# Patient Record
Sex: Male | Born: 1940
Health system: Southern US, Community
[De-identification: ages and names within clinical notes are randomized; demographics above are authoritative.]

## PROBLEM LIST (undated history)

## (undated) DIAGNOSIS — R011 Cardiac murmur, unspecified: Secondary | ICD-10-CM

## (undated) DIAGNOSIS — IMO0001 Reserved for inherently not codable concepts without codable children: Secondary | ICD-10-CM

## (undated) DIAGNOSIS — M47812 Spondylosis without myelopathy or radiculopathy, cervical region: Principal | ICD-10-CM

## (undated) DIAGNOSIS — H9190 Unspecified hearing loss, unspecified ear: Secondary | ICD-10-CM

## (undated) DIAGNOSIS — A809 Acute poliomyelitis, unspecified: Secondary | ICD-10-CM

## (undated) DIAGNOSIS — E785 Hyperlipidemia, unspecified: Secondary | ICD-10-CM

## (undated) DIAGNOSIS — R350 Frequency of micturition: Secondary | ICD-10-CM

## (undated) DIAGNOSIS — I10 Essential (primary) hypertension: Secondary | ICD-10-CM

## (undated) DIAGNOSIS — Z87442 Personal history of urinary calculi: Secondary | ICD-10-CM

## (undated) DIAGNOSIS — N400 Enlarged prostate without lower urinary tract symptoms: Secondary | ICD-10-CM

## (undated) DIAGNOSIS — M545 Low back pain: Secondary | ICD-10-CM

## (undated) DIAGNOSIS — J189 Pneumonia, unspecified organism: Secondary | ICD-10-CM

## (undated) HISTORY — DX: Low back pain: M54.5

## (undated) HISTORY — DX: Unspecified hearing loss, unspecified ear: H91.90

## (undated) HISTORY — DX: Spondylosis without myelopathy or radiculopathy, cervical region: M47.812

---

## 1998-03-06 ENCOUNTER — Observation Stay (HOSPITAL_COMMUNITY): Admission: EM | Admit: 1998-03-06 | Discharge: 1998-03-06 | Payer: Self-pay | Admitting: Emergency Medicine

## 1998-03-08 ENCOUNTER — Ambulatory Visit (HOSPITAL_COMMUNITY): Admission: RE | Admit: 1998-03-08 | Discharge: 1998-03-08 | Payer: Self-pay | Admitting: Urology

## 1998-06-20 ENCOUNTER — Ambulatory Visit (HOSPITAL_COMMUNITY): Admission: RE | Admit: 1998-06-20 | Discharge: 1998-06-20 | Payer: Self-pay | Admitting: Family Medicine

## 1999-07-09 ENCOUNTER — Encounter: Payer: Self-pay | Admitting: Family Medicine

## 1999-07-09 ENCOUNTER — Ambulatory Visit (HOSPITAL_COMMUNITY): Admission: RE | Admit: 1999-07-09 | Discharge: 1999-07-09 | Payer: Self-pay | Admitting: Family Medicine

## 2000-07-02 ENCOUNTER — Encounter: Admission: RE | Admit: 2000-07-02 | Discharge: 2000-07-02 | Payer: Self-pay | Admitting: Urology

## 2000-07-02 ENCOUNTER — Encounter: Payer: Self-pay | Admitting: Urology

## 2000-12-17 ENCOUNTER — Encounter: Admission: RE | Admit: 2000-12-17 | Discharge: 2000-12-17 | Payer: Self-pay | Admitting: *Deleted

## 2000-12-17 ENCOUNTER — Encounter: Payer: Self-pay | Admitting: *Deleted

## 2002-04-21 ENCOUNTER — Encounter: Payer: Self-pay | Admitting: Family Medicine

## 2002-04-21 ENCOUNTER — Ambulatory Visit (HOSPITAL_COMMUNITY): Admission: RE | Admit: 2002-04-21 | Discharge: 2002-04-21 | Payer: Self-pay | Admitting: Family Medicine

## 2002-05-02 ENCOUNTER — Ambulatory Visit (HOSPITAL_COMMUNITY): Admission: RE | Admit: 2002-05-02 | Discharge: 2002-05-02 | Payer: Self-pay | Admitting: Family Medicine

## 2002-05-02 ENCOUNTER — Encounter: Payer: Self-pay | Admitting: Family Medicine

## 2004-02-25 ENCOUNTER — Emergency Department (HOSPITAL_COMMUNITY): Admission: EM | Admit: 2004-02-25 | Discharge: 2004-02-25 | Payer: Self-pay | Admitting: Emergency Medicine

## 2004-02-28 ENCOUNTER — Emergency Department (HOSPITAL_COMMUNITY): Admission: EM | Admit: 2004-02-28 | Discharge: 2004-02-28 | Payer: Self-pay | Admitting: Family Medicine

## 2006-04-29 ENCOUNTER — Encounter: Admission: RE | Admit: 2006-04-29 | Discharge: 2006-04-29 | Payer: Self-pay | Admitting: Family Medicine

## 2007-04-28 ENCOUNTER — Encounter (INDEPENDENT_AMBULATORY_CARE_PROVIDER_SITE_OTHER): Payer: Self-pay | Admitting: General Surgery

## 2007-04-28 ENCOUNTER — Ambulatory Visit (HOSPITAL_BASED_OUTPATIENT_CLINIC_OR_DEPARTMENT_OTHER): Admission: RE | Admit: 2007-04-28 | Discharge: 2007-04-28 | Payer: Self-pay | Admitting: General Surgery

## 2008-12-04 ENCOUNTER — Encounter: Admission: RE | Admit: 2008-12-04 | Discharge: 2008-12-04 | Payer: Self-pay | Admitting: Neurology

## 2010-07-15 ENCOUNTER — Encounter: Admission: RE | Admit: 2010-07-15 | Discharge: 2010-07-15 | Payer: Self-pay | Admitting: Orthopedic Surgery

## 2010-09-10 ENCOUNTER — Emergency Department (HOSPITAL_COMMUNITY)
Admission: EM | Admit: 2010-09-10 | Discharge: 2010-09-10 | Payer: Self-pay | Source: Home / Self Care | Admitting: Family Medicine

## 2010-11-24 ENCOUNTER — Encounter: Payer: Self-pay | Admitting: Neurology

## 2011-01-14 LAB — POCT URINALYSIS DIPSTICK
Bilirubin Urine: NEGATIVE
Glucose, UA: NEGATIVE mg/dL
Hgb urine dipstick: NEGATIVE
Ketones, ur: NEGATIVE mg/dL
Nitrite: NEGATIVE
Protein, ur: NEGATIVE mg/dL
Specific Gravity, Urine: 1.015 (ref 1.005–1.030)
Urobilinogen, UA: 0.2 mg/dL (ref 0.0–1.0)
pH: 5 (ref 5.0–8.0)

## 2011-02-14 ENCOUNTER — Inpatient Hospital Stay (INDEPENDENT_AMBULATORY_CARE_PROVIDER_SITE_OTHER)
Admission: RE | Admit: 2011-02-14 | Discharge: 2011-02-14 | Disposition: A | Discharge status: Home / Self Care | Payer: MEDICARE | Source: Ambulatory Visit | Attending: Emergency Medicine | Admitting: Emergency Medicine

## 2011-02-14 DIAGNOSIS — J069 Acute upper respiratory infection, unspecified: Secondary | ICD-10-CM

## 2011-03-21 ENCOUNTER — Inpatient Hospital Stay (INDEPENDENT_AMBULATORY_CARE_PROVIDER_SITE_OTHER)
Admission: RE | Admit: 2011-03-21 | Discharge: 2011-03-21 | Disposition: A | Discharge status: Home / Self Care | Payer: MEDICARE | Source: Ambulatory Visit | Attending: Family Medicine | Admitting: Family Medicine

## 2011-03-21 ENCOUNTER — Ambulatory Visit (INDEPENDENT_AMBULATORY_CARE_PROVIDER_SITE_OTHER): Payer: MEDICARE

## 2011-03-21 DIAGNOSIS — M79609 Pain in unspecified limb: Secondary | ICD-10-CM

## 2011-03-21 LAB — CBC
HCT: 40.9 % (ref 39.0–52.0)
Hemoglobin: 14 g/dL (ref 13.0–17.0)
MCH: 33.3 pg (ref 26.0–34.0)
MCHC: 34.2 g/dL (ref 30.0–36.0)
MCV: 97.4 fL (ref 78.0–100.0)
Platelets: 131 10*3/uL — ABNORMAL LOW (ref 150–400)
RBC: 4.2 MIL/uL — ABNORMAL LOW (ref 4.22–5.81)
RDW: 12.7 % (ref 11.5–15.5)
WBC: 5.1 10*3/uL (ref 4.0–10.5)

## 2011-03-21 LAB — DIFFERENTIAL
Basophils Absolute: 0 10*3/uL (ref 0.0–0.1)
Basophils Relative: 0 % (ref 0–1)
Eosinophils Absolute: 0.1 10*3/uL (ref 0.0–0.7)
Eosinophils Relative: 2 % (ref 0–5)
Lymphocytes Relative: 27 % (ref 12–46)
Lymphs Abs: 1.4 10*3/uL (ref 0.7–4.0)
Monocytes Absolute: 0.4 10*3/uL (ref 0.1–1.0)
Monocytes Relative: 8 % (ref 3–12)
Neutro Abs: 3.2 10*3/uL (ref 1.7–7.7)
Neutrophils Relative %: 63 % (ref 43–77)

## 2011-03-21 LAB — C-REACTIVE PROTEIN: CRP: 0.1 mg/dL — ABNORMAL LOW (ref <0.6)

## 2011-03-21 LAB — SEDIMENTATION RATE: Sed Rate: 2 mm/hr (ref 0–16)

## 2011-03-28 ENCOUNTER — Other Ambulatory Visit: Payer: Self-pay | Admitting: Sports Medicine

## 2011-03-28 DIAGNOSIS — M79671 Pain in right foot: Secondary | ICD-10-CM

## 2011-04-01 ENCOUNTER — Ambulatory Visit
Admission: RE | Admit: 2011-04-01 | Discharge: 2011-04-01 | Disposition: A | Discharge status: Home / Self Care | Payer: MEDICARE | Source: Ambulatory Visit | Attending: Sports Medicine | Admitting: Sports Medicine

## 2011-04-01 DIAGNOSIS — M79671 Pain in right foot: Secondary | ICD-10-CM

## 2011-04-01 MED ORDER — GADOBENATE DIMEGLUMINE 529 MG/ML IV SOLN: 10.0000 mL | Freq: Once | INTRAVENOUS | Status: AC | PRN

## 2011-06-24 ENCOUNTER — Other Ambulatory Visit: Payer: Self-pay | Admitting: Dermatology

## 2012-01-01 ENCOUNTER — Ambulatory Visit: Payer: Medicare Other | Admitting: Family Medicine

## 2012-07-06 ENCOUNTER — Encounter (HOSPITAL_COMMUNITY): Payer: Self-pay | Admitting: *Deleted

## 2012-07-06 ENCOUNTER — Encounter (HOSPITAL_COMMUNITY): Payer: Self-pay | Admitting: Emergency Medicine

## 2012-07-06 ENCOUNTER — Emergency Department (HOSPITAL_COMMUNITY): Payer: Medicare Other

## 2012-07-06 ENCOUNTER — Emergency Department (HOSPITAL_COMMUNITY)
Admission: EM | Admit: 2012-07-06 | Discharge: 2012-07-07 | Disposition: A | Discharge status: Home / Self Care | Payer: Medicare Other | Attending: Emergency Medicine | Admitting: Emergency Medicine

## 2012-07-06 ENCOUNTER — Emergency Department (INDEPENDENT_AMBULATORY_CARE_PROVIDER_SITE_OTHER)
Admission: EM | Admit: 2012-07-06 | Discharge: 2012-07-06 | Disposition: A | Discharge status: Other Acute Inpatient Hospital | Payer: Medicare Other | Source: Home / Self Care | Attending: Family Medicine | Admitting: Family Medicine

## 2012-07-06 DIAGNOSIS — E119 Type 2 diabetes mellitus without complications: Secondary | ICD-10-CM | POA: Insufficient documentation

## 2012-07-06 DIAGNOSIS — M79609 Pain in unspecified limb: Secondary | ICD-10-CM | POA: Insufficient documentation

## 2012-07-06 DIAGNOSIS — I10 Essential (primary) hypertension: Secondary | ICD-10-CM | POA: Insufficient documentation

## 2012-07-06 DIAGNOSIS — M5412 Radiculopathy, cervical region: Secondary | ICD-10-CM

## 2012-07-06 DIAGNOSIS — R079 Chest pain, unspecified: Secondary | ICD-10-CM | POA: Insufficient documentation

## 2012-07-06 DIAGNOSIS — E785 Hyperlipidemia, unspecified: Secondary | ICD-10-CM | POA: Insufficient documentation

## 2012-07-06 DIAGNOSIS — Z79899 Other long term (current) drug therapy: Secondary | ICD-10-CM | POA: Insufficient documentation

## 2012-07-06 HISTORY — DX: Benign prostatic hyperplasia without lower urinary tract symptoms: N40.0

## 2012-07-06 HISTORY — DX: Hyperlipidemia, unspecified: E78.5

## 2012-07-06 HISTORY — DX: Essential (primary) hypertension: I10

## 2012-07-06 LAB — CBC
HCT: 38.2 % — ABNORMAL LOW (ref 39.0–52.0)
Hemoglobin: 13 g/dL (ref 13.0–17.0)
MCH: 33.3 pg (ref 26.0–34.0)
MCHC: 34 g/dL (ref 30.0–36.0)
MCV: 97.9 fL (ref 78.0–100.0)
Platelets: 150 10*3/uL (ref 150–400)
RBC: 3.9 MIL/uL — ABNORMAL LOW (ref 4.22–5.81)
RDW: 12.8 % (ref 11.5–15.5)
WBC: 4.3 10*3/uL (ref 4.0–10.5)

## 2012-07-06 LAB — BASIC METABOLIC PANEL
BUN: 22 mg/dL (ref 6–23)
CO2: 27 mEq/L (ref 19–32)
Calcium: 9.5 mg/dL (ref 8.4–10.5)
Chloride: 104 mEq/L (ref 96–112)
Creatinine, Ser: 1.21 mg/dL (ref 0.50–1.35)
GFR calc Af Amer: 68 mL/min — ABNORMAL LOW (ref >90)
GFR calc non Af Amer: 58 mL/min — ABNORMAL LOW (ref >90)
Glucose, Bld: 137 mg/dL — ABNORMAL HIGH (ref 70–99)
Potassium: 4.1 mEq/L (ref 3.5–5.1)
Sodium: 141 mEq/L (ref 135–145)

## 2012-07-06 LAB — POCT I-STAT TROPONIN I: Troponin i, poc: 0 ng/mL (ref 0.00–0.08)

## 2012-07-06 MED ORDER — DEXAMETHASONE 6 MG PO TABS
6.0000 mg | ORAL_TABLET | Freq: Once | ORAL | Status: AC
  Administered 2012-07-06: 6 mg via ORAL
  Filled 2012-07-06: qty 1

## 2012-07-06 MED ORDER — OXYCODONE-ACETAMINOPHEN 5-325 MG PO TABS: 1.0000 | ORAL_TABLET | ORAL | Status: AC | PRN

## 2012-07-06 MED ORDER — KETOROLAC TROMETHAMINE 60 MG/2ML IM SOLN
60.0000 mg | Freq: Once | INTRAMUSCULAR | Status: AC
  Administered 2012-07-06: 60 mg via INTRAMUSCULAR
  Filled 2012-07-06: qty 2

## 2012-07-06 MED ORDER — OXYCODONE-ACETAMINOPHEN 5-325 MG PO TABS
2.0000 | ORAL_TABLET | Freq: Once | ORAL | Status: AC
  Administered 2012-07-06: 2 via ORAL
  Filled 2012-07-06: qty 2

## 2012-07-06 MED ORDER — IBUPROFEN 600 MG PO TABS: 600.0000 mg | ORAL_TABLET | Freq: Three times a day (TID) | ORAL | Status: AC | PRN

## 2012-07-06 NOTE — ED Provider Notes (Signed)
History     CSN: [id number]  Arrival date & time 07/06/12  1721   First MD Initiated Contact with Patient 07/06/12 2128      Chief Complaint  Patient presents with  . Chest Pain  . Arm Pain    (Consider location/radiation/quality/duration/timing/severity/associated sxs/prior treatment) The history is provided by the patient.   the patient reports a constant pain in the base of his left arm radiating up his left arm and his left shoulder blade.  His pain is been constant for 2 weeks.  He reports having a history of arthritis in his neck he reports that his neurologist said he had a "pinched nerve".  He's been taking tramadol for his pain without relief.  He presents because of worsening pain radiating down his left arm.  He has no shortness of breath.  He denies fevers or chills.  He has a long-standing history of neck pain but reports no worsening of his neck pain over the past several days.  He has had no fevers or chills.  He denies lower extremity symptoms.  He denies abdominal pain nausea vomiting diarrhea.  Past Medical History  Diagnosis Date  . Hypertension   . Diabetes mellitus   . Hyperlipemia   . Kidney stones   . Enlarged prostate     History reviewed. No pertinent past surgical history.  History reviewed. No pertinent family history.  History  Substance Use Topics  . Smoking status: Never Smoker   . Smokeless tobacco: Not on file  . Alcohol Use: No      Review of Systems  All other systems reviewed and are negative.    Allergies  Penicillins  Home Medications   Current Outpatient Rx  Name Route Sig Dispense Refill  . ALFUZOSIN HCL ER 10 MG PO TB24 Oral Take 10 mg by mouth daily.    [name] AMLODIPINE BESYLATE 10 MG PO TABS Oral Take 10 mg by mouth daily.    . ASPIRIN 81 MG PO TABS Oral Take 81 mg by mouth daily.    [name] LISINOPRIL-HYDROCHLOROTHIAZIDE 10-12.5 MG PO TABS Oral Take 1 tablet by mouth daily.    [name] SIMVASTATIN 20 MG PO TABS Oral Take 20 mg by mouth  every evening.    [name] TRAMADOL HCL 50 MG PO TABS Oral Take 50 mg by mouth every 6 (six) hours as needed. For pain    . IBUPROFEN 600 MG PO TABS Oral Take 1 tablet (600 mg total) by mouth every 8 (eight) hours as needed for pain. 15 tablet 0  . OXYCODONE-ACETAMINOPHEN 5-325 MG PO TABS Oral Take 1 tablet by mouth every 4 (four) hours as needed for pain. 25 tablet 0    BP 147/63  Pulse 60  Temp 98 F (36.7 C) (Oral)  Resp 15  SpO2 99%  Physical Exam  Nursing note and vitals reviewed. Constitutional: He is oriented to person, place, and time. He appears well-developed and well-nourished.  HENT:  Head: Normocephalic and atraumatic.  Eyes: EOM are normal.  Neck: Normal range of motion.       No cervical spine tenderness  Cardiovascular: Normal rate, regular rhythm, normal heart sounds and intact distal pulses.   Pulmonary/Chest: Effort normal and breath sounds normal. No respiratory distress.  Abdominal: Soft. He exhibits no distension. There is no tenderness.  Musculoskeletal: Normal range of motion.       5 out of 5 strength in bilateral upper extremity major muscle groups.  Neurological: He is alert and oriented  to person, place, and time.  Skin: Skin is warm and dry.  Psychiatric: He has a normal mood and affect. Judgment normal.    ED Course  Procedures (including critical care time)   Date: 07/06/2012  Rate: 63  Rhythm: normal sinus rhythm  QRS Axis: normal  Intervals: normal  ST/T Wave abnormalities: normal  Conduction Disutrbances: none  Narrative Interpretation:   Old EKG Reviewed: No significant changes noted     Labs Reviewed  CBC - Abnormal; Notable for the following:    RBC 3.90 (*)     HCT 38.2 (*)     All other components within normal limits  BASIC METABOLIC PANEL - Abnormal; Notable for the following:    Glucose, Bld 137 (*)     GFR calc non Af Amer 58 (*)     GFR calc Af Amer 68 (*)     All other components within normal limits  POCT I-STAT TROPONIN  I   Dg Chest 2 View  07/06/2012  *RADIOLOGY REPORT*  Clinical Data: Shortness of breath for 2 weeks.  Left upper extremity pain.  CHEST - 2 VIEW  Comparison: Chest and rib radiographs 09/10/2010.  Findings: The heart size and mediastinal contours are stable. There is mild chronic lung disease which appears stable.  There is a possible granuloma or bone island superimposed over the anterior aspect of the right fourth rib, unchanged.  No pleural effusion or pneumothorax is seen.  IMPRESSION: Stable chest with mild chronic lung disease.  No active cardiopulmonary process.   Original Report Authenticated By: Gerrianne Scale, M.D.     I personally reviewed the imaging tests through PACS system  I reviewed available ER/hospitalization records thought the EMR   1. Cervical radiculopathy       MDM  The patient has what appears to be a cervical radiculopathy with pain across his left shoulder and down his left arm.  His pain is been constant for 2 weeks.  This is not ACS        Lyanne Co, MD 07/06/12 2354

## 2012-07-06 NOTE — ED Notes (Signed)
Rx x 2, pt voiced understanding to f/u with PCP and neurosurgeon and return for worsening condition.

## 2012-07-06 NOTE — ED Notes (Signed)
Pt sent from [facility] for pain in left thumb, elbow, shoulder and into left chest; pt denies CP at present but sts pain in left arm; pt sts pain started x 2 weeks ago

## 2012-07-06 NOTE — ED Notes (Signed)
Pt reports chest pain for the past 2 weeks that has progressively gotten worse. States that is started with pain in thumb, then left arm and neck. Pt is slightly diaphoretic currently, denies n/v/d, shortness of breathe (past year). Denies previous hx of cardiac problems  - hx of htn, high cholesterol.

## 2012-07-06 NOTE — ED Notes (Signed)
Pt states that 3 weeks ago, left thumb began hurting.  3 days ago, pain spread to left shoulder and chest.  States pain worsens with walking.  Now, states pain hurts worst in his shoulder. No SOB, n/v, diaphoresis.  Pt alert and oriented, ambulatory.

## 2012-07-06 NOTE — ED Provider Notes (Signed)
History     CSN: [id number]  Arrival date & time 07/06/12  1601   First MD Initiated Contact with Patient 07/06/12 1626      Chief Complaint  Patient presents with  . Chest Pain    (Consider location/radiation/quality/duration/timing/severity/associated sxs/prior treatment) Patient is a 71 y.o. male presenting with chest pain. The history is provided by the patient.  Chest Pain Pertinent negatives for primary symptoms include no palpitations.   James Sweeney is a 71 y.o. male who complains of left arm pain with no known injury for 2 weeks, 3 days ago he began having left chest pain. Episode Length: intermittent, has not changed in nature. At its most intense, the pain is at 3/10. The pain is currently at 0/10. The severity of the pain is described as mild. The quality of the pain is described as dull ache. Exacerbated by: nothing. worse at night.    Associated symptoms: fatigue. He has tried nothing for the symptoms. Past medical history comments: HTN, high cholesterol.. Procedure history comments: none contributory.  Works as a Curator.    Past Medical History  Diagnosis Date  . Hypertension   . Diabetes mellitus   . Hyperlipemia   . Kidney stones     History reviewed. No pertinent past surgical history.  Family History  Problem Relation Age of Onset  . Family history unknown: Yes    History  Substance Use Topics  . Smoking status: Never Smoker   . Smokeless tobacco: Not on file  . Alcohol Use: No      Review of Systems  Constitutional: Negative.   Respiratory: Negative.   Cardiovascular: Positive for chest pain. Negative for palpitations and leg swelling.  Musculoskeletal:       Left arm pain    Allergies  Penicillins  Home Medications   Current Outpatient Rx  Name Route Sig Dispense Refill  . ALFUZOSIN HCL ER 10 MG PO TB24 Oral Take 10 mg by mouth daily.    [name] AMLODIPINE BESYLATE 10 MG PO TABS Oral Take 10 mg by mouth daily.    . ASPIRIN 81 MG PO TABS  Oral Take 81 mg by mouth daily.    [name] LISINOPRIL-HYDROCHLOROTHIAZIDE 10-12.5 MG PO TABS Oral Take 1 tablet by mouth daily.    [name] SIMVASTATIN 20 MG PO TABS Oral Take 20 mg by mouth every evening.    [name] TRAMADOL HCL 50 MG PO TABS Oral Take 50 mg by mouth every 6 (six) hours as needed.      BP 147/69  Pulse 62  Temp 98.2 F (36.8 C) (Oral)  Resp 18  SpO2 96%  Physical Exam  Nursing note and vitals reviewed. Constitutional: He is oriented to person, place, and time. Vital signs are normal. He appears well-developed and well-nourished. He is active and cooperative.  HENT:  Head: Normocephalic.  Eyes: Conjunctivae are normal. Pupils are equal, round, and reactive to light. No scleral icterus.  Neck: Trachea normal. Neck supple.  Cardiovascular: Normal rate and regular rhythm.   Pulmonary/Chest: Effort normal and breath sounds normal.  Musculoskeletal:       Mid elbow pain with palpation.  Neurological: He is alert and oriented to person, place, and time. No cranial nerve deficit or sensory deficit.  Skin: Skin is warm and dry.  Psychiatric: He has a normal mood and affect. His speech is normal and behavior is normal. Judgment and thought content normal. Cognition and memory are normal.    ED Course  Procedures (including  critical care time)  Labs Reviewed - No data to display No results found.   1. Chest pain       MDM  Send to [facility] for cardiac etiology rule out of chest pain.        Johnsie Kindred, NP 07/06/12 1658

## 2012-07-07 NOTE — ED Provider Notes (Signed)
Medical screening examination/treatment/procedure(s) were performed by resident physician or non-physician practitioner and as supervising physician I was immediately available for consultation/collaboration.   Barkley Bruns MD.    Linna Hoff, MD 07/07/12 2132

## 2012-11-03 HISTORY — PX: CATARACT EXTRACTION: SUR2

## 2013-07-29 ENCOUNTER — Other Ambulatory Visit: Payer: Self-pay | Admitting: Neurosurgery

## 2013-07-29 DIAGNOSIS — M4712 Other spondylosis with myelopathy, cervical region: Secondary | ICD-10-CM

## 2013-08-10 ENCOUNTER — Ambulatory Visit
Admission: RE | Admit: 2013-08-10 | Discharge: 2013-08-10 | Disposition: A | Discharge status: Home / Self Care | Payer: Medicare Other | Source: Ambulatory Visit | Attending: Neurosurgery | Admitting: Neurosurgery

## 2013-08-10 DIAGNOSIS — M4712 Other spondylosis with myelopathy, cervical region: Secondary | ICD-10-CM

## 2013-09-02 ENCOUNTER — Telehealth: Payer: Self-pay | Admitting: Neurology

## 2013-09-02 ENCOUNTER — Telehealth: Payer: Self-pay | Admitting: *Deleted

## 2013-09-02 MED ORDER — TRAMADOL HCL 50 MG PO TABS: ORAL_TABLET | ORAL | Status: DC

## 2013-09-02 NOTE — Telephone Encounter (Signed)
Pts wife calling about refill tramadol [facility] [phone] 90 day supply.  Last seen Dr. Sandria Manly 11-24-12 for peripheral neuropathy. F/u in one yr or when necessary per note.

## 2013-09-02 NOTE — Telephone Encounter (Signed)
I will call in a prescription. 

## 2013-09-19 NOTE — Telephone Encounter (Signed)
I have not had access to Epic for over 2 weeks.  By viewing the chart, this has already been taken care of.  

## 2013-09-28 ENCOUNTER — Telehealth: Payer: Self-pay | Admitting: Neurology

## 2013-09-28 NOTE — Telephone Encounter (Signed)
called former Dr Love patient to schedule appt didn't get and answer. °

## 2013-11-22 ENCOUNTER — Telehealth: Payer: Self-pay | Admitting: Neurology

## 2013-11-22 ENCOUNTER — Other Ambulatory Visit: Payer: Self-pay

## 2013-11-22 MED ORDER — TRAMADOL HCL 50 MG PO TABS: ORAL_TABLET | ORAL | Status: DC

## 2013-11-22 NOTE — Telephone Encounter (Signed)
Prior Dr. Erling Cruz patient who walked into the lobby requesting to set up with a new provider. He needs refills of his Tramadol HCl [facility] [address].  Please call to advise.

## 2013-11-22 NOTE — Telephone Encounter (Signed)
Rx signed and faxed.

## 2013-11-22 NOTE — Telephone Encounter (Signed)
Former Love patient pending assignment.  Could you please auth one refill?  Thank you.

## 2013-11-22 NOTE — Telephone Encounter (Signed)
Request has been sent to Dr Jannifer Franklin regarding refill.  (He auth one refill previously in Epic).  Patient is pending assignment/appt with a new physician.  A message has been sent from the front desk to Triage regarding this.

## 2013-11-28 ENCOUNTER — Encounter: Payer: Self-pay | Admitting: *Deleted

## 2013-11-28 ENCOUNTER — Telehealth: Payer: Self-pay | Admitting: Neurology

## 2013-11-28 MED ORDER — TRAMADOL HCL 50 MG PO TABS: ORAL_TABLET | ORAL | Status: DC

## 2013-11-28 NOTE — Telephone Encounter (Signed)
Patient has been reassigned, scheduled and confirmed appt.

## 2013-11-28 NOTE — Telephone Encounter (Signed)
Patient's wife calling to state that patient is prior Dr. Erling Cruz patient and needs to be reassigned to new doctor and given an appointment. Patient's wife following up since they have not heard anything back yet. Please call and advise.

## 2013-11-28 NOTE — Telephone Encounter (Signed)
This patient will be seen in office in the next 2 or 3 weeks. This is a prior doctor Love patient. The patient needs a refill on his Ultram, this prescription was written on 11/22/2013, but the prescription likely was sent to the wrong pharmacy. I changed the pharmacy in the computer. The prescription was rewritten.

## 2013-11-28 NOTE — Progress Notes (Signed)
This encounter was created in error - please disregard.

## 2013-11-28 NOTE — Telephone Encounter (Signed)
I had reassigned this pt to Dr. Jannifer Franklin back in 09-02-13.

## 2013-11-28 NOTE — Telephone Encounter (Signed)
Sent to [facility] for reassignment 

## 2013-12-01 ENCOUNTER — Ambulatory Visit (INDEPENDENT_AMBULATORY_CARE_PROVIDER_SITE_OTHER): Payer: Medicare HMO | Admitting: Neurology

## 2013-12-01 ENCOUNTER — Telehealth: Payer: Self-pay | Admitting: Neurology

## 2013-12-01 ENCOUNTER — Encounter: Payer: Self-pay | Admitting: Neurology

## 2013-12-01 VITALS — BP 130/55 | HR 65 | Wt 190.0 lb

## 2013-12-01 DIAGNOSIS — M545 Low back pain, unspecified: Secondary | ICD-10-CM

## 2013-12-01 DIAGNOSIS — M47812 Spondylosis without myelopathy or radiculopathy, cervical region: Secondary | ICD-10-CM | POA: Insufficient documentation

## 2013-12-01 HISTORY — DX: Spondylosis without myelopathy or radiculopathy, cervical region: M47.812

## 2013-12-01 HISTORY — DX: Low back pain, unspecified: M54.50

## 2013-12-01 NOTE — Telephone Encounter (Signed)
A 90 day Rx was already sent to [facility] on 01/26.  I called the pharmacy.  Spoke with [employer].  He said they do have the Rx, and are in the process of filling it now.  They will contact the patient when it's ready for pick up.

## 2013-12-01 NOTE — Progress Notes (Signed)
Reason for visit: Low back pain  James Sweeney is an 73 y.o. male  History of present illness:  James Sweeney is a 73 year old right-handed white male with a history of chronic neck and low back pain. The patient indicates that he underwent physical therapy for his neck, and this significantly improved his issues. The patient is quite active, and he manages a farm. The patient also has chronic low back pain, but he indicates that the pain does not radiate down either leg. The patient denies any weakness or numbness in the extremities, and he denies any significant problems with a gait disorder. The patient denies problems controlling the bowels or the bladder. The patient has a problem with decreased hearing, and he got hearing aids, but this did not help him. The patient returns to the office today for an evaluation. The patient will take a half of a 50 mg tablet intermittently, and this helps his pain significantly. The patient is currently satisfied with his current medical condition, and he denies any new medical issues that have come up since last seen.  Past Medical History  Diagnosis Date  . Hypertension   . Diabetes mellitus   . Hyperlipemia   . Kidney stones   . Enlarged prostate   . Cervical spondylosis without myelopathy 12/01/2013  . Lumbago 12/01/2013  . Hearing deficit     Past Surgical History  Procedure Laterality Date  . Cataract extraction Bilateral     Family History  Problem Relation Age of Onset  . Cancer Mother     Stomach  . Cancer - Lung Father   . Heart attack Brother     Social history:  reports that he quit smoking about 15 years ago. He has never used smokeless tobacco. He reports that he does not drink alcohol or use illicit drugs.    Allergies  Allergen Reactions  . Penicillins     Medications:  Current Outpatient Prescriptions on File Prior to Visit  Medication Sig Dispense Refill  . alfuzosin (UROXATRAL) 10 MG 24 hr tablet Take 10 mg by  mouth daily.      [name] amLODipine (NORVASC) 10 MG tablet Take 10 mg by mouth daily.      [name] aspirin 81 MG tablet Take 81 mg by mouth daily.      [name] lisinopril-hydrochlorothiazide (PRINZIDE,ZESTORETIC) 10-12.5 MG per tablet Take 1 tablet by mouth daily.      . simvastatin (ZOCOR) 20 MG tablet Take 20 mg by mouth every evening.      . traMADol (ULTRAM) 50 MG tablet One half tablet twice daily  90 tablet  0   No current facility-administered medications on file prior to visit.    ROS:  Out of a complete 14 system review of symptoms, the patient complains only of the following symptoms, and all other reviewed systems are negative.  Food allergies Neck pain, back pain Bruising easily  Blood pressure 130/55, pulse 65, weight 190 lb (86.183 kg).  Physical Exam  General: The patient is alert and cooperative at the time of the examination.  Neuromuscular: The patient lacks 15 of full lateral rotation of the cervical spine bilaterally. The patient has near-normal range of movement of the low back.  Skin: No significant peripheral edema is noted.   Neurologic Exam  Mental status: The patient is oriented x 3.  Cranial nerves: Facial symmetry is present. Speech is normal, no aphasia or dysarthria is noted. Extraocular movements are full. Visual fields are full.  Motor: The patient has good strength in all 4 extremities.  Sensory examination: Soft touch sensation is symmetric on the face, arms, and legs.  Coordination: The patient has good finger-nose-finger and heel-to-shin bilaterally.  Gait and station: The patient has a normal gait. Tandem gait is normal. Romberg is negative. No drift is seen.  Reflexes: Deep tendon reflexes are symmetric.   Assessment/Plan:  1. Cervical spondylosis  2. Lumbosacral spondylosis  The patient has some issues with chronic low back pain there are manageable with low dose Ultram. The patient will continue the medication for now, and he will followup  in one year. The patient overall is doing well.  Jill Alexanders MD 12/01/2013 7:42 PM  [facility] [address] [place], [place] [address]  Phone [phone] Fax [phone]

## 2013-12-01 NOTE — Patient Instructions (Signed)
Back Pain, Adult Low back pain is very common. About 1 in 5 people have back pain.The cause of low back pain is rarely dangerous. The pain often gets better over time.About half of people with a sudden onset of back pain feel better in just 2 weeks. About 8 in 10 people feel better by 6 weeks.  CAUSES Some common causes of back pain include:  Strain of the muscles or ligaments supporting the spine.  Wear and tear (degeneration) of the spinal discs.  Arthritis.  Direct injury to the back. DIAGNOSIS Most of the time, the direct cause of low back pain is not known.However, back pain can be treated effectively even when the exact cause of the pain is unknown.Answering your caregiver's questions about your overall health and symptoms is one of the most accurate ways to make sure the cause of your pain is not dangerous. If your caregiver needs more information, he or she may order lab work or imaging tests (X-rays or MRIs).However, even if imaging tests show changes in your back, this usually does not require surgery. HOME CARE INSTRUCTIONS For many people, back pain returns.Since low back pain is rarely dangerous, it is often a condition that people can learn to manageon their own.   Remain active. It is stressful on the back to sit or stand in one place. Do not sit, drive, or stand in one place for more than 30 minutes at a time. Take short walks on level surfaces as soon as pain allows.Try to increase the length of time you walk each day.  Do not stay in bed.Resting more than 1 or 2 days can delay your recovery.  Do not avoid exercise or work.Your body is made to move.It is not dangerous to be active, even though your back may hurt.Your back will likely heal faster if you return to being active before your pain is gone.  Pay attention to your body when you bend and lift. Many people have less discomfortwhen lifting if they bend their knees, keep the load close to their bodies,and  avoid twisting. Often, the most comfortable positions are those that put less stress on your recovering back.  Find a comfortable position to sleep. Use a firm mattress and lie on your side with your knees slightly bent. If you lie on your back, put a pillow under your knees.  Only take over-the-counter or prescription medicines as directed by your caregiver. Over-the-counter medicines to reduce pain and inflammation are often the most helpful.Your caregiver may prescribe muscle relaxant drugs.These medicines help dull your pain so you can more quickly return to your normal activities and healthy exercise.  Put ice on the injured area.  Put ice in a plastic bag.  Place a towel between your skin and the bag.  Leave the ice on for 15-20 minutes, 03-04 times a day for the first 2 to 3 days. After that, ice and heat may be alternated to reduce pain and spasms.  Ask your caregiver about trying back exercises and gentle massage. This may be of some benefit.  Avoid feeling anxious or stressed.Stress increases muscle tension and can worsen back pain.It is important to recognize when you are anxious or stressed and learn ways to manage it.Exercise is a great option. SEEK MEDICAL CARE IF:  You have pain that is not relieved with rest or medicine.  You have pain that does not improve in 1 week.  You have new symptoms.  You are generally not feeling well. SEEK   IMMEDIATE MEDICAL CARE IF:   You have pain that radiates from your back into your legs.  You develop new bowel or bladder control problems.  You have unusual weakness or numbness in your arms or legs.  You develop nausea or vomiting.  You develop abdominal pain.  You feel faint. Document Released: 10/20/2005 Document Revised: 04/20/2012 Document Reviewed: 03/10/2011 Exit[employer] Patient Information 2014 [employer].  

## 2013-12-01 NOTE — Telephone Encounter (Signed)
Patient needed Rx of Tramadol 90-day supply called to [facility] on [address] - [phone].

## 2014-04-03 ENCOUNTER — Other Ambulatory Visit: Payer: Self-pay | Admitting: Neurology

## 2014-04-03 NOTE — Telephone Encounter (Signed)
Dr Willis is out of the office.  Forwarding request to [facility] for approval.  

## 2014-04-05 ENCOUNTER — Other Ambulatory Visit: Payer: Self-pay

## 2014-04-05 ENCOUNTER — Telehealth: Payer: Self-pay | Admitting: Neurology

## 2014-04-05 MED ORDER — TRAMADOL HCL 50 MG PO TABS: ORAL_TABLET | ORAL | Status: DC

## 2014-04-05 NOTE — Telephone Encounter (Signed)
The Rx was faxed to the pharmacy.  I called the pharmacy to see if there were any issues with the prescription.  The pharmacist said the Rx is ready for pick up, there are no problems.  I called the patient back.  He is aware.

## 2014-04-05 NOTE — Telephone Encounter (Signed)
Rx signed and faxed.

## 2014-04-05 NOTE — Telephone Encounter (Signed)
Patient came to the office today stating [facility] would not refill his tramadol. Patient would like a call back from a nurse regarding this. Please call to advise.

## 2014-05-19 ENCOUNTER — Ambulatory Visit
Admission: RE | Admit: 2014-05-19 | Discharge: 2014-05-19 | Disposition: A | Discharge status: Home / Self Care | Payer: Medicare PPO | Source: Ambulatory Visit | Attending: Family Medicine | Admitting: Family Medicine

## 2014-05-19 ENCOUNTER — Other Ambulatory Visit: Payer: Self-pay | Admitting: Family Medicine

## 2014-05-19 DIAGNOSIS — M25511 Pain in right shoulder: Secondary | ICD-10-CM

## 2014-09-20 ENCOUNTER — Encounter: Payer: Self-pay | Admitting: Neurology

## 2014-09-26 ENCOUNTER — Encounter: Payer: Self-pay | Admitting: Neurology

## 2014-11-03 DIAGNOSIS — J189 Pneumonia, unspecified organism: Secondary | ICD-10-CM

## 2014-11-03 HISTORY — DX: Pneumonia, unspecified organism: J18.9

## 2014-12-01 ENCOUNTER — Telehealth: Payer: Self-pay | Admitting: Neurology

## 2014-12-01 ENCOUNTER — Encounter: Payer: Self-pay | Admitting: Neurology

## 2014-12-01 ENCOUNTER — Ambulatory Visit (INDEPENDENT_AMBULATORY_CARE_PROVIDER_SITE_OTHER): Payer: Medicare HMO | Admitting: Neurology

## 2014-12-01 VITALS — BP 145/61 | HR 61 | Ht 70.0 in | Wt 199.0 lb

## 2014-12-01 DIAGNOSIS — M545 Low back pain, unspecified: Secondary | ICD-10-CM

## 2014-12-01 MED ORDER — TRAMADOL HCL 50 MG PO TABS: ORAL_TABLET | ORAL | Status: DC

## 2014-12-01 NOTE — Telephone Encounter (Addendum)
PCP has been changed in EMR to Dr. Antony Contras.

## 2014-12-01 NOTE — Patient Instructions (Signed)
Back Pain, Adult Low back pain is very common. About 1 in 5 people have back pain.The cause of low back pain is rarely dangerous. The pain often gets better over time.About half of people with a sudden onset of back pain feel better in just 2 weeks. About 8 in 10 people feel better by 6 weeks.  CAUSES Some common causes of back pain include:  Strain of the muscles or ligaments supporting the spine.  Wear and tear (degeneration) of the spinal discs.  Arthritis.  Direct injury to the back. DIAGNOSIS Most of the time, the direct cause of low back pain is not known.However, back pain can be treated effectively even when the exact cause of the pain is unknown.Answering your caregiver's questions about your overall health and symptoms is one of the most accurate ways to make sure the cause of your pain is not dangerous. If your caregiver needs more information, he or she may order lab work or imaging tests (X-rays or MRIs).However, even if imaging tests show changes in your back, this usually does not require surgery. HOME CARE INSTRUCTIONS For many people, back pain returns.Since low back pain is rarely dangerous, it is often a condition that people can learn to manageon their own.   Remain active. It is stressful on the back to sit or stand in one place. Do not sit, drive, or stand in one place for more than 30 minutes at a time. Take short walks on level surfaces as soon as pain allows.Try to increase the length of time you walk each day.  Do not stay in bed.Resting more than 1 or 2 days can delay your recovery.  Do not avoid exercise or work.Your body is made to move.It is not dangerous to be active, even though your back may hurt.Your back will likely heal faster if you return to being active before your pain is gone.  Pay attention to your body when you bend and lift. Many people have less discomfortwhen lifting if they bend their knees, keep the load close to their bodies,and  avoid twisting. Often, the most comfortable positions are those that put less stress on your recovering back.  Find a comfortable position to sleep. Use a firm mattress and lie on your side with your knees slightly bent. If you lie on your back, put a pillow under your knees.  Only take over-the-counter or prescription medicines as directed by your caregiver. Over-the-counter medicines to reduce pain and inflammation are often the most helpful.Your caregiver may prescribe muscle relaxant drugs.These medicines help dull your pain so you can more quickly return to your normal activities and healthy exercise.  Put ice on the injured area.  Put ice in a plastic bag.  Place a towel between your skin and the bag.  Leave the ice on for 15-20 minutes, 03-04 times a day for the first 2 to 3 days. After that, ice and heat may be alternated to reduce pain and spasms.  Ask your caregiver about trying back exercises and gentle massage. This may be of some benefit.  Avoid feeling anxious or stressed.Stress increases muscle tension and can worsen back pain.It is important to recognize when you are anxious or stressed and learn ways to manage it.Exercise is a great option. SEEK MEDICAL CARE IF:  You have pain that is not relieved with rest or medicine.  You have pain that does not improve in 1 week.  You have new symptoms.  You are generally not feeling well. SEEK   IMMEDIATE MEDICAL CARE IF:   You have pain that radiates from your back into your legs.  You develop new bowel or bladder control problems.  You have unusual weakness or numbness in your arms or legs.  You develop nausea or vomiting.  You develop abdominal pain.  You feel faint. Document Released: 10/20/2005 Document Revised: 04/20/2012 Document Reviewed: 02/21/2014 ExitCare Patient Information 2015 [employer]. This information is not intended to replace advice given to you by your health care provider. Make sure you  discuss any questions you have with your health care provider.  

## 2014-12-01 NOTE — Telephone Encounter (Signed)
FYI-Patient's spouse stated PCP should indicate Dr. Antony Contras at [facility].

## 2014-12-01 NOTE — Progress Notes (Signed)
Reason for visit: Low back pain  James Sweeney is an 74 y.o. male  History of present illness:  James Sweeney is a 74 year old right-handed white male with a history of chronic low back pain. The patient is doing well with low-dose Ultram taking one half tablet in the morning and one half tablet in the evening. He finds that he functions fairly well. He has a history of polio, and his right leg is slightly shorter than the left. He believes that this is the etiology of his back pain. He indicates that if he is up on his feet for long period of time, this will increase the back pain. He denies any new medical issues that have come up since last seen. The patient denies any falls, he denies any radiation of pain from the back down the legs.  Past Medical History  Diagnosis Date  . Hypertension   . Diabetes mellitus   . Hyperlipemia   . Kidney stones   . Enlarged prostate   . Cervical spondylosis without myelopathy 12/01/2013  . Lumbago 12/01/2013  . Hearing deficit     Past Surgical History  Procedure Laterality Date  . Cataract extraction Bilateral     Family History  Problem Relation Age of Onset  . Cancer Mother     Stomach  . Cancer - Lung Father   . Heart attack Brother     Social history:  reports that he quit smoking about 16 years ago. He has never used smokeless tobacco. He reports that he does not drink alcohol or use illicit drugs.    Allergies  Allergen Reactions  . Penicillins     Medications:  Current Outpatient Prescriptions on File Prior to Visit  Medication Sig Dispense Refill  . alfuzosin (UROXATRAL) 10 MG 24 hr tablet Take 10 mg by mouth daily.    [name] amLODipine (NORVASC) 10 MG tablet Take 10 mg by mouth daily.    [name] aspirin 81 MG tablet Take 81 mg by mouth daily.    [name] lisinopril-hydrochlorothiazide (PRINZIDE,ZESTORETIC) 10-12.5 MG per tablet Take 1 tablet by mouth daily.    . simvastatin (ZOCOR) 20 MG tablet Take 20 mg by mouth every evening.      No current facility-administered medications on file prior to visit.    ROS:  Out of a complete 14 system review of symptoms, the patient complains only of the following symptoms, and all other reviewed systems are negative.  Hearing loss Food allergies Back pain  Blood pressure 145/61, pulse 61, height 5\' 10"  (1.778 m), weight 199 lb (90.266 kg).  Physical Exam  General: The patient is alert and cooperative at the time of the examination.   Neuromuscular: Range of movement of the low back is relatively full.  Skin: No significant peripheral edema is noted.   Neurologic Exam  Mental status: The patient is oriented x 3.  Cranial nerves: Facial symmetry is present. Speech is normal, no aphasia or dysarthria is noted. Extraocular movements are full. Visual fields are full. The patient is hard of hearing.  Motor: The patient has good strength in all 4 extremities.  Sensory examination: Soft touch sensation is symmetric on the face, arms, and legs.  Coordination: The patient has good finger-nose-finger and heel-to-shin bilaterally.  Gait and station: The patient has a normal gait. Tandem gait is slightly unsteady. Romberg is negative. No drift is seen.  Reflexes: Deep tendon reflexes are symmetric.   Assessment/Plan:  1. Chronic low back pain  The patient has chronic back pain without radiation down the legs. He is quite stable with the back pain, he is doing well on Ultram. Another prescription was written. I have indicated that if his primary care physician is willing to write the prescription for Ultram, he can consolidate doctors, and follow-up through this office if needed. Otherwise, he will be seen in one year.  Jill Alexanders MD 12/01/2014 8:31 AM  [facility] [address] [place], [place] [address]  Phone [phone] Fax [phone]

## 2014-12-10 ENCOUNTER — Encounter (HOSPITAL_COMMUNITY): Payer: Self-pay | Admitting: *Deleted

## 2014-12-10 ENCOUNTER — Emergency Department (INDEPENDENT_AMBULATORY_CARE_PROVIDER_SITE_OTHER): Payer: Medicare PPO

## 2014-12-10 ENCOUNTER — Emergency Department (INDEPENDENT_AMBULATORY_CARE_PROVIDER_SITE_OTHER)
Admission: EM | Admit: 2014-12-10 | Discharge: 2014-12-10 | Disposition: A | Discharge status: Home / Self Care | Payer: Medicare PPO | Source: Home / Self Care | Attending: Emergency Medicine | Admitting: Emergency Medicine

## 2014-12-10 DIAGNOSIS — M25511 Pain in right shoulder: Secondary | ICD-10-CM

## 2014-12-10 NOTE — Discharge Instructions (Signed)
Acromioclavicular Injuries °The AC (acromioclavicular) joint is the joint in the shoulder where the collarbone (clavicle) meets the shoulder blade (scapula). The part of the shoulder blade connected to the collarbone is called the acromion. Common problems with and treatments for the AC joint are detailed below. °ARTHRITIS °Arthritis occurs when the joint has been injured and the smooth padding between the joints (cartilage) is lost. This is the wear and tear seen in most joints of the body if they have been overused. This causes the joint to produce pain and swelling which is worse with activity.  °AC JOINT SEPARATION °AC joint separation means that the ligaments connecting the acromion of the shoulder blade and collarbone have been damaged, and the two bones no longer line up. AC separations can be anywhere from mild to severe, and are "graded" depending upon which ligaments are torn and how badly they are torn. °· Grade I Injury: the least damage is done, and the AC joint still lines up. °· Grade II Injury: damage to the ligaments which reinforce the AC joint. In a Grade II injury, these ligaments are stretched but not entirely torn. When stressed, the AC joint becomes painful and unstable. °· Grade III Injury: AC and secondary ligaments are completely torn, and the collarbone is no longer attached to the shoulder blade. This results in deformity; a prominence of the end of the clavicle. °AC JOINT FRACTURE °AC joint fracture means that there has been a break in the bones of the AC joint, usually the end of the clavicle. °TREATMENT °TREATMENT OF AC ARTHRITIS °· There is currently no way to replace the cartilage damaged by arthritis. The best way to improve the condition is to decrease the activities which aggravate the problem. Application of ice to the joint helps decrease pain and soreness (inflammation). The use of non-steroidal anti-inflammatory medication is helpful. °· If less conservative measures do not  work, then cortisone shots (injections) may be used. These are anti-inflammatories; they decrease the soreness in the joint and swelling. °· If non-surgical measures fail, surgery may be recommended. The procedure is generally removal of a portion of the end of the clavicle. This is the part of the collarbone closest to your acromion which is stabilized with ligaments to the acromion of the shoulder blade. This surgery may be performed using a tube-like instrument with a light (arthroscope) for looking into a joint. It may also be performed as an open surgery through a small incision by the surgeon. Most patients will have good range of motion within 6 weeks and may return to all activity including sports by 8-12 weeks, barring complications. °TREATMENT OF AN AC SEPARATION °· The initial treatment is to decrease pain. This is best accomplished by immobilizing the arm in a sling and placing an ice pack to the shoulder for 20 to 30 minutes every 2 hours as needed. As the pain starts to subside, it is important to begin moving the fingers, wrist, elbow and eventually the shoulder in order to prevent a stiff or "frozen" shoulder. Instruction on when and how much to move the shoulder will be provided by your caregiver. The length of time needed to regain full motion and function depends on the amount or grade of the injury. Recovery from a Grade I AC separation usually takes 10 to 14 days, whereas a Grade III may take 6 to 8 weeks. °· Grade I and II separations usually do not require surgery. Even Grade III injuries usually allow return to full   activity with few restrictions. Treatment is also based on the activity demands of the injured shoulder. For example, a high level quarterback with an injured throwing arm will receive more aggressive treatment than someone with a desk job who rarely uses his/her arm for strenuous activities. In some cases, a painful lump may persist which could require a later surgery. Surgery  can be very successful, but the benefits must be weighed against the potential risks. °TREATMENT OF AN AC JOINT FRACTURE °Fracture treatment depends on the type of fracture. Sometimes a splint or sling may be all that is required. Other times surgery may be required for repair. This is more frequently the case when the ligaments supporting the clavicle are completely torn. Your caregiver will help you with these decisions and together you can decide what will be the best treatment. °HOME CARE INSTRUCTIONS  °· Apply ice to the injury for 15-20 minutes each hour while awake for 2 days. Put the ice in a plastic bag and place a towel between the bag of ice and skin. °· If a sling has been applied, wear it constantly for as long as directed by your caregiver, even at night. The sling or splint can be removed for bathing or showering or as directed. Be sure to keep the shoulder in the same place as when the sling is on. Do not lift the arm. °· If a figure-of-eight splint has been applied it should be tightened gently by another person every day. Tighten it enough to keep the shoulders held back. Allow enough room to place the index finger between the body and strap. Loosen the splint immediately if there is numbness or tingling in the hands. °· Take over-the-counter or prescription medicines for pain, discomfort or fever as directed by your caregiver. °· If you or your child has received a follow up appointment, it is very important to keep that appointment in order to avoid long term complications, chronic pain or disability. °SEEK MEDICAL CARE IF:  °· The pain is not relieved with medications. °· There is increased swelling or discoloration that continues to get worse rather than better. °· You or your child has been unable to follow up as instructed. °· There is progressive numbness and tingling in the arm, forearm or hand. °SEEK IMMEDIATE MEDICAL CARE IF:  °· The arm is numb, cold or pale. °· There is increasing pain  in the hand, forearm or fingers. °MAKE SURE YOU:  °· Understand these instructions. °· Will watch your condition. °· Will get help right away if you are not doing well or get worse. °Document Released: 07/30/2005 Document Revised: 01/12/2012 Document Reviewed: 01/22/2009 °ExitCare® Patient Information ©2015 [employer]. This information is not intended to replace advice given to you by your health care provider. Make sure you discuss any questions you have with your health care provider. ° °

## 2014-12-10 NOTE — ED Provider Notes (Signed)
CSN: [id number]     Arrival date & time 12/10/14  0945 History   First MD Initiated Contact with Patient 12/10/14 1038     Chief Complaint  Patient presents with  . Shoulder Injury   (Consider location/radiation/quality/duration/timing/severity/associated sxs/prior Treatment)  HPI   Patient is a 74 year old male presenting today with complaints of right shoulder pain following a fall again last Wednesday afternoon. States he was "welding a trailer and accidentally tripped when he was climbing across the hitch." Patient states he landed on his right shoulder. Has a history of cervical spondylosis for which he was advised to have surgical repair. Has a persistent numbness and tingling of bilateral upper extremities. Denies any increase in numbness or tingling since fall.  In addition the patient has a history of low back pain for which he takes half a tramadol tablet daily.  Past Medical History  Diagnosis Date  . Hypertension   . Hyperlipemia   . Kidney stones   . Enlarged prostate   . Cervical spondylosis without myelopathy 12/01/2013  . Lumbago 12/01/2013  . Hearing deficit   . Diabetes mellitus     borderline   Past Surgical History  Procedure Laterality Date  . Cataract extraction Bilateral 2014   Family History  Problem Relation Age of Onset  . Cancer Mother     Stomach  . Cancer - Lung Father   . Heart attack Brother    History  Substance Use Topics  . Smoking status: Former Smoker    Quit date: 12/01/1998  . Smokeless tobacco: Never Used  . Alcohol Use: No    Review of Systems  Constitutional: Negative.  Negative for fever, chills and fatigue.  HENT: Negative.   Eyes: Negative.   Respiratory: Negative.  Negative for cough, chest tightness, shortness of breath and wheezing.   Cardiovascular: Negative.  Negative for chest pain.  Gastrointestinal: Negative.   Endocrine: Negative.   Genitourinary: Negative.   Musculoskeletal: Positive for neck pain. Negative for  neck stiffness.       Patient has persistent neck pain related to cervical spondylosis. Eyes any new onset of neck pain secondary to fall.  Denies swelling or bruising of shoulder joint.  Denies discomfort at rest. However reports pain at 4/10 on scale with movement.  Skin: Negative.  Negative for wound.  Allergic/Immunologic: Negative.   Neurological: Positive for numbness. Negative for dizziness, facial asymmetry and headaches.  Hematological: Negative.   Psychiatric/Behavioral: Negative.     Allergies  Penicillins  Home Medications   Prior to Admission medications   Medication Sig Start Date End Date Taking? Authorizing Provider  alfuzosin (UROXATRAL) 10 MG 24 hr tablet Take 10 mg by mouth daily.   Yes Historical Provider, MD  amLODipine (NORVASC) 10 MG tablet Take 10 mg by mouth daily.   Yes Historical Provider, MD  aspirin 81 MG tablet Take 81 mg by mouth daily.   Yes Historical Provider, MD  simvastatin (ZOCOR) 20 MG tablet Take 20 mg by mouth every evening.   Yes Historical Provider, MD  traMADol (ULTRAM) 50 MG tablet TAKE 1/2 TABLET BY MOUTH TWICE DAILY 12/01/14  Yes Kathrynn Ducking, MD  lisinopril-hydrochlorothiazide (PRINZIDE,ZESTORETIC) 10-12.5 MG per tablet Take 1 tablet by mouth daily.    Historical Provider, MD   BP 128/62 mmHg  Pulse 60  Temp(Src) 97.8 F (36.6 C) (Oral)  Resp 16  SpO2 98%   Physical Exam  Constitutional: He is oriented to person, place, and time. He appears well-developed and  well-nourished. No distress.  HENT:  Head: Normocephalic and atraumatic.  Eyes: Pupils are equal, round, and reactive to light.  Neck: Normal range of motion.  Cardiovascular: Normal rate, regular rhythm, normal heart sounds and intact distal pulses.  Exam reveals no gallop and no friction rub.   No murmur heard. Pulmonary/Chest: Effort normal and breath sounds normal. No respiratory distress. He has no wheezes. He has no rales. He exhibits no tenderness.  Musculoskeletal:        Right shoulder: He exhibits decreased range of motion, tenderness, bony tenderness and pain. He exhibits no swelling, no effusion, no deformity, no spasm, normal pulse and normal strength.  Patient presents in no apparent distress.  There is no obvious asymmetry or deformity of the right shoulder as compared to the left shoulder. No surface trauma or ecchymosis. Crepitus is present bilaterally.  There is no bony deformity or prominence of the humeral head. No erythema, warmth, or swelling appreciated.  The patient has no tenderness to palpation of the clavicle and acromion process. Moderate tenderness with palpation over the right AC joint. No tenderness over the scapula or humeral head.  No tenderness to palpation of the musculature. There is pain and limitation noted with active and passive abduction and adduction; and internal and external rotation with flexion and extension. Negative drop arm test. Normal sensation over the deltoid and ability to flex elbow intact.  Distal motor and neurovascular status is intact. Bilateral upper extremity strength 5/5. Color, movement, and distal sensation intact. Cap refill less than 3 seconds to distal phalanges bilaterally.       Lymphadenopathy:    He has no cervical adenopathy.  Neurological: He is alert and oriented to person, place, and time. Coordination normal.  Skin: Skin is warm and dry. No rash noted. He is not diaphoretic. No erythema. No pallor.  Nursing note and vitals reviewed.     ED Course  Procedures (including critical care time) Labs Review Labs Reviewed - No data to display  Imaging Review Dg Shoulder Right  12/10/2014   CLINICAL DATA:  Fall 12/06/2014 with right shoulder pain.  EXAM: RIGHT SHOULDER - 2+ VIEW  COMPARISON:  05/19/2014  FINDINGS: There are minimal degenerate changes of the [facility] joint and glenohumeral joints. There is no acute fracture or dislocation. There are degenerative changes of the spine.  IMPRESSION: No acute  findings.   Electronically Signed   By: Marin Olp M.D.   On: 12/10/2014 11:46     MDM   1. Shoulder pain, acute, right    Patient refused any additional pain medication for discomfort. He agrees to follow up with primary care provider or orthopedist if symptoms fail to improve or worsen. In addition, patient agrees to continue movement of right shoulder and joint as tolerated to avoid potential frozen shoulder. The patient verbalizes understanding and agrees to plan of care.       Nehemiah Settle, NP 12/10/14 1202

## 2014-12-10 NOTE — ED Notes (Signed)
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  Pain when arm gets tho shoulder level.

## 2014-12-26 ENCOUNTER — Other Ambulatory Visit: Payer: Self-pay | Admitting: Neurology

## 2014-12-26 NOTE — Telephone Encounter (Signed)
Rx was written on 01/29.  I spoke with pharmacist who verified they do have this Rx on file.

## 2015-04-16 DIAGNOSIS — N400 Enlarged prostate without lower urinary tract symptoms: Secondary | ICD-10-CM | POA: Diagnosis not present

## 2015-04-16 DIAGNOSIS — N281 Cyst of kidney, acquired: Secondary | ICD-10-CM | POA: Diagnosis not present

## 2015-04-16 DIAGNOSIS — C439 Malignant melanoma of skin, unspecified: Secondary | ICD-10-CM | POA: Diagnosis not present

## 2015-04-16 DIAGNOSIS — E278 Other specified disorders of adrenal gland: Secondary | ICD-10-CM | POA: Diagnosis not present

## 2015-04-16 DIAGNOSIS — R310 Gross hematuria: Secondary | ICD-10-CM | POA: Diagnosis not present

## 2015-04-16 DIAGNOSIS — R3912 Poor urinary stream: Secondary | ICD-10-CM | POA: Diagnosis not present

## 2015-05-16 DIAGNOSIS — R310 Gross hematuria: Secondary | ICD-10-CM | POA: Diagnosis not present

## 2015-05-17 ENCOUNTER — Emergency Department (HOSPITAL_COMMUNITY): Payer: Medicare PPO

## 2015-05-17 ENCOUNTER — Encounter (HOSPITAL_COMMUNITY): Payer: Self-pay

## 2015-05-17 ENCOUNTER — Emergency Department (HOSPITAL_COMMUNITY)
Admission: EM | Admit: 2015-05-17 | Discharge: 2015-05-17 | Disposition: A | Discharge status: Home / Self Care | Payer: Medicare PPO | Attending: Emergency Medicine | Admitting: Emergency Medicine

## 2015-05-17 DIAGNOSIS — E119 Type 2 diabetes mellitus without complications: Secondary | ICD-10-CM | POA: Diagnosis not present

## 2015-05-17 DIAGNOSIS — R509 Fever, unspecified: Secondary | ICD-10-CM | POA: Diagnosis not present

## 2015-05-17 DIAGNOSIS — Z79899 Other long term (current) drug therapy: Secondary | ICD-10-CM | POA: Insufficient documentation

## 2015-05-17 DIAGNOSIS — R112 Nausea with vomiting, unspecified: Secondary | ICD-10-CM | POA: Insufficient documentation

## 2015-05-17 DIAGNOSIS — Z8739 Personal history of other diseases of the musculoskeletal system and connective tissue: Secondary | ICD-10-CM | POA: Insufficient documentation

## 2015-05-17 DIAGNOSIS — Z87438 Personal history of other diseases of male genital organs: Secondary | ICD-10-CM | POA: Diagnosis not present

## 2015-05-17 DIAGNOSIS — I10 Essential (primary) hypertension: Secondary | ICD-10-CM | POA: Diagnosis not present

## 2015-05-17 DIAGNOSIS — Z880 Allergy status to penicillin: Secondary | ICD-10-CM | POA: Diagnosis not present

## 2015-05-17 DIAGNOSIS — H9190 Unspecified hearing loss, unspecified ear: Secondary | ICD-10-CM | POA: Insufficient documentation

## 2015-05-17 DIAGNOSIS — Z7982 Long term (current) use of aspirin: Secondary | ICD-10-CM | POA: Insufficient documentation

## 2015-05-17 DIAGNOSIS — R51 Headache: Secondary | ICD-10-CM | POA: Diagnosis not present

## 2015-05-17 DIAGNOSIS — J189 Pneumonia, unspecified organism: Secondary | ICD-10-CM | POA: Diagnosis not present

## 2015-05-17 DIAGNOSIS — J159 Unspecified bacterial pneumonia: Secondary | ICD-10-CM | POA: Insufficient documentation

## 2015-05-17 DIAGNOSIS — Z87442 Personal history of urinary calculi: Secondary | ICD-10-CM | POA: Insufficient documentation

## 2015-05-17 DIAGNOSIS — Z87891 Personal history of nicotine dependence: Secondary | ICD-10-CM | POA: Insufficient documentation

## 2015-05-17 DIAGNOSIS — R6889 Other general symptoms and signs: Secondary | ICD-10-CM | POA: Diagnosis not present

## 2015-05-17 DIAGNOSIS — E785 Hyperlipidemia, unspecified: Secondary | ICD-10-CM | POA: Insufficient documentation

## 2015-05-17 LAB — CBC WITH DIFFERENTIAL/PLATELET
Basophils Absolute: 0 10*3/uL (ref 0.0–0.1)
Basophils Relative: 0 % (ref 0–1)
Eosinophils Absolute: 0 10*3/uL (ref 0.0–0.7)
Eosinophils Relative: 0 % (ref 0–5)
HCT: 35.2 % — ABNORMAL LOW (ref 39.0–52.0)
Hemoglobin: 12.3 g/dL — ABNORMAL LOW (ref 13.0–17.0)
Lymphocytes Relative: 3 % — ABNORMAL LOW (ref 12–46)
Lymphs Abs: 0.3 10*3/uL — ABNORMAL LOW (ref 0.7–4.0)
MCH: 35 pg — ABNORMAL HIGH (ref 26.0–34.0)
MCHC: 34.9 g/dL (ref 30.0–36.0)
MCV: 100.3 fL — ABNORMAL HIGH (ref 78.0–100.0)
Monocytes Absolute: 0.7 10*3/uL (ref 0.1–1.0)
Monocytes Relative: 6 % (ref 3–12)
Neutro Abs: 10.2 10*3/uL — ABNORMAL HIGH (ref 1.7–7.7)
Neutrophils Relative %: 91 % — ABNORMAL HIGH (ref 43–77)
Platelets: 111 10*3/uL — ABNORMAL LOW (ref 150–400)
RBC: 3.51 MIL/uL — ABNORMAL LOW (ref 4.22–5.81)
RDW: 12.5 % (ref 11.5–15.5)
WBC: 11.2 10*3/uL — ABNORMAL HIGH (ref 4.0–10.5)

## 2015-05-17 LAB — BASIC METABOLIC PANEL
Anion gap: 4 — ABNORMAL LOW (ref 5–15)
BUN: 25 mg/dL — ABNORMAL HIGH (ref 6–20)
CO2: 28 mmol/L (ref 22–32)
Calcium: 8.2 mg/dL — ABNORMAL LOW (ref 8.9–10.3)
Chloride: 101 mmol/L (ref 101–111)
Creatinine, Ser: 1.42 mg/dL — ABNORMAL HIGH (ref 0.61–1.24)
GFR calc Af Amer: 55 mL/min — ABNORMAL LOW (ref >60)
GFR calc non Af Amer: 47 mL/min — ABNORMAL LOW (ref >60)
Glucose, Bld: 130 mg/dL — ABNORMAL HIGH (ref 65–99)
Potassium: 3.5 mmol/L (ref 3.5–5.1)
Sodium: 133 mmol/L — ABNORMAL LOW (ref 135–145)

## 2015-05-17 MED ORDER — LEVOFLOXACIN 500 MG PO TABS: 500.0000 mg | ORAL_TABLET | Freq: Every day | ORAL | Status: DC

## 2015-05-17 MED ORDER — ACETAMINOPHEN 325 MG PO TABS
650.0000 mg | ORAL_TABLET | Freq: Once | ORAL | Status: AC
  Administered 2015-05-17: 650 mg via ORAL
  Filled 2015-05-17: qty 2

## 2015-05-17 MED ORDER — LEVOFLOXACIN 500 MG PO TABS
500.0000 mg | ORAL_TABLET | Freq: Once | ORAL | Status: AC
  Administered 2015-05-17: 500 mg via ORAL
  Filled 2015-05-17: qty 1

## 2015-05-17 MED ORDER — SODIUM CHLORIDE 0.9 % IV SOLN: 1000.0000 mL | INTRAVENOUS | Status: DC

## 2015-05-17 MED ORDER — SODIUM CHLORIDE 0.9 % IV SOLN
1000.0000 mL | Freq: Once | INTRAVENOUS | Status: AC
  Administered 2015-05-17: 1000 mL via INTRAVENOUS

## 2015-05-17 NOTE — ED Notes (Addendum)
Per EMS- Today the patient developed a fever of 102.0 orally with N/V, chills, weakness, and dizziness.. Patient was seen yesterday at PCP for c/o lower abdominal pain. Patiaent was ggiven zofran 4 mg IV and 100 ml NS prior to arrival to the Ed. Nausea improved.

## 2015-05-17 NOTE — ED Provider Notes (Signed)
CSN: [id number]     Arrival date & time 05/17/15  1454 History   First MD Initiated Contact with Patient 05/17/15 1550     Chief Complaint  Patient presents with  . Fever  . Nausea      HPI Patient presents emergency department complaining of fever at home today.  He was riding on his lawnmower began to feel weak and nauseated.  He went to the house.  He began having chills.  Denies cough or congestion.  No shortness of breath.  Denies abdominal pain.  He states he had nausea and vomiting at home but is no longer nauseated.  He reports he was feeling dizzy but is no longer feeling dizzy either.  He denies dysuria or urinary frequency.  No new rash.  Denies neck pain or neck stiffness.  Fellow reports no confusion or altered mental status.  No rash.  Patient is without other complaints.  His symptoms are mild in severity.   Past Medical History  Diagnosis Date  . Hypertension   . Hyperlipemia   . Kidney stones   . Enlarged prostate   . Cervical spondylosis without myelopathy 12/01/2013  . Lumbago 12/01/2013  . Hearing deficit   . Diabetes mellitus     borderline   Past Surgical History  Procedure Laterality Date  . Cataract extraction Bilateral 2014   Family History  Problem Relation Age of Onset  . Cancer Mother     Stomach  . Cancer - Lung Father   . Heart attack Brother    History  Substance Use Topics  . Smoking status: Former Smoker    Quit date: 12/01/1998  . Smokeless tobacco: Never Used  . Alcohol Use: No    Review of Systems  All other systems reviewed and are negative.     Allergies  Penicillins  Home Medications   Prior to Admission medications   Medication Sig Start Date End Date Taking? Authorizing Provider  alfuzosin (UROXATRAL) 10 MG 24 hr tablet Take 10 mg by mouth daily.   Yes Historical Provider, MD  amLODipine (NORVASC) 10 MG tablet Take 10 mg by mouth daily.   Yes Historical Provider, MD  aspirin 81 MG tablet Take 81 mg by mouth daily.    Yes Historical Provider, MD  Cyanocobalamin (VITAMIN B-12) 5000 MCG SUBL Place 1 tablet under the tongue daily.   Yes Historical Provider, MD  finasteride (PROSCAR) 5 MG tablet Take 5 mg by mouth daily. 05/16/15  Yes Historical Provider, MD  lisinopril-hydrochlorothiazide (PRINZIDE,ZESTORETIC) 20-25 MG per tablet Take 1 tablet by mouth daily. 03/12/15  Yes Historical Provider, MD  Omega-3 Fatty Acids (FISH OIL) 1000 MG CAPS Take 1,000 mg by mouth daily.   Yes Historical Provider, MD  simvastatin (ZOCOR) 20 MG tablet Take 20 mg by mouth every evening.   Yes Historical Provider, MD  traMADol (ULTRAM) 50 MG tablet TAKE 1/2 TABLET BY MOUTH TWICE DAILY Patient taking differently: Take 25 mg by mouth 2 (two) times daily as needed for moderate pain or severe pain. TAKE 1/2 TABLET BY MOUTH TWICE DAILY 12/01/14  Yes Kathrynn Ducking, MD  vitamin E 400 UNIT capsule Take 400 Units by mouth daily.   Yes Historical Provider, MD  levofloxacin (LEVAQUIN) 500 MG tablet Take 1 tablet (500 mg total) by mouth daily. 05/17/15   Jola Schmidt, MD   BP 107/40 mmHg  Pulse 83  Temp(Src) 98.7 F (37.1 C) (Oral)  Resp 19  SpO2 93% Physical Exam  Constitutional: He  is oriented to person, place, and time. He appears well-developed and well-nourished.  HENT:  Head: Normocephalic and atraumatic.  Eyes: EOM are normal.  Neck: Normal range of motion.  Cardiovascular: Normal rate, regular rhythm, normal heart sounds and intact distal pulses.   Pulmonary/Chest: Effort normal and breath sounds normal. No respiratory distress.  Rhonchi right lower base  Abdominal: Soft. He exhibits no distension. There is no tenderness.  Musculoskeletal: Normal range of motion.  Neurological: He is alert and oriented to person, place, and time.  Skin: Skin is warm and dry.  Psychiatric: He has a normal mood and affect. Judgment normal.  Nursing note and vitals reviewed.   ED Course  Procedures (including critical care time) Labs  Review Labs Reviewed  CBC WITH DIFFERENTIAL/PLATELET - Abnormal; Notable for the following:    WBC 11.2 (*)    RBC 3.51 (*)    Hemoglobin 12.3 (*)    HCT 35.2 (*)    MCV 100.3 (*)    MCH 35.0 (*)    Platelets 111 (*)    Neutrophils Relative % 91 (*)    Neutro Abs 10.2 (*)    Lymphocytes Relative 3 (*)    Lymphs Abs 0.3 (*)    All other components within normal limits  BASIC METABOLIC PANEL - Abnormal; Notable for the following:    Sodium 133 (*)    Glucose, Bld 130 (*)    BUN 25 (*)    Creatinine, Ser 1.42 (*)    Calcium 8.2 (*)    GFR calc non Af Amer 47 (*)    GFR calc Af Amer 55 (*)    Anion gap 4 (*)    All other components within normal limits  URINE CULTURE  URINALYSIS, ROUTINE W REFLEX MICROSCOPIC (NOT AT [facility])  I personally reviewed the imaging tests through PACS system I reviewed available ER/hospitalization records through the EMR   Imaging Review Dg Chest 2 View  05/17/2015   CLINICAL DATA:  Fever with dizziness and chills  EXAM: CHEST  2 VIEW  COMPARISON:  July 06, 2012  FINDINGS: There is focal infiltrate in the right base. Lungs elsewhere clear. Heart size and pulmonary vascularity are normal. No adenopathy. There is atherosclerotic change in aorta. No bone lesions.  IMPRESSION: Patchy infiltrate right base.  Lungs elsewhere clear.   Electronically Signed   By: Lowella Grip III M.D.   On: 05/17/2015 16:40  I personally reviewed the imaging tests through PACS system I reviewed available ER/hospitalization records through the EMR    EKG Interpretation None      MDM   Final diagnoses:  CAP (community acquired pneumonia)    Patient is overall well-appearing.  He has low-grade fever.  His abdominal exam is benign.  Questionable right lower lobe pneumonia.  Patient be covered for [facility] acquired pneumonia with Levaquin.  Patient understands return to the ER for new or worsening symptoms    Jola Schmidt, MD 05/17/15 4765

## 2015-05-17 NOTE — ED Notes (Signed)
Bed: YJ49 Expected date:  Expected time:  Means of arrival:  Comments: EMS- 74yo M, fever

## 2015-05-21 DIAGNOSIS — J189 Pneumonia, unspecified organism: Secondary | ICD-10-CM | POA: Diagnosis not present

## 2015-06-01 DIAGNOSIS — R918 Other nonspecific abnormal finding of lung field: Secondary | ICD-10-CM | POA: Diagnosis not present

## 2015-06-01 DIAGNOSIS — E782 Mixed hyperlipidemia: Secondary | ICD-10-CM | POA: Diagnosis not present

## 2015-06-01 DIAGNOSIS — I10 Essential (primary) hypertension: Secondary | ICD-10-CM | POA: Diagnosis not present

## 2015-06-01 DIAGNOSIS — M5412 Radiculopathy, cervical region: Secondary | ICD-10-CM | POA: Diagnosis not present

## 2015-06-01 DIAGNOSIS — M545 Low back pain: Secondary | ICD-10-CM | POA: Diagnosis not present

## 2015-06-01 DIAGNOSIS — J309 Allergic rhinitis, unspecified: Secondary | ICD-10-CM | POA: Diagnosis not present

## 2015-06-01 DIAGNOSIS — R7309 Other abnormal glucose: Secondary | ICD-10-CM | POA: Diagnosis not present

## 2015-06-01 DIAGNOSIS — N400 Enlarged prostate without lower urinary tract symptoms: Secondary | ICD-10-CM | POA: Diagnosis not present

## 2015-06-01 DIAGNOSIS — D696 Thrombocytopenia, unspecified: Secondary | ICD-10-CM | POA: Diagnosis not present

## 2015-08-23 ENCOUNTER — Other Ambulatory Visit: Payer: Self-pay | Admitting: Neurology

## 2015-10-19 DIAGNOSIS — J0100 Acute maxillary sinusitis, unspecified: Secondary | ICD-10-CM | POA: Diagnosis not present

## 2015-11-06 ENCOUNTER — Ambulatory Visit: Payer: Medicare HMO | Admitting: Neurology

## 2015-12-06 ENCOUNTER — Other Ambulatory Visit: Payer: Self-pay | Admitting: Family Medicine

## 2015-12-06 DIAGNOSIS — R911 Solitary pulmonary nodule: Secondary | ICD-10-CM

## 2015-12-12 ENCOUNTER — Ambulatory Visit
Admission: RE | Admit: 2015-12-12 | Discharge: 2015-12-12 | Disposition: A | Discharge status: Home / Self Care | Payer: Medicare Other | Source: Ambulatory Visit | Attending: Family Medicine | Admitting: Family Medicine

## 2015-12-12 ENCOUNTER — Other Ambulatory Visit (HOSPITAL_COMMUNITY): Payer: Self-pay | Admitting: Family Medicine

## 2015-12-12 DIAGNOSIS — R911 Solitary pulmonary nodule: Secondary | ICD-10-CM

## 2015-12-12 MED ORDER — IOPAMIDOL (ISOVUE-300) INJECTION 61%
75.0000 mL | Freq: Once | INTRAVENOUS | Status: AC | PRN
  Administered 2015-12-12: 75 mL via INTRAVENOUS

## 2015-12-21 ENCOUNTER — Ambulatory Visit (HOSPITAL_COMMUNITY): Payer: Medicare Other

## 2016-01-01 ENCOUNTER — Encounter (HOSPITAL_COMMUNITY)
Admission: RE | Admit: 2016-01-01 | Discharge: 2016-01-01 | Disposition: A | Discharge status: Home / Self Care | Payer: Medicare Other | Source: Ambulatory Visit | Attending: Family Medicine | Admitting: Family Medicine

## 2016-01-01 DIAGNOSIS — R590 Localized enlarged lymph nodes: Secondary | ICD-10-CM | POA: Insufficient documentation

## 2016-01-01 DIAGNOSIS — R911 Solitary pulmonary nodule: Secondary | ICD-10-CM

## 2016-01-01 DIAGNOSIS — E278 Other specified disorders of adrenal gland: Secondary | ICD-10-CM | POA: Diagnosis not present

## 2016-01-01 LAB — GLUCOSE, CAPILLARY: Glucose-Capillary: 118 mg/dL — ABNORMAL HIGH (ref 65–99)

## 2016-01-01 MED ORDER — FLUDEOXYGLUCOSE F - 18 (FDG) INJECTION
9.8300 | Freq: Once | INTRAVENOUS | Status: AC | PRN
  Administered 2016-01-01: 9.83 via INTRAVENOUS

## 2016-01-07 ENCOUNTER — Telehealth: Payer: Self-pay | Admitting: *Deleted

## 2016-01-07 NOTE — Telephone Encounter (Signed)
Oncology Nurse Navigator Documentation  Oncology Nurse Navigator Flowsheets 01/07/2016  Navigator Encounter Type Introductory phone call/I received referral today on [name].  I called wife to schedule for [place] arrive at 2:45.  She verbalized understanding of appt time and place.   Barriers/Navigation Needs Coordination of Care  Interventions Coordination of Care  Coordination of Care Appts  Acuity Level 1  Time Spent with Patient 15

## 2016-01-09 ENCOUNTER — Telehealth: Payer: Self-pay | Admitting: *Deleted

## 2016-01-09 ENCOUNTER — Encounter: Payer: Self-pay | Admitting: *Deleted

## 2016-01-09 DIAGNOSIS — I10 Essential (primary) hypertension: Secondary | ICD-10-CM | POA: Insufficient documentation

## 2016-01-09 DIAGNOSIS — D696 Thrombocytopenia, unspecified: Secondary | ICD-10-CM

## 2016-01-09 DIAGNOSIS — N400 Enlarged prostate without lower urinary tract symptoms: Secondary | ICD-10-CM

## 2016-01-09 DIAGNOSIS — R911 Solitary pulmonary nodule: Secondary | ICD-10-CM

## 2016-01-09 NOTE — Telephone Encounter (Signed)
Called and confirmed 01/10/16 clinic appt w/ pt.  

## 2016-01-10 ENCOUNTER — Telehealth: Payer: Self-pay | Admitting: *Deleted

## 2016-01-10 ENCOUNTER — Encounter: Payer: Medicare Other | Admitting: Cardiothoracic Surgery

## 2016-01-10 ENCOUNTER — Ambulatory Visit
Admission: RE | Admit: 2016-01-10 | Discharge: 2016-01-10 | Disposition: A | Discharge status: Home / Self Care | Payer: Medicare Other | Source: Ambulatory Visit | Attending: Radiation Oncology | Admitting: Radiation Oncology

## 2016-01-10 ENCOUNTER — Ambulatory Visit: Payer: Medicare Other | Admitting: Radiation Oncology

## 2016-01-10 ENCOUNTER — Ambulatory Visit: Payer: Medicare Other | Admitting: Physical Therapy

## 2016-01-10 NOTE — Telephone Encounter (Signed)
Oncology Nurse Navigator Documentation  Oncology Nurse Navigator Flowsheets 01/10/2016  Navigator Encounter Type Telephone/I received a call from Dr. Ewell Poe office.  He is still in the OR and will not make it to clinic today.  I called patient to update him and gave him an appt on 01/15/16 to see Dr. Servando Snare at his office.  I gave his wife the address of Dr. Everrett Coombe office.   Telephone Outgoing Call  Barriers/Navigation Needs Coordination of Care  Interventions Coordination of Care  Coordination of Care Appts  Acuity Level 1  Time Spent with Patient 15

## 2016-01-10 NOTE — Telephone Encounter (Signed)
Oncology Nurse Navigator Documentation  Oncology Nurse Navigator Flowsheets 01/10/2016  Navigator Encounter Type Telephone/I called James Sweeney to update him that his appt time has been changed due to cancer conference discussion.  He will arrive today at 3:45.  He verbalized understanding   Barriers/Navigation Needs Coordination of Care  Interventions Coordination of Care  Coordination of Care Appts  Acuity Level 1  Time Spent with Patient 15

## 2016-01-15 ENCOUNTER — Encounter: Payer: Medicare Other | Admitting: Cardiothoracic Surgery

## 2016-01-15 ENCOUNTER — Other Ambulatory Visit: Payer: Self-pay | Admitting: *Deleted

## 2016-01-15 ENCOUNTER — Institutional Professional Consult (permissible substitution) (INDEPENDENT_AMBULATORY_CARE_PROVIDER_SITE_OTHER): Payer: Medicare Other | Admitting: Cardiothoracic Surgery

## 2016-01-15 ENCOUNTER — Encounter: Payer: Self-pay | Admitting: Cardiothoracic Surgery

## 2016-01-15 VITALS — BP 144/66 | HR 71 | Resp 16 | Ht 70.0 in | Wt 197.0 lb

## 2016-01-15 DIAGNOSIS — R918 Other nonspecific abnormal finding of lung field: Secondary | ICD-10-CM

## 2016-01-15 DIAGNOSIS — D381 Neoplasm of uncertain behavior of trachea, bronchus and lung: Secondary | ICD-10-CM

## 2016-01-15 NOTE — Progress Notes (Signed)
[address]       [place] [address]             [phone]                    Kayen E Hendler [place] Medical Record [id number] Date of Birth: Feb 05, 1941  Referring: Antony Contras, MD Primary Care: Gara Kroner, MD  Chief Complaint:    Chief Complaint  Patient presents with  . Lung Lesion    RMLobe...CT CHEST 12/12/15, PET 01/01/16    History of Present Illness:    James Sweeney 75 y.o. male is seen in the office  today for enlarging right lung nodule , present on CT of abdomen in June ,2016. Patient quit smoking 40-45 years ago. Repeat ct of chest showed right lung nodule grew 36mm. PET ordered . 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     Current Activity/ Functional Status:  Patient is independent with mobility/ambulation, transfers, ADL's, IADL's.   Zubrod Score: At the time of surgery this patient's most appropriate activity status/level should be described as: []     0    Normal activity, no symptoms [x]     1    Restricted in physical strenuous activity but ambulatory, able to do out light work []     2    Ambulatory and capable of self care, unable to do work activities, up and about               >50 % of waking hours                              []     3    Only limited self care, in bed greater than 50% of waking hours []     4    Completely disabled, no self care, confined to bed or chair []     5    Moribund   Past Medical History  Diagnosis Date  . Hypertension   . Hyperlipemia   . Kidney stones   . Enlarged prostate   . Cervical spondylosis without myelopathy 12/01/2013  . Lumbago 12/01/2013  . Hearing deficit   . Diabetes mellitus     borderline    Past Surgical History  Procedure Laterality Date  . Cataract extraction Bilateral 2014    Family History  Problem Relation Age of Onset  . Cancer Mother     Stomach  . Cancer - Lung Father   . Heart attack Brother     Social History   Social  History  . Marital Status: Married    Spouse Name: N/A  . Number of Children: 2  . Years of Education: N/A   Occupational History  . Not on file.   Social History Main Topics  . Smoking status: Former Smoker    Quit date: 12/01/1998  . Smokeless tobacco: Never Used  . Alcohol Use: No  . Drug Use: No  . Sexual Activity: No   Other Topics Concern  . Not on file   Social History Narrative   Patient is right handed.   Patient drinks 1 cup caffeine daily.    History  Smoking status  . Former Smoker  . Quit date: 12/01/1998  Smokeless tobacco  . Never Used    History  Alcohol Use No     Allergies  Allergen Reactions  .  Levaquin [Levofloxacin In D5w] Nausea Only  . Penicillins Hives    Current Outpatient Prescriptions  Medication Sig Dispense Refill  . alfuzosin (UROXATRAL) 10 MG 24 hr tablet Take 10 mg by mouth daily.    [name] amLODipine (NORVASC) 10 MG tablet Take 10 mg by mouth daily.    . Ascorbic Acid (VITAMIN C) 100 MG tablet Take 100 mg by mouth daily.    [name] aspirin 81 MG tablet Take 81 mg by mouth daily.    . Cyanocobalamin (VITAMIN B-12) 5000 MCG SUBL Place 1 tablet under the tongue daily.    . finasteride (PROSCAR) 5 MG tablet Take 5 mg by mouth daily.    [name] levofloxacin (LEVAQUIN) 500 MG tablet Take 1 tablet (500 mg total) by mouth daily. 7 tablet 0  . lisinopril-hydrochlorothiazide (PRINZIDE,ZESTORETIC) 20-25 MG per tablet Take 1 tablet by mouth daily.  1  . Omega-3 Fatty Acids (FISH OIL) 1000 MG CAPS Take 1,000 mg by mouth daily.    . simvastatin (ZOCOR) 20 MG tablet Take 20 mg by mouth every evening.    . traMADol (ULTRAM) 50 MG tablet TAKE 1/2 TABLET BY MOUTH TWICE DAILY 90 tablet 0  . vitamin E 400 UNIT capsule Take 400 Units by mouth daily.     No current facility-administered medications for this visit.      Review of Systems:     Cardiac Review of Systems: Y or N  Chest Pain [   n ]  Resting SOB [n   ] Exertional SOB  [ y ]  Orthopnea [ n ]     Pedal Edema [ n  ]    Palpitations [ n ] Syncope  [n]   Presyncope [n   ]  General Review of Systems: [Y] = yes [  ]=no Constitional: recent weight change [  ];  Wt loss over the last 3 months [   ] anorexia [  ]; fatigue [  ]; nausea [  ]; night sweats [  ]; fever [  ]; or chills [  ];          Dental: poor dentition[  ]; Last Dentist visit:   Eye : blurred vision [  ]; diplopia [   ]; vision changes [  ];  Amaurosis fugax[  ]; Resp: cough [  ];  wheezing[ n ];  hemoptysis[n  ]; shortness of breath[  ]; paroxysmal nocturnal dyspnea[  ]; dyspnea on exertion[ y ]; or orthopnea[  ];  GI:  gallstones[  ], vomiting[  ];  dysphagia[  ]; melena[  ];  hematochezia [  ]; heartburn[  ];   Hx of  Colonoscopy[  ]; GU: kidney stones [  ]; hematuria[  ];   dysuria [  ];  nocturia[  ];  history of     obstruction [  ]; urinary frequency [  ]             Skin: rash, swelling[  ];, hair loss[  ];  peripheral edema[  ];  or itching[  ]; Musculosketetal: myalgias[  ];  joint swelling[  ];  joint erythema[  ];  joint pain[  ];  back pain[  ];  Heme/Lymph: bruising[  ];  bleeding[  ];  anemia[  ];  Neuro: TIA[ n ];  headaches[  ];  stroke[  ];  vertigo[  ];  seizures[  ];   paresthesias[  ];  difficulty walking[  ];  Psych:depression[  ]; anxiety[  ];  Endocrine: diabetes[  ];  thyroid dysfunction[  ];  Immunizations: Flu up to date [  ]; Pneumococcal up to date [  ];  Other:  Physical Exam: BP 144/66 mmHg  Pulse 71  Resp 16  Ht 5\' 10"  (1.778 m)  Wt 197 lb (89.359 kg)  BMI 28.27 kg/m2  SpO2 97%  PHYSICAL EXAMINATION: General appearance: alert, cooperative and appears older than stated age Head: Normocephalic, without obvious abnormality, atraumatic Neck: no adenopathy, no carotid bruit, no JVD, supple, symmetrical, trachea midline and thyroid not enlarged, symmetric, no tenderness/mass/nodules Lymph nodes: Cervical, supraclavicular, and axillary nodes normal. Resp: clear to auscultation  bilaterally Back: symmetric, no curvature. ROM normal. No CVA tenderness. Cardio: regular rate and rhythm, S1, S2 normal, no murmur, click, rub or gallop GI: soft, non-tender; bowel sounds normal; no masses,  no organomegaly Extremities: extremities normal, atraumatic, no cyanosis or edema and Homans sign is negative, no sign of DVT Neurologic: Grossly normal  Diagnostic Studies & Laboratory data:     Recent Radiology Findings:   Nm Pet Image Initial (pi) Skull Base To Thigh  01/01/2016  CLINICAL DATA:  Initial treatment strategy for pulmonary nodule. EXAM: NUCLEAR MEDICINE PET SKULL BASE TO THIGH TECHNIQUE: 9.83 mCi F-18 FDG was injected intravenously. Full-ring PET imaging was performed from the skull base to thigh after the radiotracer. CT data was obtained and used for attenuation correction and anatomic localization. FASTING BLOOD GLUCOSE:  Value: 118 mg/dl COMPARISON:  CT chest dated 12/12/2015. CT abdomen pelvis dated 04/16/2015. FINDINGS: NECK No hypermetabolic lymph nodes in the neck. CHEST 12 x 10 mm right middle lobe nodule (series 6/ image 49), max SUV 2.7, suspicious for primary bronchogenic neoplasm. Mild subpleural nodularity in the posterior right upper lobe (series 6/image 19). No focal consolidation. No pleural effusion or pneumothorax. The heart is normal in size. Coronary atherosclerosis in the LAD and right coronary artery. Atherosclerotic calcifications aortic arch. Thoracic lymphadenopathy, including: --10 mm short axis AP window node (series 4/image 66), max SUV 5.7 --11 mm short axis partially calcified prevascular node (series 4/image 68) --12 mm short axis right hilar node (series 4/image 70), max SUV 6.5 --13 mm short axis partially calcified subcarinal node (series 4/image 32), max SUV 6.9 --11 mm short axis left hilar node (series 4/image 32), max SUV 6.6 Visualized right thyroid is notable for an 18 x 10 mm right thyroid nodule (series 4/image 45). ABDOMEN/PELVIS No abnormal  hypermetabolic activity within the liver, pancreas, or spleen. 2.1 x 2.4 cm right adrenal nodule, unchanged from prior 2016 CT abdomen pelvis, max SUV 5.9. Left renal sinus cysts. Atherosclerotic calcification of the abdominal aorta and branch vessels. Prostatomegaly, with enlargement of the central gland which indents the base the bladder. Bladder is mildly thick-walled although underdistended. Tiny fat containing bilateral inguinal hernias. No hypermetabolic lymph nodes in the abdomen or pelvis. SKELETON No focal hypermetabolic activity to suggest skeletal metastasis. IMPRESSION: 12 x 10 mm hypermetabolic right middle lobe nodule, suspicious for primary bronchogenic neoplasm. Hypermetabolic thoracic lymphadenopathy, most of which is partially calcified, favored to reflect granulomatous disease such as sarcoidosis. Nodal metastases is considered less likely. 2.1 x 2.4 cm hypermetabolic right adrenal nodule, unchanged from June 2016. While technically indeterminate, metastasis not excluded, stability favors a benign etiology such as an adrenal adenoma. Electronically Signed   By: Julian Hy M.D.   On: 01/01/2016 10:47  I have independently reviewed the above radiology studies  and reviewed the findings with the patient.  Recent Lab Findings: Lab  Results  Component Value Date   WBC 11.2* 05/17/2015   HGB 12.3* 05/17/2015   HCT 35.2* 05/17/2015   PLT 111* 05/17/2015   GLUCOSE 130* 05/17/2015   NA 133* 05/17/2015   K 3.5 05/17/2015   CL 101 05/17/2015   CREATININE 1.42* 05/17/2015   BUN 25* 05/17/2015   CO2 28 05/17/2015   Chronic Kidney Disease   Stage I     GFR >90  Stage II    GFR 60-89  Stage IIIA GFR 45-59  Stage IIIB GFR 30-44  Stage IV   GFR 15-29  Stage V    GFR  <15  Lab Results  Component Value Date   CREATININE 1.42* 05/17/2015   CrCl cannot be calculated (Patient has no serum creatinine result on file.).    Assessment / Plan:   Right middle lobe 1 cm lung nodule  enlarging with hypermetabolic right adrenal lesion and hypermetabolic bilateral hilar nodes Get PFT's  With Coronary atherosclerosis in the LAD and right coronary artery and family history of MI in brother will get cardiology evaluation  Will see back after PFT and discuss with patient proceeding with bronchoscopy , ENB to right lung mass and EBUS of mediastinal nodes. Then consider resection of right lung mass  I  spent 40 minutes counseling the patient face to face and 50% or more the  time was spent in counseling and coordination of care. The total time spent in the appointment was 60 minutes.  [name] MD      [address] [place] [address] Office [phone]   Beeper [phone]  01/15/2016 4:42 PM

## 2016-01-18 ENCOUNTER — Ambulatory Visit (HOSPITAL_COMMUNITY)
Admission: RE | Admit: 2016-01-18 | Discharge: 2016-01-18 | Disposition: A | Discharge status: Home / Self Care | Payer: Medicare Other | Source: Ambulatory Visit | Attending: Cardiothoracic Surgery | Admitting: Cardiothoracic Surgery

## 2016-01-18 DIAGNOSIS — R918 Other nonspecific abnormal finding of lung field: Secondary | ICD-10-CM

## 2016-01-18 LAB — PULMONARY FUNCTION TEST
DL/VA % pred: 104 %
DL/VA: 4.77 ml/min/mmHg/L
DLCO unc % pred: 79 %
DLCO unc: 25.7 ml/min/mmHg
FEF 25-75 Post: 3.19 L/sec
FEF 25-75 Pre: 3.7 L/sec
FEF2575-%Change-Post: -13 %
FEF2575-%Pred-Post: 143 %
FEF2575-%Pred-Pre: 166 %
FEV1-%Change-Post: 2 %
FEV1-%Pred-Post: 102 %
FEV1-%Pred-Pre: 100 %
FEV1-Post: 3.16 L
FEV1-Pre: 3.07 L
FEV1FVC-%Change-Post: 0 %
FEV1FVC-%Pred-Pre: 113 %
FEV6-%Change-Post: 4 %
FEV6-%Pred-Post: 96 %
FEV6-%Pred-Pre: 92 %
FEV6-Post: 3.82 L
FEV6-Pre: 3.66 L
FEV6FVC-%Change-Post: 0 %
FEV6FVC-%Pred-Post: 106 %
FEV6FVC-%Pred-Pre: 105 %
FVC-%Change-Post: 2 %
FVC-%Pred-Post: 90 %
FVC-%Pred-Pre: 87 %
FVC-Post: 3.82 L
FVC-Pre: 3.71 L
Post FEV1/FVC ratio: 83 %
Post FEV6/FVC ratio: 100 %
Pre FEV1/FVC ratio: 83 %
Pre FEV6/FVC Ratio: 100 %
RV % pred: 72 %
RV: 1.84 L
TLC % pred: 85 %
TLC: 6.04 L

## 2016-01-18 MED ORDER — ALBUTEROL SULFATE (2.5 MG/3ML) 0.083% IN NEBU
2.5000 mg | INHALATION_SOLUTION | Freq: Once | RESPIRATORY_TRACT | Status: AC
  Administered 2016-01-18: 2.5 mg via RESPIRATORY_TRACT

## 2016-01-21 ENCOUNTER — Ambulatory Visit (INDEPENDENT_AMBULATORY_CARE_PROVIDER_SITE_OTHER): Payer: Medicare Other | Admitting: Cardiovascular Disease

## 2016-01-21 ENCOUNTER — Encounter: Payer: Self-pay | Admitting: Cardiovascular Disease

## 2016-01-21 VITALS — BP 126/52 | HR 63 | Ht 70.0 in | Wt 194.2 lb

## 2016-01-21 DIAGNOSIS — E7800 Pure hypercholesterolemia, unspecified: Secondary | ICD-10-CM | POA: Insufficient documentation

## 2016-01-21 DIAGNOSIS — I2510 Atherosclerotic heart disease of native coronary artery without angina pectoris: Secondary | ICD-10-CM | POA: Diagnosis not present

## 2016-01-21 DIAGNOSIS — R011 Cardiac murmur, unspecified: Secondary | ICD-10-CM | POA: Diagnosis not present

## 2016-01-21 DIAGNOSIS — Z01810 Encounter for preprocedural cardiovascular examination: Secondary | ICD-10-CM

## 2016-01-21 DIAGNOSIS — G14 Postpolio syndrome: Secondary | ICD-10-CM

## 2016-01-21 DIAGNOSIS — R918 Other nonspecific abnormal finding of lung field: Secondary | ICD-10-CM

## 2016-01-21 DIAGNOSIS — E785 Hyperlipidemia, unspecified: Secondary | ICD-10-CM

## 2016-01-21 DIAGNOSIS — I10 Essential (primary) hypertension: Secondary | ICD-10-CM

## 2016-01-21 NOTE — Progress Notes (Signed)
Patient ID: James Sweeney, male   DOB: [birth year], 75 y.o.   MRN: [id number]     Cardiology Office Note    Date:  01/21/2016   ID:  James Sweeney, DOB Feb 25, 1941, MRN [id number]  PCP:  Gara Kroner, MD  Cardiologist:   Sanda Klein, MD   Chief Complaint  Patient presents with  . New Evaluation    Reffered by Dr. Thomes Cake on lung  pt c/o SOB--getting worse over the past year, weakness--legs/ankles    History of Present Illness:  James Sweeney is a 75 y.o. male referred in consultation by Dr. Servando Snare for cardiovascular evaluation prior to endoscopic transbronchial ultrasound and biopsy of right lung mass and mediastinal lymph nodes, possible resection of right lung mass.  James Sweeney does not have history of cardiac disease but has numerous coronary risk factors including hypertension, hyperlipidemia and previous smoking. He has been discovered to have a enlarging 1 cm right middle lobe lung nodule as well as hypermetabolic bilateral hilar lymph nodes and a hypermetabolic right adrenal lesion. On the CT of his chest there was also evidence of substantial calcification in the distribution of the LAD and right coronary arteries. He does not have a history of angina pectoris, myocardial infarction, congestive heart failure, peripheral arterial disease, stroke, arrhythmia, valvular heart disease.  He has NYHA functional class II exertional dyspnea. Physical activity is limited by postpolio syndrome (his right leg is a little shorter and he cannot walk on a treadmill for example). He denies angina pectoris, palpitations, syncope, focal neurological deficits, cough, hemoptysis, leg edema, claudication. He is very hard of hearing. He has not had significant weight loss or weight gain. He has chronic low back pain and has been evaluated by Dr. Floyde Parkins. Analgesics were prescribed    Past Medical History  Diagnosis Date  . Hypertension   . Hyperlipemia   .  Kidney stones   . Enlarged prostate   . Cervical spondylosis without myelopathy 12/01/2013  . Lumbago 12/01/2013  . Hearing deficit   . Diabetes mellitus     borderline    Past Surgical History  Procedure Laterality Date  . Cataract extraction Bilateral 2014    Outpatient Prescriptions Prior to Visit  Medication Sig Dispense Refill  . alfuzosin (UROXATRAL) 10 MG 24 hr tablet Take 10 mg by mouth daily.    [name] amLODipine (NORVASC) 10 MG tablet Take 10 mg by mouth daily.    . Ascorbic Acid (VITAMIN C) 100 MG tablet Take 100 mg by mouth daily.    [name] aspirin 81 MG tablet Take 81 mg by mouth daily.    . Cyanocobalamin (VITAMIN B-12) 5000 MCG SUBL Place 1 tablet under the tongue daily.    . finasteride (PROSCAR) 5 MG tablet Take 5 mg by mouth daily.    [name] levofloxacin (LEVAQUIN) 500 MG tablet Take 1 tablet (500 mg total) by mouth daily. 7 tablet 0  . lisinopril-hydrochlorothiazide (PRINZIDE,ZESTORETIC) 20-25 MG per tablet Take 1 tablet by mouth daily.  1  . Omega-3 Fatty Acids (FISH OIL) 1000 MG CAPS Take 1,000 mg by mouth daily.    . simvastatin (ZOCOR) 20 MG tablet Take 20 mg by mouth every evening.    . traMADol (ULTRAM) 50 MG tablet TAKE 1/2 TABLET BY MOUTH TWICE DAILY 90 tablet 0  . vitamin E 400 UNIT capsule Take 400 Units by mouth daily.     No facility-administered medications prior to visit.     Allergies:   Levaquin and  Penicillins   Social History   Social History  . Marital Status: Married    Spouse Name: N/A  . Number of Children: 2  . Years of Education: N/A   Social History Main Topics  . Smoking status: Former Smoker -- 0.50 packs/day for 9 years    Types: Cigarettes    Quit date: 12/02/1967  . Smokeless tobacco: Current User    Types: Chew  . Alcohol Use: No  . Drug Use: No  . Sexual Activity: No   Other Topics Concern  . None   Social History Narrative   Patient is right handed.   Patient drinks 1 cup caffeine daily.      Pt has been married for 35  years and has 2 children, 4 grandchildren, and 3 great-grandchildren. Lives with everyone except great-grandchildren. He does not clean house, but can shop, do yard work, and drive. 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         Epworth Sleepiness Scale Score:  8      --I have HTN   --I seem to be losing my sex drive   --I wake up to urinate frequently at night   --I awake feeling not rested   --I have borderline diabetes     Family History:  The patient's family history includes Cancer in his mother; Cancer - Lung in his father; Diabetes in his brother; Heart attack in his brother.   ROS:   Please see the history of present illness.    ROS All other systems reviewed and are negative.   PHYSICAL EXAM:   VS:  BP 126/52 mmHg  Pulse 63  Ht 5\' 10"  (1.778 m)  Wt 88.089 kg (194 lb 3.2 oz)  BMI 27.86 kg/m2   GEN: Well nourished, well developed, in no acute distress HEENT: normal Neck: no JVD, carotid bruits, or masses Cardiac: RRR; early peaking [id number] systolic ejection murmur in the aortic focus without much radiation, no diastolic murmurs, rubs, or gallops,no edema  Respiratory:  clear to auscultation bilaterally, normal work of breathing GI: soft, nontender, nondistended, + BS MS: no deformity or atrophy Skin: warm and dry, no rash Neuro:  Alert and Oriented x 3, Strength and sensation are intact Psych: euthymic mood, full affect  Wt Readings from Last 3 Encounters:  01/21/16 88.089 kg (194 lb 3.2 oz)  01/15/16 89.359 kg (197 lb)  12/01/14 90.266 kg (199 lb)      Studies/Labs Reviewed:   EKG:  EKG is ordered today.  The ekg ordered today demonstrates normal sinus rhythm, normal tracing, QTC 425 ms  Recent Labs: 05/17/2015: BUN 25*; Creatinine, Ser 1.42*; Hemoglobin 12.3*; Platelets 111*; Potassium 3.5; Sodium 133*   Lipid Panel No results found for: CHOL, TRIG, HDL, CHOLHDL, VLDL, LDLCALC, LDLDIRECT    ASSESSMENT:    1. Preoperative  cardiovascular examination   2. Coronary artery calcification seen on CT scan   3. Murmur   4. Post-polio syndrome   5. Lung mass   6. Essential hypertension   7. Hyperlipidemia      PLAN:  In order of problems listed above:  1. James Sweeney does not have symptoms of active coronary insufficiency or cardiac illness, but functional status assessment is limited by his postpolio syndrome. Defer assessment of surgical risk until after his imaging studies are performed. 2. Coronary calcification: He requires a functional study. Unfortunately he cannot walk on the treadmill and we will order Dexter. 3. Murmur: This  may represent simply aortic valve sclerosis, order echocardiogram for assessment of LV function and to rule out significant aortic stenosis 4. Limited physical activity 5. Right lung mass: Possible primary lung neoplasm with secondary involvement of mediastinal lymph nodes and the adrenal gland. Alternatively, there are some features that suggest possible inflammatory cause of his mediastinal lymphadenopathy. 6. HTN: Well controlled 7. HLP: On statin. To avoid higher doses of simvastatin while taking amlodipine.   I think we should be able to get his echocardiogram and stress test reports before the end of the week without excessive delay in his workup with a pulmonary mass  Medication Adjustments/Labs and Tests Ordered: Current medicines are reviewed at length with the patient today.  Concerns regarding medicines are outlined above.  Medication changes, Labs and Tests ordered today are listed in the Patient Instructions below. Patient Instructions  Your physician has requested that you have an echocardiogram. Echocardiography is a painless test that uses sound waves to create images of your heart. It provides your doctor with information about the size and shape of your heart and how well your heart's chambers and valves are working. This procedure takes approximately one  hour. There are no restrictions for this procedure.  Your physician has requested that you have a lexiscan myoview. For further information please visit [email or web address]. Please follow instruction sheet, as given.  Dr Sallyanne Kuster recommends that you follow-up with him as needed.       Mikael Spray, MD  01/21/2016 6:02 PM    [facility] [place], [place]  [address] Phone: [phone]; Fax: [phone]

## 2016-01-21 NOTE — Patient Instructions (Signed)
Your physician has requested that you have an echocardiogram. Echocardiography is a painless test that uses sound waves to create images of your heart. It provides your doctor with information about the size and shape of your heart and how well your heart's chambers and valves are working. This procedure takes approximately one hour. There are no restrictions for this procedure.  Your physician has requested that you have a lexiscan myoview. For further information please visit [email or web address]. Please follow instruction sheet, as given.  Dr Sallyanne Kuster recommends that you follow-up with him as needed.

## 2016-01-22 ENCOUNTER — Telehealth (HOSPITAL_COMMUNITY): Payer: Self-pay

## 2016-01-22 NOTE — Telephone Encounter (Signed)
Encounter complete. 

## 2016-01-23 ENCOUNTER — Ambulatory Visit (INDEPENDENT_AMBULATORY_CARE_PROVIDER_SITE_OTHER): Payer: Medicare Other | Admitting: Cardiothoracic Surgery

## 2016-01-23 ENCOUNTER — Other Ambulatory Visit: Payer: Self-pay | Admitting: *Deleted

## 2016-01-23 ENCOUNTER — Ambulatory Visit (HOSPITAL_BASED_OUTPATIENT_CLINIC_OR_DEPARTMENT_OTHER)
Admission: RE | Admit: 2016-01-23 | Discharge: 2016-01-23 | Disposition: A | Discharge status: Home / Self Care | Payer: Medicare Other | Source: Ambulatory Visit | Attending: Cardiovascular Disease | Admitting: Cardiovascular Disease

## 2016-01-23 ENCOUNTER — Encounter: Payer: Self-pay | Admitting: Cardiothoracic Surgery

## 2016-01-23 VITALS — BP 132/60 | HR 67 | Resp 16 | Ht 70.0 in | Wt 194.0 lb

## 2016-01-23 DIAGNOSIS — Z87891 Personal history of nicotine dependence: Secondary | ICD-10-CM

## 2016-01-23 DIAGNOSIS — R011 Cardiac murmur, unspecified: Secondary | ICD-10-CM | POA: Diagnosis not present

## 2016-01-23 DIAGNOSIS — D381 Neoplasm of uncertain behavior of trachea, bronchus and lung: Secondary | ICD-10-CM

## 2016-01-23 DIAGNOSIS — I2510 Atherosclerotic heart disease of native coronary artery without angina pectoris: Secondary | ICD-10-CM | POA: Diagnosis not present

## 2016-01-23 DIAGNOSIS — I071 Rheumatic tricuspid insufficiency: Secondary | ICD-10-CM | POA: Insufficient documentation

## 2016-01-23 DIAGNOSIS — R911 Solitary pulmonary nodule: Secondary | ICD-10-CM

## 2016-01-23 DIAGNOSIS — I119 Hypertensive heart disease without heart failure: Secondary | ICD-10-CM | POA: Insufficient documentation

## 2016-01-23 DIAGNOSIS — E785 Hyperlipidemia, unspecified: Secondary | ICD-10-CM

## 2016-01-23 DIAGNOSIS — Z8249 Family history of ischemic heart disease and other diseases of the circulatory system: Secondary | ICD-10-CM | POA: Insufficient documentation

## 2016-01-23 DIAGNOSIS — R0602 Shortness of breath: Secondary | ICD-10-CM

## 2016-01-23 DIAGNOSIS — G14 Postpolio syndrome: Secondary | ICD-10-CM | POA: Diagnosis not present

## 2016-01-23 DIAGNOSIS — E119 Type 2 diabetes mellitus without complications: Secondary | ICD-10-CM

## 2016-01-23 DIAGNOSIS — R5383 Other fatigue: Secondary | ICD-10-CM | POA: Insufficient documentation

## 2016-01-23 DIAGNOSIS — R0609 Other forms of dyspnea: Secondary | ICD-10-CM | POA: Insufficient documentation

## 2016-01-23 LAB — MYOCARDIAL PERFUSION IMAGING
LV dias vol: 142 mL (ref 62–150)
LV sys vol: 61 mL
Peak HR: 78 {beats}/min
Rest HR: 58 {beats}/min
SDS: 0
SRS: 0
SSS: 0
TID: 1.11

## 2016-01-23 LAB — ECHOCARDIOGRAM COMPLETE
Height: 70 in
Weight: 3104 oz

## 2016-01-23 MED ORDER — REGADENOSON 0.4 MG/5ML IV SOLN
0.4000 mg | Freq: Once | INTRAVENOUS | Status: AC
  Administered 2016-01-23: 0.4 mg via INTRAVENOUS

## 2016-01-23 MED ORDER — TECHNETIUM TC 99M SESTAMIBI GENERIC - CARDIOLITE
30.9000 | Freq: Once | INTRAVENOUS | Status: AC | PRN
  Administered 2016-01-23: 30.9 via INTRAVENOUS

## 2016-01-23 MED ORDER — TECHNETIUM TC 99M SESTAMIBI GENERIC - CARDIOLITE
10.2000 | Freq: Once | INTRAVENOUS | Status: AC | PRN
Start: 2016-01-23 — End: 2016-01-23
  Administered 2016-01-23: 10 via INTRAVENOUS

## 2016-01-23 NOTE — Progress Notes (Signed)
James Sweeney       James Sweeney,James Sweeney             [phone]                    James Sweeney Medical Record [id number] Date of Birth: 10-09-1941  Referring: Antony Contras, MD Primary Care: Gara Kroner, MD  Chief Complaint:    Chief Complaint  Patient presents with  . Follow-up    after cardiac clearance and PFT.Marland KitchenMarland KitchenLEXISCAN MYOVIEW and ECHO  01/23/16, PFT 01/18/16    History of Present Illness:    James Sweeney 75 y.o. male is seen in the office  today for enlarging right lung nodule , present on CT of abdomen in June ,2016. Patient quit smoking 40-45 years ago. Repeat ct of chest showed right lung nodule grew 22mm. PET ordered . 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     Current Activity/ Functional Status:  Patient is independent with mobility/ambulation, transfers, ADL's, IADL's.   Zubrod Score: At the time of surgery this patient's most appropriate activity status/level should be described as: []     0    Normal activity, no symptoms [x]     1    Restricted in physical strenuous activity but ambulatory, able to do out light work []     2    Ambulatory and capable of self care, unable to do work activities, up and about               >50 % of waking hours                              []     3    Only limited self care, in bed greater than 50% of waking hours []     4    Completely disabled, no self care, confined to bed or chair []     5    Moribund   Past Medical History  Diagnosis Date  . Hypertension   . Hyperlipemia   . Kidney stones   . Enlarged prostate   . Cervical spondylosis without myelopathy 12/01/2013  . Lumbago 12/01/2013  . Hearing deficit   . Diabetes mellitus     borderline    Past Surgical History  Procedure Laterality Date  . Cataract extraction Bilateral 2014    Family History  Problem Relation Age of Onset  . Cancer Mother     Stomach  . Cancer - Lung Father   . Heart attack  Brother   . Diabetes Brother     Social History   Social History  . Marital Status: Married    Spouse Name: N/A  . Number of Children: 2  . Years of Education: N/A   Occupational History  . Not on file.   Social History Main Topics  . Smoking status: Former Smoker -- 0.50 packs/day for 9 years    Types: Cigarettes    Quit date: 12/02/1967  . Smokeless tobacco: Current User    Types: Chew  . Alcohol Use: No  . Drug Use: No  . Sexual Activity: No   Other Topics Concern  . Not on file   Social History Narrative   Patient is right handed.   Patient drinks 1 cup caffeine daily.      Pt has been married for 53 years and has  2 children, 4 grandchildren, and 3 great-grandchildren. Lives with everyone except great-grandchildren. He does not clean house, but can shop, do yard work, and drive. 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         Epworth Sleepiness Scale Score:  8      --I have HTN   --I seem to be losing my sex drive   --I wake up to urinate frequently at night   --I awake feeling not rested   --I have borderline diabetes    History  Smoking status  . Former Smoker -- 0.50 packs/day for 9 years  . Types: Cigarettes  . Quit date: 12/02/1967  Smokeless tobacco  . Current User  . Types: Chew    History  Alcohol Use No     Allergies  Allergen Reactions  . Levaquin [Levofloxacin In D5w] Nausea Only  . Penicillins Hives    Current Outpatient Prescriptions  Medication Sig Dispense Refill  . alfuzosin (UROXATRAL) 10 MG 24 hr tablet Take 10 mg by mouth daily.    [name] amLODipine (NORVASC) 10 MG tablet Take 10 mg by mouth daily.    . Ascorbic Acid (VITAMIN C) 100 MG tablet Take 100 mg by mouth daily.    [name] aspirin 81 MG tablet Take 81 mg by mouth daily.    . Cyanocobalamin (VITAMIN B-12) 5000 MCG SUBL Place 1 tablet under the tongue daily.    . finasteride (PROSCAR) 5 MG tablet Take 5 mg by mouth daily.    [name]  lisinopril-hydrochlorothiazide (PRINZIDE,ZESTORETIC) 20-25 MG per tablet Take 1 tablet by mouth daily.  1  . Omega-3 Fatty Acids (FISH OIL) 1000 MG CAPS Take 1,000 mg by mouth daily.    . simvastatin (ZOCOR) 20 MG tablet Take 20 mg by mouth every evening.    . traMADol (ULTRAM) 50 MG tablet TAKE 1/2 TABLET BY MOUTH TWICE DAILY 90 tablet 0  . vitamin E 400 UNIT capsule Take 400 Units by mouth daily.     No current facility-administered medications for this visit.      Review of Systems:     Cardiac Review of Systems: Y or N  Chest Pain [   n ]  Resting SOB [n   ] Exertional SOB  [ y ]  Orthopnea [ n ]   Pedal Edema [ n  ]    Palpitations [ n ] Syncope  [n]   Presyncope [n   ]  General Review of Systems: [Y] = yes [  ]=no Constitional: recent weight change [  ];  Wt loss over the last 3 months [   ] anorexia [  ]; fatigue [  ]; nausea [  ]; night sweats [  ]; fever [  ]; or chills [  ];          Dental: poor dentition[  ]; Last Dentist visit:   Eye : blurred vision [  ]; diplopia [   ]; vision changes [  ];  Amaurosis fugax[  ]; Resp: cough [  ];  wheezing[ n ];  hemoptysis[n  ]; shortness of breath[  ]; paroxysmal nocturnal dyspnea[  ]; dyspnea on exertion[ y ]; or orthopnea[  ];  GI:  gallstones[  ], vomiting[  ];  dysphagia[  ]; melena[  ];  hematochezia [  ]; heartburn[  ];   Hx of  Colonoscopy[  ]; GU: kidney stones [  ]; hematuria[  ];   dysuria [  ];  nocturia[  ];  history of     obstruction [  ]; urinary frequency [  ]             Skin: rash, swelling[  ];, hair loss[  ];  peripheral edema[  ];  or itching[  ]; Musculosketetal: myalgias[  ];  joint swelling[  ];  joint erythema[  ];  joint pain[  ];  back pain[  ];  Heme/Lymph: bruising[  ];  bleeding[  ];  anemia[  ];  Neuro: TIA[ n ];  headaches[  ];  stroke[  ];  vertigo[  ];  seizures[  ];   paresthesias[  ];  difficulty walking[  ];  Psych:depression[  ]; anxiety[  ];  Endocrine: diabetes[  ];  thyroid dysfunction[   ];  Immunizations: Flu up to date [  ]; Pneumococcal up to date [  ];  Other:  Physical Exam: BP 132/60 mmHg  Pulse 67  Resp 16  Ht 5\' 10"  (1.778 m)  Wt 194 lb (87.998 kg)  BMI 27.84 kg/m2  SpO2 97%  PHYSICAL EXAMINATION: General appearance: alert, cooperative and appears older than stated age Head: Normocephalic, without obvious abnormality, atraumatic Neck: no adenopathy, no carotid bruit, no JVD, supple, symmetrical, trachea midline and thyroid not enlarged, symmetric, no tenderness/mass/nodules Lymph nodes: Cervical, supraclavicular, and axillary nodes normal. Resp: clear to auscultation bilaterally Back: symmetric, no curvature. ROM normal. No CVA tenderness. Cardio: regular rate and rhythm, S1, S2 normal, no murmur, click, rub or gallop GI: soft, non-tender; bowel sounds normal; no masses,  no organomegaly Extremities: extremities normal, atraumatic, no cyanosis or edema and Homans sign is negative, no sign of DVT Neurologic: Grossly normal  Diagnostic Studies & Laboratory data:     Recent Radiology Findings:   Nm Pet Image Initial (pi) Skull Base To Thigh  01/01/2016  CLINICAL DATA:  Initial treatment strategy for pulmonary nodule. EXAM: NUCLEAR MEDICINE PET SKULL BASE TO THIGH TECHNIQUE: 9.83 mCi F-18 FDG was injected intravenously. Full-ring PET imaging was performed from the skull base to thigh after the radiotracer. CT data was obtained and used for attenuation correction and anatomic localization. FASTING BLOOD GLUCOSE:  Value: 118 mg/dl COMPARISON:  CT chest dated 12/12/2015. CT abdomen pelvis dated 04/16/2015. FINDINGS: NECK No hypermetabolic lymph nodes in the neck. CHEST 12 x 10 mm right middle lobe nodule (series 6/ image 49), max SUV 2.7, suspicious for primary bronchogenic neoplasm. Mild subpleural nodularity in the posterior right upper lobe (series 6/image 19). No focal consolidation. No pleural effusion or pneumothorax. The heart is normal in size. Coronary  atherosclerosis in the LAD and right coronary artery. Atherosclerotic calcifications aortic arch. Thoracic lymphadenopathy, including: --10 mm short axis AP window node (series 4/image 66), max SUV 5.7 --11 mm short axis partially calcified prevascular node (series 4/image 68) --12 mm short axis right hilar node (series 4/image 70), max SUV 6.5 --13 mm short axis partially calcified subcarinal node (series 4/image 32), max SUV 6.9 --11 mm short axis left hilar node (series 4/image 32), max SUV 6.6 Visualized right thyroid is notable for an 18 x 10 mm right thyroid nodule (series 4/image 45). ABDOMEN/PELVIS No abnormal hypermetabolic activity within the liver, pancreas, or spleen. 2.1 x 2.4 cm right adrenal nodule, unchanged from prior 2016 CT abdomen pelvis, max SUV 5.9. Left renal sinus cysts. Atherosclerotic calcification of the abdominal aorta and branch vessels. Prostatomegaly, with enlargement of the central gland which indents the base the bladder. Bladder is mildly thick-walled although underdistended.  Tiny fat containing bilateral inguinal hernias. No hypermetabolic lymph nodes in the abdomen or pelvis. SKELETON No focal hypermetabolic activity to suggest skeletal metastasis. IMPRESSION: 12 x 10 mm hypermetabolic right middle lobe nodule, suspicious for primary bronchogenic neoplasm. Hypermetabolic thoracic lymphadenopathy, most of which is partially calcified, favored to reflect granulomatous disease such as sarcoidosis. Nodal metastases is considered less likely. 2.1 x 2.4 cm hypermetabolic right adrenal nodule, unchanged from June 2016. While technically indeterminate, metastasis not excluded, stability favors a benign etiology such as an adrenal adenoma. Electronically Signed   By: Julian Hy M.D.   On: 01/01/2016 10:47  I have independently reviewed the above radiology studies  and reviewed the findings with the patient.  Recent Lab Findings: Lab Results  Component Value Date   WBC 11.2*  05/17/2015   HGB 12.3* 05/17/2015   HCT 35.2* 05/17/2015   PLT 111* 05/17/2015   GLUCOSE 130* 05/17/2015   NA 133* 05/17/2015   K 3.5 05/17/2015   CL 101 05/17/2015   CREATININE 1.42* 05/17/2015   BUN 25* 05/17/2015   CO2 28 05/17/2015   Chronic Kidney Disease   Stage I     GFR >90  Stage II    GFR 60-89  Stage IIIA GFR 45-59  Stage IIIB GFR 30-44  Stage IV   GFR 15-29  Stage V    GFR  <15  Lab Results  Component Value Date   CREATININE 1.42* 05/17/2015   CrCl cannot be calculated (Patient has no serum creatinine result on file.).  PFT's: FEV1 3.07 100% Dlco 25  79%  Conclusions: The diffusion defect is consistent with a pulmonary vascular process. Anemia cannot be excluded as a potential cause of the diffusion defect without correcting the observed diffusing capacity for hemoglobin. Pulmonary Function Diagnosis: Minimal Diffusion Defect -Pulmonary Vascular  ECHO: LV EF: 65% - 70%  ------------------------------------------------------------------- History: PMH: Dyspnea. Risk factors: Smokless tabacco user. Former tobacco use. Hypertension. Diabetes mellitus. Dyslipidemia.  ------------------------------------------------------------------- Study Conclusions  - Left ventricle: The cavity size was normal. There was mild  concentric hypertrophy. Systolic function was vigorous. The  estimated ejection fraction was in the range of 65% to 70%. Wall  motion was normal; there were no regional wall motion  abnormalities. Doppler parameters are consistent with abnormal  left ventricular relaxation (grade 1 diastolic dysfunction).  There was no evidence of elevated ventricular filling pressure by  Doppler parameters. - Aortic valve: Trileaflet; normal thickness leaflets. Valve area  (Vmax): 1.58 cm^2. - Aortic root: The aortic root was normal in size. - Left atrium: The atrium was mildly dilated. - Right ventricle: Systolic function was normal. -  Right atrium: The atrium was normal in size. - Tricuspid valve: There was trivial regurgitation. - Pulmonary arteries: Systolic pressure was within the normal  range. - Inferior vena cava: The vessel was normal in size. - Pericardium, extracardiac: There was no pericardial effusion.  Transthoracic echocardiography. M-mode, complete 2D, spectral Doppler, and color Doppler. Birthdate: Patient birthdate: October 03, 1941. Age: Patient is 75 yr old. Sex: Gender: male. BMI: 27.8 kg/m^2. Blood pressure: 126/50 Patient status: Outpatient. Study date: Study date: 01/23/2016. Study time: 10:07 AM.  -------------------------------------------------------------------  ------------------------------------------------------------------- Left ventricle: The cavity size was normal. There was mild concentric hypertrophy. Systolic function was vigorous. The estimated ejection fraction was in the range of 65% to 70%. Wall motion was normal; there were no regional wall motion abnormalities. The transmitral flow pattern was normal. The deceleration time of the early transmitral flow velocity was normal. The pulmonary  vein flow pattern was normal. The tissue Doppler parameters were normal. Doppler parameters are consistent with abnormal left ventricular relaxation (grade 1 diastolic dysfunction). There was no evidence of elevated ventricular filling pressure by Doppler parameters.  ------------------------------------------------------------------- Aortic valve: Trileaflet; normal thickness leaflets. Mobility was not restricted. Sclerosis without stenosis. Doppler: Transvalvular velocity was within the normal range. There was no stenosis. There was no regurgitation. Peak velocity ratio of LVOT to aortic valve: 0.62. Valve area (Vmax): 1.58 cm^2. Indexed valve area (Vmax): 0.75 cm^2/m^2. Peak gradient (S): 9 mm  Hg.  ------------------------------------------------------------------- Aorta: Aortic root: The aortic root was normal in size.  ------------------------------------------------------------------- Mitral valve: Structurally normal valve. Mobility was not restricted. Doppler: Transvalvular velocity was within the normal range. There was no evidence for stenosis. There was no regurgitation. Peak gradient (D): 2 mm Hg.  ------------------------------------------------------------------- Left atrium: The atrium was mildly dilated.  ------------------------------------------------------------------- Right ventricle: The cavity size was normal. Wall thickness was normal. Systolic function was normal.  ------------------------------------------------------------------- Pulmonic valve: Doppler: Transvalvular velocity was within the normal range. There was no evidence for stenosis.  ------------------------------------------------------------------- Tricuspid valve: Structurally normal valve. Doppler: Transvalvular velocity was within the normal range. There was trivial regurgitation.  ------------------------------------------------------------------- Pulmonary artery: The main pulmonary artery was normal-sized. Systolic pressure was within the normal range.  ------------------------------------------------------------------- Right atrium: The atrium was normal in size.  ------------------------------------------------------------------- Pericardium: There was no pericardial effusion.  ------------------------------------------------------------------- Systemic veins: Inferior vena cava: The vessel was normal in size.  ------------------------------------------------------------------- Measurements  Left ventricle Value Reference LV ID, ED, PLAX chordal 44.7 mm 43 - 52 LV ID,  ES, PLAX chordal 28.9 mm 23 - 38 LV fx shortening, PLAX chordal 35 % >=29 LV PW thickness, ED 12.9 mm --------- IVS/LV PW ratio, ED 1.02 <=1.3 Stroke volume, 2D 64 ml --------- Stroke volume/bsa, 2D 30 ml/m^2 --------- LV ejection fraction, 1-p A4C 57 % --------- LV end-diastolic volume, 2-p [id number] ml --------- LV end-systolic volume, 2-p 52 ml --------- LV ejection fraction, 2-p 60 % --------- Stroke volume, 2-p 78 ml --------- LV end-diastolic volume/bsa, 2-p 62 ml/m^2 --------- LV end-systolic volume/bsa, 2-p 25 ml/m^2 --------- Stroke volume/bsa, 2-p 37.1 ml/m^2 --------- LV e&', lateral 8.09 cm/s --------- LV E/e&', lateral 9.38 --------- LV s&', lateral 16 cm/s --------- LV e&', medial 8.29 cm/s --------- LV E/e&', medial 9.16 --------- LV e&', average 8.19 cm/s --------- LV E/e&', average 9.27 ---------  Ventricular septum Value Reference IVS thickness, ED 13.1 mm ---------  LVOT Value Reference LVOT ID, S 18 mm --------- LVOT area 2.54 cm^2 --------- LVOT peak velocity, S 93.1 cm/s --------- LVOT mean velocity, S 67 cm/s --------- LVOT VTI,  S 25.2 cm ---------  Aortic valve Value Reference Aortic valve peak velocity, S 150 cm/s --------- Aortic peak gradient, S 9 mm Hg --------- Velocity ratio, peak, LVOT/AV 0.62 --------- Aortic valve area, peak velocity 1.58 cm^2 --------- Aortic valve area/bsa, peak 0.75 cm^2/m^2 --------- velocity  Aorta Value Reference Aortic root ID, ED 31 mm ---------  Left atrium Value Reference LA ID, A-P, ES 42 mm --------- LA ID/bsa, A-P 2 cm/m^2 <=2.2 LA volume, S 44 ml --------- LA volume/bsa, S 20.9 ml/m^2 --------- LA volume, ES, 1-p A4C 44 ml --------- LA volume/bsa, ES, 1-p A4C 20.9 ml/m^2 --------- LA volume, ES, 1-p A2C 42 ml --------- LA volume/bsa, ES, 1-p A2C 20 ml/m^2 ---------  Mitral valve Value Reference Mitral E-wave peak velocity 75.9 cm/s --------- Mitral A-wave peak velocity 103 cm/s --------- Mitral deceleration time (H) 306 ms 150 - 230 Mitral  peak gradient, D 2 mm Hg --------- Mitral E/A ratio, peak 0.8 --------- Mitral maximal regurg velocity, 341 cm/s --------- PISA  Pulmonary arteries Value Reference PA pressure, S, DP 20 mm Hg <=30  Tricuspid valve Value  Reference Tricuspid regurg peak velocity 204 cm/s --------- Tricuspid peak RV-RA gradient 17 mm Hg ---------  Right ventricle Value Reference RV ID, ED, PLAX 30.7 mm 19 - 38  Pulmonic valve Value Reference Pulmonic valve peak velocity, S 80.2 cm/s ---------  Legend: (L) and (H) mark values outside specified reference range.  ------------------------------------------------------------------- Prepared and Electronically Authenticated by  Ena Dawley, M.D. 2017-03-22T12:44:23  Notes Recorded by Sanda Klein, MD on 01/23/2016 at 2:20 PM Both the stress test and the cho are low risk. The aortic valve is sclerotic, but not stenotic. The LVEF is normal. OK to proceed with surgical procedure with Dr. Servando Snare.       Assessment / Plan:   Right middle lobe 1 cm lung nodule enlarging with hypermetabolic right adrenal lesion and hypermetabolic bilateral hilar nodes  With Coronary atherosclerosis in the LAD and right coronary artery and family history of MI in brother Cardiology evaluation has been completed Discuss with patient proceeding with bronchoscopy , ENB to right lung mass and EBUS of mediastinal nodes. Plan for March 24.  Depending on findings consider needle bx of adrenal gland. Case presented and discussed at the multidisciplinary thoracic oncology conference.   [name] MD      [address] [place] [address] Office [phone]   Beeper [phone]  01/23/2016 4:11 PM

## 2016-01-24 ENCOUNTER — Encounter (HOSPITAL_COMMUNITY): Payer: Self-pay | Admitting: *Deleted

## 2016-01-25 ENCOUNTER — Ambulatory Visit (HOSPITAL_COMMUNITY): Payer: Medicare Other | Admitting: Anesthesiology

## 2016-01-25 ENCOUNTER — Encounter (HOSPITAL_COMMUNITY): Payer: Self-pay | Admitting: Certified Registered Nurse Anesthetist

## 2016-01-25 ENCOUNTER — Encounter (HOSPITAL_COMMUNITY)
Admission: AD | Disposition: A | Discharge status: Home / Self Care | Payer: Self-pay | Source: Ambulatory Visit | Attending: Cardiothoracic Surgery

## 2016-01-25 ENCOUNTER — Inpatient Hospital Stay (HOSPITAL_COMMUNITY): Payer: Medicare Other

## 2016-01-25 ENCOUNTER — Inpatient Hospital Stay (HOSPITAL_COMMUNITY)
Admission: AD | Admit: 2016-01-25 | Discharge: 2016-01-28 | DRG: 201 | Disposition: A | Discharge status: Home / Self Care | Payer: Medicare Other | Source: Ambulatory Visit | Attending: Cardiothoracic Surgery | Admitting: Cardiothoracic Surgery

## 2016-01-25 ENCOUNTER — Ambulatory Visit (HOSPITAL_COMMUNITY): Payer: Medicare Other

## 2016-01-25 DIAGNOSIS — E785 Hyperlipidemia, unspecified: Secondary | ICD-10-CM | POA: Diagnosis present

## 2016-01-25 DIAGNOSIS — J95811 Postprocedural pneumothorax: Secondary | ICD-10-CM | POA: Diagnosis present

## 2016-01-25 DIAGNOSIS — D649 Anemia, unspecified: Secondary | ICD-10-CM | POA: Diagnosis present

## 2016-01-25 DIAGNOSIS — H9190 Unspecified hearing loss, unspecified ear: Secondary | ICD-10-CM | POA: Diagnosis present

## 2016-01-25 DIAGNOSIS — R590 Localized enlarged lymph nodes: Secondary | ICD-10-CM | POA: Diagnosis present

## 2016-01-25 DIAGNOSIS — Z938 Other artificial opening status: Secondary | ICD-10-CM

## 2016-01-25 DIAGNOSIS — D696 Thrombocytopenia, unspecified: Secondary | ICD-10-CM | POA: Diagnosis present

## 2016-01-25 DIAGNOSIS — B91 Sequelae of poliomyelitis: Secondary | ICD-10-CM | POA: Diagnosis not present

## 2016-01-25 DIAGNOSIS — Z9689 Presence of other specified functional implants: Secondary | ICD-10-CM

## 2016-01-25 DIAGNOSIS — E119 Type 2 diabetes mellitus without complications: Secondary | ICD-10-CM | POA: Diagnosis present

## 2016-01-25 DIAGNOSIS — Z9889 Other specified postprocedural states: Secondary | ICD-10-CM

## 2016-01-25 DIAGNOSIS — R911 Solitary pulmonary nodule: Secondary | ICD-10-CM

## 2016-01-25 DIAGNOSIS — I10 Essential (primary) hypertension: Secondary | ICD-10-CM | POA: Diagnosis present

## 2016-01-25 DIAGNOSIS — Z8249 Family history of ischemic heart disease and other diseases of the circulatory system: Secondary | ICD-10-CM | POA: Diagnosis not present

## 2016-01-25 DIAGNOSIS — I2510 Atherosclerotic heart disease of native coronary artery without angina pectoris: Secondary | ICD-10-CM | POA: Diagnosis present

## 2016-01-25 DIAGNOSIS — Z4682 Encounter for fitting and adjustment of non-vascular catheter: Secondary | ICD-10-CM

## 2016-01-25 DIAGNOSIS — Z87891 Personal history of nicotine dependence: Secondary | ICD-10-CM

## 2016-01-25 DIAGNOSIS — Z419 Encounter for procedure for purposes other than remedying health state, unspecified: Secondary | ICD-10-CM

## 2016-01-25 DIAGNOSIS — J939 Pneumothorax, unspecified: Secondary | ICD-10-CM | POA: Diagnosis present

## 2016-01-25 HISTORY — DX: Unspecified hearing loss, unspecified ear: H91.90

## 2016-01-25 HISTORY — PX: VIDEO BRONCHOSCOPY WITH ENDOBRONCHIAL NAVIGATION: SHX6175

## 2016-01-25 HISTORY — DX: Reserved for inherently not codable concepts without codable children: IMO0001

## 2016-01-25 HISTORY — PX: VIDEO BRONCHOSCOPY WITH ENDOBRONCHIAL ULTRASOUND: SHX6177

## 2016-01-25 HISTORY — DX: Cardiac murmur, unspecified: R01.1

## 2016-01-25 HISTORY — DX: Acute poliomyelitis, unspecified: A80.9

## 2016-01-25 HISTORY — DX: Pneumonia, unspecified organism: J18.9

## 2016-01-25 HISTORY — DX: Personal history of urinary calculi: Z87.442

## 2016-01-25 LAB — GLUCOSE, CAPILLARY
Glucose-Capillary: 100 mg/dL — ABNORMAL HIGH (ref 65–99)
Glucose-Capillary: 112 mg/dL — ABNORMAL HIGH (ref 65–99)
Glucose-Capillary: 163 mg/dL — ABNORMAL HIGH (ref 65–99)

## 2016-01-25 LAB — COMPREHENSIVE METABOLIC PANEL
ALT: 23 U/L (ref 17–63)
AST: 18 U/L (ref 15–41)
Albumin: 4.2 g/dL (ref 3.5–5.0)
Alkaline Phosphatase: 55 U/L (ref 38–126)
Anion gap: 9 (ref 5–15)
BUN: 22 mg/dL — ABNORMAL HIGH (ref 6–20)
CO2: 25 mmol/L (ref 22–32)
Calcium: 9.1 mg/dL (ref 8.9–10.3)
Chloride: 104 mmol/L (ref 101–111)
Creatinine, Ser: 1.16 mg/dL (ref 0.61–1.24)
GFR calc Af Amer: >60 mL/min (ref >60)
GFR calc non Af Amer: 60 mL/min — ABNORMAL LOW (ref >60)
Glucose, Bld: 116 mg/dL — ABNORMAL HIGH (ref 65–99)
Potassium: 4 mmol/L (ref 3.5–5.1)
Sodium: 138 mmol/L (ref 135–145)
Total Bilirubin: 0.4 mg/dL (ref 0.3–1.2)
Total Protein: 6.4 g/dL — ABNORMAL LOW (ref 6.5–8.1)

## 2016-01-25 LAB — CBC
HCT: 36.9 % — ABNORMAL LOW (ref 39.0–52.0)
Hemoglobin: 12.8 g/dL — ABNORMAL LOW (ref 13.0–17.0)
MCH: 34.1 pg — ABNORMAL HIGH (ref 26.0–34.0)
MCHC: 34.7 g/dL (ref 30.0–36.0)
MCV: 98.4 fL (ref 78.0–100.0)
Platelets: 123 10*3/uL — ABNORMAL LOW (ref 150–400)
RBC: 3.75 MIL/uL — ABNORMAL LOW (ref 4.22–5.81)
RDW: 12.4 % (ref 11.5–15.5)
WBC: 3.9 10*3/uL — ABNORMAL LOW (ref 4.0–10.5)

## 2016-01-25 LAB — APTT: aPTT: 28 seconds (ref 24–37)

## 2016-01-25 LAB — PROTIME-INR
INR: 1.07 (ref 0.00–1.49)
Prothrombin Time: 14.1 seconds (ref 11.6–15.2)

## 2016-01-25 SURGERY — BRONCHOSCOPY, WITH EBUS
Anesthesia: General | Site: Chest

## 2016-01-25 MED ORDER — ACETAMINOPHEN 160 MG/5ML PO SOLN
1000.0000 mg | Freq: Four times a day (QID) | ORAL | Status: DC
  Filled 2016-01-25: qty 40.6

## 2016-01-25 MED ORDER — FENTANYL CITRATE (PF) 250 MCG/5ML IJ SOLN
INTRAMUSCULAR | Status: AC
  Filled 2016-01-25: qty 5

## 2016-01-25 MED ORDER — DEXTROSE 5 % IV SOLN
10.0000 mg | INTRAVENOUS | Status: DC | PRN
Start: 2016-01-25 — End: 2016-01-25
  Administered 2016-01-25: 10 ug/min via INTRAVENOUS

## 2016-01-25 MED ORDER — DEXTROSE-NACL 5-0.45 % IV SOLN
INTRAVENOUS | Status: DC
  Administered 2016-01-25: 22:00:00 via INTRAVENOUS

## 2016-01-25 MED ORDER — LIDOCAINE HCL (PF) 1 % IJ SOLN
INTRAMUSCULAR | Status: AC
  Filled 2016-01-25: qty 30

## 2016-01-25 MED ORDER — ONDANSETRON HCL 4 MG/2ML IJ SOLN
4.0000 mg | Freq: Four times a day (QID) | INTRAMUSCULAR | Status: DC | PRN
  Administered 2016-01-25 – 2016-01-27 (×3): 4 mg via INTRAVENOUS
  Filled 2016-01-25 (×3): qty 2

## 2016-01-25 MED ORDER — SENNOSIDES-DOCUSATE SODIUM 8.6-50 MG PO TABS
1.0000 | ORAL_TABLET | Freq: Every day | ORAL | Status: DC
  Administered 2016-01-25 – 2016-01-27 (×3): 1 via ORAL
  Filled 2016-01-25 (×3): qty 1

## 2016-01-25 MED ORDER — FENTANYL CITRATE (PF) 100 MCG/2ML IJ SOLN
INTRAMUSCULAR | Status: DC | PRN
  Administered 2016-01-25 (×3): 50 ug via INTRAVENOUS
  Administered 2016-01-25: 100 ug via INTRAVENOUS

## 2016-01-25 MED ORDER — ACETAMINOPHEN 500 MG PO TABS
1000.0000 mg | ORAL_TABLET | Freq: Four times a day (QID) | ORAL | Status: DC
  Administered 2016-01-25 – 2016-01-26 (×2): 1000 mg via ORAL
  Filled 2016-01-25 (×3): qty 2

## 2016-01-25 MED ORDER — TRAMADOL HCL 50 MG PO TABS
50.0000 mg | ORAL_TABLET | Freq: Four times a day (QID) | ORAL | Status: DC | PRN
  Administered 2016-01-25: 50 mg via ORAL
  Filled 2016-01-25: qty 1
  Filled 2016-01-25: qty 2

## 2016-01-25 MED ORDER — FENTANYL CITRATE (PF) 100 MCG/2ML IJ SOLN
25.0000 ug | INTRAMUSCULAR | Status: DC | PRN
  Administered 2016-01-25 (×2): 50 ug via INTRAVENOUS

## 2016-01-25 MED ORDER — 0.9 % SODIUM CHLORIDE (POUR BTL) OPTIME
TOPICAL | Status: DC | PRN
  Administered 2016-01-25: 1000 mL

## 2016-01-25 MED ORDER — FENTANYL CITRATE (PF) 100 MCG/2ML IJ SOLN
INTRAMUSCULAR | Status: AC
  Administered 2016-01-25: 50 ug via INTRAVENOUS
  Filled 2016-01-25: qty 2

## 2016-01-25 MED ORDER — PROPOFOL 10 MG/ML IV BOLUS
INTRAVENOUS | Status: AC
  Filled 2016-01-25: qty 20

## 2016-01-25 MED ORDER — PROPOFOL 10 MG/ML IV BOLUS
INTRAVENOUS | Status: DC | PRN
  Administered 2016-01-25: 150 mg via INTRAVENOUS

## 2016-01-25 MED ORDER — ALFUZOSIN HCL ER 10 MG PO TB24
10.0000 mg | ORAL_TABLET | Freq: Every day | ORAL | Status: DC
  Administered 2016-01-25: 10 mg via ORAL
  Filled 2016-01-25 (×4): qty 1

## 2016-01-25 MED ORDER — FINASTERIDE 5 MG PO TABS
5.0000 mg | ORAL_TABLET | Freq: Every day | ORAL | Status: DC
  Administered 2016-01-25 – 2016-01-27 (×3): 5 mg via ORAL
  Filled 2016-01-25 (×3): qty 1

## 2016-01-25 MED ORDER — ROCURONIUM BROMIDE 100 MG/10ML IV SOLN
INTRAVENOUS | Status: DC | PRN
  Administered 2016-01-25 (×2): 10 mg via INTRAVENOUS
  Administered 2016-01-25: 50 mg via INTRAVENOUS

## 2016-01-25 MED ORDER — SUGAMMADEX SODIUM 200 MG/2ML IV SOLN
INTRAVENOUS | Status: AC
  Filled 2016-01-25: qty 2

## 2016-01-25 MED ORDER — OXYCODONE HCL 5 MG PO TABS
5.0000 mg | ORAL_TABLET | ORAL | Status: DC | PRN
  Administered 2016-01-26: 10 mg via ORAL
  Filled 2016-01-25: qty 2

## 2016-01-25 MED ORDER — SUGAMMADEX SODIUM 200 MG/2ML IV SOLN
INTRAVENOUS | Status: DC | PRN
  Administered 2016-01-25: 200 mg via INTRAVENOUS

## 2016-01-25 MED ORDER — EPHEDRINE SULFATE 50 MG/ML IJ SOLN
INTRAMUSCULAR | Status: DC | PRN
  Administered 2016-01-25: 5 mg via INTRAVENOUS

## 2016-01-25 MED ORDER — BISACODYL 5 MG PO TBEC
10.0000 mg | DELAYED_RELEASE_TABLET | Freq: Every day | ORAL | Status: DC
  Administered 2016-01-25 – 2016-01-27 (×3): 10 mg via ORAL
  Filled 2016-01-25 (×3): qty 2

## 2016-01-25 MED ORDER — LIDOCAINE HCL (CARDIAC) 20 MG/ML IV SOLN
INTRAVENOUS | Status: DC | PRN
  Administered 2016-01-25: 60 mg via INTRAVENOUS

## 2016-01-25 MED ORDER — LACTATED RINGERS IV SOLN
INTRAVENOUS | Status: DC
  Administered 2016-01-25 (×2): via INTRAVENOUS

## 2016-01-25 SURGICAL SUPPLY — 41 items
BRUSH BIOPSY BRONCH 10 SDTNB (MISCELLANEOUS) ×2 IMPLANT
BRUSH CYTOL CELLEBRITY 1.5X140 (MISCELLANEOUS) IMPLANT
BRUSH SUPERTRAX BIOPSY (INSTRUMENTS) IMPLANT
BRUSH SUPERTRAX NDL-TIP CYTO (INSTRUMENTS) ×1 IMPLANT
CANISTER SUCTION 2500CC (MISCELLANEOUS) ×4 IMPLANT
CHANNEL WORK EXTEND EDGE 180 (KITS) IMPLANT
CHANNEL WORK EXTEND EDGE 45 (KITS) IMPLANT
CHANNEL WORK EXTEND EDGE 90 (KITS) IMPLANT
CONT SPEC 4OZ CLIKSEAL STRL BL (MISCELLANEOUS) ×5 IMPLANT
COVER DOME SNAP 22 D (MISCELLANEOUS) ×2 IMPLANT
COVER TABLE BACK 60X90 (DRAPES) ×2 IMPLANT
FILTER STRAW FLUID ASPIR (MISCELLANEOUS) IMPLANT
FORCEPS BIOP RJ4 1.8 (CUTTING FORCEPS) IMPLANT
FORCEPS BIOP SUPERTRX PREMAR (INSTRUMENTS) ×1 IMPLANT
GAUZE SPONGE 4X4 12PLY STRL (GAUZE/BANDAGES/DRESSINGS) ×2 IMPLANT
GLOVE BIO SURGEON STRL SZ 6.5 (GLOVE) ×3 IMPLANT
KIT CLEAN ENDO COMPLIANCE (KITS) ×6 IMPLANT
KIT PROCEDURE EDGE 180 (KITS) ×1 IMPLANT
KIT PROCEDURE EDGE 45 (KITS) IMPLANT
KIT PROCEDURE EDGE 90 (KITS) IMPLANT
KIT ROOM TURNOVER OR (KITS) ×4 IMPLANT
MARKER SKIN DUAL TIP RULER LAB (MISCELLANEOUS) ×2 IMPLANT
NDL BIOPSY TRANSBRONCH 21G (NEEDLE) IMPLANT
NDL BLUNT 18X1 FOR OR ONLY (NEEDLE) IMPLANT
NDL ECHOTIP HI DEF 22GA (NEEDLE) IMPLANT
NDL SUPERTRX PREMARK BIOPSY (NEEDLE) IMPLANT
NEEDLE BIOPSY TRANSBRONCH 21G (NEEDLE) IMPLANT
NEEDLE BLUNT 18X1 FOR OR ONLY (NEEDLE) IMPLANT
NEEDLE ECHOTIP HI DEF 22GA (NEEDLE) ×2 IMPLANT
NEEDLE SONO TIP II EBUS (NEEDLE) ×1 IMPLANT
NEEDLE SUPERTRX PREMARK BIOPSY (NEEDLE) ×2 IMPLANT
NS IRRIG 1000ML POUR BTL (IV SOLUTION) ×2 IMPLANT
OIL SILICONE PENTAX (PARTS (SERVICE/REPAIRS)) ×2 IMPLANT
PAD ARMBOARD 7.5X6 YLW CONV (MISCELLANEOUS) ×4 IMPLANT
PATCHES PATIENT (LABEL) ×6 IMPLANT
SYR 20CC LL (SYRINGE) ×2 IMPLANT
SYR 20ML ECCENTRIC (SYRINGE) ×2 IMPLANT
TOWEL OR 17X24 6PK STRL BLUE (TOWEL DISPOSABLE) ×2 IMPLANT
TRAP SPECIMEN MUCOUS 40CC (MISCELLANEOUS) ×2 IMPLANT
TUBE CONNECTING 20X1/4 (TUBING) ×2 IMPLANT
UNDERPAD 30X30 (UNDERPADS AND DIAPERS) ×2 IMPLANT

## 2016-01-25 NOTE — Transfer of Care (Signed)
Immediate Anesthesia Transfer of Care Note  Patient: James Sweeney  Procedure(s) Performed: Procedure(s): VIDEO BRONCHOSCOPY WITH ENDOBRONCHIAL ULTRASOUND (N/A) VIDEO BRONCHOSCOPY WITH ENDOBRONCHIAL NAVIGATION (N/A)  Patient Location: PACU  Anesthesia Type:General  Level of Consciousness: awake, alert , oriented and patient cooperative  Airway & Oxygen Therapy: Patient Spontanous Breathing and Patient connected to face mask oxygen  Post-op Assessment: Report given to RN and Post -op Vital signs reviewed and stable  Post vital signs: Reviewed and stable  Last Vitals:  Filed Vitals:   01/25/16 1144  BP: 100/64  Pulse: 61  Temp: 36.8 C  Resp: 18    Complications: No apparent anesthesia complications

## 2016-01-25 NOTE — Procedures (Signed)
Chest Tube Insertion Procedure Note  Indications:  Clinically significant Pneumothorax  Pre-operative Diagnosis: Pneumothorax  Post-operative Diagnosis: Pneumothorax  Procedure Details  Informed consent was obtained for the procedure, including sedation.  Risks of lung perforation, hemorrhage, arrhythmia, and adverse drug reaction were discussed.   After sterile skin prep, using standard technique, a 20 French tube was placed in the right lateral 6 rib space.  Findings: Air   Estimated Blood Loss:  Minimal         Specimens:  None              Complications:  None; patient tolerated the procedure well.         Disposition: patuient in rr          Condition: stable  Attending Attestation: I performed the procedure. [name] MD      [address] [place] [address] Office [phone]   Carmen

## 2016-01-25 NOTE — Anesthesia Postprocedure Evaluation (Signed)
Anesthesia Post Note  Patient: James Sweeney  Procedure(s) Performed: Procedure(s) (LRB): VIDEO BRONCHOSCOPY WITH ENDOBRONCHIAL ULTRASOUND (N/A) VIDEO BRONCHOSCOPY WITH ENDOBRONCHIAL NAVIGATION (N/A)  Patient location during evaluation: PACU Anesthesia Type: General Level of consciousness: awake and alert Pain management: pain level controlled Vital Signs Assessment: post-procedure vital signs reviewed and stable Respiratory status: spontaneous breathing, nonlabored ventilation, respiratory function stable and patient connected to nasal cannula oxygen Cardiovascular status: blood pressure returned to baseline and stable Postop Assessment: no signs of nausea or vomiting Anesthetic complications: no    Last Vitals:  Filed Vitals:   01/25/16 1144 01/25/16 1655  BP: 100/64   Pulse: 61   Temp: 36.8 C 37.1 C  Resp: 18     Last Pain:  Filed Vitals:   01/25/16 1659  PainSc: Asleep                 Zenaida Deed

## 2016-01-25 NOTE — Anesthesia Procedure Notes (Signed)
Procedure Name: Intubation Date/Time: 01/25/2016 2:45 PM Performed by: Shirlyn Goltz Pre-anesthesia Checklist: Patient identified, Emergency Drugs available, Suction available and Patient being monitored Patient Re-evaluated:Patient Re-evaluated prior to inductionOxygen Delivery Method: Circle system utilized Preoxygenation: Pre-oxygenation with 100% oxygen Intubation Type: IV induction Ventilation: Oral airway inserted - appropriate to patient size Laryngoscope Size: Glidescope and 4 Grade View: Grade I Tube type: Oral Tube size: 8.5 mm Number of attempts: 1 Airway Equipment and Method: Stylet and Video-laryngoscopy Placement Confirmation: ETT inserted through vocal cords under direct vision,  positive ETCO2 and breath sounds checked- equal and bilateral Secured at: 19 cm Tube secured with: Tape Dental Injury: Teeth and Oropharynx as per pre-operative assessment

## 2016-01-25 NOTE — Brief Op Note (Signed)
      [address]       [place] [address]             [phone]      01/25/2016  5:20 PM  PATIENT:  James Sweeney  75 y.o. male  PRE-OPERATIVE DIAGNOSIS:  RIGHT MIDDLE LOBE LUNG NODULE,MEDIASTINAL ADENOPATHY ,RIGHT ADRENAL LESION  POST-OPERATIVE DIAGNOSIS:  RIGHT MIDDLE LOBE LUNG NODULE,MEDIASTINAL ADENOPATHY ,RIGHT ADRENAL LESION  PROCEDURE:  Procedure(s): VIDEO BRONCHOSCOPY WITH ENDOBRONCHIAL ULTRASOUND (N/A) VIDEO BRONCHOSCOPY WITH ENDOBRONCHIAL NAVIGATION (N/A)  SURGEON:  Surgeon(s) and Role:    * [name], MD - Primary   ANESTHESIA:   general  EBL:  Total I/O In: 1500 [I.V.:1500] Out: 5 [Blood:5]  BLOOD ADMINISTERED:none  DRAINS: none   LOCAL MEDICATIONS USED:  LIDOCAINE   SPECIMEN:  Source of Specimen:  right lung lesion and #4 and # 7 nodes   DISPOSITION OF SPECIMEN:  PATHOLOGY  COUNTS:  YES   DICTATION: .Dragon Dictation  PLAN OF CARE: Discharge to home after PACU  PATIENT DISPOSITION:  PACU - hemodynamically stable.   Delay start of Pharmacological VTE agent (>24hrs) due to surgical blood loss or risk of bleeding: yes

## 2016-01-25 NOTE — Anesthesia Preprocedure Evaluation (Addendum)
Anesthesia Evaluation  Patient identified by MRN, date of birth, ID band Patient awake    Reviewed: Allergy & Precautions, H&P , NPO status , Patient's Chart, lab work & pertinent test results  History of Anesthesia Complications Negative for: history of anesthetic complications  Airway Mallampati: II  TM Distance: >3 FB Neck ROM: full    Dental  (+) Dental Advisory Given, Edentulous Upper   Pulmonary shortness of breath and with exertion, former smoker,    Pulmonary exam normal breath sounds clear to auscultation       Cardiovascular hypertension, Pt. on medications + CAD  Normal cardiovascular exam Rhythm:regular Rate:Normal  2017 - Low risk stress test with EF 55%, normal valves   Neuro/Psych History polio negative neurological ROS     GI/Hepatic negative GI ROS, Neg liver ROS,   Endo/Other  diabetes, Well Controlled, Type 2Diet controlled DM  Renal/GU negative Renal ROS     Musculoskeletal   Abdominal   Peds  Hematology negative hematology ROS (+)   Anesthesia Other Findings Cervical spondylosis  Reproductive/Obstetrics negative OB ROS                          Anesthesia Physical Anesthesia Plan  ASA: III  Anesthesia Plan: General   Post-op Pain Management:    Induction: Intravenous  Airway Management Planned: Oral ETT  Additional Equipment:   Intra-op Plan:   Post-operative Plan: Extubation in OR  Informed Consent: I have reviewed the patients History and Physical, chart, labs and discussed the procedure including the risks, benefits and alternatives for the proposed anesthesia with the patient or authorized representative who has indicated his/her understanding and acceptance.   Dental Advisory Given  Plan Discussed with: Anesthesiologist, CRNA and Surgeon  Anesthesia Plan Comments:         Anesthesia Quick Evaluation

## 2016-01-25 NOTE — H&P (Signed)
[facility] [address]       [place] [address]             [phone]                    Kip E Stay [facility] Medical Record [id number] Date of Birth: 01/07/1941  Referring: No ref. provider found Primary Care: Gara Kroner, MD  Chief Complaint:    No chief complaint on file.   History of Present Illness:    James Sweeney 75 y.o. male is seen in the office  today for enlarging right lung nodule , present on CT of abdomen in June ,2016. Patient quit smoking 40-45 years ago. Repeat ct of chest showed right lung nodule grew 34mm. PET ordered . 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     Current Activity/ Functional Status:  Patient is independent with mobility/ambulation, transfers, ADL's, IADL's.   Zubrod Score: At the time of surgery this patient's most appropriate activity status/level should be described as: []     0    Normal activity, no symptoms [x]     1    Restricted in physical strenuous activity but ambulatory, able to do out light work []     2    Ambulatory and capable of self care, unable to do work activities, up and about               >50 % of waking hours                              []     3    Only limited self care, in bed greater than 50% of waking hours []     4    Completely disabled, no self care, confined to bed or chair []     5    Moribund   Past Medical History  Diagnosis Date  . Hypertension   . Hyperlipemia   . Enlarged prostate   . Cervical spondylosis without myelopathy 12/01/2013  . Lumbago 12/01/2013  . Hearing deficit   . Diabetes mellitus     borderline  . Heart murmur   . Shortness of breath dyspnea     with exertion  . History of kidney stones     removed with Lithotripsy  . Polio     right leg smaller than left  . HOH (hard of hearing)     right ear  . Pneumonia 2016    Past Surgical History  Procedure Laterality Date  . Cataract extraction Bilateral 2014    Family History    Problem Relation Age of Onset  . Cancer Mother     Stomach  . Cancer - Lung Father   . Heart attack Brother   . Diabetes Brother     Social History   Social History  . Marital Status: Married    Spouse Name: N/A  . Number of Children: 2  . Years of Education: N/A   Occupational History  . Not on file.   Social History Main Topics  . Smoking status: Former Smoker -- 0.50 packs/day for 9 years    Types: Cigarettes    Quit date: 12/02/1967  . Smokeless tobacco: Current User    Types: Chew  . Alcohol Use: No  . Drug Use: No  . Sexual Activity: No   Other Topics Concern  . Not  on file   Social History Narrative   Patient is right handed.   Patient drinks 1 cup caffeine daily.      Pt has been married for 42 years and has 2 children, 4 grandchildren, and 3 great-grandchildren. Lives with everyone except great-grandchildren. He does not clean house, but can shop, do yard work, and drive. 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         Epworth Sleepiness Scale Score:  8      --I have HTN   --I seem to be losing my sex drive   --I wake up to urinate frequently at night   --I awake feeling not rested   --I have borderline diabetes    History  Smoking status  . Former Smoker -- 0.50 packs/day for 9 years  . Types: Cigarettes  . Quit date: 12/02/1967  Smokeless tobacco  . Current User  . Types: Chew    History  Alcohol Use No     Allergies  Allergen Reactions  . Levaquin [Levofloxacin In D5w] Nausea Only  . Penicillins Hives    Has patient had a PCN reaction causing immediate rash, facial/tongue/throat swelling, SOB or lightheadedness with hypotension: unknown, childhood allergy Has patient had a PCN reaction causing severe rash involving mucus membranes or skin necrosis: No Has patient had a PCN reaction that required hospitalization No Has patient had a PCN reaction occurring within the last 10 years: No If all of the above answers  are "NO", then may proceed with Cephalosporin use.     Current Facility-Administered Medications  Medication Dose Route Frequency Provider Last Rate Last Dose  . 0.9 % irrigation (POUR BTL)    PRN [name], MD   1,000 mL at 01/25/16 1332  . lactated ringers infusion   Intravenous Continuous Rod Mae, MD          Review of Systems:     Cardiac Review of Systems: Y or N  Chest Pain [   n ]  Resting SOB Florencio.Farrier   ] Exertional SOB  [ y ]  Orthopnea [ n ]   Pedal Edema [ n  ]    Palpitations [ n ] Syncope  [n]   Presyncope [n   ]  General Review of Systems: [Y] = yes [  ]=no Constitional: recent weight change [  ];  Wt loss over the last 3 months [   ] anorexia [  ]; fatigue [  ]; nausea [  ]; night sweats [  ]; fever [  ]; or chills [  ];          Dental: poor dentition[  ]; Last Dentist visit:   Eye : blurred vision [  ]; diplopia [   ]; vision changes [  ];  Amaurosis fugax[  ]; Resp: cough [  ];  wheezing[ n ];  hemoptysis[n  ]; shortness of breath[  ]; paroxysmal nocturnal dyspnea[  ]; dyspnea on exertion[ y ]; or orthopnea[  ];  GI:  gallstones[  ], vomiting[  ];  dysphagia[  ]; melena[  ];  hematochezia [  ]; heartburn[  ];   Hx of  Colonoscopy[  ]; GU: kidney stones [  ]; hematuria[  ];   dysuria [  ];  nocturia[  ];  history of     obstruction [  ]; urinary frequency [  ]  Skin: rash, swelling[  ];, hair loss[  ];  peripheral edema[  ];  or itching[  ]; Musculosketetal: myalgias[  ];  joint swelling[  ];  joint erythema[  ];  joint pain[  ];  back pain[  ];  Heme/Lymph: bruising[  ];  bleeding[  ];  anemia[  ];  Neuro: TIA[ n ];  headaches[  ];  stroke[  ];  vertigo[  ];  seizures[  ];   paresthesias[  ];  difficulty walking[  ];  Psych:depression[  ]; anxiety[  ];  Endocrine: diabetes[  ];  thyroid dysfunction[  ];  Immunizations: Flu up to date [  ]; Pneumococcal up to date [  ];  Other:  Physical Exam: BP 100/64 mmHg  Pulse 61  Temp(Src) 98.3 F (36.8  C) (Oral)  Resp 18  Wt 194 lb (87.998 kg)  SpO2 99%  PHYSICAL EXAMINATION: General appearance: alert, cooperative and appears older than stated age Head: Normocephalic, without obvious abnormality, atraumatic Neck: no adenopathy, no carotid bruit, no JVD, supple, symmetrical, trachea midline and thyroid not enlarged, symmetric, no tenderness/mass/nodules Lymph nodes: Cervical, supraclavicular, and axillary nodes normal. Resp: clear to auscultation bilaterally Back: symmetric, no curvature. ROM normal. No CVA tenderness. Cardio: regular rate and rhythm, S1, S2 normal, no murmur, click, rub or gallop GI: soft, non-tender; bowel sounds normal; no masses,  no organomegaly Extremities: extremities normal, atraumatic, no cyanosis or edema and Homans sign is negative, no sign of DVT Neurologic: Grossly normal  Diagnostic Studies & Laboratory data:     Recent Radiology Findings:   Nm Pet Image Initial (pi) Skull Base To Thigh  01/01/2016  CLINICAL DATA:  Initial treatment strategy for pulmonary nodule. EXAM: NUCLEAR MEDICINE PET SKULL BASE TO THIGH TECHNIQUE: 9.83 mCi F-18 FDG was injected intravenously. Full-ring PET imaging was performed from the skull base to thigh after the radiotracer. CT data was obtained and used for attenuation correction and anatomic localization. FASTING BLOOD GLUCOSE:  Value: 118 mg/dl COMPARISON:  CT chest dated 12/12/2015. CT abdomen pelvis dated 04/16/2015. FINDINGS: NECK No hypermetabolic lymph nodes in the neck. CHEST 12 x 10 mm right middle lobe nodule (series 6/ image 49), max SUV 2.7, suspicious for primary bronchogenic neoplasm. Mild subpleural nodularity in the posterior right upper lobe (series 6/image 19). No focal consolidation. No pleural effusion or pneumothorax. The heart is normal in size. Coronary atherosclerosis in the LAD and right coronary artery. Atherosclerotic calcifications aortic arch. Thoracic lymphadenopathy, including: --10 mm short axis AP  window node (series 4/image 66), max SUV 5.7 --11 mm short axis partially calcified prevascular node (series 4/image 68) --12 mm short axis right hilar node (series 4/image 70), max SUV 6.5 --13 mm short axis partially calcified subcarinal node (series 4/image 32), max SUV 6.9 --11 mm short axis left hilar node (series 4/image 32), max SUV 6.6 Visualized right thyroid is notable for an 18 x 10 mm right thyroid nodule (series 4/image 45). ABDOMEN/PELVIS No abnormal hypermetabolic activity within the liver, pancreas, or spleen. 2.1 x 2.4 cm right adrenal nodule, unchanged from prior 2016 CT abdomen pelvis, max SUV 5.9. Left renal sinus cysts. Atherosclerotic calcification of the abdominal aorta and branch vessels. Prostatomegaly, with enlargement of the central gland which indents the base the bladder. Bladder is mildly thick-walled although underdistended. Tiny fat containing bilateral inguinal hernias. No hypermetabolic lymph nodes in the abdomen or pelvis. SKELETON No focal hypermetabolic activity to suggest skeletal metastasis. IMPRESSION: 12 x 10 mm hypermetabolic right middle lobe nodule,  suspicious for primary bronchogenic neoplasm. Hypermetabolic thoracic lymphadenopathy, most of which is partially calcified, favored to reflect granulomatous disease such as sarcoidosis. Nodal metastases is considered less likely. 2.1 x 2.4 cm hypermetabolic right adrenal nodule, unchanged from June 2016. While technically indeterminate, metastasis not excluded, stability favors a benign etiology such as an adrenal adenoma. Electronically Signed   By: Julian Hy M.D.   On: 01/01/2016 10:47  I have independently reviewed the above radiology studies  and reviewed the findings with the patient.  Recent Lab Findings: Lab Results  Component Value Date   WBC 3.9* 01/25/2016   HGB 12.8* 01/25/2016   HCT 36.9* 01/25/2016   PLT 123* 01/25/2016   GLUCOSE 116* 01/25/2016   ALT 23 01/25/2016   AST 18 01/25/2016   NA 138  01/25/2016   K 4.0 01/25/2016   CL 104 01/25/2016   CREATININE 1.16 01/25/2016   BUN 22* 01/25/2016   CO2 25 01/25/2016   INR 1.07 01/25/2016   Chronic Kidney Disease   Stage I     GFR >90  Stage II    GFR 60-89  Stage IIIA GFR 45-59  Stage IIIB GFR 30-44  Stage IV   GFR 15-29  Stage V    GFR  <15  Lab Results  Component Value Date   CREATININE 1.16 01/25/2016   Estimated Creatinine Clearance: 61.5 mL/min (by C-G formula based on Cr of 1.16).  PFT's: FEV1 3.07 100% Dlco 25  79%  Conclusions: The diffusion defect is consistent with a pulmonary vascular process. Anemia cannot be excluded as a potential cause of the diffusion defect without correcting the observed diffusing capacity for hemoglobin. Pulmonary Function Diagnosis: Minimal Diffusion Defect -Pulmonary Vascular  ECHO: LV EF: 65% - 70%  ------------------------------------------------------------------- History: PMH: Dyspnea. Risk factors: Smokless tabacco user. Former tobacco use. Hypertension. Diabetes mellitus. Dyslipidemia.  ------------------------------------------------------------------- Study Conclusions  - Left ventricle: The cavity size was normal. There was mild  concentric hypertrophy. Systolic function was vigorous. The  estimated ejection fraction was in the range of 65% to 70%. Wall  motion was normal; there were no regional wall motion  abnormalities. Doppler parameters are consistent with abnormal  left ventricular relaxation (grade 1 diastolic dysfunction).  There was no evidence of elevated ventricular filling pressure by  Doppler parameters. - Aortic valve: Trileaflet; normal thickness leaflets. Valve area  (Vmax): 1.58 cm^2. - Aortic root: The aortic root was normal in size. - Left atrium: The atrium was mildly dilated. - Right ventricle: Systolic function was normal. - Right atrium: The atrium was normal in size. - Tricuspid valve: There was trivial  regurgitation. - Pulmonary arteries: Systolic pressure was within the normal  range. - Inferior vena cava: The vessel was normal in size. - Pericardium, extracardiac: There was no pericardial effusion.  Transthoracic echocardiography. M-mode, complete 2D, spectral Doppler, and color Doppler. Birthdate: Patient birthdate: 1941/09/28. Age: Patient is 75 yr old. Sex: Gender: male. BMI: 27.8 kg/m^2. Blood pressure: 126/50 Patient status: Outpatient. Study date: Study date: 01/23/2016. Study time: 10:07 AM.  -------------------------------------------------------------------  ------------------------------------------------------------------- Left ventricle: The cavity size was normal. There was mild concentric hypertrophy. Systolic function was vigorous. The estimated ejection fraction was in the range of 65% to 70%. Wall motion was normal; there were no regional wall motion abnormalities. The transmitral flow pattern was normal. The deceleration time of the early transmitral flow velocity was normal. The pulmonary vein flow pattern was normal. The tissue Doppler parameters were normal. Doppler parameters are consistent with abnormal left  ventricular relaxation (grade 1 diastolic dysfunction). There was no evidence of elevated ventricular filling pressure by Doppler parameters.  ------------------------------------------------------------------- Aortic valve: Trileaflet; normal thickness leaflets. Mobility was not restricted. Sclerosis without stenosis. Doppler: Transvalvular velocity was within the normal range. There was no stenosis. There was no regurgitation. Peak velocity ratio of LVOT to aortic valve: 0.62. Valve area (Vmax): 1.58 cm^2. Indexed valve area (Vmax): 0.75 cm^2/m^2. Peak gradient (S): 9 mm Hg.  ------------------------------------------------------------------- Aorta: Aortic root: The aortic root was normal in  size.  ------------------------------------------------------------------- Mitral valve: Structurally normal valve. Mobility was not restricted. Doppler: Transvalvular velocity was within the normal range. There was no evidence for stenosis. There was no regurgitation. Peak gradient (D): 2 mm Hg.  ------------------------------------------------------------------- Left atrium: The atrium was mildly dilated.  ------------------------------------------------------------------- Right ventricle: The cavity size was normal. Wall thickness was normal. Systolic function was normal.  ------------------------------------------------------------------- Pulmonic valve: Doppler: Transvalvular velocity was within the normal range. There was no evidence for stenosis.  ------------------------------------------------------------------- Tricuspid valve: Structurally normal valve. Doppler: Transvalvular velocity was within the normal range. There was trivial regurgitation.  ------------------------------------------------------------------- Pulmonary artery: The main pulmonary artery was normal-sized. Systolic pressure was within the normal range.  ------------------------------------------------------------------- Right atrium: The atrium was normal in size.  ------------------------------------------------------------------- Pericardium: There was no pericardial effusion.  ------------------------------------------------------------------- Systemic veins: Inferior vena cava: The vessel was normal in size.  ------------------------------------------------------------------- Measurements  Left ventricle Value Reference LV ID, ED, PLAX chordal 44.7 mm 43 - 52 LV ID, ES, PLAX chordal 28.9 mm 23 - 38 LV fx shortening, PLAX chordal 35 % >=29 LV PW  thickness, ED 12.9 mm --------- IVS/LV PW ratio, ED 1.02 <=1.3 Stroke volume, 2D 64 ml --------- Stroke volume/bsa, 2D 30 ml/m^2 --------- LV ejection fraction, 1-p A4C 57 % --------- LV end-diastolic volume, 2-p [id number] ml --------- LV end-systolic volume, 2-p 52 ml --------- LV ejection fraction, 2-p 60 % --------- Stroke volume, 2-p 78 ml --------- LV end-diastolic volume/bsa, 2-p 62 ml/m^2 --------- LV end-systolic volume/bsa, 2-p 25 ml/m^2 --------- Stroke volume/bsa, 2-p 37.1 ml/m^2 --------- LV e&', lateral 8.09 cm/s --------- LV E/e&', lateral 9.38 --------- LV s&', lateral 16 cm/s --------- LV e&', medial 8.29 cm/s --------- LV E/e&', medial 9.16 --------- LV e&', average 8.19 cm/s --------- LV E/e&', average 9.27 ---------  Ventricular septum Value Reference IVS thickness, ED 13.1 mm ---------  LVOT Value Reference LVOT ID, S 18 mm --------- LVOT area 2.54 cm^2 --------- LVOT peak velocity, S 93.1 cm/s --------- LVOT mean velocity, S 67 cm/s --------- LVOT VTI, S 25.2 cm ---------  Aortic valve Value Reference Aortic  valve peak velocity, S 150 cm/s --------- Aortic peak gradient, S 9 mm Hg --------- Velocity ratio, peak, LVOT/AV 0.62 --------- Aortic valve area, peak velocity 1.58 cm^2 --------- Aortic valve area/bsa, peak 0.75 cm^2/m^2 --------- velocity  Aorta Value Reference Aortic root ID, ED 31 mm ---------  Left atrium Value Reference LA ID, A-P, ES 42 mm --------- LA ID/bsa, A-P 2 cm/m^2 <=2.2 LA volume, S 44 ml --------- LA volume/bsa, S 20.9 ml/m^2 --------- LA volume, ES, 1-p A4C 44 ml --------- LA volume/bsa, ES, 1-p A4C 20.9 ml/m^2 --------- LA volume, ES, 1-p A2C 42 ml --------- LA volume/bsa, ES, 1-p A2C 20 ml/m^2 ---------  Mitral valve Value Reference Mitral E-wave peak velocity 75.9 cm/s --------- Mitral A-wave peak velocity 103 cm/s --------- Mitral deceleration time (H) 306 ms 150 - 230 Mitral peak gradient, D 2 mm Hg --------- Mitral E/A ratio, peak 0.8 --------- Mitral maximal regurg velocity, 341  cm/s --------- PISA  Pulmonary arteries Value Reference PA pressure, S, DP 20 mm Hg <=30  Tricuspid valve Value Reference Tricuspid regurg peak velocity 204 cm/s --------- Tricuspid peak RV-RA gradient 17 mm Hg  ---------  Right ventricle Value Reference RV ID, ED, PLAX 30.7 mm 19 - 38  Pulmonic valve Value Reference Pulmonic valve peak velocity, S 80.2 cm/s ---------  Legend: (L) and (H) mark values outside specified reference range.  ------------------------------------------------------------------- Prepared and Electronically Authenticated by  Ena Dawley, M.D. 2017-03-22T12:44:23  Notes Recorded by Sanda Klein, MD on 01/23/2016 at 2:20 PM Both the stress test and the cho are low risk. The aortic valve is sclerotic, but not stenotic. The LVEF is normal. OK to proceed with surgical procedure with Dr. Servando Snare.       Assessment / Plan:   Right middle lobe 1 cm lung nodule enlarging with hypermetabolic right adrenal lesion and hypermetabolic bilateral hilar nodes  With Coronary atherosclerosis in the LAD and right coronary artery and family history of MI in brother Cardiology evaluation has been completed Discussed  with patient proceeding with bronchoscopy , ENB to right lung mass and EBUS of mediastinal nodes.   Depending on findings consider needle bx of adrenal gland. Case presented and discussed at the multidisciplinary thoracic oncology conference.  The goals risks and alternatives of the planned surgical procedure bronchoscopy , ENB to right lung mass and EBUS of mediastinal nodes have been discussed with the patient in detail. The risks of the procedure including death, infection, stroke, myocardial infarction, bleeding, blood transfusion have all been discussed specifically.  I have quoted Benedetto Coons a 1 % of perioperative mortality and a complication rate as high as 10 %. The patient's questions have been answered.James Sweeney is willing  to proceed with the planned procedure.  [name] MD      [address] [place] [address] Office [phone]   Beeper [id number]  01/25/2016 1:57 PM

## 2016-01-25 NOTE — Progress Notes (Signed)
Utilization review completed.  L. J. Norlan Rann RN, BSN, CM 

## 2016-01-25 NOTE — Progress Notes (Signed)
In recovery room chest xray done in follow, noted right ptx.  Permission obstined from wife to place right chest tube Patient to stay in hospital  James Sweeney

## 2016-01-26 ENCOUNTER — Inpatient Hospital Stay (HOSPITAL_COMMUNITY): Payer: Medicare Other

## 2016-01-26 LAB — CBC
HCT: 35 % — ABNORMAL LOW (ref 39.0–52.0)
Hemoglobin: 12.5 g/dL — ABNORMAL LOW (ref 13.0–17.0)
MCH: 35.4 pg — ABNORMAL HIGH (ref 26.0–34.0)
MCHC: 35.7 g/dL (ref 30.0–36.0)
MCV: 99.2 fL (ref 78.0–100.0)
Platelets: 113 10*3/uL — ABNORMAL LOW (ref 150–400)
RBC: 3.53 MIL/uL — ABNORMAL LOW (ref 4.22–5.81)
RDW: 12.3 % (ref 11.5–15.5)
WBC: 7.7 10*3/uL (ref 4.0–10.5)

## 2016-01-26 LAB — BASIC METABOLIC PANEL
Anion gap: 9 (ref 5–15)
BUN: 23 mg/dL — ABNORMAL HIGH (ref 6–20)
CO2: 27 mmol/L (ref 22–32)
Calcium: 8.8 mg/dL — ABNORMAL LOW (ref 8.9–10.3)
Chloride: 102 mmol/L (ref 101–111)
Creatinine, Ser: 1.23 mg/dL (ref 0.61–1.24)
GFR calc Af Amer: >60 mL/min (ref >60)
GFR calc non Af Amer: 56 mL/min — ABNORMAL LOW (ref >60)
Glucose, Bld: 199 mg/dL — ABNORMAL HIGH (ref 65–99)
Potassium: 4.1 mmol/L (ref 3.5–5.1)
Sodium: 138 mmol/L (ref 135–145)

## 2016-01-26 MED ORDER — OMEGA-3-ACID ETHYL ESTERS 1 G PO CAPS
1.0000 g | ORAL_CAPSULE | Freq: Every day | ORAL | Status: DC
  Administered 2016-01-26 – 2016-01-27 (×2): 1 g via ORAL
  Filled 2016-01-26 (×2): qty 1

## 2016-01-26 MED ORDER — ASPIRIN EC 81 MG PO TBEC
81.0000 mg | DELAYED_RELEASE_TABLET | Freq: Every day | ORAL | Status: DC
  Administered 2016-01-26 – 2016-01-27 (×2): 81 mg via ORAL
  Filled 2016-01-26 (×2): qty 1

## 2016-01-26 MED ORDER — TRAMADOL HCL 50 MG PO TABS
50.0000 mg | ORAL_TABLET | Freq: Four times a day (QID) | ORAL | Status: DC | PRN
  Administered 2016-01-26 (×2): 50 mg via ORAL
  Administered 2016-01-27: 25 mg via ORAL
  Filled 2016-01-26 (×3): qty 1

## 2016-01-26 MED ORDER — ACETAMINOPHEN 325 MG PO TABS
650.0000 mg | ORAL_TABLET | Freq: Four times a day (QID) | ORAL | Status: DC | PRN
  Administered 2016-01-27: 650 mg via ORAL
  Filled 2016-01-26: qty 2

## 2016-01-26 MED ORDER — SIMVASTATIN 20 MG PO TABS
20.0000 mg | ORAL_TABLET | Freq: Every day | ORAL | Status: DC
  Administered 2016-01-26 – 2016-01-27 (×2): 20 mg via ORAL
  Filled 2016-01-26 (×2): qty 1

## 2016-01-26 MED ORDER — ALFUZOSIN HCL ER 10 MG PO TB24
10.0000 mg | ORAL_TABLET | Freq: Every day | ORAL | Status: DC
  Administered 2016-01-26 – 2016-01-27 (×2): 10 mg via ORAL
  Filled 2016-01-26 (×2): qty 1

## 2016-01-26 MED ORDER — ONDANSETRON HCL 4 MG/2ML IJ SOLN: 4.0000 mg | Freq: Once | INTRAMUSCULAR | Status: DC | PRN

## 2016-01-26 MED ORDER — FENTANYL CITRATE (PF) 100 MCG/2ML IJ SOLN: 25.0000 ug | INTRAMUSCULAR | Status: DC | PRN

## 2016-01-26 MED ORDER — AMLODIPINE BESYLATE 10 MG PO TABS: 10.0000 mg | ORAL_TABLET | Freq: Every day | ORAL | Status: DC

## 2016-01-26 MED ORDER — VITAMIN C 500 MG PO TABS
1000.0000 mg | ORAL_TABLET | Freq: Every day | ORAL | Status: DC
  Administered 2016-01-26 – 2016-01-27 (×2): 1000 mg via ORAL
  Filled 2016-01-26 (×2): qty 2

## 2016-01-26 MED ORDER — LISINOPRIL 10 MG PO TABS
10.0000 mg | ORAL_TABLET | Freq: Every day | ORAL | Status: DC
  Administered 2016-01-26 – 2016-01-27 (×2): 10 mg via ORAL
  Filled 2016-01-26 (×2): qty 1

## 2016-01-26 NOTE — Progress Notes (Addendum)
      [address]       [place] [address]             [phone]       1 Day Post-Op Procedure(s) (LRB): VIDEO BRONCHOSCOPY WITH ENDOBRONCHIAL ULTRASOUND (N/A) VIDEO BRONCHOSCOPY WITH ENDOBRONCHIAL NAVIGATION (N/A)  Subjective: He has some nausea. He thinks it is from the Oxy as he usually takes Ultram. He is also concerned that some of his home medications were not ordered and "he is on a schedule".  Objective: Vital signs in last 24 hours: Temp:  [97.4 F (36.3 C)-98.8 F (37.1 C)] 97.4 F (36.3 C) (03/25 0305) Pulse Rate:  [58-69] 66 (03/25 0305) Cardiac Rhythm:  [-] Normal sinus rhythm (03/25 0700) Resp:  [12-20] 16 (03/25 0305) BP: (114-151)/(38-95) 118/38 mmHg (03/25 0305) SpO2:  [93 %-100 %] 99 % (03/25 0305)     Intake/Output from previous day: 03/24 0701 - 03/25 0700 In: 2180 [P.O.:480; I.V.:1700] Out: 210 [Urine:200; Blood:5; Chest Tube:5]   Physical Exam:  Cardiovascular: RRR Pulmonary: Clear to auscultation bilaterally; no rales, wheezes, or rhonchi. Abdomen: Soft, non tender, bowel sounds present. Chest Tube: to suction, no air leak  Lab Results: CBC: Recent Labs  01/25/16 1219 01/26/16 0229  WBC 3.9* 7.7  HGB 12.8* 12.5*  HCT 36.9* 35.0*  PLT 123* 113*   BMET:  Recent Labs  01/25/16 1219 01/26/16 0229  NA 138 138  K 4.0 4.1  CL 104 102  CO2 25 27  GLUCOSE 116* 199*  BUN 22* 23*  CREATININE 1.16 1.23  CALCIUM 9.1 8.8*    PT/INR:  Recent Labs  01/25/16 1219  LABPROT 14.1  INR 1.07   ABG:  INR: Will add last result for INR, ABG once components are confirmed Will add last 4 CBG results once components are confirmed  Assessment/Plan:  1. CV - SR in the 60's. Will restart low dose Lisinopril. He has NOT been taking Norvasc for awhile so will not restart. 2.  Pulmonary - s/p right chest tube for iatrogenic ptx following EBUS. On 2 liters of oxygen via [place]. Will wean as tolerates.Chest tube is to suction and  there is no air leak.  CXR shows small right sided pneumothorax. I placed chest tube to water seal. Check CXR in am. 3. Anemia-H and H stable at 12.5 and 35 4. Mild thrombocytopenia-platelets 113,000 5. Regarding pain control, will stop Oxy and give Ultram. Hopefully, this will help with nausea. Also, at his request, stopped scheduled Tylenol because gives him a headache. 6. GI-decrease IVF as is tolerating diet. Had some nausea after Oxy which was stopped. 7. I reviewed home medication list and ordered what was appropriate.   ZIMMERMAN,DONIELLE MPA-C 01/26/2016,12:05 PM  Agree with above

## 2016-01-26 NOTE — Progress Notes (Signed)
Pt received from PACU to rm 2w33, alert and oriented, vitals stable. Pt oriented to the unit and room, call bell within reach. CT to -20 sx, no leak.  Report in regards to pt current medical status received from PACU RN, will continue to monitor pt.

## 2016-01-27 ENCOUNTER — Inpatient Hospital Stay (HOSPITAL_COMMUNITY): Payer: Medicare Other

## 2016-01-27 LAB — CBC
HCT: 36.1 % — ABNORMAL LOW (ref 39.0–52.0)
Hemoglobin: 12 g/dL — ABNORMAL LOW (ref 13.0–17.0)
MCH: 33.7 pg (ref 26.0–34.0)
MCHC: 33.2 g/dL (ref 30.0–36.0)
MCV: 101.4 fL — ABNORMAL HIGH (ref 78.0–100.0)
Platelets: 113 10*3/uL — ABNORMAL LOW (ref 150–400)
RBC: 3.56 MIL/uL — ABNORMAL LOW (ref 4.22–5.81)
RDW: 12.5 % (ref 11.5–15.5)
WBC: 5.4 10*3/uL (ref 4.0–10.5)

## 2016-01-27 MED ORDER — FENTANYL CITRATE (PF) 100 MCG/2ML IJ SOLN
12.5000 ug | Freq: Once | INTRAMUSCULAR | Status: AC
  Administered 2016-01-27: 12.5 ug via INTRAVENOUS
  Filled 2016-01-27: qty 2

## 2016-01-27 MED ORDER — PROMETHAZINE HCL 25 MG/ML IJ SOLN
6.2500 mg | Freq: Four times a day (QID) | INTRAMUSCULAR | Status: DC | PRN
  Administered 2016-01-27: 12.5 mg via INTRAVENOUS
  Filled 2016-01-27: qty 1

## 2016-01-27 NOTE — Discharge Summary (Signed)
Physician Discharge Summary       [address]       [place] [address]             [phone]    Patient ID: James Sweeney MRN: [id number] DOB/AGE: 75-03-1941 75 y.o.  Admit date: 01/25/2016 Discharge date: 01/28/2016  Admission Diagnoses: 1. Right middle lobe lung nodule 2. Mediastinal adenopathy 3. Right adrenal lesion 4.Right pneumothorax after ENB and EBUS  Discharge Diagnoses:  1.Hypertension 2.Hyperlipemia 3.History of remote tobacco abuse 4.Enlarged prostate 5.Cervical spondylosis without myelopathy 6.Hearing deficit 7.Borderline diabetes mellitus 8.History of kidney stones 9. Polio(right leg smaller than left) 10. Pneumonia 11. Lumbago  Procedure (s):  1. VIDEO BRONCHOSCOPY WITH ENDOBRONCHIAL ULTRASOUND  VIDEO BRONCHOSCOPY WITH ENDOBRONCHIAL NAVIGATION by Dr. Servando Snare on 01/25/2016. 2. A 20 French tube was placed in the right lateral 6 rib space by Dr. Servando Snare on 01/25/2016.   History of Presenting Illness: This is a 75 y.o. male is seen in the office for enlarging right lung nodule , present on CT of abdomen in June 2016. The patient quit smoking 40-45 years ago. Repeat CT of the chest showed right lung nodule grew 68mm. PET scan was ordered . 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 With Coronary atherosclerosis in the LAD and right coronary artery and family history of MI in brother Cardiology evaluation has been completed Dr. Servando Snare discussed with patient proceeding with bronchoscopy , ENB to right lung mass and EBUS of mediastinal nodes.Depending on findings, consider needle biopsy of adrenal gland. Patient was presented and discussed at the multidisciplinary thoracic oncology conference.Potential risks, benefits, and complications of the surgery were discussed with the patient and he agreed to proceed. He underwent bronchoscopy, endobronchial [name] and navigation on 01/25/2016.  Brief Hospital  Course:  Follow up chest x ray after surgery revelaed a right pneumothorax. As a result, patient had to have a right chest tube placed. Follow up chest x ray showed re expansion of the right lung. Chest tube was placed to suction. Daily chest x rays were obtained and remained stable. He remained afebrile and hemodynamically stable. He did have nausea. Oxy was stopped and he was given Ultram;however, he continued to have nausea. Ultram was stopped and Zofran, which was not helping, was changed to Phenergan. Chest tube was placed to water seal on 03/25. Chest x ray today is stable and will be removed. Provided chest x ray in am is stable and nausea is resolved, he will be surgically stable for discharge in am.   Latest Vital Signs: Blood pressure 155/58, pulse 79, temperature 98.7 F (37.1 C), temperature source Oral, resp. rate 14, weight 194 lb (87.998 kg), SpO2 97 %.  Physical Exam: Cardiovascular: RRR Pulmonary: Clear to auscultation bilaterally; no rales, wheezes, or rhonchi. Abdomen: Soft, non tender, bowel sounds present. Chest Tube: to water seal, NO air leak  Discharge Condition:Stable and discharged to home  Recent laboratory studies:  Lab Results  Component Value Date   WBC 5.4 01/27/2016   HGB 12.0* 01/27/2016   HCT 36.1* 01/27/2016   MCV 101.4* 01/27/2016   PLT 113* 01/27/2016   Lab Results  Component Value Date   NA 138 01/26/2016   K 4.1 01/26/2016   CL 102 01/26/2016   CO2 27 01/26/2016   CREATININE 1.23 01/26/2016   GLUCOSE 199* 01/26/2016    Diagnostic Studies:  Nm Pet Image Initial (pi) Skull Base To Thigh  01/01/2016  CLINICAL  DATA:  Initial treatment strategy for pulmonary nodule. EXAM: NUCLEAR MEDICINE PET SKULL BASE TO THIGH TECHNIQUE: 9.83 mCi F-18 FDG was injected intravenously. Full-ring PET imaging was performed from the skull base to thigh after the radiotracer. CT data was obtained and used for attenuation correction and anatomic localization. FASTING  BLOOD GLUCOSE:  Value: 118 mg/dl COMPARISON:  CT chest dated 12/12/2015. CT abdomen pelvis dated 04/16/2015. FINDINGS: NECK No hypermetabolic lymph nodes in the neck. CHEST 12 x 10 mm right middle lobe nodule (series 6/ image 49), max SUV 2.7, suspicious for primary bronchogenic neoplasm. Mild subpleural nodularity in the posterior right upper lobe (series 6/image 19). No focal consolidation. No pleural effusion or pneumothorax. The heart is normal in size. Coronary atherosclerosis in the LAD and right coronary artery. Atherosclerotic calcifications aortic arch. Thoracic lymphadenopathy, including: --10 mm short axis AP window node (series 4/image 66), max SUV 5.7 --11 mm short axis partially calcified prevascular node (series 4/image 68) --12 mm short axis right hilar node (series 4/image 70), max SUV 6.5 --13 mm short axis partially calcified subcarinal node (series 4/image 32), max SUV 6.9 --11 mm short axis left hilar node (series 4/image 32), max SUV 6.6 Visualized right thyroid is notable for an 18 x 10 mm right thyroid nodule (series 4/image 45). ABDOMEN/PELVIS No abnormal hypermetabolic activity within the liver, pancreas, or spleen. 2.1 x 2.4 cm right adrenal nodule, unchanged from prior 2016 CT abdomen pelvis, max SUV 5.9. Left renal sinus cysts. Atherosclerotic calcification of the abdominal aorta and branch vessels. Prostatomegaly, with enlargement of the central gland which indents the base the bladder. Bladder is mildly thick-walled although underdistended. Tiny fat containing bilateral inguinal hernias. No hypermetabolic lymph nodes in the abdomen or pelvis. SKELETON No focal hypermetabolic activity to suggest skeletal metastasis. IMPRESSION: 12 x 10 mm hypermetabolic right middle lobe nodule, suspicious for primary bronchogenic neoplasm. Hypermetabolic thoracic lymphadenopathy, most of which is partially calcified, favored to reflect granulomatous disease such as sarcoidosis. Nodal metastases is  considered less likely. 2.1 x 2.4 cm hypermetabolic right adrenal nodule, unchanged from June 2016. While technically indeterminate, metastasis not excluded, stability favors a benign etiology such as an adrenal adenoma. Electronically Signed   By: Julian Hy M.D.   On: 01/01/2016 10:47   Dg Chest Port 1 View  01/27/2016  CLINICAL DATA:  Pneumothorax. Per patient no chest complains this morning, but he has been vomiting since 2 am this morning. EXAM: PORTABLE CHEST 1 VIEW COMPARISON:  01/26/2016. FINDINGS: Cardiomegaly. RIGHT chest tube. RIGHT pneumothorax is improved/resolved, with no visible extrapleural air. Mild bibasilar atelectasis is stable. IMPRESSION: Improved/resolved pneumothorax. Electronically Signed   By: Staci Righter M.D.   On: 01/27/2016 07:24        Discharge Instructions    Discharge after consulting MD assesses patient    Complete by:  As directed            Discharge Medications:   Medication List    TAKE these medications        acetaminophen 325 MG tablet  Commonly known as:  TYLENOL  Take 2 tablets (650 mg total) by mouth every 6 (six) hours as needed for mild pain or fever.     alfuzosin 10 MG 24 hr tablet  Commonly known as:  UROXATRAL  Take 10 mg by mouth daily.     amLODipine 10 MG tablet  Commonly known as:  NORVASC  Take 10 mg by mouth daily.     aspirin 81 MG tablet  Take 81 mg by mouth daily.     aspirin 81 MG EC tablet  Take 1 tablet (81 mg total) by mouth daily.     finasteride 5 MG tablet  Commonly known as:  PROSCAR  Take 5 mg by mouth daily.     Fish Oil 1000 MG Caps  Take 1,000 mg by mouth daily.     lisinopril-hydrochlorothiazide 20-25 MG tablet  Commonly known as:  PRINZIDE,ZESTORETIC  Take 1 tablet by mouth daily.     simvastatin 20 MG tablet  Commonly known as:  ZOCOR  Take 20 mg by mouth every evening.     SYSTANE BALANCE 0.6 % Soln  Generic drug:  Propylene Glycol  Place 1 drop into both eyes every other day.  Every other night before bed     traMADol 50 MG tablet  Commonly known as:  ULTRAM  Take 1 tablet (50 mg total) by mouth every 6 (six) hours as needed.     Vitamin B-12 5000 MCG Subl  Place 1 tablet under the tongue daily.     vitamin C 100 MG tablet  Take 1,000 mg by mouth daily.     vitamin E 400 UNIT capsule  Take 400 Units by mouth daily.        Follow Up Appointments: Follow-up Information    Follow up with [name], MD. Call today.   Specialty:  Cardiothoracic Surgery   Why:  PA/LAT CXR to be taken (at [facility] which is in the same building as Dr. Everrett Coombe office) one hour prior to office appointment.Office will call with appointment time and date.   Contact information:   [address] [place] [address] [phone]       Signed: Cinda Quest 01/28/2016, 8:16 AM

## 2016-01-27 NOTE — Progress Notes (Signed)
Tramadol 25mg  given per pt request.  25mg  wasted, witnessed by Dola Factor RN.

## 2016-01-27 NOTE — Discharge Instructions (Signed)
Pneumothorax °A pneumothorax, commonly called a collapsed lung, is a condition in which air leaks from a lung and builds up in the space between the lung and the chest wall (pleural space). The air in a pneumothorax is trapped outside the lung and takes up space, preventing the lung from fully expanding. This is a condition that usually occurs suddenly. The buildup of air may be small or large. A small pneumothorax may go away on its own. When a pneumothorax is larger, it will often require medical treatment and hospitalization.  °CAUSES  °A pneumothorax can sometimes happen quickly with no apparent cause. People with underlying lung problems, particularly COPD or emphysema, are at higher risk of pneumothorax. However, pneumothorax can happen quickly even in people with no prior known lung problems. Trauma, surgery, medical procedures, or injury to the chest wall can also cause a pneumothorax. °SIGNS AND SYMPTOMS  °Sometimes a pneumothorax will have no symptoms. When symptoms are present, they can include: °· Chest pain. °· Shortness of breath. °· Increased rate of breathing. °· Bluish color to your lips or skin (cyanosis). °DIAGNOSIS  °Pneumothorax is usually diagnosed by a chest X-ray or chest CT scan. Your health care provider will also take a medical history and perform a physical exam to determine why you may have a pneumothorax. °TREATMENT  °A small pneumothorax may go away on its own without treatment. Extra oxygen can sometimes help a small pneumothorax go away more quickly. For a larger pneumothorax or a pneumothorax that is causing symptoms, a procedure is usually needed to drain the air. In some cases, the health care provider may drain the air using a needle. In other cases, a chest tube may be inserted into the pleural space. A chest tube is a small tube placed between the ribs and into the pleural space. This removes the extra air and allows the lung to expand back to its normal size. A large  pneumothorax will usually require a hospital stay. If there is ongoing air leakage into the pleural space, then the chest tube may need to remain in place for several days until the air leak has healed. In some cases, surgery may be needed.  °HOME CARE INSTRUCTIONS  °· Only take over-the-counter or prescription medicines as directed by your health care provider. °· If a cough or pain makes it difficult for you to sleep at night, try sleeping in a semi-upright position in a recliner or by using 2 or 3 pillows. °· Rest and limit activity as directed by your health care provider. °· If you had a chest tube and it was removed, ask your health care provider when it is okay to remove the dressing. Until your health care provider says you can remove the dressing, do not allow it to get wet. °· Do not smoke. Smoking is a risk factor for pneumothorax. °· Do not fly in an airplane or scuba dive until your health care provider says it is okay. °· Follow up with your health care provider as directed. °SEEK IMMEDIATE MEDICAL CARE IF:  °· You have increasing chest pain or shortness of breath. °· You have a cough that is not controlled with suppressants. °· You begin coughing up blood. °· You have pain that is getting worse or is not controlled with medicines. °· You cough up thick, discolored mucus (sputum) that is yellow to green in color. °· You have redness, increasing pain, or discharge at the site where a chest tube had been in place (if   your pneumothorax was treated with a chest tube). °· The site where your chest tube was located opens up. °· You feel air coming out of the site where the chest tube was placed. °· You have a fever or persistent symptoms for more than 2-3 days. °· You have a fever and your symptoms suddenly get worse. °MAKE SURE YOU:  °· Understand these instructions. °· Will watch your condition. °· Will get help right away if you are not doing well or get worse. °  °This information is not intended to  replace advice given to you by your health care provider. Make sure you discuss any questions you have with your health care provider. °  °Document Released: 10/20/2005 Document Revised: 08/10/2013 Document Reviewed: 05/19/2013 °Elsevier Interactive Patient Education ©2016 [employer] ° °

## 2016-01-27 NOTE — Progress Notes (Addendum)
      [address]       [place] [address]             [phone]       2 Days Post-Op Procedure(s) (LRB): VIDEO BRONCHOSCOPY WITH ENDOBRONCHIAL ULTRASOUND (N/A) VIDEO BRONCHOSCOPY WITH ENDOBRONCHIAL NAVIGATION (N/A)  Subjective: Patient just fell asleep again, but I awakened him. He does have some nausea and Zofran not helping.  Objective: Vital signs in last 24 hours: Temp:  [98.2 F (36.8 C)] 98.2 F (36.8 C) (03/25 1939) Pulse Rate:  [60-63] 63 (03/25 1939) Cardiac Rhythm:  [-] Normal sinus rhythm (03/25 1900) Resp:  [16] 16 (03/25 1939) BP: (111-140)/(45-61) 140/61 mmHg (03/25 1939) SpO2:  [98 %] 98 % (03/25 1939)     Intake/Output from previous day: 03/25 0701 - 03/26 0700 In: 600 [P.O.:600] Out: 1160 [Urine:1150; Chest Tube:10]   Physical Exam:  Cardiovascular: RRR Pulmonary: Clear to auscultation bilaterally; no rales, wheezes, or rhonchi. Abdomen: Soft, non tender, bowel sounds present. Chest Tube: to water seal, NO air leak  Lab Results: CBC:  Recent Labs  01/26/16 0229 01/27/16 0325  WBC 7.7 5.4  HGB 12.5* 12.0*  HCT 35.0* 36.1*  PLT 113* 113*   BMET:   Recent Labs  01/25/16 1219 01/26/16 0229  NA 138 138  K 4.0 4.1  CL 104 102  CO2 25 27  GLUCOSE 116* 199*  BUN 22* 23*  CREATININE 1.16 1.23  CALCIUM 9.1 8.8*    PT/INR:   Recent Labs  01/25/16 1219  LABPROT 14.1  INR 1.07   ABG:  INR: Will add last result for INR, ABG once components are confirmed Will add last 4 CBG results once components are confirmed  Assessment/Plan:  1. CV - SR in the 60's. Will restart low dose Lisinopril. He has NOT been taking Norvasc for awhile so will not restart. 2.  Pulmonary - s/p right chest tube for ptx following EBUS. On room air. Chest tube is to water seal and there is no air leak. CXR stable. Remove chest tube. Check CXR in am. 3. Anemia-H and H stable at 12 and 36.1 4. Mild thrombocytopenia-platelets stable at  113,000 5. Regarding pain control, will stop Ultram as may be contributing to nausea. 6. GI- Has had some nausea. Change to Phenergan as Zofran not helping. Likely related to narcotics 7. If CXR stable and nausea resolved, discharge in am  ZIMMERMAN,DONIELLE MPA-C 01/27/2016,9:03 AM  Chart reviewed, patient examined, agree with above. CXR ok. No air leak so will remove tube.

## 2016-01-27 NOTE — Op Note (Signed)
NAMEMarland Kitchen  Sweeney, SHANK.:  [id number]  MEDICAL RECORD NO.:  [id number]  LOCATION:  2W33C                        FACILITY:  [facility]  PHYSICIAN:  Lanelle Bal, MD    DATE OF BIRTH:  30-Dec-1940  DATE OF PROCEDURE:  01/25/2016 DATE OF DISCHARGE:                              OPERATIVE REPORT   PREOPERATIVE DIAGNOSES:  Right middle lobe lung mass and mediastinal adenopathy, hypermetabolic and enlarged right adrenal gland, hypermetabolic.  POSTOPERATIVE DIAGNOSES:  Right middle lobe lung mass and mediastinal adenopathy, hypermetabolic and enlarged right adrenal gland, hypermetabolic.  PROCEDURE:  Video bronchoscopy with navigation bronchoscopy to the right middle lobe lesion and EBUS with transbronchial biopsies of #7 and #4 R lymph nodes.  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  The patient then presented to Thoracic Surgery 8 months later with a followup CT scan of the chest and the PET scan.  The lesion in the right middle lobe had increased somewhat in size to just over 10 mm.  There was also evidence of mild mediastinal adenopathy, which was hypermetabolic and suggested by Radiology to be sarcoid. Also, the patient had enlarged right adrenal mass that was hypermetabolic, interpreted on the PET scan as being benign because of its unchanged size over 6-8 months.  The patient was evaluated in the Multidisciplinary Thoracic Oncology Conference and it was unclear the diagnosis, so we proceeded with recommend to the patient navigation bronchoscopy and EBUS to attempt to obtain a tissue diagnosis from the lesions in the chest.  Following this, further discussion and findings, considered needle biopsy of the adrenal mass prior to consideration  of resection of the right middle lobe lesion.  The risks and options were discussed with the patient in detail and agreed.  DESCRIPTION OF PROCEDURE:  The patient underwent general endotracheal anesthesia without incident.  He remained hemodynamically stable.  After appropriate time-out, a video bronchoscope was passed through the endotracheal tube in examining both the right and left tracheobronchial tree.  There were no endobronchial lesions to the subsegmental level noted.  A previously planned navigation bronchoscopy plan was loaded into the Super-D navigation equipment.  Appropriate sensors were placed. Calibration was obtained and registration, we then followed the previous created plan to the right middle lobe lesion.  Position was also confirmed with fluoroscopy.  We then proceeded with the needle brush biopsy several passes and also a triple brush.  After several passes, the sensing probe was placed back to confirm position and minor adjustments made.  We also then proceeded with a small biopsy forceps and obtained further tissue.  The initial cytology did not reveal any malignant cells.  We then removed the navigation equipment and proceeded with the EBUS scope locating 4R nodes and several passes in this area with the aspirating needle were done.  The scope was then repositioned and subcarinal nodes labeled #7.  Multiple passes were made into an easily identifiable node.  Quick smear revealed no malignancy, but further biopsies were sent for permanent and for cell block.  The patient was then awakened in the operating room, extubated and transferred to the recovery room stable.  However, in the recovery room, we obtained a chest x-ray in followup and the patient was noted to have a large right pneumothorax dictated under separate note. A right chest tube was placed and the patient was admitted to the hospital.     Lanelle Bal, MD     EG/MEDQ  D:  01/27/2016  T:   01/27/2016  Job:  [id number]

## 2016-01-28 ENCOUNTER — Inpatient Hospital Stay (HOSPITAL_COMMUNITY): Payer: Medicare Other

## 2016-01-28 ENCOUNTER — Encounter (HOSPITAL_COMMUNITY): Payer: Self-pay | Admitting: Cardiothoracic Surgery

## 2016-01-28 MED ORDER — ACETAMINOPHEN 325 MG PO TABS
650.0000 mg | ORAL_TABLET | Freq: Four times a day (QID) | ORAL | Status: DC | PRN
Start: 2016-01-28 — End: 2016-12-24

## 2016-01-28 MED ORDER — TRAMADOL HCL 50 MG PO TABS: 50.0000 mg | ORAL_TABLET | Freq: Four times a day (QID) | ORAL | Status: DC | PRN

## 2016-01-28 MED ORDER — ASPIRIN 81 MG PO TBEC
81.0000 mg | DELAYED_RELEASE_TABLET | Freq: Every day | ORAL | Status: DC
Start: 2016-01-28 — End: 2018-01-19

## 2016-01-28 NOTE — Progress Notes (Signed)
Patient complaining of stomach pain, has been trying to have a bowel movement. Gave patient two containers of prune juice. Will continue to monitor. Call light within reach.

## 2016-01-28 NOTE — Care Management Important Message (Signed)
Important Message  Patient Details  Name: James Sweeney MRN: [id number] Date of Birth: [birth year]   Medicare Important Message Given:  Yes    Loann Quill 01/28/2016, 1:12 PM

## 2016-01-28 NOTE — Progress Notes (Signed)
      [address]       [place] [address]             [phone]      3 Days Post-Op Procedure(s) (LRB): VIDEO BRONCHOSCOPY WITH ENDOBRONCHIAL ULTRASOUND (N/A) VIDEO BRONCHOSCOPY WITH ENDOBRONCHIAL NAVIGATION (N/A)   Subjective:  No new complaints.  Moved bowels, ready to go home.  Objective: Vital signs in last 24 hours: Temp:  [98 F (36.7 C)-98.7 F (37.1 C)] 98.7 F (37.1 C) (03/27 0347) Pulse Rate:  [62-79] 79 (03/27 0347) Cardiac Rhythm:  [-]  Resp:  [14-18] 14 (03/27 0347) BP: (132-155)/(42-58) 155/58 mmHg (03/27 0347) SpO2:  [97 %-99 %] 97 % (03/27 0347)  Intake/Output from previous day: 03/26 0701 - 03/27 0700 In: 1080 [P.O.:1080] Out: 1975 [Urine:1975]  General appearance: alert, cooperative and no distress Heart: regular rate and rhythm Lungs: clear to auscultation bilaterally Abdomen: soft, non-tender; bowel sounds normal; no masses,  no organomegaly Wound: clean and dry  Lab Results:  Recent Labs  01/26/16 0229 01/27/16 0325  WBC 7.7 5.4  HGB 12.5* 12.0*  HCT 35.0* 36.1*  PLT 113* 113*   BMET:  Recent Labs  01/25/16 1219 01/26/16 0229  NA 138 138  K 4.0 4.1  CL 104 102  CO2 25 27  GLUCOSE 116* 199*  BUN 22* 23*  CREATININE 1.16 1.23  CALCIUM 9.1 8.8*    PT/INR:  Recent Labs  01/25/16 1219  LABPROT 14.1  INR 1.07   ABG No results found for: PHART, HCO3, TCO2, ACIDBASEDEF, O2SAT CBG (last 3)   Recent Labs  01/25/16 1141 01/25/16 1403 01/25/16 1742  GLUCAP 100* 112* 163*    Assessment/Plan: S/P Procedure(s) (LRB): VIDEO BRONCHOSCOPY WITH ENDOBRONCHIAL ULTRASOUND (N/A) VIDEO BRONCHOSCOPY WITH ENDOBRONCHIAL NAVIGATION (N/A)  1. Chest tube removed yesterday- follow up CXR this morning is free from pneumthorax 2. Pathology from [facility] remains pending 3. GI- constipation resolved 4. Dispo- patient stable will d/c home today  LOS: 3 days    Ahmed Prima, Junie Panning 01/28/2016

## 2016-01-30 ENCOUNTER — Other Ambulatory Visit: Payer: Self-pay | Admitting: *Deleted

## 2016-01-30 DIAGNOSIS — R911 Solitary pulmonary nodule: Secondary | ICD-10-CM

## 2016-02-08 ENCOUNTER — Encounter: Payer: Self-pay | Admitting: *Deleted

## 2016-02-08 ENCOUNTER — Ambulatory Visit (INDEPENDENT_AMBULATORY_CARE_PROVIDER_SITE_OTHER): Payer: Self-pay

## 2016-02-08 DIAGNOSIS — R918 Other nonspecific abnormal finding of lung field: Secondary | ICD-10-CM

## 2016-02-08 DIAGNOSIS — Z4802 Encounter for removal of sutures: Secondary | ICD-10-CM

## 2016-02-08 NOTE — Progress Notes (Signed)
Removed 2 sutures from chest tube sites with no signs of infection and patient tolerated well. 

## 2016-02-14 ENCOUNTER — Telehealth: Payer: Self-pay | Admitting: *Deleted

## 2016-02-14 NOTE — Telephone Encounter (Signed)
Placed a call to patient to see if patient is ready to schedule biopsy, no answer/ left vm//cm

## 2016-02-26 ENCOUNTER — Other Ambulatory Visit: Payer: Self-pay | Admitting: *Deleted

## 2016-02-26 DIAGNOSIS — J95811 Postprocedural pneumothorax: Secondary | ICD-10-CM

## 2016-02-27 ENCOUNTER — Ambulatory Visit
Admission: RE | Admit: 2016-02-27 | Discharge: 2016-02-27 | Disposition: A | Discharge status: Home / Self Care | Payer: Medicare Other | Source: Ambulatory Visit | Attending: Surgery | Admitting: Surgery

## 2016-02-27 ENCOUNTER — Ambulatory Visit (INDEPENDENT_AMBULATORY_CARE_PROVIDER_SITE_OTHER): Payer: Medicare Other | Admitting: Physician Assistant

## 2016-02-27 ENCOUNTER — Encounter: Payer: Self-pay | Admitting: Physician Assistant

## 2016-02-27 VITALS — BP 130/55 | HR 65 | Resp 20 | Ht 70.0 in | Wt 194.0 lb

## 2016-02-27 DIAGNOSIS — Z9889 Other specified postprocedural states: Secondary | ICD-10-CM | POA: Diagnosis not present

## 2016-02-27 DIAGNOSIS — J95811 Postprocedural pneumothorax: Secondary | ICD-10-CM

## 2016-02-27 DIAGNOSIS — D381 Neoplasm of uncertain behavior of trachea, bronchus and lung: Secondary | ICD-10-CM | POA: Diagnosis not present

## 2016-02-27 NOTE — Progress Notes (Signed)
  HPI: Patient returns for routine postoperative follow-up having undergone a video bronchoscopy with navigation bronchoscopy of the RML lesion and EBUS with transbronchial biopsies of the lymph nodes on 01/25/2016 by Dr. Servando Snare. He then required a right chest tube for right pneumothorax later on this date. Since hospital discharge the patient reports he has had intermittent pain RUQ area. He denies fever, chills, nausea or vomiting, or hematuria.  Current Outpatient Prescriptions  Medication Sig Dispense Refill  . acetaminophen (TYLENOL) 325 MG tablet Take 2 tablets (650 mg total) by mouth every 6 (six) hours as needed for mild pain or fever.    [name] alfuzosin (UROXATRAL) 10 MG 24 hr tablet Take 10 mg by mouth daily.    [name] amLODipine (NORVASC) 10 MG tablet Take 10 mg by mouth daily.    . Ascorbic Acid (VITAMIN C) 100 MG tablet Take 1,000 mg by mouth daily.     [name] aspirin EC 81 MG EC tablet Take 1 tablet (81 mg total) by mouth daily.    . Cyanocobalamin (VITAMIN B-12) 5000 MCG SUBL Place 1 tablet under the tongue daily.    . finasteride (PROSCAR) 5 MG tablet Take 5 mg by mouth daily.    [name] lisinopril-hydrochlorothiazide (PRINZIDE,ZESTORETIC) 20-25 MG per tablet Take 1 tablet by mouth daily.  1  . Omega-3 Fatty Acids (FISH OIL) 1000 MG CAPS Take 1,000 mg by mouth daily.    [name] Propylene Glycol (SYSTANE BALANCE) 0.6 % SOLN Place 1 drop into both eyes every other day. Every other night before bed    . simvastatin (ZOCOR) 20 MG tablet Take 20 mg by mouth every evening.    . traMADol (ULTRAM) 50 MG tablet Take 1 tablet (50 mg total) by mouth every 6 (six) hours as needed. 30 tablet 0  . vitamin E 400 UNIT capsule Take 400 Units by mouth daily.    Vital Signs: BP  130/55, HR 65, RR 20, and Oxygen saturation 98% on room air.   Physical Exam: CV-RRR Pulmonary-Clear to auscultation bilaterally Abdomen-Soft, non tender, protuberant. He did not have pain to light or deep palpation in any quadrant. Bowel  sounds present. Wound-Clean and dry. No sign of infection. No tenderness when I pressed on chest tube wound.  Diagnostic Tests: PA/LAT CXR taken today showed no pneumothorax, pleural effusion, or pulmonary edema.  Impression and Plan: I asked him if he was having pain at his previous chest tube site and he said no. He stated when he has the pain, it is more in the right upper quadrant area. There are no associated symptoms and the pain does not radiate. He states he did have a kidney stone in the past. He denies hematuria but is on proscar for enlarged prostate. I discussed with him that this pain is not from the chest tube;however, if he has pain at the chest tube site, he can take Ultram or NSAID as instructed. He should see his medical doctor for further work up. He will return to see Dr. Servando Snare on 05/04.    Nani Skillern, PA-C [facility] [phone]

## 2016-02-28 ENCOUNTER — Other Ambulatory Visit: Payer: Self-pay | Admitting: Family Medicine

## 2016-02-28 DIAGNOSIS — R1031 Right lower quadrant pain: Secondary | ICD-10-CM

## 2016-02-29 ENCOUNTER — Ambulatory Visit
Admission: RE | Admit: 2016-02-29 | Discharge: 2016-02-29 | Disposition: A | Discharge status: Home / Self Care | Payer: Medicare Other | Source: Ambulatory Visit | Attending: Family Medicine | Admitting: Family Medicine

## 2016-02-29 DIAGNOSIS — R1031 Right lower quadrant pain: Secondary | ICD-10-CM

## 2016-02-29 MED ORDER — IOPAMIDOL (ISOVUE-300) INJECTION 61%
125.0000 mL | Freq: Once | INTRAVENOUS | Status: AC | PRN
  Administered 2016-02-29: 125 mL via INTRAVENOUS

## 2016-03-04 ENCOUNTER — Telehealth: Payer: Self-pay | Admitting: *Deleted

## 2016-03-05 ENCOUNTER — Other Ambulatory Visit: Payer: Self-pay | Admitting: Cardiothoracic Surgery

## 2016-03-05 DIAGNOSIS — J939 Pneumothorax, unspecified: Secondary | ICD-10-CM

## 2016-03-06 ENCOUNTER — Encounter: Payer: Self-pay | Admitting: Cardiothoracic Surgery

## 2016-03-06 ENCOUNTER — Ambulatory Visit (INDEPENDENT_AMBULATORY_CARE_PROVIDER_SITE_OTHER): Payer: Medicare Other | Admitting: Cardiothoracic Surgery

## 2016-03-06 VITALS — BP 150/68 | HR 66 | Resp 20 | Ht 70.0 in | Wt 194.0 lb

## 2016-03-06 DIAGNOSIS — Z9889 Other specified postprocedural states: Secondary | ICD-10-CM | POA: Diagnosis not present

## 2016-03-06 DIAGNOSIS — D381 Neoplasm of uncertain behavior of trachea, bronchus and lung: Secondary | ICD-10-CM

## 2016-03-06 NOTE — Progress Notes (Signed)
[address]       [place] [address]             [id number]                    James Sweeney [facility] [id number] Date of Birth: 01-26-1941  Referring: James Contras, MD Primary Care: James Kroner, MD  Chief Complaint:    01/25/2016 OPERATIVE REPORT PREOPERATIVE DIAGNOSES: Right middle lobe lung mass and mediastinal adenopathy, hypermetabolic and enlarged right adrenal gland, hypermetabolic. POSTOPERATIVE DIAGNOSES: Right middle lobe lung mass and mediastinal adenopathy, hypermetabolic and enlarged right adrenal gland, hypermetabolic. PROCEDURE: Video bronchoscopy with navigation bronchoscopy to the right middle lobe lesion and EBUS with transbronchial biopsies of #7 and #4 R lymph nodes. SURGEON: James Bal, MD PATH: Diagnosis Lung, biopsy, right middle lobe - BENIGN LUNG TISSUE. - SEE COMMENT. Microscopic Comment Sections demonstrate degenerated benign lung tissue with scattered predominantly chronic inflammation. As sampled, there is a no atypia and mass lesional tissue is not identified. The findings are nonspecific. Please correlation with clinical and radiologic impression. (James Sweeney:gt, 01/29/16) James Niece MD Pathologist, Electronic Signature (Case signed 01/29/2016)   History of Present Illness:    James Sweeney 75 y.o. male is seen in the office  today for enlarging right lung nodule , present on CT of abdomen in June ,2016. Patient quit smoking 40-45 years ago. Repeat ct of chest showed right lung nodule grew 64mm. PET ordered . 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 In late March attempt at navigation bronchoscopy ebus was performed, unforeseen no definitive pathologic diagnosis was made. The patient did have complication with right pneumothorax requiring chest tube placement.    He returns to the office today to discuss what further treatment or investigation is needed.  Current  Activity/ Functional Status:  Patient is independent with mobility/ambulation, transfers, ADL's, IADL's.   Zubrod Score: At the time of surgery this patient's most appropriate activity status/level should be described as: []     0    Normal activity, no symptoms [x]     1    Restricted in physical strenuous activity but ambulatory, able to do out light work []     2    Ambulatory and capable of self care, unable to do work activities, up and about               >50 % of waking hours                              []     3    Only limited self care, in bed greater than 50% of waking hours []     4    Completely disabled, no self care, confined to bed or chair []     5    Moribund   Past Medical History  Diagnosis Date  . Hypertension   . Hyperlipemia   . Enlarged prostate   . Cervical spondylosis without myelopathy 12/01/2013  . Lumbago 12/01/2013  . Hearing deficit   . Diabetes mellitus     borderline  . Heart murmur   . Shortness of breath dyspnea     with exertion  . History of kidney stones     removed with Lithotripsy  . Polio     right leg smaller than left  . HOH (hard of hearing)  right ear  . Pneumonia 2016    Past Surgical History  Procedure Laterality Date  . Cataract extraction Bilateral 2014  . Video bronchoscopy with endobronchial ultrasound N/A 01/25/2016    Procedure: VIDEO BRONCHOSCOPY WITH ENDOBRONCHIAL ULTRASOUND;  Surgeon: James Isaac, MD;  Location: [facility];  Service: Thoracic;  Laterality: N/A;  . Video bronchoscopy with endobronchial navigation N/A 01/25/2016    Procedure: VIDEO BRONCHOSCOPY WITH ENDOBRONCHIAL NAVIGATION;  Surgeon: James Isaac, MD;  Location: [facility];  Service: Thoracic;  Laterality: N/A;    Family History  Problem Relation Age of Onset  . Cancer Mother     Stomach  . Cancer - Lung Father   . Heart attack Brother   . Diabetes Brother     Social History   Social History  . Marital Status: Married    Spouse Name: N/A  .  Number of Children: 2  . Years of Education: N/A   Occupational History  . Not on file.   Social History Main Topics  . Smoking status: Former Smoker -- 0.50 packs/day for 9 years    Types: Cigarettes    Quit date: 12/02/1967  . Smokeless tobacco: Current User    Types: Chew  . Alcohol Use: No  . Drug Use: No  . Sexual Activity: No   Other Topics Concern  . Not on file   Social History Narrative   Patient is right handed.   Patient drinks 1 cup caffeine daily.      Pt has been married for 47 years and has 2 children, 4 grandchildren, and 3 great-grandchildren. Lives with everyone except great-grandchildren. He does not clean house, but can shop, do yard work, and drive. 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         Epworth Sleepiness Scale Score:  8      --I have HTN   --I seem to be losing my sex drive   --I wake up to urinate frequently at night   --I awake feeling not rested   --I have borderline diabetes    History  Smoking status  . Former Smoker -- 0.50 packs/day for 9 years  . Types: Cigarettes  . Quit date: 12/02/1967  Smokeless tobacco  . Current User  . Types: Chew    History  Alcohol Use No     Allergies  Allergen Reactions  . Levaquin [Levofloxacin In D5w] Nausea Only  . Penicillins Hives    Has patient had a PCN reaction causing immediate rash, facial/tongue/throat swelling, SOB or lightheadedness with hypotension: unknown, childhood allergy Has patient had a PCN reaction causing severe rash involving mucus membranes or skin necrosis: No Has patient had a PCN reaction that required hospitalization No Has patient had a PCN reaction occurring within the last 10 years: No If all of the above answers are "NO", then may proceed with Cephalosporin use.     Current Outpatient Prescriptions  Medication Sig Dispense Refill  . acetaminophen (TYLENOL) 325 MG tablet Take 2 tablets (650 mg total) by mouth every 6 (six) hours as  needed for mild pain or fever.    [name] alfuzosin (UROXATRAL) 10 MG 24 hr tablet Take 10 mg by mouth daily.    [name] amLODipine (NORVASC) 10 MG tablet Take 10 mg by mouth daily.    . Ascorbic Acid (VITAMIN C) 100 MG tablet Take 1,000 mg by mouth daily.     [name] aspirin EC 81 MG EC tablet Take 1  tablet (81 mg total) by mouth daily.    . Cyanocobalamin (VITAMIN B-12) 5000 MCG SUBL Place 1 tablet under the tongue daily.    . finasteride (PROSCAR) 5 MG tablet Take 5 mg by mouth daily.    [name] lisinopril-hydrochlorothiazide (PRINZIDE,ZESTORETIC) 20-25 MG per tablet Take 1 tablet by mouth daily.  1  . Omega-3 Fatty Acids (FISH OIL) 1000 MG CAPS Take 1,000 mg by mouth daily.    [name] Propylene Glycol (SYSTANE BALANCE) 0.6 % SOLN Place 1 drop into both eyes every other day. Every other night before bed    . simvastatin (ZOCOR) 20 MG tablet Take 20 mg by mouth every evening.    . traMADol (ULTRAM) 50 MG tablet Take 1 tablet (50 mg total) by mouth every 6 (six) hours as needed. 30 tablet 0  . vitamin E 400 UNIT capsule Take 400 Units by mouth daily.     No current facility-administered medications for this visit.      Review of Systems:     Cardiac Review of Systems: Y or N  Chest Pain [   n ]  Resting SOB [n   ] Exertional SOB  [ y ]  Orthopnea [ n ]   Pedal Edema [ n  ]    Palpitations [ n ] Syncope  [n]   Presyncope [n   ]  General Review of Systems: [Y] = yes [  ]=no Constitional: recent weight change [  ];  Wt loss over the last 3 months [   ] anorexia [  ]; fatigue [  ]; nausea [  ]; night sweats [  ]; fever [  ]; or chills [  ];          Dental: poor dentition[  ]; Last Dentist visit:   Eye : blurred vision [  ]; diplopia [   ]; vision changes [  ];  Amaurosis fugax[  ]; Resp: cough [  ];  wheezing[ n ];  hemoptysis[n  ]; shortness of breath[  ]; paroxysmal nocturnal dyspnea[  ]; dyspnea on exertion[ y ]; or orthopnea[  ];  GI:  gallstones[  ], vomiting[  ];  dysphagia[  ]; melena[  ];  hematochezia [  ];  heartburn[  ];   Hx of  Colonoscopy[  ]; GU: kidney stones [  ]; hematuria[  ];   dysuria [  ];  nocturia[  ];  history of     obstruction [  ]; urinary frequency [  ]             Skin: rash, swelling[  ];, hair loss[  ];  peripheral edema[  ];  or itching[  ]; Musculosketetal: myalgias[  ];  joint swelling[  ];  joint erythema[  ];  joint pain[  ];  back pain[  ];  Heme/Lymph: bruising[  ];  bleeding[  ];  anemia[  ];  Neuro: TIA[ n ];  headaches[  ];  stroke[  ];  vertigo[  ];  seizures[  ];   paresthesias[  ];  difficulty walking[  ];  Psych:depression[  ]; anxiety[  ];  Endocrine: diabetes[  ];  thyroid dysfunction[  ];  Immunizations: Flu up to date [  ]; Pneumococcal up to date [  ];  Other:  Physical Exam: There were no vitals taken for this visit.  PHYSICAL EXAMINATION: General appearance: alert, cooperative and appears older than stated age Head: Normocephalic, without obvious abnormality, atraumatic Neck: no adenopathy, no carotid  bruit, no JVD, supple, symmetrical, trachea midline and thyroid not enlarged, symmetric, no tenderness/mass/nodules Lymph nodes: Cervical, supraclavicular, and axillary nodes normal. Resp: clear to auscultation bilaterally Back: symmetric, no curvature. ROM normal. No CVA tenderness. Cardio: regular rate and rhythm, S1, S2 normal, no murmur, click, rub or gallop GI: soft, non-tender; bowel sounds normal; no masses,  no organomegaly Extremities: extremities normal, atraumatic, no cyanosis or edema and Homans sign is negative, no sign of DVT Neurologic: Grossly normal  Diagnostic Studies & Laboratory data:     Recent Radiology Findings:   Nm Pet Image Initial (pi) Skull Base To Thigh  01/01/2016  CLINICAL DATA:  Initial treatment strategy for pulmonary nodule. EXAM: NUCLEAR MEDICINE PET SKULL BASE TO THIGH TECHNIQUE: 9.83 mCi F-18 FDG was injected intravenously. Full-ring PET imaging was performed from the skull base to thigh after the radiotracer. CT  data was obtained and used for attenuation correction and anatomic localization. FASTING BLOOD GLUCOSE:  Value: 118 mg/dl COMPARISON:  CT chest dated 12/12/2015. CT abdomen pelvis dated 04/16/2015. FINDINGS: NECK No hypermetabolic lymph nodes in the neck. CHEST 12 x 10 mm right middle lobe nodule (series 6/ image 49), max SUV 2.7, suspicious for primary bronchogenic neoplasm. Mild subpleural nodularity in the posterior right upper lobe (series 6/image 19). No focal consolidation. No pleural effusion or pneumothorax. The heart is normal in size. Coronary atherosclerosis in the LAD and right coronary artery. Atherosclerotic calcifications aortic arch. Thoracic lymphadenopathy, including: --10 mm short axis AP window node (series 4/image 66), max SUV 5.7 --11 mm short axis partially calcified prevascular node (series 4/image 68) --12 mm short axis right hilar node (series 4/image 70), max SUV 6.5 --13 mm short axis partially calcified subcarinal node (series 4/image 32), max SUV 6.9 --11 mm short axis left hilar node (series 4/image 32), max SUV 6.6 Visualized right thyroid is notable for an 18 x 10 mm right thyroid nodule (series 4/image 45). ABDOMEN/PELVIS No abnormal hypermetabolic activity within the liver, pancreas, or spleen. 2.1 x 2.4 cm right adrenal nodule, unchanged from prior 2016 CT abdomen pelvis, max SUV 5.9. Left renal sinus cysts. Atherosclerotic calcification of the abdominal aorta and branch vessels. Prostatomegaly, with enlargement of the central gland which indents the base the bladder. Bladder is mildly thick-walled although underdistended. Tiny fat containing bilateral inguinal hernias. No hypermetabolic lymph nodes in the abdomen or pelvis. SKELETON No focal hypermetabolic activity to suggest skeletal metastasis. IMPRESSION: 12 x 10 mm hypermetabolic right middle lobe nodule, suspicious for primary bronchogenic neoplasm. Hypermetabolic thoracic lymphadenopathy, most of which is partially  calcified, favored to reflect granulomatous disease such as sarcoidosis. Nodal metastases is considered less likely. 2.1 x 2.4 cm hypermetabolic right adrenal nodule, unchanged from June 2016. While technically indeterminate, metastasis not excluded, stability favors a benign etiology such as an adrenal adenoma. Electronically Signed   By: Julian Hy M.D.   On: 01/01/2016 10:47  I have independently reviewed the above radiology studies  and reviewed the findings with the patient.  Recent Lab Findings: Lab Results  Component Value Date   WBC 5.4 01/27/2016   HGB 12.0* 01/27/2016   HCT 36.1* 01/27/2016   PLT 113* 01/27/2016   GLUCOSE 199* 01/26/2016   ALT 23 01/25/2016   AST 18 01/25/2016   NA 138 01/26/2016   K 4.1 01/26/2016   CL 102 01/26/2016   CREATININE 1.23 01/26/2016   BUN 23* 01/26/2016   CO2 27 01/26/2016   INR 1.07 01/25/2016   Chronic Kidney Disease  Stage I     GFR >90  Stage II    GFR 60-89  Stage IIIA GFR 45-59  Stage IIIB GFR 30-44  Stage IV   GFR 15-29  Stage V    GFR  <15  Lab Results  Component Value Date   CREATININE 1.23 01/26/2016   CrCl cannot be calculated (Patient has no serum creatinine result on file.).  PFT's: FEV1 3.07 100% Dlco 25  79%  Conclusions: The diffusion defect is consistent with a pulmonary vascular process. Anemia cannot be excluded as a potential cause of the diffusion defect without correcting the observed diffusing capacity for hemoglobin. Pulmonary Function Diagnosis: Minimal Diffusion Defect -Pulmonary Vascular  ECHO: LV EF: 65% - 70%  ------------------------------------------------------------------- History: PMH: Dyspnea. Risk factors: Smokless tabacco user. Former tobacco use. Hypertension. Diabetes mellitus. Dyslipidemia.  ------------------------------------------------------------------- Study Conclusions  - Left ventricle: The cavity size was normal. There was mild  concentric hypertrophy.  Systolic function was vigorous. The  estimated ejection fraction was in the range of 65% to 70%. Wall  motion was normal; there were no regional wall motion  abnormalities. Doppler parameters are consistent with abnormal  left ventricular relaxation (grade 1 diastolic dysfunction).  There was no evidence of elevated ventricular filling pressure by  Doppler parameters. - Aortic valve: Trileaflet; normal thickness leaflets. Valve area  (Vmax): 1.58 cm^2. - Aortic root: The aortic root was normal in size. - Left atrium: The atrium was mildly dilated. - Right ventricle: Systolic function was normal. - Right atrium: The atrium was normal in size. - Tricuspid valve: There was trivial regurgitation. - Pulmonary arteries: Systolic pressure was within the normal  range. - Inferior vena cava: The vessel was normal in size. - Pericardium, extracardiac: There was no pericardial effusion.  Transthoracic echocardiography. M-mode, complete 2D, spectral Doppler, and color Doppler. Birthdate: Patient birthdate: [birth year]. Age: Patient is 75 yr old. Sex: Gender: male. BMI: 27.8 kg/m^2. Blood pressure: 126/50 Patient status: Outpatient. Study date: Study date: 01/23/2016. Study time: 10:07 AM.  -------------------------------------------------------------------  ------------------------------------------------------------------- Left ventricle: The cavity size was normal. There was mild concentric hypertrophy. Systolic function was vigorous. The estimated ejection fraction was in the range of 65% to 70%. Wall motion was normal; there were no regional wall motion abnormalities. The transmitral flow pattern was normal. The deceleration time of the early transmitral flow velocity was normal. The pulmonary vein flow pattern was normal. The tissue Doppler parameters were normal. Doppler parameters are consistent with abnormal left ventricular relaxation (grade 1  diastolic dysfunction). There was no evidence of elevated ventricular filling pressure by Doppler parameters.  ------------------------------------------------------------------- Aortic valve: Trileaflet; normal thickness leaflets. Mobility was not restricted. Sclerosis without stenosis. Doppler: Transvalvular velocity was within the normal range. There was no stenosis. There was no regurgitation. Peak velocity ratio of LVOT to aortic valve: 0.62. Valve area (Vmax): 1.58 cm^2. Indexed valve area (Vmax): 0.75 cm^2/m^2. Peak gradient (S): 9 mm Hg.  ------------------------------------------------------------------- Aorta: Aortic root: The aortic root was normal in size.  ------------------------------------------------------------------- Mitral valve: Structurally normal valve. Mobility was not restricted. Doppler: Transvalvular velocity was within the normal range. There was no evidence for stenosis. There was no regurgitation. Peak gradient (D): 2 mm Hg.  ------------------------------------------------------------------- Left atrium: The atrium was mildly dilated.  ------------------------------------------------------------------- Right ventricle: The cavity size was normal. Wall thickness was normal. Systolic function was normal.  ------------------------------------------------------------------- Pulmonic valve: Doppler: Transvalvular velocity was within the normal range. There was no evidence for stenosis.  ------------------------------------------------------------------- Tricuspid valve: Structurally normal valve. Doppler: Transvalvular  velocity was within the normal range. There was trivial regurgitation.  ------------------------------------------------------------------- Pulmonary artery: The main pulmonary artery was normal-sized. Systolic pressure was within the normal  range.  ------------------------------------------------------------------- Right atrium: The atrium was normal in size.  ------------------------------------------------------------------- Pericardium: There was no pericardial effusion.  ------------------------------------------------------------------- Systemic veins: Inferior vena cava: The vessel was normal in size.  ------------------------------------------------------------------- Measurements  Left ventricle Value Reference LV ID, ED, PLAX chordal 44.7 mm 43 - 52 LV ID, ES, PLAX chordal 28.9 mm 23 - 38 LV fx shortening, PLAX chordal 35 % >=29 LV PW thickness, ED 12.9 mm --------- IVS/LV PW ratio, ED 1.02 <=1.3 Stroke volume, 2D 64 ml --------- Stroke volume/bsa, 2D 30 ml/m^2 --------- LV ejection fraction, 1-p A4C 57 % --------- LV end-diastolic volume, 2-p [id number] ml --------- LV end-systolic volume, 2-p 52 ml --------- LV ejection fraction, 2-p 60 % --------- Stroke volume, 2-p 78 ml --------- LV end-diastolic volume/bsa, 2-p 62 ml/m^2 --------- LV end-systolic volume/bsa, 2-p 25 ml/m^2 --------- Stroke volume/bsa, 2-p 37.1 ml/m^2 --------- LV e&', lateral 8.09 cm/s --------- LV E/e&', lateral 9.38 --------- LV s&', lateral 16 cm/s --------- LV e&', medial 8.29 cm/s --------- LV E/e&', medial 9.16 --------- LV e&',  average 8.19 cm/s --------- LV E/e&', average 9.27 ---------  Ventricular septum Value Reference IVS thickness, ED 13.1 mm ---------  LVOT Value Reference LVOT ID, S 18 mm --------- LVOT area 2.54 cm^2 --------- LVOT peak velocity, S 93.1 cm/s --------- LVOT mean velocity, S 67 cm/s --------- LVOT VTI, S 25.2 cm ---------  Aortic valve Value Reference Aortic valve peak velocity, S 150 cm/s --------- Aortic peak gradient, S 9 mm Hg --------- Velocity ratio, peak, LVOT/AV 0.62 --------- Aortic valve area, peak velocity 1.58 cm^2 --------- Aortic valve area/bsa, peak 0.75 cm^2/m^2 --------- velocity  Aorta Value Reference Aortic root ID, ED 31 mm ---------  Left atrium Value Reference LA ID, A-P, ES 42 mm --------- LA ID/bsa, A-P 2 cm/m^2 <=2.2 LA volume, S 44 ml --------- LA volume/bsa, S 20.9 ml/m^2 --------- LA volume, ES, 1-p A4C 44 ml --------- LA volume/bsa, ES, 1-p A4C 20.9 ml/m^2 --------- LA volume, ES, 1-p A2C 42 ml --------- LA volume/bsa, ES, 1-p A2C 20 ml/m^2 ---------  Mitral valve Value  Reference Mitral E-wave peak velocity 75.9 cm/s --------- Mitral A-wave peak velocity 103 cm/s --------- Mitral deceleration time (H) 306 ms 150 - 230 Mitral peak gradient, D 2 mm Hg --------- Mitral E/A ratio, peak 0.8 --------- Mitral maximal regurg velocity, 341 cm/s --------- PISA  Pulmonary arteries Value Reference PA pressure, S, DP 20 mm Hg <=30  Tricuspid valve Value Reference Tricuspid regurg peak velocity 204 cm/s --------- Tricuspid peak RV-RA gradient 17 mm Hg ---------  Right ventricle Value Reference RV ID, ED, PLAX 30.7 mm 19 - 38  Pulmonic valve Value Reference Pulmonic valve peak velocity, S 80.2 cm/s ---------  Legend: (L) and (H) mark values outside specified reference range.  ------------------------------------------------------------------- Prepared and Electronically Authenticated by  Ena Dawley, M.D. 2017-03-22T12:44:23  Notes Recorded by Sanda Klein, MD on 01/23/2016 at 2:20 PM Both the stress test and the cho are low risk. The aortic valve is sclerotic, but not stenotic. The LVEF is normal. OK to proceed with surgical procedure with Dr. Servando Snare.       Assessment / Plan:   Right middle lobe 1 cm lung nodule enlarging with hypermetabolic right adrenal lesion and hypermetabolic bilateral hilar nodes- without definitive tissue diagnosis. I discussed with the patient and his wife proceeding with surgical resection of  the lesion in the right middle lobe. After the episode with the chest tube patient is very hasn't about having any interventional  procedures done, including surgical resection. I discussed this in detail with him and he has agreed agreeable to a follow-up CT scan of the chest approximately 3-4 months after the previous scan, which will be in June 2017. We'll plan a follow-up CT of the chest at that time. I've made it clear to the patient and his wife that in this situation a negative needle biopsy does not guarantee there is no malignancy involved. Recent CT of the abdomen shows no change since June 2016 of the adrenal mass on the right.  James Isaac MD      [address] [place] [address] Office [phone]   Beeper [phone]  03/06/2016 10:18 AM

## 2016-04-03 ENCOUNTER — Other Ambulatory Visit: Payer: Self-pay | Admitting: Cardiothoracic Surgery

## 2016-04-03 DIAGNOSIS — R911 Solitary pulmonary nodule: Secondary | ICD-10-CM

## 2016-04-17 ENCOUNTER — Ambulatory Visit
Admission: RE | Admit: 2016-04-17 | Discharge: 2016-04-17 | Disposition: A | Discharge status: Home / Self Care | Payer: Medicare Other | Source: Ambulatory Visit | Attending: Cardiothoracic Surgery | Admitting: Cardiothoracic Surgery

## 2016-04-17 ENCOUNTER — Ambulatory Visit (INDEPENDENT_AMBULATORY_CARE_PROVIDER_SITE_OTHER): Payer: Medicare Other | Admitting: Cardiothoracic Surgery

## 2016-04-17 ENCOUNTER — Other Ambulatory Visit: Payer: Self-pay | Admitting: *Deleted

## 2016-04-17 ENCOUNTER — Encounter: Payer: Self-pay | Admitting: Cardiothoracic Surgery

## 2016-04-17 VITALS — BP 148/57 | HR 64 | Resp 19 | Ht 70.0 in | Wt 193.0 lb

## 2016-04-17 DIAGNOSIS — R911 Solitary pulmonary nodule: Secondary | ICD-10-CM

## 2016-04-17 NOTE — Progress Notes (Signed)
James Sweeney       [place] [address]             [id number]                    Jonael E Laplante [facility] Medical Record [id number] Date of Birth: 01-26-1941  Referring: Antony Contras, MD Primary Care: Gara Kroner, MD  Chief Complaint:    01/25/2016 OPERATIVE REPORT PREOPERATIVE DIAGNOSES: Right middle lobe lung mass and mediastinal adenopathy, hypermetabolic and enlarged right adrenal gland, hypermetabolic. POSTOPERATIVE DIAGNOSES: Right middle lobe lung mass and mediastinal adenopathy, hypermetabolic and enlarged right adrenal gland, hypermetabolic. PROCEDURE: Video bronchoscopy with navigation bronchoscopy to the right middle lobe lesion and EBUS with transbronchial biopsies of #7 and #4 R lymph nodes. SURGEON: Lanelle Bal, MD PATH: Diagnosis Lung, biopsy, right middle lobe - BENIGN LUNG TISSUE. - SEE COMMENT. Microscopic Comment Sections demonstrate degenerated benign lung tissue with scattered predominantly chronic inflammation. As sampled, there is a no atypia and mass lesional tissue is not identified. The findings are nonspecific. Please correlation with clinical and radiologic impression. (RAH:gt, 01/29/16) [name] Pathologist, Electronic Signature (Case signed 01/29/2016)   History of Present Illness:    James Sweeney 75 y.o. male is seen in the office  today for enlarging right lung nodule , present on CT of abdomen in June ,2016. Patient quit smoking 40-45 years ago. Repeat ct of chest showed right lung nodule grew 64mm. PET ordered . 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 In late March attempt at navigation bronchoscopy ebus was performed, unforeseen no definitive pathologic diagnosis was made. The patient did have complication with right pneumothorax requiring chest tube placement.    He returns to the office today to discuss what further treatment or investigation is needed.  Current  Activity/ Functional Status:  Patient is independent with mobility/ambulation, transfers, ADL's, IADL's.   Zubrod Score: At the time of surgery this patient's most appropriate activity status/level should be described as: []     0    Normal activity, no symptoms [x]     1    Restricted in physical strenuous activity but ambulatory, able to do out light work []     2    Ambulatory and capable of self care, unable to do work activities, up and about               >50 % of waking hours                              []     3    Only limited self care, in bed greater than 50% of waking hours []     4    Completely disabled, no self care, confined to bed or chair []     5    Moribund   Past Medical History  Diagnosis Date  . Hypertension   . Hyperlipemia   . Enlarged prostate   . Cervical spondylosis without myelopathy 12/01/2013  . Lumbago 12/01/2013  . Hearing deficit   . Diabetes mellitus     borderline  . Heart murmur   . Shortness of breath dyspnea     with exertion  . History of kidney stones     removed with Lithotripsy  . Polio     right leg smaller than left  . HOH (hard of hearing)  right ear  . Pneumonia 2016    Past Surgical History  Procedure Laterality Date  . Cataract extraction Bilateral 2014  . Video bronchoscopy with endobronchial ultrasound N/A 01/25/2016    Procedure: VIDEO BRONCHOSCOPY WITH ENDOBRONCHIAL ULTRASOUND;  Surgeon: [name], MD;  Location: [facility];  Service: Thoracic;  Laterality: N/A;  . Video bronchoscopy with endobronchial navigation N/A 01/25/2016    Procedure: VIDEO BRONCHOSCOPY WITH ENDOBRONCHIAL NAVIGATION;  Surgeon: [name], MD;  Location: [facility];  Service: Thoracic;  Laterality: N/A;    Family History  Problem Relation Age of Onset  . Cancer Mother     Stomach  . Cancer - Lung Father   . Heart attack Brother   . Diabetes Brother     Social History   Social History  . Marital Status: Married    Spouse Name: N/A  .  Number of Children: 2  . Years of Education: N/A   Occupational History  . Not on file.   Social History Main Topics  . Smoking status: Former Smoker -- 0.50 packs/day for 9 years    Types: Cigarettes    Quit date: 12/02/1967  . Smokeless tobacco: Current User    Types: Chew  . Alcohol Use: No  . Drug Use: No  . Sexual Activity: No   Other Topics Concern  . Not on file   Social History Narrative   Patient is right handed.   Patient drinks 1 cup caffeine daily.      Pt has been married for 47 years and has 2 children, 4 grandchildren, and 3 great-grandchildren. Lives with everyone except great-grandchildren. He does not clean house, but can shop, do yard work, and drive. 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         Epworth Sleepiness Scale Score:  8      --I have HTN   --I seem to be losing my sex drive   --I wake up to urinate frequently at night   --I awake feeling not rested   --I have borderline diabetes    History  Smoking status  . Former Smoker -- 0.50 packs/day for 9 years  . Types: Cigarettes  . Quit date: 12/02/1967  Smokeless tobacco  . Current User  . Types: Chew    History  Alcohol Use No     Allergies  Allergen Reactions  . Levaquin [Levofloxacin In D5w] Nausea Only  . Penicillins Hives    Has patient had a PCN reaction causing immediate rash, facial/tongue/throat swelling, SOB or lightheadedness with hypotension: unknown, childhood allergy Has patient had a PCN reaction causing severe rash involving mucus membranes or skin necrosis: No Has patient had a PCN reaction that required hospitalization No Has patient had a PCN reaction occurring within the last 10 years: No If all of the above answers are "NO", then may proceed with Cephalosporin use.     Current Outpatient Prescriptions  Medication Sig Dispense Refill  . acetaminophen (TYLENOL) 325 MG tablet Take 2 tablets (650 mg total) by mouth every 6 (six) hours as  needed for mild pain or fever.    [name] alfuzosin (UROXATRAL) 10 MG 24 hr tablet Take 10 mg by mouth daily.    [name] amLODipine (NORVASC) 10 MG tablet Take 10 mg by mouth daily.    . Ascorbic Acid (VITAMIN C) 100 MG tablet Take 1,000 mg by mouth daily.     [name] aspirin EC 81 MG EC tablet Take 1  tablet (81 mg total) by mouth daily.    . Cyanocobalamin (VITAMIN B-12) 5000 MCG SUBL Place 1 tablet under the tongue daily.    . finasteride (PROSCAR) 5 MG tablet Take 5 mg by mouth daily.    [name] lisinopril-hydrochlorothiazide (PRINZIDE,ZESTORETIC) 20-25 MG per tablet Take 1 tablet by mouth daily.  1  . Omega-3 Fatty Acids (FISH OIL) 1000 MG CAPS Take 1,000 mg by mouth daily.    [name] Propylene Glycol (SYSTANE BALANCE) 0.6 % SOLN Place 1 drop into both eyes every other day. Every other night before bed    . simvastatin (ZOCOR) 20 MG tablet Take 20 mg by mouth every evening.    . traMADol (ULTRAM) 50 MG tablet Take 1 tablet (50 mg total) by mouth every 6 (six) hours as needed. 30 tablet 0  . vitamin E 400 UNIT capsule Take 400 Units by mouth daily.     No current facility-administered medications for this visit.      Review of Systems:     Cardiac Review of Systems: Y or N  Chest Pain [   n ]  Resting SOB [n   ] Exertional SOB  [ y ]  Orthopnea [ n ]   Pedal Edema [ n  ]    Palpitations [ n ] Syncope  [n]   Presyncope [n   ]  General Review of Systems: [Y] = yes [  ]=no Constitional: recent weight change [  ];  Wt loss over the last 3 months [   ] anorexia [  ]; fatigue [  ]; nausea [  ]; night sweats [  ]; fever [  ]; or chills [  ];          Dental: poor dentition[  ]; Last Dentist visit:   Eye : blurred vision [  ]; diplopia [   ]; vision changes [  ];  Amaurosis fugax[  ]; Resp: cough [  ];  wheezing[ n ];  hemoptysis[n  ]; shortness of breath[  ]; paroxysmal nocturnal dyspnea[  ]; dyspnea on exertion[ y ]; or orthopnea[  ];  GI:  gallstones[  ], vomiting[  ];  dysphagia[  ]; melena[  ];  hematochezia [  ];  heartburn[  ];   Hx of  Colonoscopy[  ]; GU: kidney stones [  ]; hematuria[  ];   dysuria [  ];  nocturia[  ];  history of     obstruction [  ]; urinary frequency [  ]             Skin: rash, swelling[  ];, hair loss[  ];  peripheral edema[  ];  or itching[  ]; Musculosketetal: myalgias[  ];  joint swelling[  ];  joint erythema[  ];  joint pain[  ];  back pain[  ];  Heme/Lymph: bruising[  ];  bleeding[  ];  anemia[  ];  Neuro: TIA[ n ];  headaches[  ];  stroke[  ];  vertigo[  ];  seizures[  ];   paresthesias[  ];  difficulty walking[  ];  Psych:depression[  ]; anxiety[  ];  Endocrine: diabetes[  ];  thyroid dysfunction[  ];  Immunizations: Flu up to date [  ]; Pneumococcal up to date [  ];  Other:  Physical Exam: BP 148/57 mmHg  Pulse 64  Resp 19  Ht 5\' 10"  (1.778 m)  Wt 193 lb (87.544 kg)  BMI 27.69 kg/m2  SpO2 98%  PHYSICAL EXAMINATION: General  appearance: alert, cooperative and appears older than stated age Head: Normocephalic, without obvious abnormality, atraumatic Neck: no adenopathy, no carotid bruit, no JVD, supple, symmetrical, trachea midline and thyroid not enlarged, symmetric, no tenderness/mass/nodules Lymph nodes: Cervical, supraclavicular, and axillary nodes normal. Resp: clear to auscultation bilaterally Back: symmetric, no curvature. ROM normal. No CVA tenderness. Cardio: regular rate and rhythm, S1, S2 normal, no murmur, click, rub or gallop GI: soft, non-tender; bowel sounds normal; no masses,  no organomegaly Extremities: extremities normal, atraumatic, no cyanosis or edema and Homans sign is negative, no sign of DVT Neurologic: Grossly normal  Diagnostic Studies & Laboratory data:     Recent Radiology Findings:   Ct Chest Wo Contrast  04/17/2016  CLINICAL DATA:  Followup lung nodule EXAM: CT CHEST WITHOUT CONTRAST TECHNIQUE: Multidetector CT imaging of the chest was performed following the standard protocol without IV contrast. COMPARISON:  12/12/2015  FINDINGS: Mediastinum/Lymph Nodes: Calcified mediastinal lymph nodes are again noted and appears similar to previous exam. Aortic atherosclerosis. Calcification within the RCA, LAD coronary artery. The trachea appears patent and is midline. Normal appearance of the esophagus. Lungs/Pleura: No pulmonary mass, infiltrate, or effusion. Pulmonary nodule in the right middle lobe measures 1.6 x 1.1 cm, image 84 of series 4. Previously this measured 1.2 x 1.0 cm peer on PET-CT from 01/01/2016 this exhibited malignant range FDG uptake. Calcified granuloma noted within the posterior right upper lobe. Upper abdomen: No acute findings. Stable appearance of right adrenal nodule measuring 2.5 cm, image 131 of series 3. Similar appearance of liver cysts. No acute findings identified within the upper abdomen. Musculoskeletal: Spondylosis noted within the thoracic spine. No aggressive lytic or sclerotic bone lesions identified. IMPRESSION: 1. Increase in size of right middle lobe lung nodule. This remains suspicious for primary bronchogenic carcinoma. 2. Similar appearance of calcified mediastinal nodes. 3. No change in 2.4 cm right adrenal nodule. 4. Aortic atherosclerosis and multi vessel coronary artery calcification. Electronically Signed   By: Kerby Moors M.D.   On: 04/17/2016 10:12    Nm Pet Image Initial (pi) Skull Base To Thigh  01/01/2016  CLINICAL DATA:  Initial treatment strategy for pulmonary nodule. EXAM: NUCLEAR MEDICINE PET SKULL BASE TO THIGH TECHNIQUE: 9.83 mCi F-18 FDG was injected intravenously. Full-ring PET imaging was performed from the skull base to thigh after the radiotracer. CT data was obtained and used for attenuation correction and anatomic localization. FASTING BLOOD GLUCOSE:  Value: 118 mg/dl COMPARISON:  CT chest dated 12/12/2015. CT abdomen pelvis dated 04/16/2015. FINDINGS: NECK No hypermetabolic lymph nodes in the neck. CHEST 12 x 10 mm right middle lobe nodule (series 6/ image 49), max SUV  2.7, suspicious for primary bronchogenic neoplasm. Mild subpleural nodularity in the posterior right upper lobe (series 6/image 19). No focal consolidation. No pleural effusion or pneumothorax. The heart is normal in size. Coronary atherosclerosis in the LAD and right coronary artery. Atherosclerotic calcifications aortic arch. Thoracic lymphadenopathy, including: --10 mm short axis AP window node (series 4/image 66), max SUV 5.7 --11 mm short axis partially calcified prevascular node (series 4/image 68) --12 mm short axis right hilar node (series 4/image 70), max SUV 6.5 --13 mm short axis partially calcified subcarinal node (series 4/image 32), max SUV 6.9 --11 mm short axis left hilar node (series 4/image 32), max SUV 6.6 Visualized right thyroid is notable for an 18 x 10 mm right thyroid nodule (series 4/image 45). ABDOMEN/PELVIS No abnormal hypermetabolic activity within the liver, pancreas, or spleen. 2.1 x 2.4 cm right  adrenal nodule, unchanged from prior 2016 CT abdomen pelvis, max SUV 5.9. Left renal sinus cysts. Atherosclerotic calcification of the abdominal aorta and branch vessels. Prostatomegaly, with enlargement of the central gland which indents the base the bladder. Bladder is mildly thick-walled although underdistended. Tiny fat containing bilateral inguinal hernias. No hypermetabolic lymph nodes in the abdomen or pelvis. SKELETON No focal hypermetabolic activity to suggest skeletal metastasis. IMPRESSION: 12 x 10 mm hypermetabolic right middle lobe nodule, suspicious for primary bronchogenic neoplasm. Hypermetabolic thoracic lymphadenopathy, most of which is partially calcified, favored to reflect granulomatous disease such as sarcoidosis. Nodal metastases is considered less likely. 2.1 x 2.4 cm hypermetabolic right adrenal nodule, unchanged from June 2016. While technically indeterminate, metastasis not excluded, stability favors a benign etiology such as an adrenal adenoma. Electronically Signed    By: Julian Hy M.D.   On: 01/01/2016 10:47  I have independently reviewed the above radiology studies  and reviewed the findings with the patient.  Recent Lab Findings: Lab Results  Component Value Date   WBC 5.4 01/27/2016   HGB 12.0* 01/27/2016   HCT 36.1* 01/27/2016   PLT 113* 01/27/2016   GLUCOSE 199* 01/26/2016   ALT 23 01/25/2016   AST 18 01/25/2016   NA 138 01/26/2016   K 4.1 01/26/2016   CL 102 01/26/2016   CREATININE 1.23 01/26/2016   BUN 23* 01/26/2016   CO2 27 01/26/2016   INR 1.07 01/25/2016   Chronic Kidney Disease   Stage I     GFR >90  Stage II    GFR 60-89  Stage IIIA GFR 45-59  Stage IIIB GFR 30-44  Stage IV   GFR 15-29  Stage V    GFR  <15  Lab Results  Component Value Date   CREATININE 1.23 01/26/2016   CrCl cannot be calculated (Patient has no serum creatinine result on file.).  PFT's: FEV1 3.07 100% Dlco 25  79%  Conclusions: The diffusion defect is consistent with a pulmonary vascular process. Anemia cannot be excluded as a potential cause of the diffusion defect without correcting the observed diffusing capacity for hemoglobin. Pulmonary Function Diagnosis: Minimal Diffusion Defect -Pulmonary Vascular  ECHO: LV EF: 65% - 70%  ------------------------------------------------------------------- History: PMH: Dyspnea. Risk factors: Smokless tabacco user. Former tobacco use. Hypertension. Diabetes mellitus. Dyslipidemia.  ------------------------------------------------------------------- Study Conclusions  - Left ventricle: The cavity size was normal. There was mild  concentric hypertrophy. Systolic function was vigorous. The  estimated ejection fraction was in the range of 65% to 70%. Wall  motion was normal; there were no regional wall motion  abnormalities. Doppler parameters are consistent with abnormal  left ventricular relaxation (grade 1 diastolic dysfunction).  There was no evidence of elevated  ventricular filling pressure by  Doppler parameters. - Aortic valve: Trileaflet; normal thickness leaflets. Valve area  (Vmax): 1.58 cm^2. - Aortic root: The aortic root was normal in size. - Left atrium: The atrium was mildly dilated. - Right ventricle: Systolic function was normal. - Right atrium: The atrium was normal in size. - Tricuspid valve: There was trivial regurgitation. - Pulmonary arteries: Systolic pressure was within the normal  range. - Inferior vena cava: The vessel was normal in size. - Pericardium, extracardiac: There was no pericardial effusion.  Transthoracic echocardiography. M-mode, complete 2D, spectral Doppler, and color Doppler. Birthdate: Patient birthdate: December 17, 1940. Age: Patient is 75 yr old. Sex: Gender: male. BMI: 27.8 kg/m^2. Blood pressure: 126/50 Patient status: Outpatient. Study date: Study date: 01/23/2016. Study time: 10:07 AM.  -------------------------------------------------------------------  -------------------------------------------------------------------  Left ventricle: The cavity size was normal. There was mild concentric hypertrophy. Systolic function was vigorous. The estimated ejection fraction was in the range of 65% to 70%. Wall motion was normal; there were no regional wall motion abnormalities. The transmitral flow pattern was normal. The deceleration time of the early transmitral flow velocity was normal. The pulmonary vein flow pattern was normal. The tissue Doppler parameters were normal. Doppler parameters are consistent with abnormal left ventricular relaxation (grade 1 diastolic dysfunction). There was no evidence of elevated ventricular filling pressure by Doppler parameters.  ------------------------------------------------------------------- Aortic valve: Trileaflet; normal thickness leaflets. Mobility was not restricted. Sclerosis without stenosis. Doppler: Transvalvular velocity was  within the normal range. There was no stenosis. There was no regurgitation. Peak velocity ratio of LVOT to aortic valve: 0.62. Valve area (Vmax): 1.58 cm^2. Indexed valve area (Vmax): 0.75 cm^2/m^2. Peak gradient (S): 9 mm Hg.  ------------------------------------------------------------------- Aorta: Aortic root: The aortic root was normal in size.  ------------------------------------------------------------------- Mitral valve: Structurally normal valve. Mobility was not restricted. Doppler: Transvalvular velocity was within the normal range. There was no evidence for stenosis. There was no regurgitation. Peak gradient (D): 2 mm Hg.  ------------------------------------------------------------------- Left atrium: The atrium was mildly dilated.  ------------------------------------------------------------------- Right ventricle: The cavity size was normal. Wall thickness was normal. Systolic function was normal.  ------------------------------------------------------------------- Pulmonic valve: Doppler: Transvalvular velocity was within the normal range. There was no evidence for stenosis.  ------------------------------------------------------------------- Tricuspid valve: Structurally normal valve. Doppler: Transvalvular velocity was within the normal range. There was trivial regurgitation.  ------------------------------------------------------------------- Pulmonary artery: The main pulmonary artery was normal-sized. Systolic pressure was within the normal range.  ------------------------------------------------------------------- Right atrium: The atrium was normal in size.  ------------------------------------------------------------------- Pericardium: There was no pericardial effusion.  ------------------------------------------------------------------- Systemic veins: Inferior vena cava: The vessel was normal in  size.  ------------------------------------------------------------------- Measurements  Left ventricle Value Reference LV ID, ED, PLAX chordal 44.7 mm 43 - 52 LV ID, ES, PLAX chordal 28.9 mm 23 - 38 LV fx shortening, PLAX chordal 35 % >=29 LV PW thickness, ED 12.9 mm --------- IVS/LV PW ratio, ED 1.02 <=1.3 Stroke volume, 2D 64 ml --------- Stroke volume/bsa, 2D 30 ml/m^2 --------- LV ejection fraction, 1-p A4C 57 % --------- LV end-diastolic volume, 2-p [id number] ml --------- LV end-systolic volume, 2-p 52 ml --------- LV ejection fraction, 2-p 60 % --------- Stroke volume, 2-p 78 ml --------- LV end-diastolic volume/bsa, 2-p 62 ml/m^2 --------- LV end-systolic volume/bsa, 2-p 25 ml/m^2 --------- Stroke volume/bsa, 2-p 37.1 ml/m^2 --------- LV e&', lateral 8.09 cm/s --------- LV E/e&', lateral 9.38 --------- LV s&', lateral 16 cm/s --------- LV e&', medial 8.29 cm/s --------- LV E/e&', medial 9.16 --------- LV e&', average 8.19 cm/s --------- LV E/e&', average 9.27 ---------  Ventricular septum Value Reference IVS thickness, ED 13.1 mm ---------  LVOT Value Reference LVOT ID, S 18 mm  --------- LVOT area 2.54 cm^2 --------- LVOT peak velocity, S 93.1 cm/s --------- LVOT mean velocity, S 67 cm/s --------- LVOT VTI, S 25.2 cm ---------  Aortic valve Value Reference Aortic valve peak velocity, S 150 cm/s --------- Aortic peak gradient, S 9 mm Hg --------- Velocity ratio, peak, LVOT/AV 0.62 --------- Aortic valve area, peak velocity 1.58 cm^2 --------- Aortic valve area/bsa, peak 0.75 cm^2/m^2 --------- velocity  Aorta Value Reference Aortic root ID, ED 31 mm ---------  Left atrium Value Reference LA ID, A-P, ES 42 mm --------- LA ID/bsa, A-P 2 cm/m^2 <=2.2 LA volume, S 44 ml --------- LA volume/bsa, S 20.9 ml/m^2 --------- LA volume, ES,  1-p A4C 44 ml --------- LA volume/bsa, ES, 1-p A4C 20.9 ml/m^2 --------- LA volume, ES, 1-p A2C 42 ml --------- LA volume/bsa, ES, 1-p A2C 20 ml/m^2 ---------  Mitral valve Value Reference Mitral E-wave peak velocity 75.9 cm/s --------- Mitral A-wave peak velocity 103 cm/s --------- Mitral deceleration time (H) 306 ms 150 - 230 Mitral peak gradient, D 2 mm Hg --------- Mitral E/A ratio, peak 0.8 --------- Mitral maximal regurg velocity, 341  cm/s --------- PISA  Pulmonary arteries Value Reference PA pressure, S, DP 20 mm Hg <=30  Tricuspid valve Value Reference Tricuspid regurg peak velocity 204 cm/s --------- Tricuspid peak RV-RA gradient 17 mm Hg ---------  Right ventricle Value Reference RV ID, ED, PLAX 30.7 mm 19 - 38  Pulmonic valve Value Reference Pulmonic valve peak velocity, S 80.2 cm/s ---------  Legend: (L) and (H) mark values outside specified reference range.  ------------------------------------------------------------------- Prepared and Electronically Authenticated by  Ena Dawley, M.D. 2017-03-22T12:44:23  Notes Recorded by Sanda Klein, MD on 01/23/2016 at 2:20 PM Both the stress test and the cho are low risk. The aortic valve is sclerotic, but not stenotic. The LVEF is normal. OK to proceed with surgical procedure with Dr. Servando Snare.       Assessment / Plan:   Right middle lobe 1 cm lung nodule enlarging with hypermetabolic right adrenal lesion and hypermetabolic bilateral hilar nodes- without definitive tissue diagnosis. I discussed with the patient and his wife proceeding with surgical resection of the lesion in the right middle lobe. For treatment and DX. Now that right lung lesion has enlarged , Patient is agreeable with proceeding with right VATS, lung resection to obtain tissue dx and treat. Patient and wife will call back office to arrange after they check the" sign" calander.     [name] MD      [address] [place] [address] Office [phone]   Beeper [phone]  04/17/2016 1:33 PM

## 2016-04-17 NOTE — Patient Instructions (Signed)
Lung Cancer Lung cancer occurs when abnormal cells in the lung grow out of control and form a mass (tumor). There are several types of lung cancer. The two most common types are:  Non-small cell. In this type of lung cancer, abnormal cells are larger and grow more slowly than those of small cell lung cancer.  Small cell. In this type of lung cancer, abnormal cells are smaller than those of non-small cell lung cancer. Small cell lung cancer gets worse faster than non-small cell lung cancer. CAUSES  The leading cause of lung cancer is smoking tobacco. The second leading cause is radon exposure. RISK FACTORS  Smoking tobacco.  Exposure to secondhand tobacco smoke.  Exposure to radon gas.  Exposure to asbestos.  Exposure to arsenic in drinking water.  Air pollution.  Family or personal history of lung cancer.  Lung radiation therapy.  Being older than 79 years. SIGNS AND SYMPTOMS  In the early stages, symptoms may not be present. As the cancer progresses, symptoms may include:  A lasting cough, possibly with blood.  Fatigue.  Unexplained weight loss.  Shortness of breath.  Wheezing.  Chest pain.  Loss of appetite. Symptoms of advanced lung cancer include:  Hoarseness.  Bone or joint pain.  Weakness.  Nail problems.  Face or arm swelling.  Paralysis of the face.  Drooping eyelids. DIAGNOSIS  Lung cancer can be identified with a physical exam and with tests such as:  A chest X-ray.  A CT scan.  Blood tests.  A biopsy. After a diagnosis is made, you will have more tests to determine the stage of the cancer. The stages of non-small cell lung cancer are:  Stage 0, also called carcinoma in situ. At this stage, abnormal cells are found in the inner lining of your lung or lungs.  Stage I. At this stage, abnormal cells have grown into a tumor that is no larger than 5 cm across. The cancer has entered the deeper lung tissue but has not yet entered the lymph  nodes or other parts of the body.  Stage II. At this stage, the tumor is 7 cm across or smaller and has entered nearby lymph nodes. Or, the tumor is 5 cm across or smaller and has invaded surrounding tissue but is not found in nearby lymph nodes. There may be more than one tumor present.  Stage III. At this stage, the tumor may be any size. There may be more than one tumor in the lungs. The cancer cells have spread to the lymph nodes and possibly to other organs.  Stage IV. At this stage, there are tumors in both lungs and the cancer has spread to other areas of the body. The stages of small cell lung cancer are:  Limited. At this stage, the cancer is found only on one side of the chest.  Extensive. At this stage, the cancer is in the lungs and in tissues on the other side of the chest. The cancer has spread to other organs or is found in the fluid between the layers of your lungs. TREATMENT  Depending on the type and stage of your lung cancer, you may be treated with:  Surgery. This is done to remove a tumor.  Radiation therapy. This treatment destroys cancer cells using X-rays or other types of radiation.  Chemotherapy. This treatment uses medicines to destroy cancer cells.  Targeted therapy. This treatment aims to destroy only cancer cells instead of all cells as other therapies do. You may  also have a combination of treatments. HOME CARE INSTRUCTIONS   Do not use any tobacco products. This includes cigarettes, chewing tobacco, and electronic cigarettes. If you need help quitting, ask your health care provider.  Take medicines only as directed by your health care provider.  Eat a healthy diet. Work with a dietitian to make sure you are getting the nutrition you need.  Consider joining a support group or seeking counseling to help you cope with the stress of having lung cancer.  Let your cancer specialist (oncologist) know if you are admitted to the hospital.  Keep all follow-up  visits as directed by your health care provider. This is important. SEEK MEDICAL CARE IF:   You lose weight without trying.  You have a persistent cough and wheezing.  You feel short of breath.  You tire easily.  You experience bone or joint pain.  You have difficulty swallowing.  You feel hoarse or notice your voice changing.  Your pain medicine is not helping. SEEK IMMEDIATE MEDICAL CARE IF:   You cough up blood.  You have new breathing problems.  You develop chest pain.  You develop swelling in:  One or both ankles or legs.  Your face, neck, or arms.  You are confused.  You experience paralysis in your face or a drooping eyelid.   This information is not intended to replace advice given to you by your health care provider. Make sure you discuss any questions you have with your health care provider.   Document Released: 01/26/2001 Document Revised: 07/11/2015 Document Reviewed: 02/23/2014 Elsevier Interactive Patient Education 2016 [employer] Lung Resection A lung resection is a procedure to remove part or all of a lung. When an entire lung is removed, the procedure is called a pneumonectomy. When only part of a lung is removed, the procedure is called a lobectomy. A lung resection is typically done to get rid of a tumor or cancer, but it may be done to treat other conditions. This procedure can help relieve some or all of your symptoms and can also help keep the problem from getting worse. Lung resection may provide the best chance for curing your disease. However, the procedure may not necessarily cure lung cancer if that is the problem.  LET YOUR HEALTH CARE PROVIDER KNOW ABOUT:   Any allergies you have.  All medicines you are taking, including vitamins, herbs, eye drops, creams, and over-the-counter medicines.  Previous problems you or members of your family have had with the use of anesthetics.  Any blood disorders you have.  Previous surgeries you have  had.  Medical conditions you have. RISKS AND COMPLICATIONS  Generally, lung resection is a safe procedure. However, problems can occur and include:  Excessive bleeding.  Infection.  Inability to breathe without a ventilator.  Persistent shortness of breath.  Heart problems, including abnormal rhythms and a risk of heart attack or heart failure.  Blood clots.  Injury to a blood vessel.  Injury to a nerve.  Failure to heal properly.  Stroke.  Bronchopleural fistula. This is a small hole between one of the main breathing tubes (bronchus) and the lining of the lungs. This is rare.  Reaction to anesthesia. BEFORE THE PROCEDURE  You may have tests done before the procedure, including:  Blood tests.  Urine tests.  X-rays.  Other imaging tests (such as CT scans, MRI scans, and PET scans). These tests are done to find the exact size and location of the condition being treated with   this surgery.  Pulmonary function tests. These are breathing tests to assess the function of your lungs before surgery and to decide how to best help your breathing after surgery.  Heart testing. This is done to make sure your heart is strong enough for the procedure.  Bronchoscopy. This is a technique that allows your health care provider to look at the inside of your airways. This is done using a soft, flexible tube (bronchoscope). Along with imaging tests, this can help your health care provider know the exact location and size of the area that will be removed during surgery.  Lymph node sampling. This may need to be done to see if the tumor has spread. It may be done as a separate surgery or right before your lung resection procedure. PROCEDURE  An IV tube will be placed in your arm. You will be given a medicine that makes you fall asleep (general anesthetic). You may also get pain medicine through a thin, flexible tube (catheter) in your back.  A breathing tube will be placed in your  throat.  Once the surgical team has prepared you for surgery, your surgeon will make an incision on your side. Some resections are done through large incisions, while others can be done through small incisions using smaller instruments and assisted with small cameras (laparoscopic surgery).  Your surgeon will carefully cut the veins, arteries, and bronchus leading to your lung. After being cut, each of these pieces will be sewn or stapled closed. The lung or part of the lung will then be removed.  Your surgeon will check inside your chest to make sure there is no bleeding in or around the lungs. Lymph nodes near the lung may also be removed for later tests.  Your surgeon may put tubes into your chest to drain extra fluid and air after surgery.  Your incision will be closed. This may be done using:  Stitches that absorb into your body and do not need to be removed.  Stitches that must be removed.  Staples that must be removed. AFTER THE PROCEDURE   You will be taken to the recovery area and your progress will be monitored. You may still have a breathing tube and other tubes or catheters in your body immediately after surgery. These will be removed during your recovery. You may be put on a respirator following surgery if some assistance is needed to help your breathing. When you are awake and not experiencing immediate problems from surgery, you will be moved to the intensive care unit (ICU) where you will continue your recovery.  You may feel pain in your chest and throat. Sometimes during recovery, patients may shiver or feel nauseous. You will be given medicine to help with pain and nausea.  The breathing tube will be taken out as soon as your health care providers feel you can breathe on your own. For most people, this happens on the same day as the surgery.  If your surgery and time in the ICU go well, most of the tubes and equipment will be taken out within 1-2 days after surgery. This  is about how long most people stay in the ICU. You may need to stay longer, depending on how you are doing.  You should also start respiratory therapy in the ICU. This therapy uses breathing exercises to help your other lung stay healthy and get stronger.  As you improve, you will be moved to a regular hospital room for continued respiratory therapy, help with   your bladder and bowels, and to continue medicines.  After your lung or part of your lung is taken out, there will be a space inside your chest. This space will often fill up with fluid over time. The amount of time this takes is different for each person.  You will receive care until you are doing well and your health care provider feels it is safe for you to go home or to transfer to an extended care facility.   This information is not intended to replace advice given to you by your health care provider. Make sure you discuss any questions you have with your health care provider.   Document Released: 01/10/2003 Document Revised: 11/10/2014 Document Reviewed: 12/09/2013 Elsevier Interactive Patient Education 2016 [employer]  Lung Resection, Care After Refer to this sheet in the next few weeks. These instructions provide you with information on caring for yourself after your procedure. Your health care provider may also give you more specific instructions. Your treatment has been planned according to current medical practices, but problems sometimes occur. Call your health care provider if you have any problems or questions after your procedure. WHAT TO EXPECT AFTER THE PROCEDURE After your procedure, it is typical to have the following:   You may feel pain in your chest and throat.  Patients may sometimes shiver or feel nauseous during recovery. HOME CARE INSTRUCTIONS  You may resume a normal diet and activities as directed by your health care provider.  Do not use any tobacco products, including cigarettes, chewing tobacco, or  electronic cigarettes. If you need help quitting, ask your health care provider.  There are many different ways to close and cover an incision, including stitches, skin glue, and adhesive strips. Follow your health care provider's instructions on:  Incision care.  Bandage (dressing) changes and removal.  Incision closure removal.  Take medicines only as directed by your health care provider.  Keep all follow-up visits as directed by your health care provider. This is important.  Try to breathe deeply and cough as directed. Holding a pillow firmly over your ribs may help with discomfort.  If you were given an incentive spirometer in the hospital, continue to use it as directed by your health care provider.  Walk as directed by your health care provider.  You may take a shower and gently wash the area of your incision with water and soap as directed by your health care provider. Do not use anything else to clean your incision except as directed by your health care provider. Do not take baths, swim, or use a hot tub until your health care provider approves. SEEK MEDICAL CARE IF:  You notice redness, swelling, or increasing pain at the incision site.  You are bleeding at the incision site.  You see pus coming from the incision site.  You notice a bad smell coming from the incision site or bandage.  Your incision breaks open.  You cough up blood or pus, or you develop a cough that produces bad-smelling sputum.  You have pain or swelling in your legs.  You have increasing pain that is not controlled with medicine.  You have trouble managing any of the tubes that have been left in place after surgery.  You have fever or chills. SEEK IMMEDIATE MEDICAL CARE IF:   You have chest pain or an irregular or rapid heartbeat.  You have dizzy episodes or faint.  You have shortness of breath or difficulty breathing.  You have persistent nausea   or vomiting.  You have a rash.   This  information is not intended to replace advice given to you by your health care provider. Make sure you discuss any questions you have with your health care provider.   Document Released: 05/09/2005 Document Revised: 11/10/2014 Document Reviewed: 12/09/2013 Elsevier Interactive Patient Education 2016 [employer]  

## 2016-04-28 ENCOUNTER — Other Ambulatory Visit: Payer: Self-pay | Admitting: *Deleted

## 2016-05-01 ENCOUNTER — Telehealth: Payer: Self-pay | Admitting: *Deleted

## 2016-05-01 ENCOUNTER — Other Ambulatory Visit: Payer: Self-pay | Admitting: *Deleted

## 2016-05-01 NOTE — Telephone Encounter (Signed)
Patient called to cancel VATs surgery scheduled with Dr. Servando Snare for Tuesday 7/12.  Didn't give a reason other than he just didn't want surgery.  I offered him a f/u visit to discuss options again with Dr. Servando Snare but he and his wife declined at this time.  I offered a 3 month follow-up several times but they said they will come back in 6 months to see him with a f/u CT scan.  Told patient to call me back if he wants to re-schedule or to see Dr. Servando Snare sooner thatn 6 months.

## 2016-05-09 ENCOUNTER — Other Ambulatory Visit (HOSPITAL_COMMUNITY): Payer: Medicare Other

## 2016-05-13 ENCOUNTER — Inpatient Hospital Stay (HOSPITAL_COMMUNITY): Admission: RE | Admit: 2016-05-13 | Payer: Medicare Other | Source: Ambulatory Visit | Admitting: Cardiothoracic Surgery

## 2016-05-13 ENCOUNTER — Encounter (HOSPITAL_COMMUNITY): Admission: RE | Payer: Self-pay | Source: Ambulatory Visit

## 2016-05-13 SURGERY — BRONCHOSCOPY, VIDEO-ASSISTED
Anesthesia: General | Site: Chest | Laterality: Right

## 2016-09-18 ENCOUNTER — Encounter (HOSPITAL_COMMUNITY): Payer: Self-pay | Admitting: Emergency Medicine

## 2016-09-18 ENCOUNTER — Emergency Department (HOSPITAL_COMMUNITY): Payer: Medicare Other

## 2016-09-18 ENCOUNTER — Emergency Department (HOSPITAL_COMMUNITY)
Admission: EM | Admit: 2016-09-18 | Discharge: 2016-09-19 | Disposition: A | Discharge status: Home / Self Care | Payer: Medicare Other | Attending: Emergency Medicine | Admitting: Emergency Medicine

## 2016-09-18 DIAGNOSIS — Z87891 Personal history of nicotine dependence: Secondary | ICD-10-CM | POA: Diagnosis not present

## 2016-09-18 DIAGNOSIS — S32010A Wedge compression fracture of first lumbar vertebra, initial encounter for closed fracture: Secondary | ICD-10-CM

## 2016-09-18 DIAGNOSIS — I10 Essential (primary) hypertension: Secondary | ICD-10-CM | POA: Diagnosis not present

## 2016-09-18 DIAGNOSIS — R109 Unspecified abdominal pain: Secondary | ICD-10-CM | POA: Insufficient documentation

## 2016-09-18 DIAGNOSIS — Z7982 Long term (current) use of aspirin: Secondary | ICD-10-CM | POA: Diagnosis not present

## 2016-09-18 DIAGNOSIS — S0990XA Unspecified injury of head, initial encounter: Secondary | ICD-10-CM | POA: Diagnosis not present

## 2016-09-18 DIAGNOSIS — S32018A Other fracture of first lumbar vertebra, initial encounter for closed fracture: Secondary | ICD-10-CM | POA: Insufficient documentation

## 2016-09-18 DIAGNOSIS — Y999 Unspecified external cause status: Secondary | ICD-10-CM | POA: Diagnosis not present

## 2016-09-18 DIAGNOSIS — Y929 Unspecified place or not applicable: Secondary | ICD-10-CM | POA: Insufficient documentation

## 2016-09-18 DIAGNOSIS — W1789XA Other fall from one level to another, initial encounter: Secondary | ICD-10-CM | POA: Diagnosis not present

## 2016-09-18 DIAGNOSIS — S3992XA Unspecified injury of lower back, initial encounter: Secondary | ICD-10-CM | POA: Diagnosis present

## 2016-09-18 DIAGNOSIS — Y939 Activity, unspecified: Secondary | ICD-10-CM | POA: Diagnosis not present

## 2016-09-18 DIAGNOSIS — W19XXXA Unspecified fall, initial encounter: Secondary | ICD-10-CM

## 2016-09-18 DIAGNOSIS — R931 Abnormal findings on diagnostic imaging of heart and coronary circulation: Secondary | ICD-10-CM | POA: Insufficient documentation

## 2016-09-18 DIAGNOSIS — E119 Type 2 diabetes mellitus without complications: Secondary | ICD-10-CM | POA: Diagnosis not present

## 2016-09-18 LAB — CBC WITH DIFFERENTIAL/PLATELET
Basophils Absolute: 0 10*3/uL (ref 0.0–0.1)
Basophils Relative: 0 %
Eosinophils Absolute: 0.1 10*3/uL (ref 0.0–0.7)
Eosinophils Relative: 1 %
HCT: 35.1 % — ABNORMAL LOW (ref 39.0–52.0)
Hemoglobin: 12 g/dL — ABNORMAL LOW (ref 13.0–17.0)
Lymphocytes Relative: 13 %
Lymphs Abs: 0.7 10*3/uL (ref 0.7–4.0)
MCH: 34.2 pg — ABNORMAL HIGH (ref 26.0–34.0)
MCHC: 34.2 g/dL (ref 30.0–36.0)
MCV: 100 fL (ref 78.0–100.0)
Monocytes Absolute: 0.3 10*3/uL (ref 0.1–1.0)
Monocytes Relative: 5 %
Neutro Abs: 4.5 10*3/uL (ref 1.7–7.7)
Neutrophils Relative %: 81 %
Platelets: 127 10*3/uL — ABNORMAL LOW (ref 150–400)
RBC: 3.51 MIL/uL — ABNORMAL LOW (ref 4.22–5.81)
RDW: 12.6 % (ref 11.5–15.5)
WBC: 5.5 10*3/uL (ref 4.0–10.5)

## 2016-09-18 LAB — I-STAT CHEM 8, ED
BUN: 24 mg/dL — ABNORMAL HIGH (ref 6–20)
Calcium, Ion: 1.13 mmol/L — ABNORMAL LOW (ref 1.15–1.40)
Chloride: 103 mmol/L (ref 101–111)
Creatinine, Ser: 1.2 mg/dL (ref 0.61–1.24)
Glucose, Bld: 113 mg/dL — ABNORMAL HIGH (ref 65–99)
HCT: 34 % — ABNORMAL LOW (ref 39.0–52.0)
Hemoglobin: 11.6 g/dL — ABNORMAL LOW (ref 13.0–17.0)
Potassium: 3.8 mmol/L (ref 3.5–5.1)
Sodium: 139 mmol/L (ref 135–145)
TCO2: 25 mmol/L (ref 0–100)

## 2016-09-18 LAB — URINALYSIS, ROUTINE W REFLEX MICROSCOPIC
Bilirubin Urine: NEGATIVE
Glucose, UA: NEGATIVE mg/dL
Hgb urine dipstick: NEGATIVE
Ketones, ur: 15 mg/dL — AB
Leukocytes, UA: NEGATIVE
Nitrite: NEGATIVE
Protein, ur: NEGATIVE mg/dL
Specific Gravity, Urine: 1.023 (ref 1.005–1.030)
pH: 6 (ref 5.0–8.0)

## 2016-09-18 MED ORDER — ACETAMINOPHEN 325 MG PO TABS
650.0000 mg | ORAL_TABLET | Freq: Once | ORAL | Status: AC
  Administered 2016-09-19: 650 mg via ORAL
  Filled 2016-09-18: qty 2

## 2016-09-18 MED ORDER — IOPAMIDOL (ISOVUE-300) INJECTION 61%
INTRAVENOUS | Status: AC
  Administered 2016-09-18: 100 mL
  Filled 2016-09-18: qty 100

## 2016-09-18 MED ORDER — FENTANYL CITRATE (PF) 100 MCG/2ML IJ SOLN
50.0000 ug | Freq: Once | INTRAMUSCULAR | Status: AC
  Administered 2016-09-18: 50 ug via INTRAVENOUS
  Filled 2016-09-18: qty 2

## 2016-09-18 MED ORDER — ONDANSETRON HCL 4 MG/2ML IJ SOLN
4.0000 mg | Freq: Once | INTRAMUSCULAR | Status: AC
  Administered 2016-09-18: 4 mg via INTRAVENOUS
  Filled 2016-09-18: qty 2

## 2016-09-18 NOTE — ED Provider Notes (Signed)
[facility] DEPT Provider Note   CSN: [id number] Arrival date & time: 09/18/16  1657     History   Chief Complaint Chief Complaint  Patient presents with  . Fall    HPI James Sweeney is a 75 y.o. male.  James Sweeney is a 75 y.o. male with h/o hyperlipidemia, T2DM, cervical spondylosis without myelopathy, HTN, BPH presents to ED s/p fall. Patient reports falling from 15 foot deer stand this evening. He states he landed on his bottom and low back, hitting the back of his head on the ground. Son reports that the deer stand subsequently fell on his abdomen. He denies any LOC. He takes 81mg  ASA daily, no other anti-coagulation. He complains of neck pain, low back pain, and abdominal pain. Per son, patient described abdominal pain earlier as "everything ripped." Patient is able to ambulate; however, painful. He denies fever, trouble swallowing, changes in vision, SOB, CP, N/V, hematuria, rash, facial droop, slurred speech, dizziness, lightheadedness, loss of bowel/bladder function, weakness, saddle anesthesia, h/o cancer, loss of bowel/bladder function, or h/o cancer. He took a tramadol PTA with minimal relief of sxs.       Past Medical History:  Diagnosis Date  . Cervical spondylosis without myelopathy 12/01/2013  . Diabetes mellitus    borderline  . Enlarged prostate   . Hearing deficit   . Heart murmur   . History of kidney stones    removed with Lithotripsy  . HOH (hard of hearing)    right ear  . Hyperlipemia   . Hypertension   . Lumbago 12/01/2013  . Pneumonia 2016  . Polio    right leg smaller than left  . Shortness of breath dyspnea    with exertion    Patient Active Problem List   Diagnosis Date Noted  . Pneumothorax 01/25/2016  . Pneumothorax after biopsy 01/25/2016  . Coronary artery calcification seen on CT scan 01/21/2016  . Murmur, cardiac 01/21/2016  . Hyperlipidemia 01/21/2016  . Essential hypertension 01/09/2016  . BPH (benign prostatic  hyperplasia) 01/09/2016  . Solitary pulmonary nodule 01/09/2016  . Thrombocytopenia ([place]) 01/09/2016  . Cervical spondylosis without myelopathy 12/01/2013  . Lumbago 12/01/2013  . Diabetes mellitus     Past Surgical History:  Procedure Laterality Date  . CATARACT EXTRACTION Bilateral 2014  . VIDEO BRONCHOSCOPY WITH ENDOBRONCHIAL NAVIGATION N/A 01/25/2016   Procedure: VIDEO BRONCHOSCOPY WITH ENDOBRONCHIAL NAVIGATION;  Surgeon: [name], MD;  Location: [facility];  Service: Thoracic;  Laterality: N/A;  . VIDEO BRONCHOSCOPY WITH ENDOBRONCHIAL ULTRASOUND N/A 01/25/2016   Procedure: VIDEO BRONCHOSCOPY WITH ENDOBRONCHIAL ULTRASOUND;  Surgeon: [name], MD;  Location: [facility];  Service: Thoracic;  Laterality: N/A;       Home Medications    Prior to Admission medications   Medication Sig Start Date End Date Taking? Authorizing Provider  acetaminophen (TYLENOL) 325 MG tablet Take 2 tablets (650 mg total) by mouth every 6 (six) hours as needed for mild pain or fever. 01/28/16  Yes Erin R Barrett, PA-C  alfuzosin (UROXATRAL) 10 MG 24 hr tablet Take 10 mg by mouth daily.   Yes Historical Provider, MD  amLODipine (NORVASC) 10 MG tablet Take 10 mg by mouth daily.   Yes Historical Provider, MD  Ascorbic Acid (VITAMIN C) 100 MG tablet Take 1,000 mg by mouth daily.    Yes Historical Provider, MD  aspirin EC 81 MG EC tablet Take 1 tablet (81 mg total) by mouth daily. 01/28/16  Yes Erin R Barrett, PA-C  Cyanocobalamin (VITAMIN B-12) 5000 MCG SUBL Place 1 tablet under the tongue daily.   Yes Historical Provider, MD  finasteride (PROSCAR) 5 MG tablet Take 5 mg by mouth daily. 05/16/15  Yes Historical Provider, MD  lisinopril-hydrochlorothiazide (PRINZIDE,ZESTORETIC) 20-25 MG per tablet Take 1 tablet by mouth daily. 03/12/15  Yes Historical Provider, MD  Omega-3 Fatty Acids (FISH OIL) 1000 MG CAPS Take 1,000 mg by mouth daily.   Yes Historical Provider, MD  Propylene Glycol (SYSTANE BALANCE) 0.6 %  SOLN Place 1 drop into both eyes every other day. Every other night before bed   Yes Historical Provider, MD  simvastatin (ZOCOR) 20 MG tablet Take 20 mg by mouth every evening.   Yes Historical Provider, MD  traMADol (ULTRAM) 50 MG tablet Take 1 tablet (50 mg total) by mouth every 6 (six) hours as needed. 01/28/16  Yes Erin R Barrett, PA-C  vitamin E 400 UNIT capsule Take 400 Units by mouth daily.   Yes Historical Provider, MD  HYDROcodone-acetaminophen (NORCO/VICODIN) 5-325 MG tablet Take 1 tablet by mouth every 6 (six) hours as needed. 09/19/16   Roxanna Mew, PA-C    Family History Family History  Problem Relation Age of Onset  . Cancer Mother     Stomach  . Cancer - Lung Father   . Heart attack Brother   . Diabetes Brother     Social History Social History  Substance Use Topics  . Smoking status: Former Smoker    Packs/day: 0.50    Years: 9.00    Types: Cigarettes    Quit date: 12/02/1967  . Smokeless tobacco: Current User    Types: Chew  . Alcohol use No     Allergies   Levaquin [levofloxacin in d5w] and Penicillins   Review of Systems Review of Systems  Constitutional: Negative for chills, diaphoresis and fever.  HENT: Negative for trouble swallowing.   Eyes: Negative for visual disturbance.  Respiratory: Negative for shortness of breath.   Cardiovascular: Negative for chest pain.  Gastrointestinal: Positive for abdominal pain. Negative for blood in stool, constipation, diarrhea, nausea and vomiting.  Genitourinary: Negative for hematuria.  Musculoskeletal: Positive for back pain and neck pain.  Skin: Negative for rash.  Neurological: Positive for numbness. Negative for dizziness, syncope, facial asymmetry, speech difficulty, weakness, light-headedness and headaches.     Physical Exam Updated Vital Signs BP 150/62   Pulse 62   Temp 98 F (36.7 C) (Oral)   Resp 18   SpO2 97%   Physical Exam  Constitutional: He appears well-developed and  well-nourished. No distress.  HENT:  Head: Normocephalic and atraumatic. Head is without raccoon's eyes and without Battle's sign.  Right Ear: No hemotympanum.  Mouth/Throat: Uvula is midline, oropharynx is clear and moist and mucous membranes are normal. No trismus in the jaw. No oropharyngeal exudate.  No battle sign or raccoon eyes. No hematoma. No palpable defect on skull.   Eyes: Conjunctivae and EOM are normal. Pupils are equal, round, and reactive to light. Right eye exhibits no discharge. Left eye exhibits no discharge. No scleral icterus.  Neck: Normal range of motion and phonation normal. Neck supple. No neck rigidity. Normal range of motion present.  Cardiovascular: Normal rate, regular rhythm, normal heart sounds and intact distal pulses.   No murmur heard. Pulmonary/Chest: Effort normal and breath sounds normal. No stridor. No respiratory distress. He has no wheezes. He has no rales.  No TTP of anterior chest wall.   Abdominal: Soft. Bowel sounds are normal.  He exhibits no distension. There is tenderness. There is no rigidity, no rebound, no guarding and no CVA tenderness.  Mild tenderness of abdomen. No rebound, rigidity, or peritoneal signs. No bruising or abrasions noted.   Musculoskeletal: Normal range of motion.  TTP of midline cervical spine and lumbar spine. No obvious deformity or step off. TTP of b/l lumbar paravertebral muscles.   Lymphadenopathy:    He has no cervical adenopathy.  Neurological: He is alert. He is not disoriented. Coordination and gait normal. GCS eye subscore is 4. GCS verbal subscore is 5. GCS motor subscore is 6.  Mental Status:  Alert, thought content appropriate, able to give a coherent history. Speech fluent without evidence of aphasia. Able to follow 2 step commands without difficulty.  Cranial Nerves:  II: pupils equal, round, reactive to light III,IV, VI: ptosis not present, extra-ocular motions intact bilaterally  V,VII: smile symmetric,  facial light touch sensation equal VIII: hearing grossly normal to voice  X: uvula elevates symmetrically  XI: bilateral shoulder shrug symmetric and strong XII: midline tongue extension without fassiculations Motor:  Normal tone. 5/5 in upper and lower extremities bilaterally including strong and equal grip strength and dorsiflexion/plantar flexion Sensory: light touch normal in all extremities. Cerebellar: normal finger-to-nose with bilateral upper extremities Gait: ambulatory CV: distal pulses palpable throughout   Skin: Skin is warm and dry. He is not diaphoretic.  Psychiatric: He has a normal mood and affect. His behavior is normal.     ED Treatments / Results  Labs (all labs ordered are listed, but only abnormal results are displayed) Labs Reviewed  CBC WITH DIFFERENTIAL/PLATELET - Abnormal; Notable for the following:       Result Value   RBC 3.51 (*)    Hemoglobin 12.0 (*)    HCT 35.1 (*)    MCH 34.2 (*)    Platelets 127 (*)    All other components within normal limits  URINALYSIS, ROUTINE W REFLEX MICROSCOPIC (NOT AT [facility]) - Abnormal; Notable for the following:    Ketones, ur 15 (*)    All other components within normal limits  I-STAT CHEM 8, ED - Abnormal; Notable for the following:    BUN 24 (*)    Glucose, Bld 113 (*)    Calcium, Ion 1.13 (*)    Hemoglobin 11.6 (*)    HCT 34.0 (*)    All other components within normal limits    EKG  EKG Interpretation None       Radiology Ct Head Wo Contrast  Result Date: 09/18/2016 CLINICAL DATA:  75 y/o M; status post fall with severe lower back pain and injury to head. EXAM: CT HEAD WITHOUT CONTRAST CT NECK WITH CONTRAST TECHNIQUE: Contiguous axial images were obtained from the base of the skull through the vertex without contrast. Multidetector CT imaging of the neck was performed using the standard protocol with intravenous contrast. COMPARISON:  08/10/2013 cervical MRI CONTRAST:  100 cc Isovue 300 FINDINGS: CT HEAD  FINDINGS Brain: No evidence of acute infarction, hemorrhage, hydrocephalus, extra-axial collection or mass lesion/mass effect. Mild chronic microvascular ischemic changes and parenchymal volume loss of the brain. Vascular: No hyperdense vessel. Calcific atherosclerosis of carotid siphons. Skull: Normal. Negative for fracture or focal lesion. Sinuses/Orbits: No acute finding. Bilateral intra-ocular lens replacement. Other: None. CT NECK FINDINGS Pharynx and larynx: Normal. No mass or swelling. Salivary glands: No inflammation, mass, or stone. Thyroid: Multiple nodules the largest measuring 12 mm on the right. Lymph nodes: None enlarged or abnormal density.  Vascular: Left-greater-than-right calcific atherosclerosis of bifurcations and proximal ICA without hemodynamically significant stenosis. Bilateral vertebral arteries are patent. Internal jugular veins are patent. Mastoids and visualized paranasal sinuses: Clear. Skeleton: No acute fracture or dislocation. Straightening of cervical lordosis and mild reversal at C4-5. No listhesis. Discogenic degenerative changes greatest at the C5 through C7 levels with disc space narrowing and marginal osteophytes. Multiple levels of canal stenosis moderate to severe at the C5-6 level (series 8, image 76). Multiple levels of moderate bony foraminal narrowing Upper chest: Please refer to the concurrent CT of the chest for evaluations of lungs, mediastinum, and thoracic spine. Other: None. IMPRESSION: 1. No acute intracranial abnormality. 2. No acute fracture or dislocation of the cervical spine. 3. Multinodular thyroid. If clinically indicated this can be further assessed with thyroid ultrasound on a nonemergent basis. Electronically Signed   By: Kristine Garbe M.D.   On: 09/18/2016 21:23   Ct Soft Tissue Neck W Contrast  Result Date: 09/18/2016 CLINICAL DATA:  75 y/o M; status post fall with severe lower back pain and injury to head. EXAM: CT HEAD WITHOUT CONTRAST  CT NECK WITH CONTRAST TECHNIQUE: Contiguous axial images were obtained from the base of the skull through the vertex without contrast. Multidetector CT imaging of the neck was performed using the standard protocol with intravenous contrast. COMPARISON:  08/10/2013 cervical MRI CONTRAST:  100 cc Isovue 300 FINDINGS: CT HEAD FINDINGS Brain: No evidence of acute infarction, hemorrhage, hydrocephalus, extra-axial collection or mass lesion/mass effect. Mild chronic microvascular ischemic changes and parenchymal volume loss of the brain. Vascular: No hyperdense vessel. Calcific atherosclerosis of carotid siphons. Skull: Normal. Negative for fracture or focal lesion. Sinuses/Orbits: No acute finding. Bilateral intra-ocular lens replacement. Other: None. CT NECK FINDINGS Pharynx and larynx: Normal. No mass or swelling. Salivary glands: No inflammation, mass, or stone. Thyroid: Multiple nodules the largest measuring 12 mm on the right. Lymph nodes: None enlarged or abnormal density. Vascular: Left-greater-than-right calcific atherosclerosis of bifurcations and proximal ICA without hemodynamically significant stenosis. Bilateral vertebral arteries are patent. Internal jugular veins are patent. Mastoids and visualized paranasal sinuses: Clear. Skeleton: No acute fracture or dislocation. Straightening of cervical lordosis and mild reversal at C4-5. No listhesis. Discogenic degenerative changes greatest at the C5 through C7 levels with disc space narrowing and marginal osteophytes. Multiple levels of canal stenosis moderate to severe at the C5-6 level (series 8, image 76). Multiple levels of moderate bony foraminal narrowing Upper chest: Please refer to the concurrent CT of the chest for evaluations of lungs, mediastinum, and thoracic spine. Other: None. IMPRESSION: 1. No acute intracranial abnormality. 2. No acute fracture or dislocation of the cervical spine. 3. Multinodular thyroid. If clinically indicated this can be further  assessed with thyroid ultrasound on a nonemergent basis. Electronically Signed   By: Kristine Garbe M.D.   On: 09/18/2016 21:23   Ct Chest W Contrast  Result Date: 09/18/2016 CLINICAL DATA:  Status post fall 12 feet from deer stand onto back. Patient on blood thinners. Lower back pain, acute onset. Initial encounter. EXAM: CT CHEST, ABDOMEN, AND PELVIS WITH CONTRAST TECHNIQUE: Multidetector CT imaging of the chest, abdomen and pelvis was performed following the standard protocol during bolus administration of intravenous contrast. CONTRAST:  128mL ISOVUE-300 IOPAMIDOL (ISOVUE-300) INJECTION 61% COMPARISON:  CT of the abdomen and pelvis from 02/29/2016, and CT of the chest performed 04/17/2016 FINDINGS: CT CHEST FINDINGS Cardiovascular: Diffuse coronary artery calcifications are seen. The heart is borderline normal in size. Scattered calcification is noted along the  thoracic aorta. The great vessels are grossly unremarkable in appearance. There is no evidence of aortic injury. No venous hemorrhage is seen. Mediastinum/Nodes: A 1.2 cm node is noted at the subcarinal region. A mildly calcified 8 mm node is noted in the periaortic region. No pericardial effusion is identified. The visualized portions of the thyroid gland are unremarkable. No axillary lymphadenopathy is seen. Lungs/Pleura: Mild bibasilar atelectasis is noted. New peripheral opacity at the right upper lobe could reflect a mild infectious or inflammatory process. A 1.7 x 1.4 cm nodule is noted at the right middle lobe, mildly increased in size from the prior study and suspicious for primary bronchogenic malignancy. No pleural effusion or pneumothorax is seen. There is no evidence of pulmonary parenchymal contusion. Musculoskeletal: No acute osseous abnormalities are identified. Minimal nonspecific foci of high density are noted along the right fourth anterolateral rib, likely benign in nature. The visualized musculature is unremarkable in  appearance. CT ABDOMEN PELVIS FINDINGS Hepatobiliary: Scattered nonspecific hypodensities are noted within the liver, measuring up to 1.6 cm in size. The gallbladder is unremarkable in appearance. The common bile duct remains normal in caliber. Pancreas: The pancreas is unremarkable in appearance. Spleen: The spleen is within normal limits. Adrenals/Urinary Tract: A 3.5 x 3.0 cm right adrenal lesion is noted, with centrally decreased attenuation. The left adrenal gland is unremarkable. Prominent bilateral renal parapelvic cysts are noted. Nonspecific perinephric stranding is noted bilaterally. There is no evidence of hydronephrosis. No renal or ureteral stones are identified. Stomach/Bowel: The stomach is unremarkable in appearance. The small bowel is within normal limits. The appendix is normal in caliber, without evidence of appendicitis. The colon is unremarkable in appearance. Vascular/Lymphatic: Diffuse calcification is seen along the abdominal aorta and its branches. The abdominal aorta is otherwise grossly unremarkable. The inferior vena cava is grossly unremarkable. No retroperitoneal lymphadenopathy is seen. No pelvic sidewall lymphadenopathy is identified. Reproductive: The bladder is mildly distended and grossly unremarkable. The prostate is enlarged, measuring 5.5 cm in transverse dimension, with impression on the base of the bladder. Other: No free air or free fluid is seen within the abdomen or pelvis. There is no evidence of solid or hollow organ injury. Musculoskeletal: There is a mild compression fracture involving the anterior superior endplate of L1, with only minimal loss of height. There is no evidence of retropulsion or extension to the posterior elements. The visualized musculature is unremarkable in appearance. IMPRESSION: 1. Mild compression fracture involving the anterior superior endplate of L1, with only minimal loss of height. No evidence of retropulsion or extension to the posterior  elements. 2. No additional evidence for traumatic injury to the chest, abdomen or pelvis. 3. 1.7 x 1.4 cm nodule at the right middle lung lobe has mildly increased in size from the prior study and is suspicious for enlarging primary bronchogenic carcinoma. Tissue diagnosis is recommended. 4. New peripheral opacity in the right upper lung lobe could reflect a mild infectious or inflammatory process. 5. 1.2 cm subcarinal node noted, of uncertain significance. 6. Diffuse coronary artery calcifications seen. 7. Small bibasilar atelectasis noted. 8. 3.5 x 3.0 cm right adrenal lesion noted, with centrally decreased attenuation. This has increased in size from prior studies, and malignancy cannot be excluded. Would correlate with adrenal lab values. Adrenal protocol MRI or CT is recommended for further evaluation. 9. Scattered nonspecific hypodensities within the liver, measuring up to 1.6 cm in size. 10. Diffuse aortic atherosclerosis. 11. Enlarged prostate noted, with impression on the base of the bladder. Would  correlate with PSA. 12. Prominent bilateral renal parapelvic cysts noted. These results were called by telephone at the time of interpretation on 09/18/2016 at 9:34 pm to the ER clinical team, who verbally acknowledged these results. Electronically Signed   By: Garald Balding M.D.   On: 09/18/2016 21:42   Ct Abdomen Pelvis W Contrast  Result Date: 09/18/2016 CLINICAL DATA:  Status post fall 12 feet from deer stand onto back. Patient on blood thinners. Lower back pain, acute onset. Initial encounter. EXAM: CT CHEST, ABDOMEN, AND PELVIS WITH CONTRAST TECHNIQUE: Multidetector CT imaging of the chest, abdomen and pelvis was performed following the standard protocol during bolus administration of intravenous contrast. CONTRAST:  163mL ISOVUE-300 IOPAMIDOL (ISOVUE-300) INJECTION 61% COMPARISON:  CT of the abdomen and pelvis from 02/29/2016, and CT of the chest performed 04/17/2016 FINDINGS: CT CHEST FINDINGS  Cardiovascular: Diffuse coronary artery calcifications are seen. The heart is borderline normal in size. Scattered calcification is noted along the thoracic aorta. The great vessels are grossly unremarkable in appearance. There is no evidence of aortic injury. No venous hemorrhage is seen. Mediastinum/Nodes: A 1.2 cm node is noted at the subcarinal region. A mildly calcified 8 mm node is noted in the periaortic region. No pericardial effusion is identified. The visualized portions of the thyroid gland are unremarkable. No axillary lymphadenopathy is seen. Lungs/Pleura: Mild bibasilar atelectasis is noted. New peripheral opacity at the right upper lobe could reflect a mild infectious or inflammatory process. A 1.7 x 1.4 cm nodule is noted at the right middle lobe, mildly increased in size from the prior study and suspicious for primary bronchogenic malignancy. No pleural effusion or pneumothorax is seen. There is no evidence of pulmonary parenchymal contusion. Musculoskeletal: No acute osseous abnormalities are identified. Minimal nonspecific foci of high density are noted along the right fourth anterolateral rib, likely benign in nature. The visualized musculature is unremarkable in appearance. CT ABDOMEN PELVIS FINDINGS Hepatobiliary: Scattered nonspecific hypodensities are noted within the liver, measuring up to 1.6 cm in size. The gallbladder is unremarkable in appearance. The common bile duct remains normal in caliber. Pancreas: The pancreas is unremarkable in appearance. Spleen: The spleen is within normal limits. Adrenals/Urinary Tract: A 3.5 x 3.0 cm right adrenal lesion is noted, with centrally decreased attenuation. The left adrenal gland is unremarkable. Prominent bilateral renal parapelvic cysts are noted. Nonspecific perinephric stranding is noted bilaterally. There is no evidence of hydronephrosis. No renal or ureteral stones are identified. Stomach/Bowel: The stomach is unremarkable in appearance. The  small bowel is within normal limits. The appendix is normal in caliber, without evidence of appendicitis. The colon is unremarkable in appearance. Vascular/Lymphatic: Diffuse calcification is seen along the abdominal aorta and its branches. The abdominal aorta is otherwise grossly unremarkable. The inferior vena cava is grossly unremarkable. No retroperitoneal lymphadenopathy is seen. No pelvic sidewall lymphadenopathy is identified. Reproductive: The bladder is mildly distended and grossly unremarkable. The prostate is enlarged, measuring 5.5 cm in transverse dimension, with impression on the base of the bladder. Other: No free air or free fluid is seen within the abdomen or pelvis. There is no evidence of solid or hollow organ injury. Musculoskeletal: There is a mild compression fracture involving the anterior superior endplate of L1, with only minimal loss of height. There is no evidence of retropulsion or extension to the posterior elements. The visualized musculature is unremarkable in appearance. IMPRESSION: 1. Mild compression fracture involving the anterior superior endplate of L1, with only minimal loss of height. No evidence of  retropulsion or extension to the posterior elements. 2. No additional evidence for traumatic injury to the chest, abdomen or pelvis. 3. 1.7 x 1.4 cm nodule at the right middle lung lobe has mildly increased in size from the prior study and is suspicious for enlarging primary bronchogenic carcinoma. Tissue diagnosis is recommended. 4. New peripheral opacity in the right upper lung lobe could reflect a mild infectious or inflammatory process. 5. 1.2 cm subcarinal node noted, of uncertain significance. 6. Diffuse coronary artery calcifications seen. 7. Small bibasilar atelectasis noted. 8. 3.5 x 3.0 cm right adrenal lesion noted, with centrally decreased attenuation. This has increased in size from prior studies, and malignancy cannot be excluded. Would correlate with adrenal lab  values. Adrenal protocol MRI or CT is recommended for further evaluation. 9. Scattered nonspecific hypodensities within the liver, measuring up to 1.6 cm in size. 10. Diffuse aortic atherosclerosis. 11. Enlarged prostate noted, with impression on the base of the bladder. Would correlate with PSA. 12. Prominent bilateral renal parapelvic cysts noted. These results were called by telephone at the time of interpretation on 09/18/2016 at 9:34 pm to the ER clinical team, who verbally acknowledged these results. Electronically Signed   By: Garald Balding M.D.   On: 09/18/2016 21:42    Procedures Procedures (including critical care time)  Medications Ordered in ED Medications  fentaNYL (SUBLIMAZE) injection 50 mcg (50 mcg Intravenous Given 09/18/16 1922)  iopamidol (ISOVUE-300) 61 % injection (100 mLs  Contrast Given 09/18/16 2043)  ondansetron (ZOFRAN) injection 4 mg (4 mg Intravenous Given 09/18/16 1921)  fentaNYL (SUBLIMAZE) injection 50 mcg (50 mcg Intravenous Given 09/18/16 2151)  acetaminophen (TYLENOL) tablet 650 mg (650 mg Oral Given 09/19/16 0013)     Initial Impression / Assessment and Plan / ED Course  I have reviewed the triage vital signs and the nursing notes.  Pertinent labs & imaging results that were available during my care of the patient were reviewed by me and considered in my medical decision making (see chart for details).  Clinical Course as of Sep 19 53  Thu Sep 18, 2016  2015 On re-evaluation patient endorses slight improvement in pain. Will redose pain medication.   [AM]  2200 CT scans reviewed.   [AM]    Clinical Course User Index [AM] Roxanna Mew, PA-C    Patient presents to ED s/p fall from 68ft onto back. Patient is afebrile and non-toxic appearing in NAD. VSS. No battle sign or raccoon eyes. No palpable defect on palpation of skull. TTP of cervical and lumbar spine. TTP of b/l lumbar paravertebral muscles. TTP of abdomen without bruising or abrasions.  No chest wall tenderness. Lungs are CTABL. Heart RRR. No focal neuro deficits on exam. No loss of bowel or bladder function, no saddle anesthesia, low suspicion at this time for cauda equina. Given age, height of fall, sxs, and physical exam will CT head, neck, chest, and abdomen. Will check basic labs. Pain medication initiated. Shared visit with Dr. Eulis Foster, agrees with plan.   Anemia stable at 12.0. Creatinine nml. U/A negative for hematuria - low suspicion for kidney injury. CT head shows no acute intracranial pathology. CT neck shows no obvious fracture or subluxation; of incidental finding is multiple nodules in thyroid. L1 compression fracture with minimal loss of height; no other traumatic injury noted in chest, abdomen, or pelvis. Of incidental finding 1)increasing RML nodule - patient and family aware of this finding and is being followed by specialist; 2) opacity in RUL - patient  is afebrile, no SOB/CP, no hypoxia, no leukocytosis, doubt infectious etiology; 3) right adrenal gland lesion, cannot exclude malignancy; 4) b/l renal cysts; 5) enlarged prostate - pt h/o BPH, followed by PCP; atherosclerosis noted, bibasilar atelectasis, and non specific hypodensities in liver.   On re-evaluation patient endorses improvement in pain. Discussed results and plan with patient. Encouraged patient to follow up with PCP regarding CT findings. Follow up with PCP regarding compression fracture. Symptomatic management discussed. Rx vicodin for pain. Discussed importance of not mixing sedating medications. Encouraged stool softner given compression fracture and narcotic rx. Review of [place] controlled substance database shows tramadol rx 07/21/16 for pain relief. Return precautions given. Pt voiced understanding and is agreeable.    Final Clinical Impressions(s) / ED Diagnoses   Final diagnoses:  Fall, initial encounter  Closed compression fracture of first lumbar vertebra, initial encounter (HCC)    New  Prescriptions New Prescriptions   HYDROCODONE-ACETAMINOPHEN (NORCO/VICODIN) 5-325 MG TABLET    Take 1 tablet by mouth every 6 (six) hours as needed.     Roxanna Mew, PA-C 09/19/16 0111    Daleen Bo, MD 09/19/16 [phone]

## 2016-09-18 NOTE — ED Notes (Signed)
Patient transported to CT 

## 2016-09-18 NOTE — ED Triage Notes (Signed)
Pt states he was taking a deer stand down today and fell approx 12 feet on back. Pt denies LOC. Pt his head. Pt is on blood thinners. Denies N/V. Pt able to ambulate but in severe pain.

## 2016-09-18 NOTE — ED Provider Notes (Signed)
  Face-to-face evaluation   History: The patient presents for evaluation of injuries from fall. He states he was 15 feet up and understand that "gave way". He landed directly on his buttocks. He complains of pain in the low back, the abdomen, the head, and the neck.  Physical exam: Alert, lucid, cooperative. Abdomen tender, upper bilateral. Back tender right lower and over left posterior ribs. No crepitation of the chest wall. No respiratory distress.  Medical screening examination/treatment/procedure(s) were conducted as a shared visit with non-physician practitioner(s) and myself.  I personally evaluated the patient during the encounter   Daleen Bo, MD 09/19/16 2355

## 2016-09-19 MED ORDER — HYDROCODONE-ACETAMINOPHEN 5-325 MG PO TABS: 1.0000 | ORAL_TABLET | Freq: Four times a day (QID) | ORAL | 0 refills | Status: DC | PRN

## 2016-09-19 NOTE — ED Notes (Signed)
Patient ambulated in hallway with standby assist. Gait steady. No acute distress noted.

## 2016-09-19 NOTE — Discharge Instructions (Signed)
Read the information below.  There was no blood in your urine. Your hemoglobin was stable.  You have a compression fracture of your L1 vertebra. Sometimes this can cause constipation. Be sure to stay well hydrated, you can take colace to help keep stool soft. I have prescribed vicodin for pain relief. This medication can make you constipated and drowsy. Do not mix with other sedatives such as tramadol. This medication also has tylenol, do not take tylenol in addition to the vicodin.  The CT of your head did not show any acute intracranial changes.  The CT of your cervical spine did not show any fracture or dislocation; of incidental finding is multiple nodules in your thyroid.  The CT of your chest is remarkable for an increasing right middle lobe nodule concerning for malignancy.  The CT of your abdomen is remarkable for a lesion on your right adrenal gland. You also have cysts in both your kidneys. Your prostate is enlarged, be sure to have your doctor follow with PSA.  Please follow up with your primary doctor regarding the 1) compression fracture, 2)multiple nodules in thyroid, 3)lung nodule, 4) adrenal mass.  Use the prescribed medication as directed.  Please discuss all new medications with your pharmacist.   You may return to the Emergency Department at any time for worsening condition or any new symptoms that concern you.

## 2016-09-21 ENCOUNTER — Encounter (HOSPITAL_COMMUNITY): Payer: Self-pay

## 2016-09-21 ENCOUNTER — Emergency Department (HOSPITAL_COMMUNITY): Payer: Medicare Other

## 2016-09-21 ENCOUNTER — Emergency Department (HOSPITAL_COMMUNITY)
Admission: EM | Admit: 2016-09-21 | Discharge: 2016-09-21 | Disposition: A | Discharge status: Home / Self Care | Payer: Medicare Other | Attending: Emergency Medicine | Admitting: Emergency Medicine

## 2016-09-21 DIAGNOSIS — Z7982 Long term (current) use of aspirin: Secondary | ICD-10-CM | POA: Diagnosis not present

## 2016-09-21 DIAGNOSIS — I10 Essential (primary) hypertension: Secondary | ICD-10-CM | POA: Insufficient documentation

## 2016-09-21 DIAGNOSIS — S32018D Other fracture of first lumbar vertebra, subsequent encounter for fracture with routine healing: Secondary | ICD-10-CM | POA: Insufficient documentation

## 2016-09-21 DIAGNOSIS — T474X5A Adverse effect of other laxatives, initial encounter: Secondary | ICD-10-CM | POA: Diagnosis not present

## 2016-09-21 DIAGNOSIS — R140 Abdominal distension (gaseous): Secondary | ICD-10-CM | POA: Diagnosis not present

## 2016-09-21 DIAGNOSIS — K5903 Drug induced constipation: Secondary | ICD-10-CM

## 2016-09-21 DIAGNOSIS — S3992XD Unspecified injury of lower back, subsequent encounter: Secondary | ICD-10-CM | POA: Diagnosis present

## 2016-09-21 DIAGNOSIS — W19XXXD Unspecified fall, subsequent encounter: Secondary | ICD-10-CM | POA: Diagnosis not present

## 2016-09-21 DIAGNOSIS — Z87891 Personal history of nicotine dependence: Secondary | ICD-10-CM | POA: Diagnosis not present

## 2016-09-21 DIAGNOSIS — S32010D Wedge compression fracture of first lumbar vertebra, subsequent encounter for fracture with routine healing: Secondary | ICD-10-CM

## 2016-09-21 DIAGNOSIS — E119 Type 2 diabetes mellitus without complications: Secondary | ICD-10-CM | POA: Diagnosis not present

## 2016-09-21 DIAGNOSIS — Z79899 Other long term (current) drug therapy: Secondary | ICD-10-CM | POA: Diagnosis not present

## 2016-09-21 MED ORDER — FLEET ENEMA 7-19 GM/118ML RE ENEM: 1.0000 | ENEMA | Freq: Once | RECTAL | 0 refills | Status: AC

## 2016-09-21 MED ORDER — OXYCODONE HCL 5 MG PO TABS
5.0000 mg | ORAL_TABLET | Freq: Once | ORAL | Status: AC
  Administered 2016-09-21: 5 mg via ORAL
  Filled 2016-09-21: qty 1

## 2016-09-21 MED ORDER — POLYETHYLENE GLYCOL 3350 17 G PO PACK: 17.0000 g | PACK | Freq: Every day | ORAL | 0 refills | Status: DC

## 2016-09-21 MED ORDER — OXYCODONE HCL 5 MG PO CAPS: 5.0000 mg | ORAL_CAPSULE | Freq: Four times a day (QID) | ORAL | 0 refills | Status: DC | PRN

## 2016-09-21 NOTE — ED Provider Notes (Signed)
[facility] DEPT Provider Note   CSN: [id number] Arrival date & time: 09/21/16  0911     History   Chief Complaint Chief Complaint  Patient presents with  . Constipation    HPI James Sweeney is a 75 y.o. male.  Patient is 75 yo M with PMH as listed below, evaluated in ED on 11/16 s/p fall in which he sustained L1 compression fracture and was prescribed Norco for pain relief, now presenting with worsening pain and constipation. Patient states he has not had a bowel movement since Thursday despite taking prescription colace and trying prune juice. He describes abdominal pressure and fullness, but no abdominal pain, nausea, or vomiting. He also has had moderate back pain, not relieved by Norco, but denies any saddle anesthesia, numbness, weakness, urinary retention, incontinence. He has not scheduled f/u appointment with PCP or neurosurgery for reevaluation of compression fracture.      Past Medical History:  Diagnosis Date  . Cervical spondylosis without myelopathy 12/01/2013  . Diabetes mellitus    borderline  . Enlarged prostate   . Hearing deficit   . Heart murmur   . History of kidney stones    removed with Lithotripsy  . HOH (hard of hearing)    right ear  . Hyperlipemia   . Hypertension   . Lumbago 12/01/2013  . Pneumonia 2016  . Polio    right leg smaller than left  . Shortness of breath dyspnea    with exertion    Patient Active Problem List   Diagnosis Date Noted  . Pneumothorax 01/25/2016  . Pneumothorax after biopsy 01/25/2016  . Coronary artery calcification seen on CT scan 01/21/2016  . Murmur, cardiac 01/21/2016  . Hyperlipidemia 01/21/2016  . Essential hypertension 01/09/2016  . BPH (benign prostatic hyperplasia) 01/09/2016  . Solitary pulmonary nodule 01/09/2016  . Thrombocytopenia ([place]) 01/09/2016  . Cervical spondylosis without myelopathy 12/01/2013  . Lumbago 12/01/2013  . Diabetes mellitus     Past Surgical History:  Procedure  Laterality Date  . CATARACT EXTRACTION Bilateral 2014  . VIDEO BRONCHOSCOPY WITH ENDOBRONCHIAL NAVIGATION N/A 01/25/2016   Procedure: VIDEO BRONCHOSCOPY WITH ENDOBRONCHIAL NAVIGATION;  Surgeon: [name], MD;  Location: [facility];  Service: Thoracic;  Laterality: N/A;  . VIDEO BRONCHOSCOPY WITH ENDOBRONCHIAL ULTRASOUND N/A 01/25/2016   Procedure: VIDEO BRONCHOSCOPY WITH ENDOBRONCHIAL ULTRASOUND;  Surgeon: [name], MD;  Location: [facility];  Service: Thoracic;  Laterality: N/A;       Home Medications    Prior to Admission medications   Medication Sig Start Date End Date Taking? Authorizing Provider  acetaminophen (TYLENOL) 325 MG tablet Take 2 tablets (650 mg total) by mouth every 6 (six) hours as needed for mild pain or fever. 01/28/16   Erin R Barrett, PA-C  alfuzosin (UROXATRAL) 10 MG 24 hr tablet Take 10 mg by mouth daily.    Historical Provider, MD  amLODipine (NORVASC) 10 MG tablet Take 10 mg by mouth daily.    Historical Provider, MD  Ascorbic Acid (VITAMIN C) 100 MG tablet Take 1,000 mg by mouth daily.     Historical Provider, MD  aspirin EC 81 MG EC tablet Take 1 tablet (81 mg total) by mouth daily. 01/28/16   Erin R Barrett, PA-C  Cyanocobalamin (VITAMIN B-12) 5000 MCG SUBL Place 1 tablet under the tongue daily.    Historical Provider, MD  finasteride (PROSCAR) 5 MG tablet Take 5 mg by mouth daily. 05/16/15   Historical Provider, MD  HYDROcodone-acetaminophen (NORCO/VICODIN) 5-325 MG  tablet Take 1 tablet by mouth every 6 (six) hours as needed. 09/19/16   Roxanna Mew, PA-C  lisinopril-hydrochlorothiazide (PRINZIDE,ZESTORETIC) 20-25 MG per tablet Take 1 tablet by mouth daily. 03/12/15   Historical Provider, MD  Omega-3 Fatty Acids (FISH OIL) 1000 MG CAPS Take 1,000 mg by mouth daily.    Historical Provider, MD  Propylene Glycol (SYSTANE BALANCE) 0.6 % SOLN Place 1 drop into both eyes every other day. Every other night before bed    Historical Provider, MD  simvastatin  (ZOCOR) 20 MG tablet Take 20 mg by mouth every evening.    Historical Provider, MD  traMADol (ULTRAM) 50 MG tablet Take 1 tablet (50 mg total) by mouth every 6 (six) hours as needed. 01/28/16   Erin R Barrett, PA-C  vitamin E 400 UNIT capsule Take 400 Units by mouth daily.    Historical Provider, MD    Family History Family History  Problem Relation Age of Onset  . Cancer Mother     Stomach  . Cancer - Lung Father   . Heart attack Brother   . Diabetes Brother     Social History Social History  Substance Use Topics  . Smoking status: Former Smoker    Packs/day: 0.50    Years: 9.00    Types: Cigarettes    Quit date: 12/02/1967  . Smokeless tobacco: Current User    Types: Chew  . Alcohol use No     Allergies   Levaquin [levofloxacin in d5w] and Penicillins   Review of Systems Review of Systems  Gastrointestinal: Positive for abdominal distention and constipation. Negative for abdominal pain, nausea, rectal pain and vomiting.  Genitourinary: Negative for difficulty urinating and hematuria.  Musculoskeletal: Positive for back pain. Negative for gait problem and neck pain.  Skin: Negative for color change and wound.  Neurological: Negative for dizziness, seizures, syncope, weakness, numbness and headaches.     Physical Exam Updated Vital Signs BP (!) 144/54 (BP Location: Left Arm)   Pulse 62   Temp 98.4 F (36.9 C) (Oral)   Resp 17   SpO2 96%   Physical Exam  Constitutional: He appears well-developed and well-nourished. No distress.  HENT:  Head: Normocephalic and atraumatic.  Mouth/Throat: Oropharynx is clear and moist.  Eyes: Conjunctivae are normal.  Neck: Normal range of motion.  Cardiovascular: Normal rate, regular rhythm, normal heart sounds and intact distal pulses.   Pulmonary/Chest: Effort normal and breath sounds normal. No respiratory distress.  Abdominal: Soft. Bowel sounds are normal. He exhibits distension. There is no tenderness. There is no  guarding.  Genitourinary:  Genitourinary Comments: Chaperone present. No gross blood noted on rectal exam. Normal tone, no tenderness, mass, or fissure. No fecal impaction noted.  Musculoskeletal: Normal range of motion. He exhibits no edema or tenderness.  FROM L-spine and T-spine but limited due to pain.  Neurological: He is alert.  Skin: Skin is warm and dry.  Psychiatric: He has a normal mood and affect.  Nursing note and vitals reviewed.    ED Treatments / Results  Labs (all labs ordered are listed, but only abnormal results are displayed) Labs Reviewed - No data to display  EKG  EKG Interpretation None       Radiology Dg Abdomen Acute W/chest  Result Date: 09/21/2016 CLINICAL DATA:  Lower back pain and constipation status post fall several days ago. EXAM: DG ABDOMEN ACUTE W/ 1V CHEST COMPARISON:  February 27, 2016 FINDINGS: The known nodule in the right middle lobe is  again identified. This was better assessed on the CT scan from September 18, 2016. Vascular crowding in the medial right lung base persists. No pneumothorax. The cardiomediastinal silhouette is stable. No other abnormalities in the chest. No free air, portal venous gas, or pneumatosis. Moderate fecal loading in the right colon. No bowel obstruction identified. Air-filled mildly prominent sigmoid colon is unchanged and was noted to not be obstructed on the previous study. The patient has a known L1 compression fracture which is not as well evaluated on today's study but is not grossly changed. IMPRESSION: 1. Known L1 compression fracture with minimal loss of height, not as well evaluated today, but not changed based on this study. 2. Known right middle lobe pulmonary nodule. See the CT scan from September 18, 2016 for complete description and recommendations. 3. Moderate fecal loading in the ascending colon. Electronically Signed   By: Dorise Bullion III M.D   On: 09/21/2016 12:39    Procedures Procedures (including  critical care time)  Medications Ordered in ED Medications - No data to display   Initial Impression / Assessment and Plan / ED Course  I have reviewed the triage vital signs and the nursing notes.  Pertinent labs & imaging results that were available during my care of the patient were reviewed by me and considered in my medical decision making (see chart for details).  Clinical Course    Patient is 75 yo M presenting with worsening back pain and constipation after sustaining L1 compression fracture during fall on Thursday. Constipation likely attributed to prescription Norco despite taking stool softener, and he denies any symptoms concerning for cauda equina syndrome. No fecal impaction noted on rectal exam. Abdominal x-rays shows moderate fecal load in ascending colon. Despite likely opioid induced constipation, patient was in significant pain, and Oxy IR given for pain relief. On reassessment, he appeared more comfortable and ambulated without difficulty. Stable for d/c home with prescription for Miralax and Fleet enema, as well as short supply of Oxy IR for pain control. Also advised to increase fiber in diet and drink plenty of fluids. Referral provided to Neurosurgery for consultation on back pain and L1 compression fracture. Return precautions discussed for new or worsening symptoms.  Final Clinical Impressions(s) / ED Diagnoses   Final diagnoses:  Drug-induced constipation  Closed compression fracture of L1 lumbar vertebra with routine healing, subsequent encounter    New Prescriptions Discharge Medication List as of 09/21/2016  1:43 PM    START taking these medications   Details  oxycodone (OXY-IR) 5 MG capsule Take 1 capsule (5 mg total) by mouth every 6 (six) hours as needed., Starting Sun 09/21/2016, Print    polyethylene glycol (MIRALAX / GLYCOLAX) packet Take 17 g by mouth daily., Starting Sun 09/21/2016, Print    sodium phosphate (FLEET) 7-19 GM/118ML ENEM Place 133 mLs  (1 enema total) rectally once., Starting Sun 09/21/2016, Print         Endre Coutts F de [place], [place] 09/21/16 2002    Margette Fast, MD 09/22/16 [phone]

## 2016-09-21 NOTE — Discharge Instructions (Signed)
Please take the Fleet enema and Miralax daily as directed for relief of your constipation.

## 2016-09-21 NOTE — ED Triage Notes (Signed)
Patient states that he has been taking the pain medication since injury on Thursday and now constipated. Has been using softner with no relief, complains of rectal pressure

## 2016-09-21 NOTE — ED Notes (Signed)
ED Provider at bedside. 

## 2016-11-03 HISTORY — PX: LUNG BIOPSY: SHX232

## 2016-11-12 ENCOUNTER — Telehealth: Payer: Self-pay | Admitting: *Deleted

## 2016-12-24 ENCOUNTER — Other Ambulatory Visit: Payer: Self-pay | Admitting: *Deleted

## 2016-12-24 ENCOUNTER — Ambulatory Visit (INDEPENDENT_AMBULATORY_CARE_PROVIDER_SITE_OTHER): Payer: Medicare Other | Admitting: Cardiothoracic Surgery

## 2016-12-24 ENCOUNTER — Encounter: Payer: Self-pay | Admitting: Cardiothoracic Surgery

## 2016-12-24 VITALS — BP 137/60 | HR 60 | Resp 16 | Ht 70.0 in | Wt 193.0 lb

## 2016-12-24 DIAGNOSIS — E278 Other specified disorders of adrenal gland: Secondary | ICD-10-CM

## 2016-12-24 DIAGNOSIS — Z8709 Personal history of other diseases of the respiratory system: Secondary | ICD-10-CM

## 2016-12-24 DIAGNOSIS — R911 Solitary pulmonary nodule: Secondary | ICD-10-CM

## 2016-12-24 DIAGNOSIS — D381 Neoplasm of uncertain behavior of trachea, bronchus and lung: Secondary | ICD-10-CM

## 2016-12-24 DIAGNOSIS — Z9889 Other specified postprocedural states: Secondary | ICD-10-CM

## 2016-12-24 NOTE — Progress Notes (Signed)
[address]       [place] [address]             [phone]                    [facility] Medical Record [id number] Date of Birth: 02/21/1941  Referring: Antony Contras, MD Primary Care: Gara Kroner, MD  Chief Complaint:    01/25/2016 OPERATIVE REPORT PREOPERATIVE DIAGNOSES: Right middle lobe lung mass and mediastinal adenopathy, hypermetabolic and enlarged right adrenal gland, hypermetabolic. POSTOPERATIVE DIAGNOSES: Right middle lobe lung mass and mediastinal adenopathy, hypermetabolic and enlarged right adrenal gland, hypermetabolic. PROCEDURE: Video bronchoscopy with navigation bronchoscopy to the right middle lobe lesion and EBUS with transbronchial biopsies of #7 and #4 R lymph nodes. SURGEON: Lanelle Bal, MD PATH: Diagnosis Lung, biopsy, right middle lobe - BENIGN LUNG TISSUE. - SEE COMMENT. Microscopic Comment Sections demonstrate degenerated benign lung tissue with scattered predominantly chronic inflammation. As sampled, there is a no atypia and mass lesional tissue is not identified. The findings are nonspecific. Please correlation with clinical and radiologic impression. (RAH:gt, 01/29/16) [name] Pathologist, Electronic Signature (Case signed 01/29/2016)   History of Present Illness:    James Sweeney 76 y.o. male is seen in the office  today for enlarging right lung nodule , present on CT of abdomen in June ,2016. Patient quit smoking 40-45 years ago. Repeat ct of chest showed right lung nodule grew 78mm. PET ordered . 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 In late March 2017  attempt at navigation bronchoscopy ebus was performed,no definitive pathologic diagnosis was made. The patient did have complication with right pneumothorax requiring chest tube placement.    After the negative biopsy it was recommended recommended to the patient that we proceed with resection of the  lesion in the right middle lobe. However after much discussion he canceled his surgery and has not returned until today. He did have a repeat CT scan in November 2017, done at the current emergency room after he fell out of a tree working on a deer stand. We were not notified the scan had been done and the patient was given no information about the lung nodule present on that scan.  Comes in today at the urging of his primary care doctor for reevaluation.  Current Activity/ Functional Status:  Patient is independent with mobility/ambulation, transfers, ADL's, IADL's.   Zubrod Score: At the time of surgery this patient's most appropriate activity status/level should be described as: []     0    Normal activity, no symptoms [x]     1    Restricted in physical strenuous activity but ambulatory, able to do out light work []     2    Ambulatory and capable of self care, unable to do work activities, up and about               >50 % of waking hours                              []     3    Only limited self care, in bed greater than 50% of waking hours []     4    Completely disabled, no self care, confined to bed or chair []     5    Moribund   Past Medical History:  Diagnosis Date  .  Cervical spondylosis without myelopathy 12/01/2013  . Diabetes mellitus    borderline  . Enlarged prostate   . Hearing deficit   . Heart murmur   . History of kidney stones    removed with Lithotripsy  . HOH (hard of hearing)    right ear  . Hyperlipemia   . Hypertension   . Lumbago 12/01/2013  . Pneumonia 2016  . Polio    right leg smaller than left  . Shortness of breath dyspnea    with exertion    Past Surgical History:  Procedure Laterality Date  . CATARACT EXTRACTION Bilateral 2014  . VIDEO BRONCHOSCOPY WITH ENDOBRONCHIAL NAVIGATION N/A 01/25/2016   Procedure: VIDEO BRONCHOSCOPY WITH ENDOBRONCHIAL NAVIGATION;  Surgeon: [name], MD;  Location: [facility];  Service: Thoracic;  Laterality: N/A;  .  VIDEO BRONCHOSCOPY WITH ENDOBRONCHIAL ULTRASOUND N/A 01/25/2016   Procedure: VIDEO BRONCHOSCOPY WITH ENDOBRONCHIAL ULTRASOUND;  Surgeon: [name], MD;  Location: [facility];  Service: Thoracic;  Laterality: N/A;    Family History  Problem Relation Age of Onset  . Cancer Mother     Stomach  . Cancer - Lung Father   . Heart attack Brother   . Diabetes Brother     Social History   Social History  . Marital status: Married    Spouse name: N/A  . Number of children: 2  . Years of education: N/A   Occupational History  . Not on file.   Social History Main Topics  . Smoking status: Former Smoker    Packs/day: 0.50    Years: 9.00    Types: Cigarettes    Quit date: 12/02/1967  . Smokeless tobacco: Current User    Types: Chew  . Alcohol use No  . Drug use: No  . Sexual activity: No   Other Topics Concern  . Not on file   Social History Narrative   Patient is right handed.   Patient drinks 1 cup caffeine daily.      Pt has been married for 22 years and has 2 children, 4 grandchildren, and 3 great-grandchildren. Lives with everyone except great-grandchildren. He does not clean house, but can shop, do yard work, and drive. 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         Epworth Sleepiness Scale Score:  8      --I have HTN   --I seem to be losing my sex drive   --I wake up to urinate frequently at night   --I awake feeling not rested   --I have borderline diabetes    History  Smoking Status  . Former Smoker  . Packs/day: 0.50  . Years: 9.00  . Types: Cigarettes  . Quit date: 12/02/1967  Smokeless Tobacco  . Current User  . Types: Chew    History  Alcohol Use No     Allergies  Allergen Reactions  . Levaquin [Levofloxacin In D5w] Nausea Only  . Penicillins Hives and Other (See Comments)    Has patient had a PCN reaction causing immediate rash, facial/tongue/throat swelling, SOB or lightheadedness with hypotension: unknown, childhood  allergy Has patient had a PCN reaction causing severe rash involving mucus membranes or skin necrosis: No Has patient had a PCN reaction that required hospitalization No Has patient had a PCN reaction occurring within the last 10 years: No If all of the above answers are "NO", then may proceed with Cephalosporin use.     Current Outpatient Prescriptions  Medication  Sig Dispense Refill  . alfuzosin (UROXATRAL) 10 MG 24 hr tablet Take 10 mg by mouth daily.    [name] amLODipine (NORVASC) 10 MG tablet Take 10 mg by mouth daily.    . Ascorbic Acid (VITAMIN C) 100 MG tablet Take 1,000 mg by mouth daily.     [name] aspirin EC 81 MG EC tablet Take 1 tablet (81 mg total) by mouth daily.    . Cyanocobalamin (VITAMIN B-12) 5000 MCG SUBL Place 1 tablet under the tongue daily.    . finasteride (PROSCAR) 5 MG tablet Take 5 mg by mouth daily.    [name] lisinopril-hydrochlorothiazide (PRINZIDE,ZESTORETIC) 20-25 MG per tablet Take 1 tablet by mouth daily.  1  . Omega-3 Fatty Acids (FISH OIL) 1000 MG CAPS Take 1,000 mg by mouth daily.    . polyethylene glycol (MIRALAX / GLYCOLAX) packet Take 17 g by mouth daily. 7 each 0  . simvastatin (ZOCOR) 20 MG tablet Take 20 mg by mouth every evening.    . traMADol (ULTRAM) 50 MG tablet Take 1 tablet (50 mg total) by mouth every 6 (six) hours as needed. (Patient taking differently: Take 50 mg by mouth every 6 (six) hours as needed for moderate pain. ) 30 tablet 0  . vitamin E 400 UNIT capsule Take 400 Units by mouth daily.     No current facility-administered medications for this visit.       Review of Systems:     Cardiac Review of Systems: Y or N  Chest Pain [   n ]  Resting SOB [n   ] Exertional SOB  [ y ]  Orthopnea [ n ]   Pedal Edema [ n  ]    Palpitations [ n ] Syncope  [n]   Presyncope [n   ]  General Review of Systems: [Y] = yes [  ]=no Constitional: recent weight change [  ];  Wt loss over the last 3 months [   ] anorexia [  ]; fatigue [  ]; nausea [  ]; night  sweats [  ]; fever [  ]; or chills [  ];          Dental: poor dentition[  ]; Last Dentist visit:   Eye : blurred vision [  ]; diplopia [   ]; vision changes [  ];  Amaurosis fugax[  ]; Resp: cough [  ];  wheezing[ n ];  hemoptysis[n  ]; shortness of breath[  ]; paroxysmal nocturnal dyspnea[  ]; dyspnea on exertion[ y ]; or orthopnea[  ];  GI:  gallstones[  ], vomiting[  ];  dysphagia[  ]; melena[  ];  hematochezia [  ]; heartburn[  ];   Hx of  Colonoscopy[  ]; GU: kidney stones [  ]; hematuria[  ];   dysuria [  ];  nocturia[  ];  history of     obstruction [  ]; urinary frequency [  ]             Skin: rash, swelling[  ];, hair loss[  ];  peripheral edema[  ];  or itching[  ]; Musculosketetal: myalgias[  ];  joint swelling[  ];  joint erythema[  ];  joint pain[  ];  back pain[  ];  Heme/Lymph: bruising[  ];  bleeding[  ];  anemia[  ];  Neuro: TIA[ n ];  headaches[  ];  stroke[  ];  vertigo[  ];  seizures[  ];   paresthesias[  ];  difficulty walking[  ];  Psych:depression[  ]; anxiety[  ];  Endocrine: diabetes[  ];  thyroid dysfunction[  ];  Immunizations: Flu up to date [  ]; Pneumococcal up to date [  ];  Other:  Physical Exam: BP 137/60 (BP Location: Right Arm, Patient Position: Sitting, Cuff Size: Large)   Pulse 60   Resp 16   Ht 5\' 10"  (1.778 m)   Wt 193 lb (87.5 kg)   SpO2 95% Comment: ON RA  BMI 27.69 kg/m   PHYSICAL EXAMINATION: General appearance: alert, cooperative and appears older than stated age Head: Normocephalic, without obvious abnormality, atraumatic Neck: no adenopathy, no carotid bruit, no JVD, supple, symmetrical, trachea midline and thyroid not enlarged, symmetric, no tenderness/mass/nodules Lymph nodes: Cervical, supraclavicular, and axillary nodes normal. Resp: clear to auscultation bilaterally Back: symmetric, no curvature. ROM normal. No CVA tenderness. Cardio: regular rate and rhythm, S1, S2 normal, no murmur, click, rub or gallop GI: soft, non-tender;  bowel sounds normal; no masses,  no organomegaly Extremities: extremities normal, atraumatic, no cyanosis or edema and Homans sign is negative, no sign of DVT Neurologic: Grossly normal  Diagnostic Studies & Laboratory data:     Recent Radiology Findings:  CLINICAL DATA:  Status post fall 12 feet from deer stand onto back. Patient on blood thinners. Lower back pain, acute onset. Initial encounter.  EXAM: CT CHEST, ABDOMEN, AND PELVIS WITH CONTRAST  TECHNIQUE: Multidetector CT imaging of the chest, abdomen and pelvis was performed following the standard protocol during bolus administration of intravenous contrast.  CONTRAST:  165mL ISOVUE-300 IOPAMIDOL (ISOVUE-300) INJECTION 61%  COMPARISON:  CT of the abdomen and pelvis from 02/29/2016, and CT of the chest performed 04/17/2016  FINDINGS: CT CHEST FINDINGS  Cardiovascular: Diffuse coronary artery calcifications are seen. The heart is borderline normal in size. Scattered calcification is noted along the thoracic aorta. The great vessels are grossly unremarkable in appearance. There is no evidence of aortic injury. No venous hemorrhage is seen.  Mediastinum/Nodes: A 1.2 cm node is noted at the subcarinal region. A mildly calcified 8 mm node is noted in the periaortic region. No pericardial effusion is identified. The visualized portions of the thyroid gland are unremarkable. No axillary lymphadenopathy is seen.  Lungs/Pleura: Mild bibasilar atelectasis is noted. New peripheral opacity at the right upper lobe could reflect a mild infectious or inflammatory process. A 1.7 x 1.4 cm nodule is noted at the right middle lobe, mildly increased in size from the prior study and suspicious for primary bronchogenic malignancy. No pleural effusion or pneumothorax is seen. There is no evidence of pulmonary parenchymal contusion.  Musculoskeletal: No acute osseous abnormalities are identified. Minimal nonspecific foci of high  density are noted along the right fourth anterolateral rib, likely benign in nature. The visualized musculature is unremarkable in appearance.  CT ABDOMEN PELVIS FINDINGS  Hepatobiliary: Scattered nonspecific hypodensities are noted within the liver, measuring up to 1.6 cm in size. The gallbladder is unremarkable in appearance. The common bile duct remains normal in caliber.  Pancreas: The pancreas is unremarkable in appearance.  Spleen: The spleen is within normal limits.  Adrenals/Urinary Tract: A 3.5 x 3.0 cm right adrenal lesion is noted, with centrally decreased attenuation. The left adrenal gland is unremarkable.  Prominent bilateral renal parapelvic cysts are noted. Nonspecific perinephric stranding is noted bilaterally. There is no evidence of hydronephrosis. No renal or ureteral stones are identified.  Stomach/Bowel: The stomach is unremarkable in appearance. The small bowel is within normal limits. The appendix  is normal in caliber, without evidence of appendicitis. The colon is unremarkable in appearance.  Vascular/Lymphatic: Diffuse calcification is seen along the abdominal aorta and its branches. The abdominal aorta is otherwise grossly unremarkable. The inferior vena cava is grossly unremarkable. No retroperitoneal lymphadenopathy is seen. No pelvic sidewall lymphadenopathy is identified.  Reproductive: The bladder is mildly distended and grossly unremarkable. The prostate is enlarged, measuring 5.5 cm in transverse dimension, with impression on the base of the bladder.  Other: No free air or free fluid is seen within the abdomen or pelvis. There is no evidence of solid or hollow organ injury.  Musculoskeletal: There is a mild compression fracture involving the anterior superior endplate of L1, with only minimal loss of height. There is no evidence of retropulsion or extension to the posterior elements. The visualized musculature is unremarkable in  appearance.  IMPRESSION: 1. Mild compression fracture involving the anterior superior endplate of L1, with only minimal loss of height. No evidence of retropulsion or extension to the posterior elements. 2. No additional evidence for traumatic injury to the chest, abdomen or pelvis. 3. 1.7 x 1.4 cm nodule at the right middle lung lobe has mildly increased in size from the prior study and is suspicious for enlarging primary bronchogenic carcinoma. Tissue diagnosis is recommended. 4. New peripheral opacity in the right upper lung lobe could reflect a mild infectious or inflammatory process. 5. 1.2 cm subcarinal node noted, of uncertain significance. 6. Diffuse coronary artery calcifications seen. 7. Small bibasilar atelectasis noted. 8. 3.5 x 3.0 cm right adrenal lesion noted, with centrally decreased attenuation. This has increased in size from prior studies, and malignancy cannot be excluded. Would correlate with adrenal lab values. Adrenal protocol MRI or CT is recommended for further evaluation. 9. Scattered nonspecific hypodensities within the liver, measuring up to 1.6 cm in size. 10. Diffuse aortic atherosclerosis. 11. Enlarged prostate noted, with impression on the base of the bladder. Would correlate with PSA. 12. Prominent bilateral renal parapelvic cysts noted. These results were called by telephone at the time of interpretation on 09/18/2016 at 9:34 pm to the ER clinical team, who verbally acknowledged these results.   Electronically Signed   By: Garald Balding M.D.   On: 09/18/2016 21:42  Ct Chest Wo Contrast  04/17/2016  CLINICAL DATA:  Followup lung nodule EXAM: CT CHEST WITHOUT CONTRAST TECHNIQUE: Multidetector CT imaging of the chest was performed following the standard protocol without IV contrast. COMPARISON:  12/12/2015 FINDINGS: Mediastinum/Lymph Nodes: Calcified mediastinal lymph nodes are again noted and appears similar to previous exam. Aortic  atherosclerosis. Calcification within the RCA, LAD coronary artery. The trachea appears patent and is midline. Normal appearance of the esophagus. Lungs/Pleura: No pulmonary mass, infiltrate, or effusion. Pulmonary nodule in the right middle lobe measures 1.6 x 1.1 cm, image 84 of series 4. Previously this measured 1.2 x 1.0 cm peer on PET-CT from 01/01/2016 this exhibited malignant range FDG uptake. Calcified granuloma noted within the posterior right upper lobe. Upper abdomen: No acute findings. Stable appearance of right adrenal nodule measuring 2.5 cm, image 131 of series 3. Similar appearance of liver cysts. No acute findings identified within the upper abdomen. Musculoskeletal: Spondylosis noted within the thoracic spine. No aggressive lytic or sclerotic bone lesions identified. IMPRESSION: 1. Increase in size of right middle lobe lung nodule. This remains suspicious for primary bronchogenic carcinoma. 2. Similar appearance of calcified mediastinal nodes. 3. No change in 2.4 cm right adrenal nodule. 4. Aortic atherosclerosis and multi vessel coronary artery  calcification. Electronically Signed   By: Kerby Moors M.D.   On: 04/17/2016 10:12    Nm Pet Image Initial (pi) Skull Base To Thigh  01/01/2016  CLINICAL DATA:  Initial treatment strategy for pulmonary nodule. EXAM: NUCLEAR MEDICINE PET SKULL BASE TO THIGH TECHNIQUE: 9.83 mCi F-18 FDG was injected intravenously. Full-ring PET imaging was performed from the skull base to thigh after the radiotracer. CT data was obtained and used for attenuation correction and anatomic localization. FASTING BLOOD GLUCOSE:  Value: 118 mg/dl COMPARISON:  CT chest dated 12/12/2015. CT abdomen pelvis dated 04/16/2015. FINDINGS: NECK No hypermetabolic lymph nodes in the neck. CHEST 12 x 10 mm right middle lobe nodule (series 6/ image 49), max SUV 2.7, suspicious for primary bronchogenic neoplasm. Mild subpleural nodularity in the posterior right upper lobe (series 6/image  19). No focal consolidation. No pleural effusion or pneumothorax. The heart is normal in size. Coronary atherosclerosis in the LAD and right coronary artery. Atherosclerotic calcifications aortic arch. Thoracic lymphadenopathy, including: --10 mm short axis AP window node (series 4/image 66), max SUV 5.7 --11 mm short axis partially calcified prevascular node (series 4/image 68) --12 mm short axis right hilar node (series 4/image 70), max SUV 6.5 --13 mm short axis partially calcified subcarinal node (series 4/image 32), max SUV 6.9 --11 mm short axis left hilar node (series 4/image 32), max SUV 6.6 Visualized right thyroid is notable for an 18 x 10 mm right thyroid nodule (series 4/image 45). ABDOMEN/PELVIS No abnormal hypermetabolic activity within the liver, pancreas, or spleen. 2.1 x 2.4 cm right adrenal nodule, unchanged from prior 2016 CT abdomen pelvis, max SUV 5.9. Left renal sinus cysts. Atherosclerotic calcification of the abdominal aorta and branch vessels. Prostatomegaly, with enlargement of the central gland which indents the base the bladder. Bladder is mildly thick-walled although underdistended. Tiny fat containing bilateral inguinal hernias. No hypermetabolic lymph nodes in the abdomen or pelvis. SKELETON No focal hypermetabolic activity to suggest skeletal metastasis. IMPRESSION: 12 x 10 mm hypermetabolic right middle lobe nodule, suspicious for primary bronchogenic neoplasm. Hypermetabolic thoracic lymphadenopathy, most of which is partially calcified, favored to reflect granulomatous disease such as sarcoidosis. Nodal metastases is considered less likely. 2.1 x 2.4 cm hypermetabolic right adrenal nodule, unchanged from June 2016. While technically indeterminate, metastasis not excluded, stability favors a benign etiology such as an adrenal adenoma. Electronically Signed   By: Julian Hy M.D.   On: 01/01/2016 10:47  I have independently reviewed the above radiology studies  and reviewed  the findings with the patient.  Recent Lab Findings: Lab Results  Component Value Date   WBC 5.5 09/18/2016   HGB 11.6 (L) 09/18/2016   HCT 34.0 (L) 09/18/2016   PLT 127 (L) 09/18/2016   GLUCOSE 113 (H) 09/18/2016   ALT 23 01/25/2016   AST 18 01/25/2016   NA 139 09/18/2016   K 3.8 09/18/2016   CL 103 09/18/2016   CREATININE 1.20 09/18/2016   BUN 24 (H) 09/18/2016   CO2 27 01/26/2016   INR 1.07 01/25/2016   Chronic Kidney Disease   Stage I     GFR >90  Stage II    GFR 60-89  Stage IIIA GFR 45-59  Stage IIIB GFR 30-44  Stage IV   GFR 15-29  Stage V    GFR  <15  Lab Results  Component Value Date   CREATININE 1.20 09/18/2016   CrCl cannot be calculated (Patient's most recent lab result is older than the maximum 21 days allowed.).  PFT's: FEV1 3.07 100% Dlco 25  79%  Conclusions: The diffusion defect is consistent with a pulmonary vascular process. Anemia cannot be excluded as a potential cause of the diffusion defect without correcting the observed diffusing capacity for hemoglobin. Pulmonary Function Diagnosis: Minimal Diffusion Defect -Pulmonary Vascular  ECHO: LV EF: 65% - 70%  ------------------------------------------------------------------- History: PMH: Dyspnea. Risk factors: Smokless tabacco user. Former tobacco use. Hypertension. Diabetes mellitus. Dyslipidemia.  ------------------------------------------------------------------- Study Conclusions  - Left ventricle: The cavity size was normal. There was mild  concentric hypertrophy. Systolic function was vigorous. The  estimated ejection fraction was in the range of 65% to 70%. Wall  motion was normal; there were no regional wall motion  abnormalities. Doppler parameters are consistent with abnormal  left ventricular relaxation (grade 1 diastolic dysfunction).  There was no evidence of elevated ventricular filling pressure by  Doppler parameters. - Aortic valve: Trileaflet; normal  thickness leaflets. Valve area  (Vmax): 1.58 cm^2. - Aortic root: The aortic root was normal in size. - Left atrium: The atrium was mildly dilated. - Right ventricle: Systolic function was normal. - Right atrium: The atrium was normal in size. - Tricuspid valve: There was trivial regurgitation. - Pulmonary arteries: Systolic pressure was within the normal  range. - Inferior vena cava: The vessel was normal in size. - Pericardium, extracardiac: There was no pericardial effusion.  Transthoracic echocardiography. M-mode, complete 2D, spectral Doppler, and color Doppler. Birthdate: Patient birthdate: November 10, 1940. Age: Patient is 76 yr old. Sex: Gender: male. BMI: 27.8 kg/m^2. Blood pressure: 126/50 Patient status: Outpatient. Study date: Study date: 01/23/2016. Study time: 10:07 AM.  -------------------------------------------------------------------  ------------------------------------------------------------------- Left ventricle: The cavity size was normal. There was mild concentric hypertrophy. Systolic function was vigorous. The estimated ejection fraction was in the range of 65% to 70%. Wall motion was normal; there were no regional wall motion abnormalities. The transmitral flow pattern was normal. The deceleration time of the early transmitral flow velocity was normal. The pulmonary vein flow pattern was normal. The tissue Doppler parameters were normal. Doppler parameters are consistent with abnormal left ventricular relaxation (grade 1 diastolic dysfunction). There was no evidence of elevated ventricular filling pressure by Doppler parameters.  ------------------------------------------------------------------- Aortic valve: Trileaflet; normal thickness leaflets. Mobility was not restricted. Sclerosis without stenosis. Doppler: Transvalvular velocity was within the normal range. There was no stenosis. There was no regurgitation. Peak velocity  ratio of LVOT to aortic valve: 0.62. Valve area (Vmax): 1.58 cm^2. Indexed valve area (Vmax): 0.75 cm^2/m^2. Peak gradient (S): 9 mm Hg.  ------------------------------------------------------------------- Aorta: Aortic root: The aortic root was normal in size.  ------------------------------------------------------------------- Mitral valve: Structurally normal valve. Mobility was not restricted. Doppler: Transvalvular velocity was within the normal range. There was no evidence for stenosis. There was no regurgitation. Peak gradient (D): 2 mm Hg.  ------------------------------------------------------------------- Left atrium: The atrium was mildly dilated.  ------------------------------------------------------------------- Right ventricle: The cavity size was normal. Wall thickness was normal. Systolic function was normal.  ------------------------------------------------------------------- Pulmonic valve: Doppler: Transvalvular velocity was within the normal range. There was no evidence for stenosis.  ------------------------------------------------------------------- Tricuspid valve: Structurally normal valve. Doppler: Transvalvular velocity was within the normal range. There was trivial regurgitation.  ------------------------------------------------------------------- Pulmonary artery: The main pulmonary artery was normal-sized. Systolic pressure was within the normal range.  ------------------------------------------------------------------- Right atrium: The atrium was normal in size.  ------------------------------------------------------------------- Pericardium: There was no pericardial effusion.  ------------------------------------------------------------------- Systemic veins: Inferior vena cava: The vessel was normal in size.  ------------------------------------------------------------------- Measurements  Left  ventricle Value Reference LV ID,  ED, PLAX chordal 44.7 mm 43 - 52 LV ID, ES, PLAX chordal 28.9 mm 23 - 38 LV fx shortening, PLAX chordal 35 % >=29 LV PW thickness, ED 12.9 mm --------- IVS/LV PW ratio, ED 1.02 <=1.3 Stroke volume, 2D 64 ml --------- Stroke volume/bsa, 2D 30 ml/m^2 --------- LV ejection fraction, 1-p A4C 57 % --------- LV end-diastolic volume, 2-p [id number] ml --------- LV end-systolic volume, 2-p 52 ml --------- LV ejection fraction, 2-p 60 % --------- Stroke volume, 2-p 78 ml --------- LV end-diastolic volume/bsa, 2-p 62 ml/m^2 --------- LV end-systolic volume/bsa, 2-p 25 ml/m^2 --------- Stroke volume/bsa, 2-p 37.1 ml/m^2 --------- LV e&', lateral 8.09 cm/s --------- LV E/e&', lateral 9.38 --------- LV s&', lateral 16 cm/s --------- LV e&', medial 8.29 cm/s --------- LV E/e&', medial 9.16 --------- LV e&', average 8.19 cm/s --------- LV E/e&', average 9.27 ---------  Ventricular septum Value Reference IVS thickness, ED 13.1 mm ---------  LVOT Value Reference LVOT ID, S 18 mm --------- LVOT area 2.54 cm^2 --------- LVOT peak velocity,  S 93.1 cm/s --------- LVOT mean velocity, S 67 cm/s --------- LVOT VTI, S 25.2 cm ---------  Aortic valve Value Reference Aortic valve peak velocity, S 150 cm/s --------- Aortic peak gradient, S 9 mm Hg --------- Velocity ratio, peak, LVOT/AV 0.62 --------- Aortic valve area, peak velocity 1.58 cm^2 --------- Aortic valve area/bsa, peak 0.75 cm^2/m^2 --------- velocity  Aorta Value Reference Aortic root ID, ED 31 mm ---------  Left atrium Value Reference LA ID, A-P, ES 42 mm --------- LA ID/bsa, A-P 2 cm/m^2 <=2.2 LA volume, S 44 ml --------- LA volume/bsa, S 20.9 ml/m^2 --------- LA volume, ES, 1-p A4C 44 ml --------- LA volume/bsa, ES, 1-p A4C 20.9 ml/m^2 --------- LA volume, ES, 1-p A2C 42 ml --------- LA volume/bsa, ES, 1-p A2C 20 ml/m^2 ---------  Mitral valve Value Reference Mitral E-wave peak velocity 75.9 cm/s --------- Mitral A-wave peak velocity 103 cm/s --------- Mitral deceleration time (H) 306 ms 150 - 230 Mitral peak gradient, D 2 mm Hg --------- Mitral E/A ratio, peak 0.8 --------- Mitral maximal regurg velocity, 341 cm/s --------- PISA  Pulmonary arteries Value Reference PA  pressure, S, DP 20 mm Hg <=30  Tricuspid valve Value Reference Tricuspid regurg peak velocity 204 cm/s --------- Tricuspid peak RV-RA gradient 17 mm Hg ---------  Right ventricle Value Reference RV ID, ED, PLAX 30.7 mm 19 - 38  Pulmonic valve Value Reference Pulmonic valve peak velocity, S 80.2 cm/s ---------  Legend: (L) and (H) mark values outside specified reference range.  ------------------------------------------------------------------- Prepared and Electronically Authenticated by  Ena Dawley, M.D. 2017-03-22T12:44:23  Notes Recorded by Sanda Klein, MD on 01/23/2016 at 2:20 PM Both the stress test and the cho are low risk. The aortic valve is sclerotic, but not stenotic. The LVEF is normal. OK to proceed with surgical procedure with Dr. Servando Snare.       Assessment / Plan:   Right middle lobe 1 cm lung nodule enlarging with hypermetabolic right adrenal lesion and hypermetabolic bilateral hilar nodes- without definitive tissue diagnosis. I discussed with the patient and his wife proceeding with surgical resection of the lesion in the right middle lobe. For treatment and DX. Now that right lung lesion has enlarged . I've again discussed with the patient and his wife proceeding with right VATS, lung resection to obtain tissue dx and treat. 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    [name]  MD      [address] [place] [address] Office [phone]   Beeper [phone]  12/25/2016 5:20 PM

## 2016-12-30 ENCOUNTER — Ambulatory Visit
Admission: RE | Admit: 2016-12-30 | Discharge: 2016-12-30 | Disposition: A | Discharge status: Home / Self Care | Payer: Medicare Other | Source: Ambulatory Visit | Attending: Cardiothoracic Surgery | Admitting: Cardiothoracic Surgery

## 2016-12-30 DIAGNOSIS — R911 Solitary pulmonary nodule: Secondary | ICD-10-CM | POA: Diagnosis present

## 2016-12-30 DIAGNOSIS — I700 Atherosclerosis of aorta: Secondary | ICD-10-CM | POA: Diagnosis not present

## 2016-12-30 DIAGNOSIS — E278 Other specified disorders of adrenal gland: Secondary | ICD-10-CM

## 2016-12-30 DIAGNOSIS — N400 Enlarged prostate without lower urinary tract symptoms: Secondary | ICD-10-CM | POA: Insufficient documentation

## 2016-12-30 DIAGNOSIS — D179 Benign lipomatous neoplasm, unspecified: Secondary | ICD-10-CM | POA: Insufficient documentation

## 2016-12-30 DIAGNOSIS — E279 Disorder of adrenal gland, unspecified: Secondary | ICD-10-CM | POA: Diagnosis present

## 2016-12-30 LAB — GLUCOSE, CAPILLARY: Glucose-Capillary: 105 mg/dL — ABNORMAL HIGH (ref 65–99)

## 2016-12-30 MED ORDER — FLUDEOXYGLUCOSE F - 18 (FDG) INJECTION
12.9800 | Freq: Once | INTRAVENOUS | Status: AC | PRN
  Administered 2016-12-30: 12.98 via INTRAVENOUS

## 2016-12-31 ENCOUNTER — Encounter: Payer: Self-pay | Admitting: Cardiothoracic Surgery

## 2016-12-31 ENCOUNTER — Ambulatory Visit (INDEPENDENT_AMBULATORY_CARE_PROVIDER_SITE_OTHER): Payer: Medicare Other | Admitting: Cardiothoracic Surgery

## 2016-12-31 VITALS — BP 145/64 | HR 74 | Resp 20 | Ht 70.0 in | Wt 193.0 lb

## 2016-12-31 DIAGNOSIS — D381 Neoplasm of uncertain behavior of trachea, bronchus and lung: Secondary | ICD-10-CM | POA: Diagnosis not present

## 2016-12-31 DIAGNOSIS — E279 Disorder of adrenal gland, unspecified: Secondary | ICD-10-CM | POA: Diagnosis not present

## 2016-12-31 NOTE — Progress Notes (Signed)
[address]       [place] [address]             [phone]                    James Sweeney [place] Medical Record [id number] Date of Birth: 11/07/1940  Referring: Antony Contras, MD Primary Care: Gara Kroner, MD  Chief Complaint:    01/25/2016 OPERATIVE REPORT PREOPERATIVE DIAGNOSES: Right middle lobe lung mass and mediastinal adenopathy, hypermetabolic and enlarged right adrenal gland, hypermetabolic. POSTOPERATIVE DIAGNOSES: Right middle lobe lung mass and mediastinal adenopathy, hypermetabolic and enlarged right adrenal gland, hypermetabolic. PROCEDURE: Video bronchoscopy with navigation bronchoscopy to the right middle lobe lesion and EBUS with transbronchial biopsies of #7 and #4 R lymph nodes. SURGEON: Lanelle Bal, MD PATH: Diagnosis Lung, biopsy, right middle lobe - BENIGN LUNG TISSUE. - SEE COMMENT. Microscopic Comment Sections demonstrate degenerated benign lung tissue with scattered predominantly chronic inflammation. As sampled, there is a no atypia and mass lesional tissue is not identified. The findings are nonspecific. Please correlation with clinical and radiologic impression. (RAH:gt, 01/29/16) [name] Pathologist, Electronic Signature (Case signed 01/29/2016)   History of Present Illness:    James Sweeney 76 y.o. male is seen in the office  today for enlarging right lung nodule , present on CT of abdomen in June ,2016. Patient quit smoking 40-45 years ago. Repeat ct of chest showed right lung nodule grew 22mm.  . 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 In late March 2017  attempt at navigation bronchoscopy ebus was performed,no definitive pathologic diagnosis was made. The patient did have complication with right pneumothorax requiring chest tube placement.    After the negative biopsy it was recommended recommended to the patient that we proceed with resection of the lesion in the  right middle lobe. However after much discussion he canceled his surgery and has not returned until today. He did have a repeat CT scan in November 2017, done at the current emergency room after he fell out of a tree working on a deer stand. We were not notified the scan had been done and the patient was given no information about the lung nodule present on that scan.  Comes in today for follow up discussion concerning the results of a repeat PET scan and to decide about proceeding with surgical resection of the right lung mass.  Current Activity/ Functional Status:  Patient is independent with mobility/ambulation, transfers, ADL's, IADL's.   Zubrod Score: At the time of surgery this patient's most appropriate activity status/level should be described as: []     0    Normal activity, no symptoms [x]     1    Restricted in physical strenuous activity but ambulatory, able to do out light work []     2    Ambulatory and capable of self care, unable to do work activities, up and about               >50 % of waking hours                              []     3    Only limited self care, in bed greater than 50% of waking hours []     4    Completely disabled, no self care, confined to bed or chair []   5    Moribund   Past Medical History:  Diagnosis Date  . Cervical spondylosis without myelopathy 12/01/2013  . Diabetes mellitus    borderline  . Enlarged prostate   . Hearing deficit   . Heart murmur   . History of kidney stones    removed with Lithotripsy  . HOH (hard of hearing)    right ear  . Hyperlipemia   . Hypertension   . Lumbago 12/01/2013  . Pneumonia 2016  . Polio    right leg smaller than left  . Shortness of breath dyspnea    with exertion    Past Surgical History:  Procedure Laterality Date  . CATARACT EXTRACTION Bilateral 2014  . VIDEO BRONCHOSCOPY WITH ENDOBRONCHIAL NAVIGATION N/A 01/25/2016   Procedure: VIDEO BRONCHOSCOPY WITH ENDOBRONCHIAL NAVIGATION;  Surgeon: [name], MD;  Location: [facility];  Service: Thoracic;  Laterality: N/A;  . VIDEO BRONCHOSCOPY WITH ENDOBRONCHIAL ULTRASOUND N/A 01/25/2016   Procedure: VIDEO BRONCHOSCOPY WITH ENDOBRONCHIAL ULTRASOUND;  Surgeon: [name], MD;  Location: [facility];  Service: Thoracic;  Laterality: N/A;    Family History  Problem Relation Age of Onset  . Cancer Mother     Stomach  . Cancer - Lung Father   . Heart attack Brother   . Diabetes Brother     Social History   Social History  . Marital status: Married    Spouse name: N/A  . Number of children: 2  . Years of education: N/A   Occupational History  . Not on file.   Social History Main Topics  . Smoking status: Former Smoker    Packs/day: 0.50    Years: 9.00    Types: Cigarettes    Quit date: 12/02/1967  . Smokeless tobacco: Current User    Types: Chew  . Alcohol use No  . Drug use: No  . Sexual activity: No   Other Topics Concern  . Not on file   Social History Narrative   Patient is right handed.   Patient drinks 1 cup caffeine daily.      Pt has been married for 68 years and has 2 children, 4 grandchildren, and 3 great-grandchildren. Lives with everyone except great-grandchildren. He does not clean house, but can shop, do yard work, and drive. 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         Epworth Sleepiness Scale Score:  8      --I have HTN   --I seem to be losing my sex drive   --I wake up to urinate frequently at night   --I awake feeling not rested   --I have borderline diabetes    History  Smoking Status  . Former Smoker  . Packs/day: 0.50  . Years: 9.00  . Types: Cigarettes  . Quit date: 12/02/1967  Smokeless Tobacco  . Current User  . Types: Chew    History  Alcohol Use No     Allergies  Allergen Reactions  . Levaquin [Levofloxacin In D5w] Nausea Only  . Penicillins Hives and Other (See Comments)    Has patient had a PCN reaction causing immediate rash,  facial/tongue/throat swelling, SOB or lightheadedness with hypotension: unknown, childhood allergy Has patient had a PCN reaction causing severe rash involving mucus membranes or skin necrosis: No Has patient had a PCN reaction that required hospitalization No Has patient had a PCN reaction occurring within the last 10 years: No If all of the above answers are "NO",  then may proceed with Cephalosporin use.     Current Outpatient Prescriptions  Medication Sig Dispense Refill  . alfuzosin (UROXATRAL) 10 MG 24 hr tablet Take 10 mg by mouth daily.    [name] amLODipine (NORVASC) 10 MG tablet Take 10 mg by mouth daily.    . Ascorbic Acid (VITAMIN C) 100 MG tablet Take 1,000 mg by mouth daily.     [name] aspirin EC 81 MG EC tablet Take 1 tablet (81 mg total) by mouth daily.    . Cyanocobalamin (VITAMIN B-12) 5000 MCG SUBL Place 1 tablet under the tongue daily.    . finasteride (PROSCAR) 5 MG tablet Take 5 mg by mouth daily.    [name] lisinopril-hydrochlorothiazide (PRINZIDE,ZESTORETIC) 20-25 MG per tablet Take 1 tablet by mouth daily.  1  . Omega-3 Fatty Acids (FISH OIL) 1000 MG CAPS Take 1,000 mg by mouth daily.    . polyethylene glycol (MIRALAX / GLYCOLAX) packet Take 17 g by mouth daily. 7 each 0  . simvastatin (ZOCOR) 20 MG tablet Take 20 mg by mouth every evening.    . traMADol (ULTRAM) 50 MG tablet Take 1 tablet (50 mg total) by mouth every 6 (six) hours as needed. (Patient taking differently: Take 50 mg by mouth every 6 (six) hours as needed for moderate pain. ) 30 tablet 0  . vitamin E 400 UNIT capsule Take 400 Units by mouth daily.     No current facility-administered medications for this visit.       Review of Systems:     Cardiac Review of Systems: Y or N  Chest Pain [   n ]  Resting SOB [n   ] Exertional SOB  [ y ]  Orthopnea [ n ]   Pedal Edema [ n  ]    Palpitations [ n ] Syncope  [n]   Presyncope [n   ]  General Review of Systems: [Y] = yes [  ]=no Constitional: recent weight change [  ];   Wt loss over the last 3 months [   ] anorexia [  ]; fatigue [  ]; nausea [  ]; night sweats [  ]; fever [  ]; or chills [  ];          Dental: poor dentition[  ]; Last Dentist visit:   Eye : blurred vision [  ]; diplopia [   ]; vision changes [  ];  Amaurosis fugax[  ]; Resp: cough [  ];  wheezing[ n ];  hemoptysis[n  ]; shortness of breath[  ]; paroxysmal nocturnal dyspnea[  ]; dyspnea on exertion[ y ]; or orthopnea[  ];  GI:  gallstones[  ], vomiting[  ];  dysphagia[  ]; melena[  ];  hematochezia [  ]; heartburn[  ];   Hx of  Colonoscopy[  ]; GU: kidney stones [  ]; hematuria[  ];   dysuria [  ];  nocturia[  ];  history of     obstruction [  ]; urinary frequency [  ]             Skin: rash, swelling[  ];, hair loss[  ];  peripheral edema[  ];  or itching[  ]; Musculosketetal: myalgias[  ];  joint swelling[  ];  joint erythema[  ];  joint pain[  ];  back pain[  ];  Heme/Lymph: bruising[  ];  bleeding[  ];  anemia[  ];  Neuro: TIA[ n ];  headaches[  ];  stroke[  ];  vertigo[  ];  seizures[  ];   paresthesias[  ];  difficulty walking[  ];  Psych:depression[  ]; anxiety[  ];  Endocrine: diabetes[  ];  thyroid dysfunction[  ];  Immunizations: Flu up to date [  ]; Pneumococcal up to date [  ];  Other:  Physical Exam: BP (!) 145/64   Pulse 74   Resp 20   Ht 5\' 10"  (1.778 m)   Wt 193 lb (87.5 kg)   SpO2 97% Comment: RA  BMI 27.69 kg/m   PHYSICAL EXAMINATION: General appearance: alert, cooperative and appears older than stated age Head: Normocephalic, without obvious abnormality, atraumatic Neck: no adenopathy, no carotid bruit, no JVD, supple, symmetrical, trachea midline and thyroid not enlarged, symmetric, no tenderness/mass/nodules Lymph nodes: Cervical, supraclavicular, and axillary nodes normal. Resp: clear to auscultation bilaterally Back: symmetric, no curvature. ROM normal. No CVA tenderness. Cardio: regular rate and rhythm, S1, S2 normal, no murmur, click, rub or gallop GI: soft,  non-tender; bowel sounds normal; no masses,  no organomegaly Extremities: extremities normal, atraumatic, no cyanosis or edema and Homans sign is negative, no sign of DVT Neurologic: Grossly normal  Diagnostic Studies & Laboratory data:     Recent Radiology Findings:  Nm Pet Image Restag (ps) Skull Base To Thigh  Result Date: 12/30/2016 CLINICAL DATA:  Initial treatment strategy for lung nodule and adrenal mass. EXAM: NUCLEAR MEDICINE PET SKULL BASE TO THIGH TECHNIQUE: 13.0 mCi F-18 FDG was injected intravenously. Full-ring PET imaging was performed from the skull base to thigh after the radiotracer. CT data was obtained and used for attenuation correction and anatomic localization. FASTING BLOOD GLUCOSE:  Value: 105 mg/dl COMPARISON:  Multiple exams, including 09/18/2016 CT scan FINDINGS: NECK No hypermetabolic lymph nodes in the neck. CHEST 1.8 cm right middle lobe pulmonary nodule has maximum standard uptake value 3.4, this has increased in size from 1.2 cm on 01/01/2016 and increased in maximum SUV from prior measurement of 2.7. Faintly accentuated activity in the right hilum is likely from a small hilar node, maximum SUV 5.6. A faintly hypermetabolic subcarinal node 1.4 cm in short axis on image 97/3, maximum SUV 4.7. Su faintly hypermetabolic left infrahilar region, but with maximum SUV of only 4.2. There some calcified mediastinal lymph nodes compatible with old granulomatous disease. Partially calcified 5 mm right upper lobe subpleural nodule on image 78/3. Ill-defined anterior right upper lobe subpleural density on image 93/3, possibly postinflammatory, with only vague metabolic activity. Small lipoma deep to the left serratus anterior muscle without hypermetabolic activity. Coronary, aortic arch, and branch vessel atherosclerotic vascular disease. ABDOMEN/PELVIS The right adrenal nodule measures 3.0 by 2.0 cm on image 154/3 (previously 2.1 by 2.4 cm on 01/01/2016) and has an internal density of  30 Hounsfield units with maximum SUV of 5.2 (Previously 5.9 on prior PET-CT). The contralateral adrenal gland has a maximum SUV of 4.4, for comparison purposes. Enlarged prostate gland without hypermetabolic activity. Hypodense but not hypermetabolic lesions in segment 4 and segment 3 of the liver, likely cysts. Fullness of the left collecting system without hydroureter. Aortoiliac atherosclerotic vascular disease. There is a lipoma deep to the left gluteus maximus muscle which is probably exerting some mass effect on the left sciatic nerve. SKELETON No focal hypermetabolic activity to suggest skeletal metastasis. IMPRESSION: 1. Slow enlargement of the mildly hypermetabolic right middle lobe nodule, suspicious for low-grade malignancy such as bronchial carcinoid tumor. There are also several small faintly hypermetabolic lymph nodes in the chest as noted above. Another possibility  in this case is low grade smoldering granulomatous disease. Tissue diagnosis of the right middle lobe lesion should be considered. 2. Enlarging right adrenal nodule is minimally asymmetrically hypermetabolic, but was previously more hypermetabolic on the prior PET-CT of 2017. This has enlarged mildly since the prior PET-CT, and probably merits surveillance. 3. Other imaging findings of potential clinical significance: Enlarged prostate gland. Coronary, aortic arch, and branch vessel atherosclerotic vascular disease. Lipoma posterior to the left gluteus maximus muscle, displacing the left sciatic nerve anteriorly; there is also a lipoma deep to the left serratus anterior muscle and along the left external oblique muscle. Electronically Signed   By: Van Clines M.D.   On: 12/30/2016 13:52        CLINICAL DATA:  Status post fall 12 feet from deer stand onto back. Patient on blood thinners. Lower back pain, acute onset. Initial encounter.  EXAM: CT CHEST, ABDOMEN, AND PELVIS WITH CONTRAST  TECHNIQUE: Multidetector CT  imaging of the chest, abdomen and pelvis was performed following the standard protocol during bolus administration of intravenous contrast.  CONTRAST:  120mL ISOVUE-300 IOPAMIDOL (ISOVUE-300) INJECTION 61%  COMPARISON:  CT of the abdomen and pelvis from 02/29/2016, and CT of the chest performed 04/17/2016  FINDINGS: CT CHEST FINDINGS  Cardiovascular: Diffuse coronary artery calcifications are seen. The heart is borderline normal in size. Scattered calcification is noted along the thoracic aorta. The great vessels are grossly unremarkable in appearance. There is no evidence of aortic injury. No venous hemorrhage is seen.  Mediastinum/Nodes: A 1.2 cm node is noted at the subcarinal region. A mildly calcified 8 mm node is noted in the periaortic region. No pericardial effusion is identified. The visualized portions of the thyroid gland are unremarkable. No axillary lymphadenopathy is seen.  Lungs/Pleura: Mild bibasilar atelectasis is noted. New peripheral opacity at the right upper lobe could reflect a mild infectious or inflammatory process. A 1.7 x 1.4 cm nodule is noted at the right middle lobe, mildly increased in size from the prior study and suspicious for primary bronchogenic malignancy. No pleural effusion or pneumothorax is seen. There is no evidence of pulmonary parenchymal contusion.  Musculoskeletal: No acute osseous abnormalities are identified. Minimal nonspecific foci of high density are noted along the right fourth anterolateral rib, likely benign in nature. The visualized musculature is unremarkable in appearance.  CT ABDOMEN PELVIS FINDINGS  Hepatobiliary: Scattered nonspecific hypodensities are noted within the liver, measuring up to 1.6 cm in size. The gallbladder is unremarkable in appearance. The common bile duct remains normal in caliber.  Pancreas: The pancreas is unremarkable in appearance.  Spleen: The spleen is within normal  limits.  Adrenals/Urinary Tract: A 3.5 x 3.0 cm right adrenal lesion is noted, with centrally decreased attenuation. The left adrenal gland is unremarkable.  Prominent bilateral renal parapelvic cysts are noted. Nonspecific perinephric stranding is noted bilaterally. There is no evidence of hydronephrosis. No renal or ureteral stones are identified.  Stomach/Bowel: The stomach is unremarkable in appearance. The small bowel is within normal limits. The appendix is normal in caliber, without evidence of appendicitis. The colon is unremarkable in appearance.  Vascular/Lymphatic: Diffuse calcification is seen along the abdominal aorta and its branches. The abdominal aorta is otherwise grossly unremarkable. The inferior vena cava is grossly unremarkable. No retroperitoneal lymphadenopathy is seen. No pelvic sidewall lymphadenopathy is identified.  Reproductive: The bladder is mildly distended and grossly unremarkable. The prostate is enlarged, measuring 5.5 cm in transverse dimension, with impression on the base of the bladder.  Other: No free air or free fluid is seen within the abdomen or pelvis. There is no evidence of solid or hollow organ injury.  Musculoskeletal: There is a mild compression fracture involving the anterior superior endplate of L1, with only minimal loss of height. There is no evidence of retropulsion or extension to the posterior elements. The visualized musculature is unremarkable in appearance.  IMPRESSION: 1. Mild compression fracture involving the anterior superior endplate of L1, with only minimal loss of height. No evidence of retropulsion or extension to the posterior elements. 2. No additional evidence for traumatic injury to the chest, abdomen or pelvis. 3. 1.7 x 1.4 cm nodule at the right middle lung lobe has mildly increased in size from the prior study and is suspicious for enlarging primary bronchogenic carcinoma. Tissue diagnosis  is recommended. 4. New peripheral opacity in the right upper lung lobe could reflect a mild infectious or inflammatory process. 5. 1.2 cm subcarinal node noted, of uncertain significance. 6. Diffuse coronary artery calcifications seen. 7. Small bibasilar atelectasis noted. 8. 3.5 x 3.0 cm right adrenal lesion noted, with centrally decreased attenuation. This has increased in size from prior studies, and malignancy cannot be excluded. Would correlate with adrenal lab values. Adrenal protocol MRI or CT is recommended for further evaluation. 9. Scattered nonspecific hypodensities within the liver, measuring up to 1.6 cm in size. 10. Diffuse aortic atherosclerosis. 11. Enlarged prostate noted, with impression on the base of the bladder. Would correlate with PSA. 12. Prominent bilateral renal parapelvic cysts noted. These results were called by telephone at the time of interpretation on 09/18/2016 at 9:34 pm to the ER clinical team, who verbally acknowledged these results.   Electronically Signed   By: Garald Balding M.D.   On: 09/18/2016 21:42  Ct Chest Wo Contrast  04/17/2016  CLINICAL DATA:  Followup lung nodule EXAM: CT CHEST WITHOUT CONTRAST TECHNIQUE: Multidetector CT imaging of the chest was performed following the standard protocol without IV contrast. COMPARISON:  12/12/2015 FINDINGS: Mediastinum/Lymph Nodes: Calcified mediastinal lymph nodes are again noted and appears similar to previous exam. Aortic atherosclerosis. Calcification within the RCA, LAD coronary artery. The trachea appears patent and is midline. Normal appearance of the esophagus. Lungs/Pleura: No pulmonary mass, infiltrate, or effusion. Pulmonary nodule in the right middle lobe measures 1.6 x 1.1 cm, image 84 of series 4. Previously this measured 1.2 x 1.0 cm peer on PET-CT from 01/01/2016 this exhibited malignant range FDG uptake. Calcified granuloma noted within the posterior right upper lobe. Upper abdomen: No  acute findings. Stable appearance of right adrenal nodule measuring 2.5 cm, image 131 of series 3. Similar appearance of liver cysts. No acute findings identified within the upper abdomen. Musculoskeletal: Spondylosis noted within the thoracic spine. No aggressive lytic or sclerotic bone lesions identified. IMPRESSION: 1. Increase in size of right middle lobe lung nodule. This remains suspicious for primary bronchogenic carcinoma. 2. Similar appearance of calcified mediastinal nodes. 3. No change in 2.4 cm right adrenal nodule. 4. Aortic atherosclerosis and multi vessel coronary artery calcification. Electronically Signed   By: Kerby Moors M.D.   On: 04/17/2016 10:12    Nm Pet Image Initial (pi) Skull Base To Thigh  01/01/2016  CLINICAL DATA:  Initial treatment strategy for pulmonary nodule. EXAM: NUCLEAR MEDICINE PET SKULL BASE TO THIGH TECHNIQUE: 9.83 mCi F-18 FDG was injected intravenously. Full-ring PET imaging was performed from the skull base to thigh after the radiotracer. CT data was obtained and used for attenuation correction and anatomic localization.  FASTING BLOOD GLUCOSE:  Value: 118 mg/dl COMPARISON:  CT chest dated 12/12/2015. CT abdomen pelvis dated 04/16/2015. FINDINGS: NECK No hypermetabolic lymph nodes in the neck. CHEST 12 x 10 mm right middle lobe nodule (series 6/ image 49), max SUV 2.7, suspicious for primary bronchogenic neoplasm. Mild subpleural nodularity in the posterior right upper lobe (series 6/image 19). No focal consolidation. No pleural effusion or pneumothorax. The heart is normal in size. Coronary atherosclerosis in the LAD and right coronary artery. Atherosclerotic calcifications aortic arch. Thoracic lymphadenopathy, including: --10 mm short axis AP window node (series 4/image 66), max SUV 5.7 --11 mm short axis partially calcified prevascular node (series 4/image 68) --12 mm short axis right hilar node (series 4/image 70), max SUV 6.5 --13 mm short axis partially calcified  subcarinal node (series 4/image 32), max SUV 6.9 --11 mm short axis left hilar node (series 4/image 32), max SUV 6.6 Visualized right thyroid is notable for an 18 x 10 mm right thyroid nodule (series 4/image 45). ABDOMEN/PELVIS No abnormal hypermetabolic activity within the liver, pancreas, or spleen. 2.1 x 2.4 cm right adrenal nodule, unchanged from prior 2016 CT abdomen pelvis, max SUV 5.9. Left renal sinus cysts. Atherosclerotic calcification of the abdominal aorta and branch vessels. Prostatomegaly, with enlargement of the central gland which indents the base the bladder. Bladder is mildly thick-walled although underdistended. Tiny fat containing bilateral inguinal hernias. No hypermetabolic lymph nodes in the abdomen or pelvis. SKELETON No focal hypermetabolic activity to suggest skeletal metastasis. IMPRESSION: 12 x 10 mm hypermetabolic right middle lobe nodule, suspicious for primary bronchogenic neoplasm. Hypermetabolic thoracic lymphadenopathy, most of which is partially calcified, favored to reflect granulomatous disease such as sarcoidosis. Nodal metastases is considered less likely. 2.1 x 2.4 cm hypermetabolic right adrenal nodule, unchanged from June 2016. While technically indeterminate, metastasis not excluded, stability favors a benign etiology such as an adrenal adenoma. Electronically Signed   By: Julian Hy M.D.   On: 01/01/2016 10:47  I have independently reviewed the above radiology studies  and reviewed the findings with the patient.  Recent Lab Findings: Lab Results  Component Value Date   WBC 5.5 09/18/2016   HGB 11.6 (L) 09/18/2016   HCT 34.0 (L) 09/18/2016   PLT 127 (L) 09/18/2016   GLUCOSE 113 (H) 09/18/2016   ALT 23 01/25/2016   AST 18 01/25/2016   NA 139 09/18/2016   K 3.8 09/18/2016   CL 103 09/18/2016   CREATININE 1.20 09/18/2016   BUN 24 (H) 09/18/2016   CO2 27 01/26/2016   INR 1.07 01/25/2016   Chronic Kidney Disease   Stage I     GFR >90  Stage II     GFR 60-89  Stage IIIA GFR 45-59  Stage IIIB GFR 30-44  Stage IV   GFR 15-29  Stage V    GFR  <15  Lab Results  Component Value Date   CREATININE 1.20 09/18/2016   CrCl cannot be calculated (Patient's most recent lab result is older than the maximum 21 days allowed.).  PFT's: FEV1 3.07 100% Dlco 25  79%  Conclusions: The diffusion defect is consistent with a pulmonary vascular process. Anemia cannot be excluded as a potential cause of the diffusion defect without correcting the observed diffusing capacity for hemoglobin. Pulmonary Function Diagnosis: Minimal Diffusion Defect -Pulmonary Vascular  ECHO: LV EF: 65% - 70%  ------------------------------------------------------------------- History: PMH: Dyspnea. Risk factors: Smokless tabacco user. Former tobacco use. Hypertension. Diabetes mellitus. Dyslipidemia.  ------------------------------------------------------------------- Study Conclusions  - Left ventricle:  The cavity size was normal. There was mild  concentric hypertrophy. Systolic function was vigorous. The  estimated ejection fraction was in the range of 65% to 70%. Wall  motion was normal; there were no regional wall motion  abnormalities. Doppler parameters are consistent with abnormal  left ventricular relaxation (grade 1 diastolic dysfunction).  There was no evidence of elevated ventricular filling pressure by  Doppler parameters. - Aortic valve: Trileaflet; normal thickness leaflets. Valve area  (Vmax): 1.58 cm^2. - Aortic root: The aortic root was normal in size. - Left atrium: The atrium was mildly dilated. - Right ventricle: Systolic function was normal. - Right atrium: The atrium was normal in size. - Tricuspid valve: There was trivial regurgitation. - Pulmonary arteries: Systolic pressure was within the normal  range. - Inferior vena cava: The vessel was normal in size. - Pericardium, extracardiac: There was no pericardial  effusion.  Transthoracic echocardiography. M-mode, complete 2D, spectral Doppler, and color Doppler. Birthdate: Patient birthdate: [birth year]. Age: Patient is 76 yr old. Sex: Gender: male. BMI: 27.8 kg/m^2. Blood pressure: 126/50 Patient status: Outpatient. Study date: Study date: 01/23/2016. Study time: 10:07 AM.  -------------------------------------------------------------------  ------------------------------------------------------------------- Left ventricle: The cavity size was normal. There was mild concentric hypertrophy. Systolic function was vigorous. The estimated ejection fraction was in the range of 65% to 70%. Wall motion was normal; there were no regional wall motion abnormalities. The transmitral flow pattern was normal. The deceleration time of the early transmitral flow velocity was normal. The pulmonary vein flow pattern was normal. The tissue Doppler parameters were normal. Doppler parameters are consistent with abnormal left ventricular relaxation (grade 1 diastolic dysfunction). There was no evidence of elevated ventricular filling pressure by Doppler parameters.  ------------------------------------------------------------------- Aortic valve: Trileaflet; normal thickness leaflets. Mobility was not restricted. Sclerosis without stenosis. Doppler: Transvalvular velocity was within the normal range. There was no stenosis. There was no regurgitation. Peak velocity ratio of LVOT to aortic valve: 0.62. Valve area (Vmax): 1.58 cm^2. Indexed valve area (Vmax): 0.75 cm^2/m^2. Peak gradient (S): 9 mm Hg.  ------------------------------------------------------------------- Aorta: Aortic root: The aortic root was normal in size.  ------------------------------------------------------------------- Mitral valve: Structurally normal valve. Mobility was not restricted. Doppler: Transvalvular velocity was within the normal range.  There was no evidence for stenosis. There was no regurgitation. Peak gradient (D): 2 mm Hg.  ------------------------------------------------------------------- Left atrium: The atrium was mildly dilated.  ------------------------------------------------------------------- Right ventricle: The cavity size was normal. Wall thickness was normal. Systolic function was normal.  ------------------------------------------------------------------- Pulmonic valve: Doppler: Transvalvular velocity was within the normal range. There was no evidence for stenosis.  ------------------------------------------------------------------- Tricuspid valve: Structurally normal valve. Doppler: Transvalvular velocity was within the normal range. There was trivial regurgitation.  ------------------------------------------------------------------- Pulmonary artery: The main pulmonary artery was normal-sized. Systolic pressure was within the normal range.  ------------------------------------------------------------------- Right atrium: The atrium was normal in size.  ------------------------------------------------------------------- Pericardium: There was no pericardial effusion.  ------------------------------------------------------------------- Systemic veins: Inferior vena cava: The vessel was normal in size.  ------------------------------------------------------------------- Measurements  Left ventricle Value Reference LV ID, ED, PLAX chordal 44.7 mm 43 - 52 LV ID, ES, PLAX chordal 28.9 mm 23 - 38 LV fx shortening, PLAX chordal 35 % >=29 LV PW thickness, ED 12.9 mm --------- IVS/LV PW ratio, ED 1.02 <=1.3 Stroke volume, 2D 64 ml --------- Stroke volume/bsa,  2D 30 ml/m^2 --------- LV ejection fraction, 1-p A4C 57 % --------- LV end-diastolic volume, 2-p [id number] ml --------- LV end-systolic volume, 2-p 52 ml --------- LV ejection fraction, 2-p 60 % ---------  Stroke volume, 2-p 78 ml --------- LV end-diastolic volume/bsa, 2-p 62 ml/m^2 --------- LV end-systolic volume/bsa, 2-p 25 ml/m^2 --------- Stroke volume/bsa, 2-p 37.1 ml/m^2 --------- LV e&', lateral 8.09 cm/s --------- LV E/e&', lateral 9.38 --------- LV s&', lateral 16 cm/s --------- LV e&', medial 8.29 cm/s --------- LV E/e&', medial 9.16 --------- LV e&', average 8.19 cm/s --------- LV E/e&', average 9.27 ---------  Ventricular septum Value Reference IVS thickness, ED 13.1 mm ---------  LVOT Value Reference LVOT ID, S 18 mm --------- LVOT area 2.54 cm^2 --------- LVOT peak velocity, S 93.1 cm/s --------- LVOT mean velocity, S 67 cm/s --------- LVOT VTI, S 25.2 cm ---------  Aortic valve Value Reference Aortic valve peak velocity, S 150 cm/s --------- Aortic peak gradient, S 9 mm Hg --------- Velocity ratio, peak, LVOT/AV 0.62 --------- Aortic valve  area, peak velocity 1.58 cm^2 --------- Aortic valve area/bsa, peak 0.75 cm^2/m^2 --------- velocity  Aorta Value Reference Aortic root ID, ED 31 mm ---------  Left atrium Value Reference LA ID, A-P, ES 42 mm --------- LA ID/bsa, A-P 2 cm/m^2 <=2.2 LA volume, S 44 ml --------- LA volume/bsa, S 20.9 ml/m^2 --------- LA volume, ES, 1-p A4C 44 ml --------- LA volume/bsa, ES, 1-p A4C 20.9 ml/m^2 --------- LA volume, ES, 1-p A2C 42 ml --------- LA volume/bsa, ES, 1-p A2C 20 ml/m^2 ---------  Mitral valve Value Reference Mitral E-wave peak velocity 75.9 cm/s --------- Mitral A-wave peak velocity 103 cm/s --------- Mitral deceleration time (H) 306 ms 150 - 230 Mitral peak gradient, D 2 mm Hg --------- Mitral E/A ratio, peak 0.8 --------- Mitral maximal regurg velocity, 341 cm/s --------- PISA  Pulmonary arteries Value Reference PA pressure, S, DP 20 mm Hg <=30  Tricuspid valve Value Reference Tricuspid regurg peak velocity 204 cm/s --------- Tricuspid peak RV-RA gradient 17 mm Hg ---------  Right ventricle Value Reference RV ID, ED, PLAX 30.7 mm 19 - 38  Pulmonic valve Value  Reference Pulmonic valve peak velocity, S 80.2 cm/s ---------  Legend: (L) and (H) mark values outside specified reference range.  ------------------------------------------------------------------- Prepared and Electronically Authenticated by  Ena Dawley, M.D. 2017-03-22T12:44:23  Notes Recorded by Sanda Klein, MD on 01/23/2016 at 2:20 PM Both the stress test and the cho are low risk. The aortic valve is sclerotic, but not stenotic. The LVEF is normal. OK to proceed with surgical procedure with Dr. Servando Snare.       Assessment / Plan:   Right middle lobe 1 cm lung nodule enlarging with hypermetabolic right adrenal lesion and hypermetabolic bilateral hilar nodes- without definitive tissue diagnosis. Results of the PET scan were reviewed with the patient and his wife . I discussed with the patient and his wife proceeding with surgical resection of the lesion in the right middle lobe. For treatment and DX. the patient at this point is again agreeable with proceeding with surgery but would like to go home and check the calendar for the signs if it would be okay to proceed next week.   Risks and options were again reviewed with them  [name] MD      [address] [place] [address] Office [phone]   Beeper [phone]  12/31/2016 4:48 PM

## 2017-01-01 ENCOUNTER — Telehealth: Payer: Self-pay | Admitting: *Deleted

## 2017-01-01 NOTE — Telephone Encounter (Signed)
Patient saw Dr. Servando Snare to discuss lung surgery on Wednesday 2/28.  The plan was for surgery on Monday 3/5 @ 0730am but they wanted to go home and think about it.  I called the patient just now to see if they were okay with surgery on Monday and he said the "signs" were not pointing to yes so he would call me when he wanted to schedule.  Dr. Servando Snare made aware. Patient will call me back once ready to schedule.

## 2017-10-25 ENCOUNTER — Encounter (HOSPITAL_COMMUNITY): Payer: Self-pay | Admitting: *Deleted

## 2017-10-25 ENCOUNTER — Emergency Department (HOSPITAL_COMMUNITY): Payer: Medicare Other

## 2017-10-25 ENCOUNTER — Emergency Department (HOSPITAL_COMMUNITY)
Admission: EM | Admit: 2017-10-25 | Discharge: 2017-10-25 | Disposition: A | Discharge status: Home / Self Care | Payer: Medicare Other | Attending: Emergency Medicine | Admitting: Emergency Medicine

## 2017-10-25 ENCOUNTER — Other Ambulatory Visit: Payer: Self-pay

## 2017-10-25 DIAGNOSIS — Z7982 Long term (current) use of aspirin: Secondary | ICD-10-CM | POA: Insufficient documentation

## 2017-10-25 DIAGNOSIS — I10 Essential (primary) hypertension: Secondary | ICD-10-CM | POA: Diagnosis not present

## 2017-10-25 DIAGNOSIS — B9789 Other viral agents as the cause of diseases classified elsewhere: Secondary | ICD-10-CM

## 2017-10-25 DIAGNOSIS — J069 Acute upper respiratory infection, unspecified: Secondary | ICD-10-CM | POA: Diagnosis not present

## 2017-10-25 DIAGNOSIS — Z79899 Other long term (current) drug therapy: Secondary | ICD-10-CM | POA: Diagnosis not present

## 2017-10-25 DIAGNOSIS — E119 Type 2 diabetes mellitus without complications: Secondary | ICD-10-CM | POA: Insufficient documentation

## 2017-10-25 DIAGNOSIS — Z87891 Personal history of nicotine dependence: Secondary | ICD-10-CM | POA: Diagnosis not present

## 2017-10-25 DIAGNOSIS — R0981 Nasal congestion: Secondary | ICD-10-CM | POA: Diagnosis present

## 2017-10-25 LAB — BASIC METABOLIC PANEL
Anion gap: 7 (ref 5–15)
BUN: 15 mg/dL (ref 6–20)
CO2: 25 mmol/L (ref 22–32)
Calcium: 9.1 mg/dL (ref 8.9–10.3)
Chloride: 105 mmol/L (ref 101–111)
Creatinine, Ser: 1.2 mg/dL (ref 0.61–1.24)
GFR calc Af Amer: >60 mL/min (ref >60)
GFR calc non Af Amer: 57 mL/min — ABNORMAL LOW (ref >60)
Glucose, Bld: 140 mg/dL — ABNORMAL HIGH (ref 65–99)
Potassium: 3.6 mmol/L (ref 3.5–5.1)
Sodium: 137 mmol/L (ref 135–145)

## 2017-10-25 LAB — CBC
HCT: 36.1 % — ABNORMAL LOW (ref 39.0–52.0)
Hemoglobin: 12.4 g/dL — ABNORMAL LOW (ref 13.0–17.0)
MCH: 34.8 pg — ABNORMAL HIGH (ref 26.0–34.0)
MCHC: 34.3 g/dL (ref 30.0–36.0)
MCV: 101.4 fL — ABNORMAL HIGH (ref 78.0–100.0)
Platelets: 127 10*3/uL — ABNORMAL LOW (ref 150–400)
RBC: 3.56 MIL/uL — ABNORMAL LOW (ref 4.22–5.81)
RDW: 12.6 % (ref 11.5–15.5)
WBC: 5.1 10*3/uL (ref 4.0–10.5)

## 2017-10-25 LAB — I-STAT TROPONIN, ED: Troponin i, poc: 0 ng/mL (ref 0.00–0.08)

## 2017-10-25 MED ORDER — BENZONATATE 100 MG PO CAPS: 100.0000 mg | ORAL_CAPSULE | Freq: Three times a day (TID) | ORAL | 0 refills | Status: DC

## 2017-10-25 NOTE — ED Notes (Signed)
Patient transported to X-ray 

## 2017-10-25 NOTE — ED Triage Notes (Signed)
PT 98% on RA pt going for chest x-ray

## 2017-10-25 NOTE — ED Provider Notes (Signed)
76 year old male, presents with coughing and shortness of breath, has been going on for 3 or 4 days.  Has been using some cough drops with minimal relief but last night had some chest pain with the coughing and wanted to get checked secondary to a prior history of what he describes as "a hole in my lung with a pneumonia".  In the past he been treated successfully for whatever that pathology was but was told if he ever had cough and chest pain he should return to the ER to look for another pneumonia.  On exam the patient is well-appearing without any distress, speaks in full sentences, he is slightly hard of hearing, his heart sounds are regular without murmurs or arrhythmia, his pulse is approximately 65 with strong radial artery pulses, no edema or JVD and clear lung sounds diffusely with no expiratory wheezing.    The patient has no distress, his x-ray is negative for infiltrate and he appears well for discharge, likely viral process, informed the patient and family members of  indications for return, they expressed her understanding.  Vitals:   10/25/17 0937  BP: (!) 170/65  Pulse: 64  Resp: 18  Temp: 97.7 F (36.5 C)  TempSrc: Oral  SpO2: 99%    EKG Interpretation  Date/Time:  Sunday October 25 2017 09:34:52 EST Ventricular Rate:  64 PR Interval:  178 QRS Duration: 98 QT Interval:  420 QTC Calculation: 433 R Axis:   89 Text Interpretation:  Normal sinus rhythm Normal ECG since last tracing no significant change Confirmed by Noemi Chapel [phone]) on 10/25/2017 11:19:55 AM       Medical screening examination/treatment/procedure(s) were conducted as a shared visit with non-physician practitioner(s) and myself.  I personally evaluated the patient during the encounter.  Clinical Impression:   Final diagnoses:  Viral URI with cough          Noemi Chapel, MD 10/26/17 [phone]

## 2017-10-25 NOTE — Discharge Instructions (Signed)
Please read attached information. If you experience any new or worsening signs or symptoms please return to the emergency room for evaluation. Please follow-up with your primary care provider or specialist as discussed. Please use medication prescribed only as directed and discontinue taking if you have any concerning signs or symptoms.   °

## 2017-10-25 NOTE — ED Triage Notes (Signed)
Pt having cough and cold symptoms x 2 weeks, having sob and pt thinks he has pneumonia. No resp distress is noted at triage.

## 2017-10-25 NOTE — ED Provider Notes (Signed)
[facility] EMERGENCY DEPARTMENT Provider Note   CSN: [id number] Arrival date & time: 10/25/17  1950     History   Chief Complaint Chief Complaint  Patient presents with  . Shortness of Breath  . Cough    HPI James Sweeney is a 76 y.o. male.  HPI   76 year old male presents today with complaints of upper respiratory infection.  Patient reports symptoms started approximately 1 week ago with nasal congestion, sneezing and nonproductive cough.  He notes symptoms have persisted with the addition of sore throat.  He notes the sore throat has gone away today, notes he was having difficulty breathing through his nose last night.  He denies any significant chest pain presently, denies any lower extremity swelling or edema, history DVT or PE.  Patient denies any significant cardiac history.  Patient does note a history of a pulmonary nodule that was biopsied.  This biopsy showed no malignant tissue, but they were working with a small sample, recommended to have repeat biopsy, patient would not like to continue on with evaluation of this.  Patient denies any fever at home.  Patient reports that over the last several weeks he has had some indigestion causing some chest discomfort, he denies any presently.  Past Medical History:  Diagnosis Date  . Cervical spondylosis without myelopathy 12/01/2013  . Diabetes mellitus    borderline  . Enlarged prostate   . Hearing deficit   . Heart murmur   . History of kidney stones    removed with Lithotripsy  . HOH (hard of hearing)    right ear  . Hyperlipemia   . Hypertension   . Lumbago 12/01/2013  . Pneumonia 2016  . Polio    right leg smaller than left  . Shortness of breath dyspnea    with exertion    Patient Active Problem List   Diagnosis Date Noted  . Pneumothorax 01/25/2016  . Pneumothorax after biopsy 01/25/2016  . Coronary artery calcification seen on CT scan 01/21/2016  . Murmur, cardiac 01/21/2016  .  Hyperlipidemia 01/21/2016  . Essential hypertension 01/09/2016  . BPH (benign prostatic hyperplasia) 01/09/2016  . Solitary pulmonary nodule 01/09/2016  . Thrombocytopenia ([place]) 01/09/2016  . Cervical spondylosis without myelopathy 12/01/2013  . Lumbago 12/01/2013  . Diabetes mellitus     Past Surgical History:  Procedure Laterality Date  . CATARACT EXTRACTION Bilateral 2014  . VIDEO BRONCHOSCOPY WITH ENDOBRONCHIAL NAVIGATION N/A 01/25/2016   Procedure: VIDEO BRONCHOSCOPY WITH ENDOBRONCHIAL NAVIGATION;  Surgeon: [name], MD;  Location: [facility];  Service: Thoracic;  Laterality: N/A;  . VIDEO BRONCHOSCOPY WITH ENDOBRONCHIAL ULTRASOUND N/A 01/25/2016   Procedure: VIDEO BRONCHOSCOPY WITH ENDOBRONCHIAL ULTRASOUND;  Surgeon: [name], MD;  Location: [facility];  Service: Thoracic;  Laterality: N/A;       Home Medications    Prior to Admission medications   Medication Sig Start Date End Date Taking? Authorizing Provider  alfuzosin (UROXATRAL) 10 MG 24 hr tablet Take 10 mg by mouth daily.    [provider]  amLODipine (NORVASC) 10 MG tablet Take 10 mg by mouth daily.    [provider]  Ascorbic Acid (VITAMIN C) 100 MG tablet Take 1,000 mg by mouth daily.     [provider]  aspirin EC 81 MG EC tablet Take 1 tablet (81 mg total) by mouth daily. 01/28/16   Barrett, Erin R, PA-C  benzonatate (TESSALON) 100 MG capsule Take 1 capsule (100 mg total) by mouth every  8 (eight) hours. 10/25/17   Geraldene Eisel, Dellis Filbert, PA-C  Cyanocobalamin (VITAMIN B-12) 5000 MCG SUBL Place 1 tablet under the tongue daily.    [provider]  finasteride (PROSCAR) 5 MG tablet Take 5 mg by mouth daily. 05/16/15   [provider]  lisinopril-hydrochlorothiazide (PRINZIDE,ZESTORETIC) 20-25 MG per tablet Take 1 tablet by mouth daily. 03/12/15   [provider]  Omega-3 Fatty Acids (FISH OIL) 1000 MG CAPS Take 1,000 mg by mouth daily.    [provider]    polyethylene glycol (MIRALAX / GLYCOLAX) packet Take 17 g by mouth daily. 09/21/16   de Villier, Daryl F II, PA  simvastatin (ZOCOR) 20 MG tablet Take 20 mg by mouth every evening.    [provider]  traMADol (ULTRAM) 50 MG tablet Take 1 tablet (50 mg total) by mouth every 6 (six) hours as needed. Patient taking differently: Take 50 mg by mouth every 6 (six) hours as needed for moderate pain.  01/28/16   Barrett, Erin R, PA-C  vitamin E 400 UNIT capsule Take 400 Units by mouth daily.    [provider]    Family History Family History  Problem Relation Age of Onset  . Cancer Mother        Stomach  . Cancer - Lung Father   . Heart attack Brother   . Diabetes Brother     Social History Social History   Tobacco Use  . Smoking status: Former Smoker    Packs/day: 0.50    Years: 9.00    Pack years: 4.50    Types: Cigarettes    Last attempt to quit: 12/02/1967    Years since quitting: 49.9  . Smokeless tobacco: Current User    Types: Chew  Substance Use Topics  . Alcohol use: No  . Drug use: No     Allergies   Levaquin [levofloxacin in d5w] and Penicillins   Review of Systems Review of Systems  All other systems reviewed and are negative.    Physical Exam Updated Vital Signs BP (!) 170/65 (BP Location: Right Arm)   Pulse 64   Temp 97.7 F (36.5 C) (Oral)   Resp 18   SpO2 99%   Physical Exam  Constitutional: He is oriented to person, place, and time. He appears well-developed and well-nourished.  HENT:  Head: Normocephalic and atraumatic.  Mouth/Throat: Uvula is midline, oropharynx is clear and moist and mucous membranes are normal. No oropharyngeal exudate, posterior oropharyngeal edema, posterior oropharyngeal erythema or tonsillar abscesses.  Eyes: Conjunctivae are normal. Pupils are equal, round, and reactive to light. Right eye exhibits no discharge. Left eye exhibits no discharge. No scleral icterus.  Neck: Normal range of motion. No JVD  present. No tracheal deviation present.  Cardiovascular: Normal rate, regular rhythm and intact distal pulses. Exam reveals no gallop and no friction rub.  No murmur heard. Pulmonary/Chest: Effort normal and breath sounds normal. No stridor. No respiratory distress. He has no wheezes. He has no rales. He exhibits no tenderness.  Musculoskeletal: Normal range of motion. He exhibits no edema.  Neurological: He is alert and oriented to person, place, and time. Coordination normal.  Psychiatric: He has a normal mood and affect. His behavior is normal. Judgment and thought content normal.  Nursing note and vitals reviewed.   ED Treatments / Results  Labs (all labs ordered are listed, but only abnormal results are displayed) Labs Reviewed  BASIC METABOLIC PANEL - Abnormal; Notable for the following components:  Result Value   Glucose, Bld 140 (*)    GFR calc non Af Amer 57 (*)    All other components within normal limits  CBC - Abnormal; Notable for the following components:   RBC 3.56 (*)    Hemoglobin 12.4 (*)    HCT 36.1 (*)    MCV 101.4 (*)    MCH 34.8 (*)    Platelets 127 (*)    All other components within normal limits  I-STAT TROPONIN, ED    EKG  EKG Interpretation  Date/Time:  "Sunday October 25 2017 09:34:52 EST Ventricular Rate:  64 PR Interval:  178 QRS Duration: 98 QT Interval:  420 QTC Calculation: 433 R Axis:   89 Text Interpretation:  Normal sinus rhythm Normal ECG since last tracing no significant change Confirmed by Miller, Brian ([id number]) on 10/25/2017 11:19:55 AM       Radiology Dg Chest 2 View  Result Date: 10/25/2017 CLINICAL DATA:  Cough, shortness of breath. EXAM: CHEST  2 VIEW COMPARISON:  Radiographs of February 27, 2016. PET scan of December 30, 2016. FINDINGS: Stable cardiomegaly. Atherosclerosis of thoracic aorta is noted. Right middle lobe nodule is noted as described on prior PET scan consistent with neoplasm. No pneumothorax or pleural effusion  is noted. Left lung is clear. IMPRESSION: Continued presence of right middle lobe nodule consistent with neoplasm as described on prior PET scan. Aortic atherosclerosis. No other definite abnormality seen in the chest. Electronically Signed   By: James  Green Jr, M.D.   On: 10/25/2017 10:16    Procedures Procedures (including critical care time)  Medications Ordered in ED Medications - No data to display   Initial Impression / Assessment and Plan / ED Course  I have reviewed the triage vital signs and the nursing notes.  Pertinent labs & imaging results that were available during my care of the patient were reviewed by me and considered in my medical decision making (see chart for details).     Final Clinical Impressions(s) / ED Diagnoses   Final diagnoses:  Viral URI with cough   Labs: I-STAT troponin, BMP, CBC  Imaging: DG chest 2 view  Consults:  Therapeutics:  Discharge Meds:   Assessment/Plan:   Patient's presentation is most likely viral upper respiratory infection.  He is very well-appearing in no acute distress.  He is clear lung sounds afebrile with no white count or vital sign abnormalities.  Very low suspicion for bacterial etiology.  Patient has had vague chest discomfort over the last several weeks, none presently, patient will need outpatient follow-up with cardiology with strict return precautions.  He verbalized understanding and agreement to today's plan had no further questions or concerns.    ED Discharge Orders        Ordered    benzonatate (TESSALON) 100 MG capsule  Every 8 hours     12" /23/18 1129       HedgesDellis Filbert, PA-C 10/25/17 1131    Noemi Chapel, MD 10/26/17 [phone]

## 2018-01-18 NOTE — Progress Notes (Signed)
Patient ID: James Sweeney, male   DOB: July 20, 1941, 77 y.o.   MRN: [id number]     Cardiology Office Note    Date:  01/19/2018   ID:  James Sweeney, DOB 08-14-1941, MRN [id number]  PCP:  Antony Contras, MD  Cardiologist:   Sanda Klein, MD   No chief complaint on file.   History of Present Illness:  James Sweeney is a 77 y.o. male with numerous coronary risk factors, but a benign 2017 workup for coronary artery calcification seen on chest CT returning for follow-up.  His initial evaluation was triggered by preoperative risk assessment before biopsy of an enlarging right middle lobe lung nodule.  He had a low risk nuclear stress test and a normal echocardiogram.  The biopsy showed benign lung tissue, but there is still suspicion for a smoldering malignancy, as it lights up on PET scan and is slightly larger than a year ago.  He has hypermetabolic bilateral hilar lymph nodes and a hypermetabolic right adrenal lesion.  Dr. Servando Snare has recommended that he have another attempt at sampling the mass, but he has been reluctant to do so, because of the pain and prolonged recovery after the initial procedure  He remains very physically active and does not have angina or dyspnea with activity or at rest.  Activity is primarily limited by the fact that his right leg is a little shorter than his left following polio in childhood.  He had an accident several months ago where he fell out of a tree while trying to take down a deer stand.  Thankfully he did not have serious injuries.  He has not had any other falls.  He is very hard of hearing, but has no other neurological complaints.    He is unaware of palpitations, despite the fact that his electrocardiogram today shows atrial fibrillation with spontaneously controlled ventricular response.  This is a new diagnosis.  He does not have a history of stroke/TIA or other embolic events.  His echocardiogram in 2017 normal left ventricular systolic function with a  EF of 65-70% and mild LVH, mildly dilated left atrium (end-systolic diameter 42 mm).   Past Medical History:  Diagnosis Date  . Cervical spondylosis without myelopathy 12/01/2013  . Diabetes mellitus    borderline  . Enlarged prostate   . Hearing deficit   . Heart murmur   . History of kidney stones    removed with Lithotripsy  . HOH (hard of hearing)    right ear  . Hyperlipemia   . Hypertension   . Lumbago 12/01/2013  . Pneumonia 2016  . Polio    right leg smaller than left  . Shortness of breath dyspnea    with exertion    Past Surgical History:  Procedure Laterality Date  . CATARACT EXTRACTION Bilateral 2014  . VIDEO BRONCHOSCOPY WITH ENDOBRONCHIAL NAVIGATION N/A 01/25/2016   Procedure: VIDEO BRONCHOSCOPY WITH ENDOBRONCHIAL NAVIGATION;  Surgeon: [name], MD;  Location: [facility];  Service: Thoracic;  Laterality: N/A;  . VIDEO BRONCHOSCOPY WITH ENDOBRONCHIAL ULTRASOUND N/A 01/25/2016   Procedure: VIDEO BRONCHOSCOPY WITH ENDOBRONCHIAL ULTRASOUND;  Surgeon: [name], MD;  Location: [facility];  Service: Thoracic;  Laterality: N/A;    Outpatient Medications Prior to Visit  Medication Sig Dispense Refill  . alfuzosin (UROXATRAL) 10 MG 24 hr tablet Take 10 mg by mouth daily.    [name] amLODipine (NORVASC) 10 MG tablet Take 10 mg by mouth daily.    . Ascorbic Acid (VITAMIN  C) 100 MG tablet Take 1,000 mg by mouth daily.     . benzonatate (TESSALON) 100 MG capsule Take 1 capsule (100 mg total) by mouth every 8 (eight) hours. 21 capsule 0  . Cyanocobalamin (VITAMIN B-12) 5000 MCG SUBL Place 1 tablet under the tongue daily.    . finasteride (PROSCAR) 5 MG tablet Take 5 mg by mouth daily.    [name] lisinopril-hydrochlorothiazide (PRINZIDE,ZESTORETIC) 20-25 MG per tablet Take 1 tablet by mouth daily.  1  . Omega-3 Fatty Acids (FISH OIL) 1000 MG CAPS Take 1,000 mg by mouth daily.    . polyethylene glycol (MIRALAX / GLYCOLAX) packet Take 17 g by mouth daily. 7 each 0  . simvastatin  (ZOCOR) 20 MG tablet Take 20 mg by mouth every evening.    . traMADol (ULTRAM) 50 MG tablet Take 1 tablet (50 mg total) by mouth every 6 (six) hours as needed. (Patient taking differently: Take 50 mg by mouth every 6 (six) hours as needed for moderate pain. ) 30 tablet 0  . vitamin E 400 UNIT capsule Take 400 Units by mouth daily.    [name] aspirin EC 81 MG EC tablet Take 1 tablet (81 mg total) by mouth daily.     No facility-administered medications prior to visit.      Allergies:   Levaquin [levofloxacin in d5w] and Penicillins   Social History   Socioeconomic History  . Marital status: Married    Spouse name: None  . Number of children: 2  . Years of education: None  . Highest education level: None  Social Needs  . Financial resource strain: None  . Food insecurity - worry: None  . Food insecurity - inability: None  . Transportation needs - medical: None  . Transportation needs - non-medical: None  Occupational History  . None  Tobacco Use  . Smoking status: Former Smoker    Packs/day: 0.50    Years: 9.00    Pack years: 4.50    Types: Cigarettes    Last attempt to quit: 12/02/1967    Years since quitting: 50.1  . Smokeless tobacco: Current User    Types: Chew  Substance and Sexual Activity  . Alcohol use: No  . Drug use: No  . Sexual activity: No  Other Topics Concern  . None  Social History Narrative   Patient is right handed.   Patient drinks 1 cup caffeine daily.      Pt has been married for 51 years and has 2 children, 4 grandchildren, and 3 great-grandchildren. Lives with everyone except great-grandchildren. He does not clean house, but can shop, do yard work, and drive. 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         Epworth Sleepiness Scale Score:  8      --I have HTN   --I seem to be losing my sex drive   --I wake up to urinate frequently at night   --I awake feeling not rested   --I have borderline diabetes     Family History:   The patient's family history includes Cancer in his mother; Cancer - Lung in his father; Diabetes in his brother; Heart attack in his brother.   ROS:   Please see the history of present illness.    ROS All other systems reviewed and are negative.   PHYSICAL EXAM:   VS:  BP 138/66   Pulse 82   Ht 5\' 10"  (1.778 m)   Wt 191 lb  3.2 oz (86.7 kg)   BMI 27.43 kg/m    GEN: Well nourished, well developed, in no acute distress  HEENT: normal  Neck: no JVD, carotid bruits, or masses Cardiac: RRR; early peaking 0-3/5 systolic ejection murmur in the aortic focus without much radiation, no diastolic murmurs, rubs, or gallops,no edema  Respiratory:  clear to auscultation bilaterally, normal work of breathing GI: soft, nontender, nondistended, + BS MS: no deformity or atrophy  Skin: warm and dry, no rash Neuro:  Alert and Oriented x 3, Strength and sensation are intact Psych: euthymic mood, full affect  Wt Readings from Last 3 Encounters:  01/19/18 191 lb 3.2 oz (86.7 kg)  12/31/16 193 lb (87.5 kg)  12/24/16 193 lb (87.5 kg)      Studies/Labs Reviewed:   EKG:  EKG is ordered today.  The ekg ordered today demonstrates atrial fibrillation controlled ventricular rate, otherwise normal.  QTc 441 ms.  Recent Labs: 10/25/2017: BUN 15; Creatinine, Ser 1.20; Hemoglobin 12.4; Platelets 127; Potassium 3.6; Sodium 137   Lipid Panel February 21st 2019 total cholesterol 130, HDL 54, LDL 69, triglycerides 38 Hemoglobin A1c 6.1%, creatinine 1.12, hemoglobin 12.7, TSH 1.90.   ASSESSMENT:    1. Coronary artery calcification seen on CT scan   2. New onset atrial fibrillation ([place])   3. Mass of middle lobe of right lung   4. Essential hypertension   5. Pure hypercholesterolemia      PLAN:  In order of problems listed above:   1. AFib: Diagnosed and completely asymptomatic, spontaneously rate controlled without medications.  Unclear whether this is paroxysmal or persistent.  It does not make  much sense to recommend cardioversion or antiarrhythmic therapy since he is completely asymptomatic.  Discussed the risk of stroke and the need for chronic anticoagulation therapy.  CHADSVasc at least 3(age 54, HTN, +/-CAD). We will start with Xarelto 20 mg daily with the evening meal and reassess in a few months.  If he should decide to again have a evaluation of his lung mass, the anticoagulant should be stopped a minimum of 2 days before any invasive procedures.  We will stop aspirin once he starts the oral anticoagulant. 2. Coronary calcification: Normal Lexiscan Myoview in March 2017.  Remains asymptomatic.  Focus is on risk factors, which are well controlled. 3. Right lung mass: Possible primary lung neoplasm with secondary involvement of mediastinal lymph nodes and the adrenal gland.  Biopsy was nondiagnostic.  He is reluctant to have another procedure, but I recommended that he go back and discuss this with Dr. Servando Snare. 4. HTN: Well controlled 5. HLP: On statin.  All lipid parameters are within desirable range  I think we should be able to get his echocardiogram and stress test reports before the end of the week without excessive delay in his workup with a pulmonary mass  Medication Adjustments/Labs and Tests Ordered: Current medicines are reviewed at length with the patient today.  Concerns regarding medicines are outlined above.  Medication changes, Labs and Tests ordered today are listed in the Patient Instructions below. Patient Instructions  Medication Instructions:  START XARELTO 20 MG DAILY WITH FOOD IN THE EVENING  STOP ASPIRIN    Labwork: NONE  Testing/Procedures: NONE  Follow-Up: Your physician recommends that you schedule a follow-up appointment in: [place] physician wants you to follow-up in: [place] will receive a reminder letter in the mail two months in advance. If you don't receive  a letter, please call our office to schedule  the follow-up appointment.    Atrial Fibrillation Atrial fibrillation is a type of heartbeat that is irregular or fast (rapid). If you have this condition, your heart keeps quivering in a weird (chaotic) way. This condition can make it so your heart cannot pump blood normally. Having this condition gives a person more risk for stroke, heart failure, and other heart problems. There are different types of atrial fibrillation. Talk with your doctor to learn about the type that you have. Follow these instructions at home:  Take over-the-counter and prescription medicines only as told by your doctor.  If your doctor prescribed a blood-thinning medicine, take it exactly as told. Taking too much of it can cause bleeding. If you do not take enough of it, you will not have the protection that you need against stroke and other problems.  Do not use any tobacco products. These include cigarettes, chewing tobacco, and e-cigarettes. If you need help quitting, ask your doctor.  If you have apnea (obstructive sleep apnea), manage it as told by your doctor.  Do not drink alcohol.  Do not drink beverages that have caffeine. These include coffee, soda, and tea.  Maintain a healthy weight. Do not use diet pills unless your doctor says they are safe for you. Diet pills may make heart problems worse.  Follow diet instructions as told by your doctor.  Exercise regularly as told by your doctor.  Keep all follow-up visits as told by your doctor. This is important. Contact a doctor if:  You notice a change in the speed, rhythm, or strength of your heartbeat.  You are taking a blood-thinning medicine and you notice more bruising.  You get tired more easily when you move or exercise. Get help right away if:  You have pain in your chest or your belly (abdomen).  You have sweating or weakness.  You feel sick to your stomach (nauseous).  You notice blood in your throw up (vomit), poop (stool), or pee  (urine).  You are short of breath.  You suddenly have swollen feet and ankles.  You feel dizzy.  Your suddenly get weak or numb in your face, arms, or legs, especially if it happens on one side of your body.  You have trouble talking, trouble understanding, or both.  Your face or your eyelid droops on one side. These symptoms may be an emergency. Do not wait to see if the symptoms will go away. Get medical help right away. Call your local emergency services (911 in the [place] Do not drive yourself to the hospital. This information is not intended to replace advice given to you by your health care provider. Make sure you discuss any questions you have with your health care provider. Document Released: 07/29/2008 Document Revised: 03/27/2016 Document Reviewed: 02/14/2015 Elsevier Interactive Patient Education  2018 [place], Sanda Klein, MD  01/19/2018 6:44 PM    [facility] [address], [place], [place]  [address] Phone: [phone]; Fax: [phone]

## 2018-01-19 ENCOUNTER — Encounter: Payer: Self-pay | Admitting: Cardiovascular Disease

## 2018-01-19 ENCOUNTER — Ambulatory Visit: Payer: Medicare Other | Admitting: Cardiovascular Disease

## 2018-01-19 VITALS — BP 138/66 | HR 82 | Ht 70.0 in | Wt 191.2 lb

## 2018-01-19 DIAGNOSIS — I2510 Atherosclerotic heart disease of native coronary artery without angina pectoris: Secondary | ICD-10-CM

## 2018-01-19 DIAGNOSIS — I10 Essential (primary) hypertension: Secondary | ICD-10-CM

## 2018-01-19 DIAGNOSIS — I4891 Unspecified atrial fibrillation: Secondary | ICD-10-CM

## 2018-01-19 DIAGNOSIS — I4821 Permanent atrial fibrillation: Secondary | ICD-10-CM | POA: Insufficient documentation

## 2018-01-19 DIAGNOSIS — R918 Other nonspecific abnormal finding of lung field: Secondary | ICD-10-CM | POA: Diagnosis not present

## 2018-01-19 DIAGNOSIS — E7800 Pure hypercholesterolemia, unspecified: Secondary | ICD-10-CM

## 2018-01-19 MED ORDER — RIVAROXABAN 20 MG PO TABS: 20.0000 mg | ORAL_TABLET | Freq: Every day | ORAL | 30 refills | Status: DC

## 2018-01-19 NOTE — Patient Instructions (Addendum)
Medication Instructions:  START XARELTO 20 MG DAILY WITH FOOD IN THE EVENING  STOP ASPIRIN    Labwork: NONE  Testing/Procedures: NONE  Follow-Up: Your physician recommends that you schedule a follow-up appointment in: [place] physician wants you to follow-up in: [place] will receive a reminder letter in the mail two months in advance. If you don't receive a letter, please call our office to schedule the follow-up appointment.    Atrial Fibrillation Atrial fibrillation is a type of heartbeat that is irregular or fast (rapid). If you have this condition, your heart keeps quivering in a weird (chaotic) way. This condition can make it so your heart cannot pump blood normally. Having this condition gives a person more risk for stroke, heart failure, and other heart problems. There are different types of atrial fibrillation. Talk with your doctor to learn about the type that you have. Follow these instructions at home:  Take over-the-counter and prescription medicines only as told by your doctor.  If your doctor prescribed a blood-thinning medicine, take it exactly as told. Taking too much of it can cause bleeding. If you do not take enough of it, you will not have the protection that you need against stroke and other problems.  Do not use any tobacco products. These include cigarettes, chewing tobacco, and e-cigarettes. If you need help quitting, ask your doctor.  If you have apnea (obstructive sleep apnea), manage it as told by your doctor.  Do not drink alcohol.  Do not drink beverages that have caffeine. These include coffee, soda, and tea.  Maintain a healthy weight. Do not use diet pills unless your doctor says they are safe for you. Diet pills may make heart problems worse.  Follow diet instructions as told by your doctor.  Exercise regularly as told by your doctor.  Keep all follow-up visits as told by your doctor. This is  important. Contact a doctor if:  You notice a change in the speed, rhythm, or strength of your heartbeat.  You are taking a blood-thinning medicine and you notice more bruising.  You get tired more easily when you move or exercise. Get help right away if:  You have pain in your chest or your belly (abdomen).  You have sweating or weakness.  You feel sick to your stomach (nauseous).  You notice blood in your throw up (vomit), poop (stool), or pee (urine).  You are short of breath.  You suddenly have swollen feet and ankles.  You feel dizzy.  Your suddenly get weak or numb in your face, arms, or legs, especially if it happens on one side of your body.  You have trouble talking, trouble understanding, or both.  Your face or your eyelid droops on one side. These symptoms may be an emergency. Do not wait to see if the symptoms will go away. Get medical help right away. Call your local emergency services (911 in the [place] Do not drive yourself to the hospital. This information is not intended to replace advice given to you by your health care provider. Make sure you discuss any questions you have with your health care provider. Document Released: 07/29/2008 Document Revised: 03/27/2016 Document Reviewed: 02/14/2015 Elsevier Interactive Patient Education  [employer].

## 2018-01-26 ENCOUNTER — Telehealth: Payer: Self-pay | Admitting: *Deleted

## 2018-01-26 NOTE — Telephone Encounter (Signed)
Call James Sweeney phone with appointment

## 2018-02-10 ENCOUNTER — Telehealth: Payer: Self-pay | Admitting: Cardiovascular Disease

## 2018-02-10 NOTE — Telephone Encounter (Signed)
Closed Encounter  °

## 2018-02-11 ENCOUNTER — Ambulatory Visit (INDEPENDENT_AMBULATORY_CARE_PROVIDER_SITE_OTHER): Payer: Medicare Other | Admitting: Cardiothoracic Surgery

## 2018-02-11 ENCOUNTER — Other Ambulatory Visit: Payer: Self-pay | Admitting: *Deleted

## 2018-02-11 ENCOUNTER — Other Ambulatory Visit: Payer: Self-pay

## 2018-02-11 ENCOUNTER — Encounter: Payer: Self-pay | Admitting: Cardiothoracic Surgery

## 2018-02-11 VITALS — BP 127/63 | HR 76 | Resp 16 | Ht 70.0 in | Wt 191.0 lb

## 2018-02-11 DIAGNOSIS — E278 Other specified disorders of adrenal gland: Secondary | ICD-10-CM

## 2018-02-11 DIAGNOSIS — R911 Solitary pulmonary nodule: Secondary | ICD-10-CM

## 2018-02-11 DIAGNOSIS — I4891 Unspecified atrial fibrillation: Secondary | ICD-10-CM

## 2018-02-11 DIAGNOSIS — R590 Localized enlarged lymph nodes: Secondary | ICD-10-CM | POA: Diagnosis not present

## 2018-02-11 DIAGNOSIS — E279 Disorder of adrenal gland, unspecified: Secondary | ICD-10-CM

## 2018-02-11 DIAGNOSIS — D381 Neoplasm of uncertain behavior of trachea, bronchus and lung: Secondary | ICD-10-CM

## 2018-02-11 NOTE — Progress Notes (Unsigned)
C-

## 2018-02-11 NOTE — Progress Notes (Signed)
[address]       ,[place] [address]             [phone]                    [facility] [place] Medical Record #[id number] Date of Birth: [birth year]  Referring: Sanda Klein, MD Primary Care: Antony Contras, MD  Chief Complaint:    01/25/2016 OPERATIVE REPORT PREOPERATIVE DIAGNOSES: Right middle lobe lung mass and mediastinal adenopathy, hypermetabolic and enlarged right adrenal gland, hypermetabolic. POSTOPERATIVE DIAGNOSES: Right middle lobe lung mass and mediastinal adenopathy, hypermetabolic and enlarged right adrenal gland, hypermetabolic. PROCEDURE: Video bronchoscopy with navigation bronchoscopy to the right middle lobe lesion and EBUS with transbronchial biopsies of #7 and #4 R lymph nodes. SURGEON: Lanelle Bal, MD PATH: Diagnosis Lung, biopsy, right middle lobe - BENIGN LUNG TISSUE. - SEE COMMENT. Microscopic Comment Sections demonstrate degenerated benign lung tissue with scattered predominantly chronic inflammation. As sampled, there is a no atypia and mass lesional tissue is not identified. The findings are nonspecific. Please correlation with clinical and radiologic impression. (RAH:gt, 01/29/16) [name] Pathologist, Electronic Signature (Case signed 01/29/2016)   History of Present Illness:    James Sweeney 77 y.o. male is seen in the office  today after previously being seen for consideration of surgical resection of a right middle lobe lung mass that had been enlarging . The mass was present on CT of abdomen in June ,2016. Patient quit smoking 40-45 years ago. Repeat ct of chest showed right lung nodule grew 7mm.  . 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 In late March 2017  attempt at navigation bronchoscopy ebus was performed,no definitive pathologic diagnosis was made. The patient did have complication with right pneumothorax requiring chest tube placement.    After the  negative biopsy it was recommended recommended to the patient that we proceed with resection of the lesion in the right middle lobe. However after much discussion he canceled his surgery and has not returned until today. He did have a repeat CT scan in November 2017, done at the current emergency room after he fell out of a tree working on a deer stand. We were not notified the scan had been done and the patient was given no information about the lung nodule present on that scan.  Comes in today for follow up concerning the lung mass.  He has had no recent CT scans or follow-up as he had declined any further intervention until today.  Since last seen he has now developed atrial fibrillation and is on Xarelto.  Current Activity/ Functional Status:  Patient is independent with mobility/ambulation, transfers, ADL's, IADL's.   Zubrod Score: At the time of surgery this patient's most appropriate activity status/level should be described as: []     0    Normal activity, no symptoms [x]     1    Restricted in physical strenuous activity but ambulatory, able to do out light work []     2    Ambulatory and capable of self care, unable to do work activities, up and about               >50 % of waking hours                              []     3  Only limited self care, in bed greater than 50% of waking hours []     4    Completely disabled, no self care, confined to bed or chair []     5    Moribund   Past Medical History:  Diagnosis Date  . Cervical spondylosis without myelopathy 12/01/2013  . Diabetes mellitus    borderline  . Enlarged prostate   . Hearing deficit   . Heart murmur   . History of kidney stones    removed with Lithotripsy  . HOH (hard of hearing)    right ear  . Hyperlipemia   . Hypertension   . Lumbago 12/01/2013  . Pneumonia 2016  . Polio    right leg smaller than left  . Shortness of breath dyspnea    with exertion    Past Surgical History:  Procedure Laterality Date    . CATARACT EXTRACTION Bilateral 2014  . VIDEO BRONCHOSCOPY WITH ENDOBRONCHIAL NAVIGATION N/A 01/25/2016   Procedure: VIDEO BRONCHOSCOPY WITH ENDOBRONCHIAL NAVIGATION;  Surgeon: [name], MD;  Location: [facility];  Service: Thoracic;  Laterality: N/A;  . VIDEO BRONCHOSCOPY WITH ENDOBRONCHIAL ULTRASOUND N/A 01/25/2016   Procedure: VIDEO BRONCHOSCOPY WITH ENDOBRONCHIAL ULTRASOUND;  Surgeon: [name], MD;  Location: [facility];  Service: Thoracic;  Laterality: N/A;    Family History  Problem Relation Age of Onset  . Cancer Mother        Stomach  . Cancer - Lung Father   . Heart attack Brother   . Diabetes Brother     Social History   Socioeconomic History  . Marital status: Married    Spouse name: Not on file  . Number of children: 2  . Years of education: Not on file  . Highest education level: Not on file  Occupational History  . Not on file  Social Needs  . Financial resource strain: Not on file  . Food insecurity:    Worry: Not on file    Inability: Not on file  . Transportation needs:    Medical: Not on file    Non-medical: Not on file  Tobacco Use  . Smoking status: Former Smoker    Packs/day: 0.50    Years: 9.00    Pack years: 4.50    Types: Cigarettes    Last attempt to quit: 12/02/1967    Years since quitting: 50.2  . Smokeless tobacco: Current User    Types: Chew  Substance and Sexual Activity  . Alcohol use: No  . Drug use: No  . Sexual activity: Never  Lifestyle  . Physical activity:    Days per week: Not on file    Minutes per session: Not on file  . Stress: Not on file  Relationships  . Social connections:    Talks on phone: Not on file    Gets together: Not on file    Attends religious service: Not on file    Active member of club or organization: Not on file    Attends meetings of clubs or organizations: Not on file    Relationship status: Not on file  . Intimate partner violence:    Fear of current or ex partner: Not on file     Emotionally abused: Not on file    Physically abused: Not on file    Forced sexual activity: Not on file  Other Topics Concern  . Not on file  Social History Narrative   Patient is right handed.   Patient drinks 1 cup  caffeine daily.      Pt has been married for 24 years and has 2 children, 4 grandchildren, and 3 great-grandchildren. Lives with everyone except great-grandchildren. He does not clean house, but can shop, do yard work, and drive. 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         Epworth Sleepiness Scale Score:  8      --I have HTN   --I seem to be losing my sex drive   --I wake up to urinate frequently at night   --I awake feeling not rested   --I have borderline diabetes    Social History   Tobacco Use  Smoking Status Former Smoker  . Packs/day: 0.50  . Years: 9.00  . Pack years: 4.50  . Types: Cigarettes  . Last attempt to quit: 12/02/1967  . Years since quitting: 50.2  Smokeless Tobacco Current User  . Types: Chew    Social History   Substance and Sexual Activity  Alcohol Use No     Allergies  Allergen Reactions  . Levaquin [Levofloxacin In D5w] Nausea Only  . Penicillins Hives and Other (See Comments)    Has patient had a PCN reaction causing immediate rash, facial/tongue/throat swelling, SOB or lightheadedness with hypotension: unknown, childhood allergy Has patient had a PCN reaction causing severe rash involving mucus membranes or skin necrosis: No Has patient had a PCN reaction that required hospitalization No Has patient had a PCN reaction occurring within the last 10 years: No If all of the above answers are "NO", then may proceed with Cephalosporin use.     Current Outpatient Medications  Medication Sig Dispense Refill  . alfuzosin (UROXATRAL) 10 MG 24 hr tablet Take 10 mg by mouth daily.    [name] amLODipine (NORVASC) 10 MG tablet Take 10 mg by mouth daily.    . Ascorbic Acid (VITAMIN C) 100 MG tablet Take 1,000 mg by  mouth daily.     . Cyanocobalamin (VITAMIN B-12) 5000 MCG SUBL Place 1 tablet under the tongue daily.    . finasteride (PROSCAR) 5 MG tablet Take 5 mg by mouth daily.    [name] lisinopril-hydrochlorothiazide (PRINZIDE,ZESTORETIC) 20-25 MG per tablet Take 1 tablet by mouth daily.  1  . Omega-3 Fatty Acids (FISH OIL) 1000 MG CAPS Take 1,000 mg by mouth daily.    . rivaroxaban (XARELTO) 20 MG TABS tablet Take 1 tablet (20 mg total) by mouth daily with supper. 30 tablet 30  . simvastatin (ZOCOR) 20 MG tablet Take 20 mg by mouth every evening.    . traMADol (ULTRAM) 50 MG tablet Take 1 tablet (50 mg total) by mouth every 6 (six) hours as needed. (Patient taking differently: Take 50 mg by mouth every 6 (six) hours as needed for moderate pain. ) 30 tablet 0  . vitamin E 400 UNIT capsule Take 400 Units by mouth daily.     No current facility-administered medications for this visit.       Review of Systems:     Cardiac Review of Systems: Y or N  Chest Pain [   n ]  Resting SOB [n   ] Exertional SOB  [ y ]  Orthopnea [ n ]   Pedal Edema [ n  ]    Palpitations [ n ] Syncope  [n]   Presyncope [n   ]  General Review of Systems: [Y] = yes [  ]=no Constitional: recent weight change [  ];  Wt loss over  the last 3 months [   ] anorexia [  ]; fatigue [  ]; nausea [  ]; night sweats [  ]; fever [  ]; or chills [  ];          Dental: poor dentition[  ]; Last Dentist visit:   Eye : blurred vision [  ]; diplopia [   ]; vision changes [  ];  Amaurosis fugax[  ]; Resp: cough [  ];  wheezing[ n ];  hemoptysis[n  ]; shortness of breath[  ]; paroxysmal nocturnal dyspnea[  ]; dyspnea on exertion[ y ]; or orthopnea[  ];  GI:  gallstones[  ], vomiting[  ];  dysphagia[  ]; melena[  ];  hematochezia [  ]; heartburn[  ];   Hx of  Colonoscopy[  ]; GU: kidney stones [  ]; hematuria[  ];   dysuria [  ];  nocturia[  ];  history of     obstruction [  ]; urinary frequency [  ]             Skin: rash, swelling[  ];, hair loss[  ];   peripheral edema[  ];  or itching[  ]; Musculosketetal: myalgias[  ];  joint swelling[  ];  joint erythema[  ];  joint pain[  ];  back pain[  ];  Heme/Lymph: bruising[  ];  bleeding[  ];  anemia[  ];  Neuro: TIA[ n ];  headaches[  ];  stroke[  ];  vertigo[  ];  seizures[  ];   paresthesias[  ];  difficulty walking[  ];  Psych:depression[  ]; anxiety[  ];  Endocrine: diabetes[  ];  thyroid dysfunction[  ];  Immunizations: Flu up to date [  ]; Pneumococcal up to date [  ];  Other:  Physical Exam: BP 127/63 (BP Location: Right Arm, Patient Position: Sitting, Cuff Size: Large)   Pulse 76   Resp 16   Ht 5\' 10"  (1.778 m)   Wt 191 lb (86.6 kg)   SpO2 98% Comment: ON RA  BMI 27.41 kg/m   PHYSICAL EXAMINATION: General appearance: alert, cooperative and appears older than stated age Head: Normocephalic, without obvious abnormality, atraumatic Neck: no adenopathy, no carotid bruit, no JVD, supple, symmetrical, trachea midline and thyroid not enlarged, symmetric, no tenderness/mass/nodules Lymph nodes: Cervical, supraclavicular, and axillary nodes normal. Resp: clear to auscultation bilaterally Back: symmetric, no curvature. ROM normal. No CVA tenderness. Cardio: regular rate and rhythm, S1, S2 normal, no murmur, click, rub or gallop GI: soft, non-tender; bowel sounds normal; no masses,  no organomegaly Extremities: extremities normal, a irregular rate consistent with atrial fibrillation traumatic, no cyanosis or edema and Homans sign is negative, no sign of DVT Neurologic: Grossly normal  Diagnostic Studies & Laboratory data:     Recent Radiology Findings:  Nm Pet Image Restag (ps) Skull Base To Thigh  Result Date: 12/30/2016 CLINICAL DATA:  Initial treatment strategy for lung nodule and adrenal mass. EXAM: NUCLEAR MEDICINE PET SKULL BASE TO THIGH TECHNIQUE: 13.0 mCi F-18 FDG was injected intravenously. Full-ring PET imaging was performed from the skull base to thigh after the radiotracer.  CT data was obtained and used for attenuation correction and anatomic localization. FASTING BLOOD GLUCOSE:  Value: 105 mg/dl COMPARISON:  Multiple exams, including 09/18/2016 CT scan FINDINGS: NECK No hypermetabolic lymph nodes in the neck. CHEST 1.8 cm right middle lobe pulmonary nodule has maximum standard uptake value 3.4, this has increased in size from 1.2 cm on 01/01/2016  and increased in maximum SUV from prior measurement of 2.7. Faintly accentuated activity in the right hilum is likely from a small hilar node, maximum SUV 5.6. A faintly hypermetabolic subcarinal node 1.4 cm in short axis on image 97/3, maximum SUV 4.7. Su faintly hypermetabolic left infrahilar region, but with maximum SUV of only 4.2. There some calcified mediastinal lymph nodes compatible with old granulomatous disease. Partially calcified 5 mm right upper lobe subpleural nodule on image 78/3. Ill-defined anterior right upper lobe subpleural density on image 93/3, possibly postinflammatory, with only vague metabolic activity. Small lipoma deep to the left serratus anterior muscle without hypermetabolic activity. Coronary, aortic arch, and branch vessel atherosclerotic vascular disease. ABDOMEN/PELVIS The right adrenal nodule measures 3.0 by 2.0 cm on image 154/3 (previously 2.1 by 2.4 cm on 01/01/2016) and has an internal density of 30 Hounsfield units with maximum SUV of 5.2 (Previously 5.9 on prior PET-CT). The contralateral adrenal gland has a maximum SUV of 4.4, for comparison purposes. Enlarged prostate gland without hypermetabolic activity. Hypodense but not hypermetabolic lesions in segment 4 and segment 3 of the liver, likely cysts. Fullness of the left collecting system without hydroureter. Aortoiliac atherosclerotic vascular disease. There is a lipoma deep to the left gluteus maximus muscle which is probably exerting some mass effect on the left sciatic nerve. SKELETON No focal hypermetabolic activity to suggest skeletal  metastasis. IMPRESSION: 1. Slow enlargement of the mildly hypermetabolic right middle lobe nodule, suspicious for low-grade malignancy such as bronchial carcinoid tumor. There are also several small faintly hypermetabolic lymph nodes in the chest as noted above. Another possibility in this case is low grade smoldering granulomatous disease. Tissue diagnosis of the right middle lobe lesion should be considered. 2. Enlarging right adrenal nodule is minimally asymmetrically hypermetabolic, but was previously more hypermetabolic on the prior PET-CT of 2017. This has enlarged mildly since the prior PET-CT, and probably merits surveillance. 3. Other imaging findings of potential clinical significance: Enlarged prostate gland. Coronary, aortic arch, and branch vessel atherosclerotic vascular disease. Lipoma posterior to the left gluteus maximus muscle, displacing the left sciatic nerve anteriorly; there is also a lipoma deep to the left serratus anterior muscle and along the left external oblique muscle. Electronically Signed   By: Van Clines M.D.   On: 12/30/2016 13:52        CLINICAL DATA:  Status post fall 12 feet from deer stand onto back. Patient on blood thinners. Lower back pain, acute onset. Initial encounter.  EXAM: CT CHEST, ABDOMEN, AND PELVIS WITH CONTRAST  TECHNIQUE: Multidetector CT imaging of the chest, abdomen and pelvis was performed following the standard protocol during bolus administration of intravenous contrast.  CONTRAST:  192mL ISOVUE-300 IOPAMIDOL (ISOVUE-300) INJECTION 61%  COMPARISON:  CT of the abdomen and pelvis from 02/29/2016, and CT of the chest performed 04/17/2016  FINDINGS: CT CHEST FINDINGS  Cardiovascular: Diffuse coronary artery calcifications are seen. The heart is borderline normal in size. Scattered calcification is noted along the thoracic aorta. The great vessels are grossly unremarkable in appearance. There is no evidence of aortic  injury. No venous hemorrhage is seen.  Mediastinum/Nodes: A 1.2 cm node is noted at the subcarinal region. A mildly calcified 8 mm node is noted in the periaortic region. No pericardial effusion is identified. The visualized portions of the thyroid gland are unremarkable. No axillary lymphadenopathy is seen.  Lungs/Pleura: Mild bibasilar atelectasis is noted. New peripheral opacity at the right upper lobe could reflect a mild infectious or inflammatory process. A 1.7  x 1.4 cm nodule is noted at the right middle lobe, mildly increased in size from the prior study and suspicious for primary bronchogenic malignancy. No pleural effusion or pneumothorax is seen. There is no evidence of pulmonary parenchymal contusion.  Musculoskeletal: No acute osseous abnormalities are identified. Minimal nonspecific foci of high density are noted along the right fourth anterolateral rib, likely benign in nature. The visualized musculature is unremarkable in appearance.  CT ABDOMEN PELVIS FINDINGS  Hepatobiliary: Scattered nonspecific hypodensities are noted within the liver, measuring up to 1.6 cm in size. The gallbladder is unremarkable in appearance. The common bile duct remains normal in caliber.  Pancreas: The pancreas is unremarkable in appearance.  Spleen: The spleen is within normal limits.  Adrenals/Urinary Tract: A 3.5 x 3.0 cm right adrenal lesion is noted, with centrally decreased attenuation. The left adrenal gland is unremarkable.  Prominent bilateral renal parapelvic cysts are noted. Nonspecific perinephric stranding is noted bilaterally. There is no evidence of hydronephrosis. No renal or ureteral stones are identified.  Stomach/Bowel: The stomach is unremarkable in appearance. The small bowel is within normal limits. The appendix is normal in caliber, without evidence of appendicitis. The colon is unremarkable in appearance.  Vascular/Lymphatic: Diffuse  calcification is seen along the abdominal aorta and its branches. The abdominal aorta is otherwise grossly unremarkable. The inferior vena cava is grossly unremarkable. No retroperitoneal lymphadenopathy is seen. No pelvic sidewall lymphadenopathy is identified.  Reproductive: The bladder is mildly distended and grossly unremarkable. The prostate is enlarged, measuring 5.5 cm in transverse dimension, with impression on the base of the bladder.  Other: No free air or free fluid is seen within the abdomen or pelvis. There is no evidence of solid or hollow organ injury.  Musculoskeletal: There is a mild compression fracture involving the anterior superior endplate of L1, with only minimal loss of height. There is no evidence of retropulsion or extension to the posterior elements. The visualized musculature is unremarkable in appearance.  IMPRESSION: 1. Mild compression fracture involving the anterior superior endplate of L1, with only minimal loss of height. No evidence of retropulsion or extension to the posterior elements. 2. No additional evidence for traumatic injury to the chest, abdomen or pelvis. 3. 1.7 x 1.4 cm nodule at the right middle lung lobe has mildly increased in size from the prior study and is suspicious for enlarging primary bronchogenic carcinoma. Tissue diagnosis is recommended. 4. New peripheral opacity in the right upper lung lobe could reflect a mild infectious or inflammatory process. 5. 1.2 cm subcarinal node noted, of uncertain significance. 6. Diffuse coronary artery calcifications seen. 7. Small bibasilar atelectasis noted. 8. 3.5 x 3.0 cm right adrenal lesion noted, with centrally decreased attenuation. This has increased in size from prior studies, and malignancy cannot be excluded. Would correlate with adrenal lab values. Adrenal protocol MRI or CT is recommended for further evaluation. 9. Scattered nonspecific hypodensities within the liver,  measuring up to 1.6 cm in size. 10. Diffuse aortic atherosclerosis. 11. Enlarged prostate noted, with impression on the base of the bladder. Would correlate with PSA. 12. Prominent bilateral renal parapelvic cysts noted. These results were called by telephone at the time of interpretation on 09/18/2016 at 9:34 pm to the ER clinical team, who verbally acknowledged these results.   Electronically Signed   By: Garald Balding M.D.   On: 09/18/2016 21:42  Ct Chest Wo Contrast  04/17/2016  CLINICAL DATA:  Followup lung nodule EXAM: CT CHEST WITHOUT CONTRAST TECHNIQUE: Multidetector CT imaging  of the chest was performed following the standard protocol without IV contrast. COMPARISON:  12/12/2015 FINDINGS: Mediastinum/Lymph Nodes: Calcified mediastinal lymph nodes are again noted and appears similar to previous exam. Aortic atherosclerosis. Calcification within the RCA, LAD coronary artery. The trachea appears patent and is midline. Normal appearance of the esophagus. Lungs/Pleura: No pulmonary mass, infiltrate, or effusion. Pulmonary nodule in the right middle lobe measures 1.6 x 1.1 cm, image 84 of series 4. Previously this measured 1.2 x 1.0 cm peer on PET-CT from 01/01/2016 this exhibited malignant range FDG uptake. Calcified granuloma noted within the posterior right upper lobe. Upper abdomen: No acute findings. Stable appearance of right adrenal nodule measuring 2.5 cm, image 131 of series 3. Similar appearance of liver cysts. No acute findings identified within the upper abdomen. Musculoskeletal: Spondylosis noted within the thoracic spine. No aggressive lytic or sclerotic bone lesions identified. IMPRESSION: 1. Increase in size of right middle lobe lung nodule. This remains suspicious for primary bronchogenic carcinoma. 2. Similar appearance of calcified mediastinal nodes. 3. No change in 2.4 cm right adrenal nodule. 4. Aortic atherosclerosis and multi vessel coronary artery calcification.  Electronically Signed   By: Kerby Moors M.D.   On: 04/17/2016 10:12    Nm Pet Image Initial (pi) Skull Base To Thigh  01/01/2016  CLINICAL DATA:  Initial treatment strategy for pulmonary nodule. EXAM: NUCLEAR MEDICINE PET SKULL BASE TO THIGH TECHNIQUE: 9.83 mCi F-18 FDG was injected intravenously. Full-ring PET imaging was performed from the skull base to thigh after the radiotracer. CT data was obtained and used for attenuation correction and anatomic localization. FASTING BLOOD GLUCOSE:  Value: 118 mg/dl COMPARISON:  CT chest dated 12/12/2015. CT abdomen pelvis dated 04/16/2015. FINDINGS: NECK No hypermetabolic lymph nodes in the neck. CHEST 12 x 10 mm right middle lobe nodule (series 6/ image 49), max SUV 2.7, suspicious for primary bronchogenic neoplasm. Mild subpleural nodularity in the posterior right upper lobe (series 6/image 19). No focal consolidation. No pleural effusion or pneumothorax. The heart is normal in size. Coronary atherosclerosis in the LAD and right coronary artery. Atherosclerotic calcifications aortic arch. Thoracic lymphadenopathy, including: --10 mm short axis AP window node (series 4/image 66), max SUV 5.7 --11 mm short axis partially calcified prevascular node (series 4/image 68) --12 mm short axis right hilar node (series 4/image 70), max SUV 6.5 --13 mm short axis partially calcified subcarinal node (series 4/image 32), max SUV 6.9 --11 mm short axis left hilar node (series 4/image 32), max SUV 6.6 Visualized right thyroid is notable for an 18 x 10 mm right thyroid nodule (series 4/image 45). ABDOMEN/PELVIS No abnormal hypermetabolic activity within the liver, pancreas, or spleen. 2.1 x 2.4 cm right adrenal nodule, unchanged from prior 2016 CT abdomen pelvis, max SUV 5.9. Left renal sinus cysts. Atherosclerotic calcification of the abdominal aorta and branch vessels. Prostatomegaly, with enlargement of the central gland which indents the base the bladder. Bladder is mildly  thick-walled although underdistended. Tiny fat containing bilateral inguinal hernias. No hypermetabolic lymph nodes in the abdomen or pelvis. SKELETON No focal hypermetabolic activity to suggest skeletal metastasis. IMPRESSION: 12 x 10 mm hypermetabolic right middle lobe nodule, suspicious for primary bronchogenic neoplasm. Hypermetabolic thoracic lymphadenopathy, most of which is partially calcified, favored to reflect granulomatous disease such as sarcoidosis. Nodal metastases is considered less likely. 2.1 x 2.4 cm hypermetabolic right adrenal nodule, unchanged from June 2016. While technically indeterminate, metastasis not excluded, stability favors a benign etiology such as an adrenal adenoma. Electronically Signed  By: Julian Hy M.D.   On: 01/01/2016 10:47  I have independently reviewed the above radiology studies  and reviewed the findings with the patient.  Recent Lab Findings: Lab Results  Component Value Date   WBC 5.1 10/25/2017   HGB 12.4 (L) 10/25/2017   HCT 36.1 (L) 10/25/2017   PLT 127 (L) 10/25/2017   GLUCOSE 140 (H) 10/25/2017   ALT 23 01/25/2016   AST 18 01/25/2016   NA 137 10/25/2017   K 3.6 10/25/2017   CL 105 10/25/2017   CREATININE 1.20 10/25/2017   BUN 15 10/25/2017   CO2 25 10/25/2017   INR 1.07 01/25/2016   Chronic Kidney Disease   Stage I     GFR >90  Stage II    GFR 60-89  Stage IIIA GFR 45-59  Stage IIIB GFR 30-44  Stage IV   GFR 15-29  Stage V    GFR  <15  Lab Results  Component Value Date   CREATININE 1.20 10/25/2017   CrCl cannot be calculated (Patient's most recent lab result is older than the maximum 21 days allowed.).  PFT's: FEV1 3.07 100% Dlco 25  79%  Conclusions: The diffusion defect is consistent with a pulmonary vascular process. Anemia cannot be excluded as a potential cause of the diffusion defect without correcting the observed diffusing capacity for hemoglobin. Pulmonary Function Diagnosis: Minimal Diffusion Defect  -Pulmonary Vascular  ECHO: LV EF: 65% - 70%  ------------------------------------------------------------------- History: PMH: Dyspnea. Risk factors: Smokless tabacco user. Former tobacco use. Hypertension. Diabetes mellitus. Dyslipidemia.  ------------------------------------------------------------------- Study Conclusions  - Left ventricle: The cavity size was normal. There was mild  concentric hypertrophy. Systolic function was vigorous. The  estimated ejection fraction was in the range of 65% to 70%. Wall  motion was normal; there were no regional wall motion  abnormalities. Doppler parameters are consistent with abnormal  left ventricular relaxation (grade 1 diastolic dysfunction).  There was no evidence of elevated ventricular filling pressure by  Doppler parameters. - Aortic valve: Trileaflet; normal thickness leaflets. Valve area  (Vmax): 1.58 cm^2. - Aortic root: The aortic root was normal in size. - Left atrium: The atrium was mildly dilated. - Right ventricle: Systolic function was normal. - Right atrium: The atrium was normal in size. - Tricuspid valve: There was trivial regurgitation. - Pulmonary arteries: Systolic pressure was within the normal  range. - Inferior vena cava: The vessel was normal in size. - Pericardium, extracardiac: There was no pericardial effusion.  Transthoracic echocardiography. M-mode, complete 2D, spectral Doppler, and color Doppler. Birthdate: Patient birthdate: 12-06-1940. Age: Patient is 77 yr old. Sex: Gender: male. BMI: 27.8 kg/m^2. Blood pressure: 126/50 Patient status: Outpatient. Study date: Study date: 01/23/2016. Study time: 10:07 AM.  -------------------------------------------------------------------  ------------------------------------------------------------------- Left ventricle: The cavity size was normal. There was mild concentric hypertrophy. Systolic function was vigorous.  The estimated ejection fraction was in the range of 65% to 70%. Wall motion was normal; there were no regional wall motion abnormalities. The transmitral flow pattern was normal. The deceleration time of the early transmitral flow velocity was normal. The pulmonary vein flow pattern was normal. The tissue Doppler parameters were normal. Doppler parameters are consistent with abnormal left ventricular relaxation (grade 1 diastolic dysfunction). There was no evidence of elevated ventricular filling pressure by Doppler parameters.  ------------------------------------------------------------------- Aortic valve: Trileaflet; normal thickness leaflets. Mobility was not restricted. Sclerosis without stenosis. Doppler: Transvalvular velocity was within the normal range. There was no stenosis. There was no regurgitation. Peak velocity ratio  of LVOT to aortic valve: 0.62. Valve area (Vmax): 1.58 cm^2. Indexed valve area (Vmax): 0.75 cm^2/m^2. Peak gradient (S): 9 mm Hg.  ------------------------------------------------------------------- Aorta: Aortic root: The aortic root was normal in size.  ------------------------------------------------------------------- Mitral valve: Structurally normal valve. Mobility was not restricted. Doppler: Transvalvular velocity was within the normal range. There was no evidence for stenosis. There was no regurgitation. Peak gradient (D): 2 mm Hg.  ------------------------------------------------------------------- Left atrium: The atrium was mildly dilated.  ------------------------------------------------------------------- Right ventricle: The cavity size was normal. Wall thickness was normal. Systolic function was normal.  ------------------------------------------------------------------- Pulmonic valve: Doppler: Transvalvular velocity was within the normal range. There was no evidence for  stenosis.  ------------------------------------------------------------------- Tricuspid valve: Structurally normal valve. Doppler: Transvalvular velocity was within the normal range. There was trivial regurgitation.  ------------------------------------------------------------------- Pulmonary artery: The main pulmonary artery was normal-sized. Systolic pressure was within the normal range.  ------------------------------------------------------------------- Right atrium: The atrium was normal in size.  ------------------------------------------------------------------- Pericardium: There was no pericardial effusion.  ------------------------------------------------------------------- Systemic veins: Inferior vena cava: The vessel was normal in size.  ------------------------------------------------------------------- Measurements  Left ventricle Value Reference LV ID, ED, PLAX chordal 44.7 mm 43 - 52 LV ID, ES, PLAX chordal 28.9 mm 23 - 38 LV fx shortening, PLAX chordal 35 % >=29 LV PW thickness, ED 12.9 mm --------- IVS/LV PW ratio, ED 1.02 <=1.3 Stroke volume, 2D 64 ml --------- Stroke volume/bsa, 2D 30 ml/m^2 --------- LV ejection fraction, 1-p A4C 57 % --------- LV end-diastolic volume, 2-p 841 ml --------- LV end-systolic volume, 2-p 52 ml --------- LV ejection fraction, 2-p 60 % --------- Stroke volume, 2-p 78 ml --------- LV end-diastolic volume/bsa, 2-p 62 ml/m^2 --------- LV end-systolic volume/bsa, 2-p 25 ml/m^2 --------- Stroke volume/bsa, 2-p  37.1 ml/m^2 --------- LV e&', lateral 8.09 cm/s --------- LV E/e&', lateral 9.38 --------- LV s&', lateral 16 cm/s --------- LV e&', medial 8.29 cm/s --------- LV E/e&', medial 9.16 --------- LV e&', average 8.19 cm/s --------- LV E/e&', average 9.27 ---------  Ventricular septum Value Reference IVS thickness, ED 13.1 mm ---------  LVOT Value Reference LVOT ID, S 18 mm --------- LVOT area 2.54 cm^2 --------- LVOT peak velocity, S 93.1 cm/s --------- LVOT mean velocity, S 67 cm/s --------- LVOT VTI, S 25.2 cm ---------  Aortic valve Value Reference Aortic valve peak velocity, S 150 cm/s --------- Aortic peak gradient, S 9 mm Hg --------- Velocity ratio, peak, LVOT/AV 0.62 --------- Aortic valve area, peak velocity 1.58 cm^2 --------- Aortic valve area/bsa, peak 0.75 cm^2/m^2 --------- velocity  Aorta Value Reference Aortic root ID, ED 31 mm ---------  Left atrium Value Reference LA ID, A-P, ES 42 mm --------- LA ID/bsa, A-P 2 cm/m^2 <=2.2 LA volume, S 44 ml --------- LA volume/bsa,  S 20.9 ml/m^2 --------- LA volume, ES, 1-p A4C 44 ml --------- LA volume/bsa, ES, 1-p A4C 20.9 ml/m^2 --------- LA volume, ES, 1-p A2C 42 ml --------- LA volume/bsa, ES, 1-p A2C 20 ml/m^2 ---------  Mitral valve Value Reference Mitral E-wave peak velocity 75.9 cm/s --------- Mitral A-wave peak velocity 103 cm/s --------- Mitral deceleration time (H) 306 ms 150 - 230 Mitral peak gradient, D 2 mm Hg --------- Mitral E/A ratio, peak 0.8 --------- Mitral maximal regurg velocity, 341 cm/s --------- PISA  Pulmonary arteries Value Reference PA pressure, S, DP 20 mm Hg <=30  Tricuspid valve Value Reference Tricuspid regurg peak velocity 204 cm/s --------- Tricuspid peak RV-RA gradient 17 mm Hg ---------  Right ventricle Value Reference RV ID, ED, PLAX 30.7 mm 19 - 38  Pulmonic  valve Value Reference Pulmonic valve peak velocity, S 80.2 cm/s ---------  Legend: (L) and (H) mark values outside specified reference range.  ------------------------------------------------------------------- Prepared and Electronically Authenticated by  Ena Dawley, M.D. 2017-03-22T12:44:23  Notes Recorded by Sanda Klein, MD on 01/23/2016 at 2:20 PM Both the stress test and the cho are low risk. The aortic valve is sclerotic, but not stenotic. The LVEF is normal. OK to proceed with surgical procedure with Dr. Servando Snare.        Assessment / Plan:   Right middle lobe 1 cm lung nodule enlarging with hypermetabolic right adrenal lesion and hypermetabolic bilateral hilar nodes- without definitive tissue diagnosis. Results of the PET scan were reviewed with the patient and his wife . I discussed with the patient and his wife proceeding with surgical resection of the lesion in the right middle lobe.  Initially the patient t was agreeable with proceeding with surgery but would like to go home and check the calendar for the signs if it would be okay to proceed next week.  After consideration he called back and cancel his surgery and never came for follow-up appointment till today.  After reviewing his scans with him I recommended that we obtain a repeat CT scan in the next 1-2 weeks to evaluate the current status of the right lung nodule. Since he is now on Xarelto this will need to be held before any surgical intervention   [name] MD      [address] [place] [address] Office [phone]   Beeper [phone]  02/11/2018 3:34 PM

## 2018-02-12 NOTE — Progress Notes (Signed)
If he does agree with surgery, holding the Xarelto temporarily would come with an acceptably low embolic risk MCr

## 2018-02-16 ENCOUNTER — Telehealth: Payer: Self-pay | Admitting: *Deleted

## 2018-02-16 NOTE — Telephone Encounter (Signed)
Patient here because he was told to take xarelto for 30 days and then come to the office. Samples of xarelto given to the patient and he is aware of his follow up appointment in June. He understands to continue the xarelto until seen.

## 2018-02-18 ENCOUNTER — Other Ambulatory Visit: Payer: Self-pay

## 2018-02-18 ENCOUNTER — Encounter: Payer: Self-pay | Admitting: Cardiothoracic Surgery

## 2018-02-18 ENCOUNTER — Other Ambulatory Visit: Payer: Self-pay | Admitting: *Deleted

## 2018-02-18 ENCOUNTER — Ambulatory Visit
Admission: RE | Admit: 2018-02-18 | Discharge: 2018-02-18 | Disposition: A | Discharge status: Home / Self Care | Payer: Medicare Other | Source: Ambulatory Visit | Attending: Cardiothoracic Surgery | Admitting: Cardiothoracic Surgery

## 2018-02-18 ENCOUNTER — Ambulatory Visit (INDEPENDENT_AMBULATORY_CARE_PROVIDER_SITE_OTHER): Payer: Medicare Other | Admitting: Cardiothoracic Surgery

## 2018-02-18 VITALS — BP 121/59 | HR 80 | Resp 18 | Ht 70.0 in | Wt 190.0 lb

## 2018-02-18 DIAGNOSIS — R911 Solitary pulmonary nodule: Secondary | ICD-10-CM

## 2018-02-18 DIAGNOSIS — R918 Other nonspecific abnormal finding of lung field: Secondary | ICD-10-CM

## 2018-02-18 NOTE — Progress Notes (Signed)
[facility] [address]       Sweeney,James Sweeney             [phone]                    James Sweeney Medical Record #[id number] Date of Birth: Jun 01, 1941  Referring: James Klein, MD Primary Care: James Contras, MD  Chief Complaint:    Enlarging right lung mass  History of Present Illness:    James Sweeney 77 y.o. male is seen in the office  today after previously being seen for consideration of surgical resection of a right middle lobe lung mass that had been enlarging . The mass was present on CT of abdomen in June ,2016. Patient quit smoking 40-45 years ago. Repeat ct of chest showed right lung nodule grew 82mm.  . 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 In late March 2017  attempt at navigation bronchoscopy ebus was performed,no definitive pathologic diagnosis was made. The patient did have complication with right pneumothorax requiring chest tube placement.    After the negative biopsy it was recommended recommended to the patient that we proceed with resection of the lesion in the right middle lobe. However after much discussion he canceled his surgery and has not returned past several weeks. [name] He did have a repeat CT scan in November 2017, done at the current emergency room after he fell out of a tree working on a deer stand. We were not notified the scan had been done and the patient was given no information about the lung nodule present on that scan.  Comes in today for follow up concerning the lung mass with repeat ct of chest .    Since last seen he has now developed atrial fibrillation and is on Xarelto.   Previous surgery: 01/25/2016 OPERATIVE REPORT PREOPERATIVE DIAGNOSES: Right middle lobe lung mass and mediastinal adenopathy, hypermetabolic and enlarged right adrenal gland, hypermetabolic. POSTOPERATIVE DIAGNOSES: Right middle lobe lung mass and mediastinal adenopathy, hypermetabolic and enlarged right  adrenal gland, hypermetabolic. PROCEDURE: Video bronchoscopy with navigation bronchoscopy to the right middle lobe lesion and EBUS with transbronchial biopsies of #7 and #4 R lymph nodes. SURGEON: Lanelle Bal, MD PATH: Diagnosis Lung, biopsy, right middle lobe - BENIGN LUNG TISSUE. - SEE COMMENT. Microscopic Comment Sections demonstrate degenerated benign lung tissue with scattered predominantly chronic inflammation. As sampled, there is a no atypia and mass lesional tissue is not identified. The findings are nonspecific. Please correlation with clinical and radiologic impression. (RAH:gt, 01/29/16) [name] Pathologist, Electronic Signature (Case signed 01/29/2016)   Current Activity/ Functional Status:  Patient is independent with mobility/ambulation, transfers, ADL's, IADL's.   Zubrod Score: At the time of surgery this patient's most appropriate activity status/level should be described as: []     0    Normal activity, no symptoms [x]     1    Restricted in physical strenuous activity but ambulatory, able to do out light work []     2    Ambulatory and capable of self care, unable to do work activities, up and about               >50 % of waking hours                              []     3    Only  limited self care, in bed greater than 50% of waking hours []     4    Completely disabled, no self care, confined to bed or chair []     5    Moribund   Past Medical History:  Diagnosis Date  . Cervical spondylosis without myelopathy 12/01/2013  . Diabetes mellitus    borderline  . Enlarged prostate   . Hearing deficit   . Heart murmur   . History of kidney stones    removed with Lithotripsy  . HOH (hard of hearing)    right ear  . Hyperlipemia   . Hypertension   . Lumbago 12/01/2013  . Pneumonia 2016  . Polio    right leg smaller than left  . Shortness of breath dyspnea    with exertion    Past Surgical History:  Procedure Laterality Date  . CATARACT  EXTRACTION Bilateral 2014  . VIDEO BRONCHOSCOPY WITH ENDOBRONCHIAL NAVIGATION N/A 01/25/2016   Procedure: VIDEO BRONCHOSCOPY WITH ENDOBRONCHIAL NAVIGATION;  Surgeon: [name], MD;  Location: [facility];  Service: Thoracic;  Laterality: N/A;  . VIDEO BRONCHOSCOPY WITH ENDOBRONCHIAL ULTRASOUND N/A 01/25/2016   Procedure: VIDEO BRONCHOSCOPY WITH ENDOBRONCHIAL ULTRASOUND;  Surgeon: [name], MD;  Location: [facility];  Service: Thoracic;  Laterality: N/A;    Family History  Problem Relation Age of Onset  . Cancer Mother        Stomach  . Cancer - Lung Father   . Heart attack Brother   . Diabetes Brother     Social History   Socioeconomic History  . Marital status: Married    Spouse name: Not on file  . Number of children: 2  . Years of education: Not on file  . Highest education level: Not on file  Occupational History  . Not on file  Social Needs  . Financial resource strain: Not on file  . Food insecurity:    Worry: Not on file    Inability: Not on file  . Transportation needs:    Medical: Not on file    Non-medical: Not on file  Tobacco Use  . Smoking status: Former Smoker    Packs/day: 0.50    Years: 9.00    Pack years: 4.50    Types: Cigarettes    Last attempt to quit: 12/02/1967    Years since quitting: 50.2  . Smokeless tobacco: Current User    Types: Chew  Substance and Sexual Activity  . Alcohol use: No  . Drug use: No  . Sexual activity: Never  Lifestyle  . Physical activity:    Days per week: Not on file    Minutes per session: Not on file  . Stress: Not on file  Relationships  . Social connections:    Talks on phone: Not on file    Gets together: Not on file    Attends religious service: Not on file    Active member of club or organization: Not on file    Attends meetings of clubs or organizations: Not on file    Relationship status: Not on file  . Intimate partner violence:    Fear of current or ex partner: Not on file    Emotionally  abused: Not on file    Physically abused: Not on file    Forced sexual activity: Not on file  Other Topics Concern  . Not on file  Social History Narrative   Patient is right handed.   Patient drinks 1 cup caffeine daily.  Pt has been married for 31 years and has 2 children, 4 grandchildren, and 3 great-grandchildren. Lives with everyone except great-grandchildren. He does not clean house, but can shop, do yard work, and drive. 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         Epworth Sleepiness Scale Score:  8      --I have HTN   --I seem to be losing my sex drive   --I wake up to urinate frequently at night   --I awake feeling not rested   --I have borderline diabetes    Social History   Tobacco Use  Smoking Status Former Smoker  . Packs/day: 0.50  . Years: 9.00  . Pack years: 4.50  . Types: Cigarettes  . Last attempt to quit: 12/02/1967  . Years since quitting: 50.2  Smokeless Tobacco Current User  . Types: Chew    Social History   Substance and Sexual Activity  Alcohol Use No     Allergies  Allergen Reactions  . Levaquin [Levofloxacin In D5w] Nausea Only  . Penicillins Hives and Other (See Comments)    Has patient had a PCN reaction causing immediate rash, facial/tongue/throat swelling, SOB or lightheadedness with hypotension: unknown, childhood allergy Has patient had a PCN reaction causing severe rash involving mucus membranes or skin necrosis: No Has patient had a PCN reaction that required hospitalization No Has patient had a PCN reaction occurring within the last 10 years: No If all of the above answers are "NO", then may proceed with Cephalosporin use.     Current Outpatient Medications  Medication Sig Dispense Refill  . alfuzosin (UROXATRAL) 10 MG 24 hr tablet Take 10 mg by mouth daily.    [name] amLODipine (NORVASC) 10 MG tablet Take 10 mg by mouth daily.    . Ascorbic Acid (VITAMIN C) 100 MG tablet Take 1,000 mg by mouth daily.      . Cyanocobalamin (VITAMIN B-12) 5000 MCG SUBL Place 1 tablet under the tongue daily.    . finasteride (PROSCAR) 5 MG tablet Take 5 mg by mouth daily.    [name] lisinopril-hydrochlorothiazide (PRINZIDE,ZESTORETIC) 20-25 MG per tablet Take 1 tablet by mouth daily.  1  . Omega-3 Fatty Acids (FISH OIL) 1000 MG CAPS Take 1,000 mg by mouth daily.    . rivaroxaban (XARELTO) 20 MG TABS tablet Take 1 tablet (20 mg total) by mouth daily with supper. 30 tablet 30  . simvastatin (ZOCOR) 20 MG tablet Take 20 mg by mouth every evening.    . traMADol (ULTRAM) 50 MG tablet Take 1 tablet (50 mg total) by mouth every 6 (six) hours as needed. (Patient taking differently: Take 50 mg by mouth every 6 (six) hours as needed for moderate pain. ) 30 tablet 0  . vitamin E 400 UNIT capsule Take 400 Units by mouth daily.     No current facility-administered medications for this visit.       Review of Systems:     Cardiac Review of Systems: Y or N  Chest Pain [   n ]  Resting SOB [n   ] Exertional SOB  [ y ]  Orthopnea [ n ]   Pedal Edema [ n  ]    Palpitations [ n ] Syncope  [n]   Presyncope [n   ]  General Review of Systems: [Y] = yes [  ]=no Constitional: recent weight change [  ];  Wt loss over the last 3 months [   ]  anorexia [  ]; fatigue [  ]; nausea [  ]; night sweats [  ]; fever [  ]; or chills [  ];          Dental: poor dentition[  ]; Last Dentist visit:   Eye : blurred vision [  ]; diplopia [   ]; vision changes [  ];  Amaurosis fugax[  ]; Resp: cough [  ];  wheezing[ n ];  hemoptysis[n  ]; shortness of breath[  ]; paroxysmal nocturnal dyspnea[  ]; dyspnea on exertion[ y ]; or orthopnea[  ];  GI:  gallstones[  ], vomiting[  ];  dysphagia[  ]; melena[  ];  hematochezia [  ]; heartburn[  ];   Hx of  Colonoscopy[  ]; GU: kidney stones [  ]; hematuria[  ];   dysuria [  ];  nocturia[  ];  history of     obstruction [  ]; urinary frequency [  ]             Skin: rash, swelling[  ];, hair loss[  ];  peripheral  edema[  ];  or itching[  ]; Musculosketetal: myalgias[  ];  joint swelling[  ];  joint erythema[  ];  joint pain[  ];  back pain[  ];  Heme/Lymph: bruising[  ];  bleeding[  ];  anemia[  ];  Neuro: TIA[ n ];  headaches[  ];  stroke[  ];  vertigo[  ];  seizures[  ];   paresthesias[  ];  difficulty walking[  ];  Psych:depression[  ]; anxiety[  ];  Endocrine: diabetes[  ];  thyroid dysfunction[  ];  Immunizations: Flu up to date [  ]; Pneumococcal up to date [  ];  Other:  Physical Exam: BP (!) 121/59 (BP Location: Right Arm, Patient Position: Sitting, Cuff Size: Normal)   Pulse 80   Resp 18   Ht 5\' 10"  (1.778 m)   Wt 190 lb (86.2 kg)   SpO2 98% Comment: RA  BMI 27.26 kg/m   PHYSICAL EXAMINATION: General appearance: alert, cooperative and appears older than stated age Head: Normocephalic, without obvious abnormality, atraumatic Neck: no adenopathy, no carotid bruit, no JVD, supple, symmetrical, trachea midline and thyroid not enlarged, symmetric, no tenderness/mass/nodules Lymph nodes: Cervical, supraclavicular, and axillary nodes normal. Resp: clear to auscultation bilaterally Back: symmetric, no curvature. ROM normal. No CVA tenderness. Cardio: regular rate and rhythm, S1, S2 normal, no murmur, click, rub or gallop GI: soft, non-tender; bowel sounds normal; no masses,  no organomegaly Extremities: extremities normal, a irregular rate consistent with atrial fibrillation traumatic, no cyanosis or edema and Homans sign is negative, no sign of DVT Neurologic: Grossly normal  Diagnostic Studies & Laboratory data:     Recent Radiology Findings: Ct Chest Wo Contrast  Result Date: 02/19/2018 CLINICAL DATA:  77 year old male with history of pulmonary nodule. Followup study. EXAM: CT CHEST WITHOUT CONTRAST TECHNIQUE: Multidetector CT imaging of the chest was performed following the standard protocol without IV contrast. COMPARISON:  Chest CT 09/18/2016.  PET-CT 12/30/2016. FINDINGS:  Cardiovascular: Heart size is normal. There is no significant pericardial fluid, thickening or pericardial calcification. There is aortic atherosclerosis, as well as atherosclerosis of the great vessels of the mediastinum and the coronary arteries, including calcified atherosclerotic plaque in the left main, left anterior descending, left circumflex and right coronary arteries. Mediastinum/Nodes: Multiple prominent borderline enlarged mediastinal and bilateral hilar lymph nodes are again noted, many of which contain internal coarse calcifications, similar to  the prior study. Esophagus is unremarkable in appearance. No axillary lymphadenopathy. Lungs/Pleura: Compared to prior examinations there is continued interval enlargement of what is now a 2.7 x 2.3 cm nodule in the right middle lobe (axial image 107 of series 8), which extends posteriorly to make contact with the major fissure which appears retracted slightly anteriorly by the lesion. There is some thickening along the major fissure adjacent to the lesion, concerning for early malignant spread of disease. In the anterior aspect of the right upper lobe just deep to the chest wall there is some new ground-glass attenuation and septal thickening, best appreciated on axial image 66 of series 8, which is of uncertain etiology and significance. New 7 mm nodule in the medial aspect of the right upper lobe (axial image 42 of series 8). 10 x 7 mm ground-glass attenuation nodule in the superior segment of the left lower lobe (axial image 49 of series 8). A few other patchy ill-defined areas of ground-glass attenuation are also noted in the medial aspect of the left upper lobe. Small calcified granuloma in the posterior aspect of the right upper lobe. No pleural effusions. Upper Abdomen: Aortic atherosclerosis. Low-attenuation lesions in segments 1, 3, 4A and 4B of the liver, incompletely characterized on today's noncontrast CT examination, but similar to prior studies,  likely to represent cysts. 2.4 x 2.0 cm intermediate attenuation (39 HU) right adrenal nodule, slightly smaller than prior examinations, incompletely characterized but presumably a benign lesions such as a lipid poor adenoma. Musculoskeletal: Several sclerotic lesions in the lateral aspect of the right fourth rib, similar to prior examinations, most compatible with small bone islands. There are no aggressive appearing lytic or blastic lesions noted in the visualized portions of the skeleton. IMPRESSION: 1. Interval growth of large nodule in the right middle lobe which has progressively demonstrated a more aggressive appearance, now intimately associated with the inferior aspect of the right major fissure, currently measuring 2.3 x 2.7 cm, highly concerning for neoplasm. 2. Multiple other smaller pulmonary nodules in the lungs bilaterally, as above. Close attention on followup studies is recommended. 3. Interval development of peripheral gland glass attenuation and subpleural reticulation in the right upper lobe anteriorly. This is of uncertain etiology and significance, but is favored to be of infectious or inflammatory origin. Attention on followup studies is recommended. If there is any clinical concern for hemorrhage in the setting of pulmonary embolism, further evaluation with PE protocol CT scan could also be considered at this time. 4. Aortic atherosclerosis, in addition to left main and 3 vessel coronary artery disease. Assessment for potential risk factor modification, dietary therapy or pharmacologic therapy may be warranted, if clinically indicated. 5. Additional incidental findings, as above. These results will be called to the ordering clinician or representative by the Radiologist Assistant, and communication documented in the PACS or zVision Dashboard. Aortic Atherosclerosis (ICD10-I70.0). Electronically Signed   By: Vinnie Langton M.D.   On: 02/19/2018 14:47    Nm Pet Image Restag (ps) Skull Base  To Thigh  Result Date: 12/30/2016 CLINICAL DATA:  Initial treatment strategy for lung nodule and adrenal mass. EXAM: NUCLEAR MEDICINE PET SKULL BASE TO THIGH TECHNIQUE: 13.0 mCi F-18 FDG was injected intravenously. Full-ring PET imaging was performed from the skull base to thigh after the radiotracer. CT data was obtained and used for attenuation correction and anatomic localization. FASTING BLOOD GLUCOSE:  Value: 105 mg/dl COMPARISON:  Multiple exams, including 09/18/2016 CT scan FINDINGS: NECK No hypermetabolic lymph nodes in the neck.  CHEST 1.8 cm right middle lobe pulmonary nodule has maximum standard uptake value 3.4, this has increased in size from 1.2 cm on 01/01/2016 and increased in maximum SUV from prior measurement of 2.7. Faintly accentuated activity in the right hilum is likely from a small hilar node, maximum SUV 5.6. A faintly hypermetabolic subcarinal node 1.4 cm in short axis on image 97/3, maximum SUV 4.7. Su faintly hypermetabolic left infrahilar region, but with maximum SUV of only 4.2. There some calcified mediastinal lymph nodes compatible with old granulomatous disease. Partially calcified 5 mm right upper lobe subpleural nodule on image 78/3. Ill-defined anterior right upper lobe subpleural density on image 93/3, possibly postinflammatory, with only vague metabolic activity. Small lipoma deep to the left serratus anterior muscle without hypermetabolic activity. Coronary, aortic arch, and branch vessel atherosclerotic vascular disease. ABDOMEN/PELVIS The right adrenal nodule measures 3.0 by 2.0 cm on image 154/3 (previously 2.1 by 2.4 cm on 01/01/2016) and has an internal density of 30 Hounsfield units with maximum SUV of 5.2 (Previously 5.9 on prior PET-CT). The contralateral adrenal gland has a maximum SUV of 4.4, for comparison purposes. Enlarged prostate gland without hypermetabolic activity. Hypodense but not hypermetabolic lesions in segment 4 and segment 3 of the liver, likely cysts.  Fullness of the left collecting system without hydroureter. Aortoiliac atherosclerotic vascular disease. There is a lipoma deep to the left gluteus maximus muscle which is probably exerting some mass effect on the left sciatic nerve. SKELETON No focal hypermetabolic activity to suggest skeletal metastasis. IMPRESSION: 1. Slow enlargement of the mildly hypermetabolic right middle lobe nodule, suspicious for low-grade malignancy such as bronchial carcinoid tumor. There are also several small faintly hypermetabolic lymph nodes in the chest as noted above. Another possibility in this case is low grade smoldering granulomatous disease. Tissue diagnosis of the right middle lobe lesion should be considered. 2. Enlarging right adrenal nodule is minimally asymmetrically hypermetabolic, but was previously more hypermetabolic on the prior PET-CT of 2017. This has enlarged mildly since the prior PET-CT, and probably merits surveillance. 3. Other imaging findings of potential clinical significance: Enlarged prostate gland. Coronary, aortic arch, and branch vessel atherosclerotic vascular disease. Lipoma posterior to the left gluteus maximus muscle, displacing the left sciatic nerve anteriorly; there is also a lipoma deep to the left serratus anterior muscle and along the left external oblique muscle. Electronically Signed   By: Van Clines M.D.   On: 12/30/2016 13:52        CLINICAL DATA:  Status post fall 12 feet from deer stand onto back. Patient on blood thinners. Lower back pain, acute onset. Initial encounter.  EXAM: CT CHEST, ABDOMEN, AND PELVIS WITH CONTRAST  TECHNIQUE: Multidetector CT imaging of the chest, abdomen and pelvis was performed following the standard protocol during bolus administration of intravenous contrast.  CONTRAST:  193mL ISOVUE-300 IOPAMIDOL (ISOVUE-300) INJECTION 61%  COMPARISON:  CT of the abdomen and pelvis from 02/29/2016, and CT of the chest performed  04/17/2016  FINDINGS: CT CHEST FINDINGS  Cardiovascular: Diffuse coronary artery calcifications are seen. The heart is borderline normal in size. Scattered calcification is noted along the thoracic aorta. The great vessels are grossly unremarkable in appearance. There is no evidence of aortic injury. No venous hemorrhage is seen.  Mediastinum/Nodes: A 1.2 cm node is noted at the subcarinal region. A mildly calcified 8 mm node is noted in the periaortic region. No pericardial effusion is identified. The visualized portions of the thyroid gland are unremarkable. No axillary lymphadenopathy is seen.  Lungs/Pleura: Mild bibasilar atelectasis is noted. New peripheral opacity at the right upper lobe could reflect a mild infectious or inflammatory process. A 1.7 x 1.4 cm nodule is noted at the right middle lobe, mildly increased in size from the prior study and suspicious for primary bronchogenic malignancy. No pleural effusion or pneumothorax is seen. There is no evidence of pulmonary parenchymal contusion.  Musculoskeletal: No acute osseous abnormalities are identified. Minimal nonspecific foci of high density are noted along the right fourth anterolateral rib, likely benign in nature. The visualized musculature is unremarkable in appearance.  CT ABDOMEN PELVIS FINDINGS  Hepatobiliary: Scattered nonspecific hypodensities are noted within the liver, measuring up to 1.6 cm in size. The gallbladder is unremarkable in appearance. The common bile duct remains normal in caliber.  Pancreas: The pancreas is unremarkable in appearance.  Spleen: The spleen is within normal limits.  Adrenals/Urinary Tract: A 3.5 x 3.0 cm right adrenal lesion is noted, with centrally decreased attenuation. The left adrenal gland is unremarkable.  Prominent bilateral renal parapelvic cysts are noted. Nonspecific perinephric stranding is noted bilaterally. There is no evidence of hydronephrosis.  No renal or ureteral stones are identified.  Stomach/Bowel: The stomach is unremarkable in appearance. The small bowel is within normal limits. The appendix is normal in caliber, without evidence of appendicitis. The colon is unremarkable in appearance.  Vascular/Lymphatic: Diffuse calcification is seen along the abdominal aorta and its branches. The abdominal aorta is otherwise grossly unremarkable. The inferior vena cava is grossly unremarkable. No retroperitoneal lymphadenopathy is seen. No pelvic sidewall lymphadenopathy is identified.  Reproductive: The bladder is mildly distended and grossly unremarkable. The prostate is enlarged, measuring 5.5 cm in transverse dimension, with impression on the base of the bladder.  Other: No free air or free fluid is seen within the abdomen or pelvis. There is no evidence of solid or hollow organ injury.  Musculoskeletal: There is a mild compression fracture involving the anterior superior endplate of L1, with only minimal loss of height. There is no evidence of retropulsion or extension to the posterior elements. The visualized musculature is unremarkable in appearance.  IMPRESSION: 1. Mild compression fracture involving the anterior superior endplate of L1, with only minimal loss of height. No evidence of retropulsion or extension to the posterior elements. 2. No additional evidence for traumatic injury to the chest, abdomen or pelvis. 3. 1.7 x 1.4 cm nodule at the right middle lung lobe has mildly increased in size from the prior study and is suspicious for enlarging primary bronchogenic carcinoma. Tissue diagnosis is recommended. 4. New peripheral opacity in the right upper lung lobe could reflect a mild infectious or inflammatory process. 5. 1.2 cm subcarinal node noted, of uncertain significance. 6. Diffuse coronary artery calcifications seen. 7. Small bibasilar atelectasis noted. 8. 3.5 x 3.0 cm right adrenal lesion noted,  with centrally decreased attenuation. This has increased in size from prior studies, and malignancy cannot be excluded. Would correlate with adrenal lab values. Adrenal protocol MRI or CT is recommended for further evaluation. 9. Scattered nonspecific hypodensities within the liver, measuring up to 1.6 cm in size. 10. Diffuse aortic atherosclerosis. 11. Enlarged prostate noted, with impression on the base of the bladder. Would correlate with PSA. 12. Prominent bilateral renal parapelvic cysts noted. These results were called by telephone at the time of interpretation on 09/18/2016 at 9:34 pm to the ER clinical team, who verbally acknowledged these results.   Electronically Signed   By: Garald Balding M.D.   On: 09/18/2016  21:42  Ct Chest Wo Contrast  04/17/2016  CLINICAL DATA:  Followup lung nodule EXAM: CT CHEST WITHOUT CONTRAST TECHNIQUE: Multidetector CT imaging of the chest was performed following the standard protocol without IV contrast. COMPARISON:  12/12/2015 FINDINGS: Mediastinum/Lymph Nodes: Calcified mediastinal lymph nodes are again noted and appears similar to previous exam. Aortic atherosclerosis. Calcification within the RCA, LAD coronary artery. The trachea appears patent and is midline. Normal appearance of the esophagus. Lungs/Pleura: No pulmonary mass, infiltrate, or effusion. Pulmonary nodule in the right middle lobe measures 1.6 x 1.1 cm, image 84 of series 4. Previously this measured 1.2 x 1.0 cm peer on PET-CT from 01/01/2016 this exhibited malignant range FDG uptake. Calcified granuloma noted within the posterior right upper lobe. Upper abdomen: No acute findings. Stable appearance of right adrenal nodule measuring 2.5 cm, image 131 of series 3. Similar appearance of liver cysts. No acute findings identified within the upper abdomen. Musculoskeletal: Spondylosis noted within the thoracic spine. No aggressive lytic or sclerotic bone lesions identified. IMPRESSION: 1.  Increase in size of right middle lobe lung nodule. This remains suspicious for primary bronchogenic carcinoma. 2. Similar appearance of calcified mediastinal nodes. 3. No change in 2.4 cm right adrenal nodule. 4. Aortic atherosclerosis and multi vessel coronary artery calcification. Electronically Signed   By: Kerby Moors M.D.   On: 04/17/2016 10:12    Nm Pet Image Initial (pi) Skull Base To Thigh  01/01/2016  CLINICAL DATA:  Initial treatment strategy for pulmonary nodule. EXAM: NUCLEAR MEDICINE PET SKULL BASE TO THIGH TECHNIQUE: 9.83 mCi F-18 FDG was injected intravenously. Full-ring PET imaging was performed from the skull base to thigh after the radiotracer. CT data was obtained and used for attenuation correction and anatomic localization. FASTING BLOOD GLUCOSE:  Value: 118 mg/dl COMPARISON:  CT chest dated 12/12/2015. CT abdomen pelvis dated 04/16/2015. FINDINGS: NECK No hypermetabolic lymph nodes in the neck. CHEST 12 x 10 mm right middle lobe nodule (series 6/ image 49), max SUV 2.7, suspicious for primary bronchogenic neoplasm. Mild subpleural nodularity in the posterior right upper lobe (series 6/image 19). No focal consolidation. No pleural effusion or pneumothorax. The heart is normal in size. Coronary atherosclerosis in the LAD and right coronary artery. Atherosclerotic calcifications aortic arch. Thoracic lymphadenopathy, including: --10 mm short axis AP window node (series 4/image 66), max SUV 5.7 --11 mm short axis partially calcified prevascular node (series 4/image 68) --12 mm short axis right hilar node (series 4/image 70), max SUV 6.5 --13 mm short axis partially calcified subcarinal node (series 4/image 32), max SUV 6.9 --11 mm short axis left hilar node (series 4/image 32), max SUV 6.6 Visualized right thyroid is notable for an 18 x 10 mm right thyroid nodule (series 4/image 45). ABDOMEN/PELVIS No abnormal hypermetabolic activity within the liver, pancreas, or spleen. 2.1 x 2.4 cm right  adrenal nodule, unchanged from prior 2016 CT abdomen pelvis, max SUV 5.9. Left renal sinus cysts. Atherosclerotic calcification of the abdominal aorta and branch vessels. Prostatomegaly, with enlargement of the central gland which indents the base the bladder. Bladder is mildly thick-walled although underdistended. Tiny fat containing bilateral inguinal hernias. No hypermetabolic lymph nodes in the abdomen or pelvis. SKELETON No focal hypermetabolic activity to suggest skeletal metastasis. IMPRESSION: 12 x 10 mm hypermetabolic right middle lobe nodule, suspicious for primary bronchogenic neoplasm. Hypermetabolic thoracic lymphadenopathy, most of which is partially calcified, favored to reflect granulomatous disease such as sarcoidosis. Nodal metastases is considered less likely. 2.1 x 2.4 cm hypermetabolic right adrenal nodule,  unchanged from June 2016. While technically indeterminate, metastasis not excluded, stability favors a benign etiology such as an adrenal adenoma. Electronically Signed   By: Julian Hy M.D.   On: 01/01/2016 10:47  I have independently reviewed the above radiology studies  and reviewed the findings with the patient.  Recent Lab Findings: Lab Results  Component Value Date   WBC 5.1 10/25/2017   HGB 12.4 (L) 10/25/2017   HCT 36.1 (L) 10/25/2017   PLT 127 (L) 10/25/2017   GLUCOSE 140 (H) 10/25/2017   ALT 23 01/25/2016   AST 18 01/25/2016   NA 137 10/25/2017   K 3.6 10/25/2017   CL 105 10/25/2017   CREATININE 1.20 10/25/2017   BUN 15 10/25/2017   CO2 25 10/25/2017   INR 1.07 01/25/2016   Chronic Kidney Disease   Stage I     GFR >90  Stage II    GFR 60-89  Stage IIIA GFR 45-59  Stage IIIB GFR 30-44  Stage IV   GFR 15-29  Stage V    GFR  <15  Lab Results  Component Value Date   CREATININE 1.20 10/25/2017   CrCl cannot be calculated (Patient's most recent lab result is older than the maximum 21 days allowed.).  PFT's: FEV1 3.07 100% Dlco 25   79%  Conclusions: The diffusion defect is consistent with a pulmonary vascular process. Anemia cannot be excluded as a potential cause of the diffusion defect without correcting the observed diffusing capacity for hemoglobin. Pulmonary Function Diagnosis: Minimal Diffusion Defect -Pulmonary Vascular  ECHO: LV EF: 65% - 70%  ------------------------------------------------------------------- History: PMH: Dyspnea. Risk factors: Smokless tabacco user. Former tobacco use. Hypertension. Diabetes mellitus. Dyslipidemia.  ------------------------------------------------------------------- Study Conclusions  - Left ventricle: The cavity size was normal. There was mild  concentric hypertrophy. Systolic function was vigorous. The  estimated ejection fraction was in the range of 65% to 70%. Wall  motion was normal; there were no regional wall motion  abnormalities. Doppler parameters are consistent with abnormal  left ventricular relaxation (grade 1 diastolic dysfunction).  There was no evidence of elevated ventricular filling pressure by  Doppler parameters. - Aortic valve: Trileaflet; normal thickness leaflets. Valve area  (Vmax): 1.58 cm^2. - Aortic root: The aortic root was normal in size. - Left atrium: The atrium was mildly dilated. - Right ventricle: Systolic function was normal. - Right atrium: The atrium was normal in size. - Tricuspid valve: There was trivial regurgitation. - Pulmonary arteries: Systolic pressure was within the normal  range. - Inferior vena cava: The vessel was normal in size. - Pericardium, extracardiac: There was no pericardial effusion.  Transthoracic echocardiography. M-mode, complete 2D, spectral Doppler, and color Doppler. Birthdate: Patient birthdate: [birth year]. Age: Patient is 77 yr old. Sex: Gender: male. BMI: 27.8 kg/m^2. Blood pressure: 126/50 Patient status: Outpatient. Study date: Study date: 01/23/2016.  Study time: 10:07 AM.  -------------------------------------------------------------------  ------------------------------------------------------------------- Left ventricle: The cavity size was normal. There was mild concentric hypertrophy. Systolic function was vigorous. The estimated ejection fraction was in the range of 65% to 70%. Wall motion was normal; there were no regional wall motion abnormalities. The transmitral flow pattern was normal. The deceleration time of the early transmitral flow velocity was normal. The pulmonary vein flow pattern was normal. The tissue Doppler parameters were normal. Doppler parameters are consistent with abnormal left ventricular relaxation (grade 1 diastolic dysfunction). There was no evidence of elevated ventricular filling pressure by Doppler parameters.  ------------------------------------------------------------------- Aortic valve: Trileaflet; normal thickness leaflets. Mobility was  not restricted. Sclerosis without stenosis. Doppler: Transvalvular velocity was within the normal range. There was no stenosis. There was no regurgitation. Peak velocity ratio of LVOT to aortic valve: 0.62. Valve area (Vmax): 1.58 cm^2. Indexed valve area (Vmax): 0.75 cm^2/m^2. Peak gradient (S): 9 mm Hg.  ------------------------------------------------------------------- Aorta: Aortic root: The aortic root was normal in size.  ------------------------------------------------------------------- Mitral valve: Structurally normal valve. Mobility was not restricted. Doppler: Transvalvular velocity was within the normal range. There was no evidence for stenosis. There was no regurgitation. Peak gradient (D): 2 mm Hg.  ------------------------------------------------------------------- Left atrium: The atrium was mildly dilated.  ------------------------------------------------------------------- Right ventricle: The cavity  size was normal. Wall thickness was normal. Systolic function was normal.  ------------------------------------------------------------------- Pulmonic valve: Doppler: Transvalvular velocity was within the normal range. There was no evidence for stenosis.  ------------------------------------------------------------------- Tricuspid valve: Structurally normal valve. Doppler: Transvalvular velocity was within the normal range. There was trivial regurgitation.  ------------------------------------------------------------------- Pulmonary artery: The main pulmonary artery was normal-sized. Systolic pressure was within the normal range.  ------------------------------------------------------------------- Right atrium: The atrium was normal in size.  ------------------------------------------------------------------- Pericardium: There was no pericardial effusion.  ------------------------------------------------------------------- Systemic veins: Inferior vena cava: The vessel was normal in size.  ------------------------------------------------------------------- Measurements  Left ventricle Value Reference LV ID, ED, PLAX chordal 44.7 mm 43 - 52 LV ID, ES, PLAX chordal 28.9 mm 23 - 38 LV fx shortening, PLAX chordal 35 % >=29 LV PW thickness, ED 12.9 mm --------- IVS/LV PW ratio, ED 1.02 <=1.3 Stroke volume, 2D 64 ml --------- Stroke volume/bsa, 2D 30 ml/m^2 --------- LV ejection fraction, 1-p A4C 57 % --------- LV end-diastolic volume, 2-p 097 ml --------- LV end-systolic volume, 2-p 52 ml --------- LV ejection fraction, 2-p 60 %  --------- Stroke volume, 2-p 78 ml --------- LV end-diastolic volume/bsa, 2-p 62 ml/m^2 --------- LV end-systolic volume/bsa, 2-p 25 ml/m^2 --------- Stroke volume/bsa, 2-p 37.1 ml/m^2 --------- LV e&', lateral 8.09 cm/s --------- LV E/e&', lateral 9.38 --------- LV s&', lateral 16 cm/s --------- LV e&', medial 8.29 cm/s --------- LV E/e&', medial 9.16 --------- LV e&', average 8.19 cm/s --------- LV E/e&', average 9.27 ---------  Ventricular septum Value Reference IVS thickness, ED 13.1 mm ---------  LVOT Value Reference LVOT ID, S 18 mm --------- LVOT area 2.54 cm^2 --------- LVOT peak velocity, S 93.1 cm/s --------- LVOT mean velocity, S 67 cm/s --------- LVOT VTI, S 25.2 cm ---------  Aortic valve Value Reference Aortic valve peak velocity, S 150 cm/s --------- Aortic peak gradient, S 9 mm Hg --------- Velocity ratio, peak, LVOT/AV 0.62 --------- Aortic valve area, peak velocity 1.58 cm^2 --------- Aortic valve area/bsa, peak 0.75 cm^2/m^2 --------- velocity  Aorta Value Reference Aortic root ID, ED 31 mm ---------  Left  atrium Value Reference LA ID, A-P, ES 42 mm --------- LA ID/bsa, A-P 2 cm/m^2 <=2.2 LA volume, S 44 ml --------- LA volume/bsa, S 20.9 ml/m^2 --------- LA volume, ES, 1-p A4C 44 ml --------- LA volume/bsa, ES, 1-p A4C 20.9 ml/m^2 --------- LA volume, ES, 1-p A2C 42 ml --------- LA volume/bsa, ES, 1-p A2C 20 ml/m^2 ---------  Mitral valve Value Reference Mitral E-wave peak velocity 75.9 cm/s --------- Mitral A-wave peak velocity 103 cm/s --------- Mitral deceleration time (H) 306 ms 150 - 230 Mitral peak gradient, D 2 mm Hg --------- Mitral E/A ratio, peak 0.8 --------- Mitral maximal regurg velocity, 341 cm/s --------- PISA  Pulmonary arteries Value Reference PA pressure, S, DP 20 mm Hg <=30  Tricuspid valve Value Reference Tricuspid regurg peak velocity 204 cm/s ---------  Tricuspid peak RV-RA gradient 17 mm Hg ---------  Right ventricle Value Reference RV ID, ED, PLAX 30.7 mm 19 - 38  Pulmonic valve Value Reference Pulmonic valve peak velocity, S 80.2 cm/s ---------  Legend: (L) and (H) mark values outside specified reference range.  ------------------------------------------------------------------- Prepared and Electronically Authenticated by  Ena Dawley,  M.D. 2017-03-22T12:44:23  Notes Recorded by James Klein, MD on 01/23/2016 at 2:20 PM Both the stress test and the cho are low risk. The aortic valve is sclerotic, but not stenotic. The LVEF is normal. OK to proceed with surgical procedure with Dr. Servando Snare.       Assessment / Plan:   Right middle lobelung nodule enlargingI discussed with the patient and his wife proceeding with surgical resection of the lesion in the right middle lobe.  Initially the patient t was agreeable with proceeding with surgery.  He then canceled surgery.  After reviewing the CT scan today noting the lesion was continued  to enlarge though slowly, the patient is agreeable with proceeding with surgical resection without further biopsy attempts.  We will plan bronchoscopy right video-assisted thoracoscopy and resection of the right middle lobe mass.  Again the risks and options of surgery have been discussed with the patient and his wife in detail.  Patient will check the [place] calendar and call back with dates that are agreeable to proceed with surgery.  Since he is now on Xarelto this will need to be held before any surgical intervention   [name] MD      [address] [place] [address] Office [phone]   Beeper [phone]  02/19/2018 3:22 PM

## 2018-04-07 NOTE — Pre-Procedure Instructions (Signed)
GERHARDT GLEED  04/07/2018      Your procedure is scheduled on April 12, 2018.  Report to [facility] Admitting at 05:30 A.M.  Call this number if you have problems the morning of surgery:  [phone]   Remember:  No food or liquids after midnight.  Continue all other medications as directed by your physician except for following these medication instructions before surgery.     Take these medicines the morning of surgery with A SIP OF WATER : Amlodipine (Norvasc)  7 days prior to surgery STOP taking any Aspirin (unless otherwise instructed by your surgeon), Aleve, Naproxen, Ibuprofen, Motrin, Advil, Goody's, BC's, all herbal medications, fish oil, and all vitamins.  STOP your Xarelto as instructed by your doctor.     Do not wear jewelry.  Do not wear lotions, powders, or colognes, or deodorant.  Do not shave 48 hours prior to surgery.  Men may shave face and neck.  Do not bring valuables to the hospital.   [facility] is not responsible for any belongings or valuables.  Contacts, dentures or bridgework may not be worn into surgery.  Leave your suitcase in the car.  After surgery it may be brought to your room.  For patients admitted to the hospital, discharge time will be determined by your treatment team.  Patients discharged the day of surgery will not be allowed to drive home.   Special instructions:   [place]- Preparing For Surgery  Before surgery, you can play an important role. Because skin is not sterile, your skin needs to be as free of germs as possible. You can reduce the number of germs on your skin by washing with CHG (chlorahexidine gluconate) Soap before surgery.  CHG is an antiseptic cleaner which kills germs and bonds with the skin to continue killing germs even after washing.    Oral Hygiene is also important to reduce your risk of infection.  Remember - BRUSH YOUR TEETH THE MORNING OF SURGERY WITH YOUR REGULAR TOOTHPASTE  Please do not  use if you have an allergy to CHG or antibacterial soaps. If your skin becomes reddened/irritated stop using the CHG.  Do not shave (including legs and underarms) for at least 48 hours prior to first CHG shower. It is OK to shave your face.  Please follow these instructions carefully.   1. Shower the NIGHT BEFORE SURGERY and the MORNING OF SURGERY with CHG.   2. If you chose to wash your hair, wash your hair first as usual with your normal shampoo.  3. After you shampoo, rinse your hair and body thoroughly to remove the shampoo.  4. Use CHG as you would any other liquid soap. You can apply CHG directly to the skin and wash gently with a scrungie or a clean washcloth.   5. Apply the CHG Soap to your body ONLY FROM THE NECK DOWN.  Do not use on open wounds or open sores. Avoid contact with your eyes, ears, mouth and genitals (private parts). Wash Face and genitals (private parts)  with your normal soap.  6. Wash thoroughly, paying special attention to the area where your surgery will be performed.  7. Thoroughly rinse your body with warm water from the neck down.  8. DO NOT shower/wash with your normal soap after using and rinsing off the CHG Soap.  9. Pat yourself dry with a CLEAN TOWEL.  10. Wear CLEAN PAJAMAS to bed the night before surgery, wear comfortable clothes the  morning of surgery  11. Place CLEAN SHEETS on your bed the night of your first shower and DO NOT SLEEP WITH PETS.    Day of Surgery:  Do not apply any deodorants/lotions.  Please wear clean clothes to the hospital/surgery center.   Remember to brush your teeth WITH YOUR REGULAR TOOTHPASTE.    Please read over the following fact sheets that you were given.

## 2018-04-08 ENCOUNTER — Encounter (HOSPITAL_COMMUNITY): Payer: Self-pay

## 2018-04-08 ENCOUNTER — Encounter (HOSPITAL_COMMUNITY)
Admission: RE | Admit: 2018-04-08 | Discharge: 2018-04-08 | Disposition: A | Discharge status: Home / Self Care | Payer: Medicare Other | Source: Ambulatory Visit | Attending: Cardiothoracic Surgery | Admitting: Cardiothoracic Surgery

## 2018-04-08 DIAGNOSIS — R918 Other nonspecific abnormal finding of lung field: Secondary | ICD-10-CM

## 2018-04-08 DIAGNOSIS — R9431 Abnormal electrocardiogram [ECG] [EKG]: Secondary | ICD-10-CM | POA: Diagnosis not present

## 2018-04-08 DIAGNOSIS — Z01810 Encounter for preprocedural cardiovascular examination: Secondary | ICD-10-CM | POA: Insufficient documentation

## 2018-04-08 HISTORY — DX: Frequency of micturition: R35.0

## 2018-04-08 LAB — COMPREHENSIVE METABOLIC PANEL
ALT: 25 U/L (ref 17–63)
AST: 20 U/L (ref 15–41)
Albumin: 4.2 g/dL (ref 3.5–5.0)
Alkaline Phosphatase: 59 U/L (ref 38–126)
Anion gap: 9 (ref 5–15)
BUN: 23 mg/dL — ABNORMAL HIGH (ref 6–20)
CO2: 25 mmol/L (ref 22–32)
Calcium: 9.3 mg/dL (ref 8.9–10.3)
Chloride: 106 mmol/L (ref 101–111)
Creatinine, Ser: 1.31 mg/dL — ABNORMAL HIGH (ref 0.61–1.24)
GFR calc Af Amer: 59 mL/min — ABNORMAL LOW (ref >60)
GFR calc non Af Amer: 51 mL/min — ABNORMAL LOW (ref >60)
Glucose, Bld: 98 mg/dL (ref 65–99)
Potassium: 3.7 mmol/L (ref 3.5–5.1)
Sodium: 140 mmol/L (ref 135–145)
Total Bilirubin: 0.8 mg/dL (ref 0.3–1.2)
Total Protein: 6.7 g/dL (ref 6.5–8.1)

## 2018-04-08 LAB — TYPE AND SCREEN
ABO/RH(D): O POS
Antibody Screen: NEGATIVE

## 2018-04-08 LAB — ABO/RH: ABO/RH(D): O POS

## 2018-04-08 LAB — SURGICAL PCR SCREEN
MRSA, PCR: NEGATIVE
Staphylococcus aureus: NEGATIVE

## 2018-04-08 LAB — BLOOD GAS, ARTERIAL
Acid-Base Excess: 1.6 mmol/L (ref 0.0–2.0)
Bicarbonate: 25.9 mmol/L (ref 20.0–28.0)
Drawn by: 421801
FIO2: 21
O2 Saturation: 97.5 %
Patient temperature: 98.6
pCO2 arterial: 42.5 mmHg (ref 32.0–48.0)
pH, Arterial: 7.402 (ref 7.350–7.450)
pO2, Arterial: 104 mmHg (ref 83.0–108.0)

## 2018-04-08 LAB — HEMOGLOBIN A1C
Hgb A1c MFr Bld: 6.3 % — ABNORMAL HIGH (ref 4.8–5.6)
Mean Plasma Glucose: 134.11 mg/dL

## 2018-04-08 LAB — CBC
HCT: 37.4 % — ABNORMAL LOW (ref 39.0–52.0)
Hemoglobin: 12.6 g/dL — ABNORMAL LOW (ref 13.0–17.0)
MCH: 34.7 pg — ABNORMAL HIGH (ref 26.0–34.0)
MCHC: 33.7 g/dL (ref 30.0–36.0)
MCV: 103 fL — ABNORMAL HIGH (ref 78.0–100.0)
Platelets: 134 10*3/uL — ABNORMAL LOW (ref 150–400)
RBC: 3.63 MIL/uL — ABNORMAL LOW (ref 4.22–5.81)
RDW: 13.2 % (ref 11.5–15.5)
WBC: 3.7 10*3/uL — ABNORMAL LOW (ref 4.0–10.5)

## 2018-04-08 LAB — URINALYSIS, ROUTINE W REFLEX MICROSCOPIC
Bilirubin Urine: NEGATIVE
Glucose, UA: NEGATIVE mg/dL
Hgb urine dipstick: NEGATIVE
Ketones, ur: NEGATIVE mg/dL
Leukocytes, UA: NEGATIVE
Nitrite: NEGATIVE
Protein, ur: NEGATIVE mg/dL
Specific Gravity, Urine: 1.014 (ref 1.005–1.030)
pH: 5 (ref 5.0–8.0)

## 2018-04-08 LAB — PROTIME-INR
INR: 1.17
Prothrombin Time: 14.8 seconds (ref 11.4–15.2)

## 2018-04-08 LAB — APTT: aPTT: 30 seconds (ref 24–36)

## 2018-04-08 LAB — GLUCOSE, CAPILLARY: Glucose-Capillary: 117 mg/dL — ABNORMAL HIGH (ref 65–99)

## 2018-04-08 NOTE — Progress Notes (Signed)
Last office visit with Dr. Sallyanne Kuster 01-19-18  ECHO 01-22-18 Stress 01-22-18   CXR to be done day of surgery  Took last dose of xarelto on 04-07-18

## 2018-04-11 NOTE — Anesthesia Preprocedure Evaluation (Addendum)
Anesthesia Evaluation  Patient identified by MRN, date of birth, ID band Patient awake    Reviewed: Allergy & Precautions, NPO status , Patient's Chart, lab work & pertinent test results  Airway Mallampati: II  TM Distance: >3 FB Neck ROM: Full    Dental  (+) Edentulous Upper, Edentulous Lower   Pulmonary shortness of breath, COPD, former smoker,    breath sounds clear to auscultation       Cardiovascular hypertension, + CAD   Rhythm:Regular Rate:Normal     Neuro/Psych    GI/Hepatic   Endo/Other  diabetes  Renal/GU      Musculoskeletal  (+) Arthritis ,   Abdominal   Peds  Hematology negative hematology ROS (+)   Anesthesia Other Findings   Reproductive/Obstetrics                            Anesthesia Physical Anesthesia Plan  ASA: III  Anesthesia Plan: General   Post-op Pain Management:    Induction: Intravenous  PONV Risk Score and Plan: 3 and Treatment may vary due to age or medical condition  Airway Management Planned: Oral ETT  Additional Equipment: Arterial line and CVP  Intra-op Plan:   Post-operative Plan: Extubation in OR  Informed Consent: I have reviewed the patients History and Physical, chart, labs and discussed the procedure including the risks, benefits and alternatives for the proposed anesthesia with the patient or authorized representative who has indicated his/her understanding and acceptance.   Dental advisory given  Plan Discussed with: CRNA  Anesthesia Plan Comments:         Anesthesia Quick Evaluation

## 2018-04-12 ENCOUNTER — Other Ambulatory Visit: Payer: Self-pay

## 2018-04-12 ENCOUNTER — Encounter (HOSPITAL_COMMUNITY): Payer: Self-pay | Admitting: *Deleted

## 2018-04-12 ENCOUNTER — Inpatient Hospital Stay (HOSPITAL_COMMUNITY): Payer: Medicare Other

## 2018-04-12 ENCOUNTER — Inpatient Hospital Stay (HOSPITAL_COMMUNITY)
Admission: RE | Admit: 2018-04-12 | Discharge: 2018-04-16 | DRG: 821 | Disposition: A | Discharge status: Home / Self Care | Payer: Medicare Other | Attending: Cardiothoracic Surgery | Admitting: Cardiothoracic Surgery

## 2018-04-12 ENCOUNTER — Inpatient Hospital Stay (HOSPITAL_COMMUNITY): Payer: Medicare Other | Admitting: Emergency Medicine

## 2018-04-12 ENCOUNTER — Inpatient Hospital Stay (HOSPITAL_COMMUNITY): Payer: Medicare Other | Admitting: Anesthesiology

## 2018-04-12 ENCOUNTER — Encounter (HOSPITAL_COMMUNITY)
Admission: RE | Disposition: A | Discharge status: Home / Self Care | Payer: Self-pay | Source: Home / Self Care | Attending: Cardiothoracic Surgery

## 2018-04-12 DIAGNOSIS — N189 Chronic kidney disease, unspecified: Secondary | ICD-10-CM | POA: Diagnosis not present

## 2018-04-12 DIAGNOSIS — E1122 Type 2 diabetes mellitus with diabetic chronic kidney disease: Secondary | ICD-10-CM | POA: Diagnosis present

## 2018-04-12 DIAGNOSIS — D62 Acute posthemorrhagic anemia: Secondary | ICD-10-CM | POA: Diagnosis not present

## 2018-04-12 DIAGNOSIS — C884 Extranodal marginal zone B-cell lymphoma of mucosa-associated lymphoid tissue [MALT-lymphoma]: Secondary | ICD-10-CM | POA: Diagnosis not present

## 2018-04-12 DIAGNOSIS — Z7901 Long term (current) use of anticoagulants: Secondary | ICD-10-CM | POA: Diagnosis not present

## 2018-04-12 DIAGNOSIS — I4891 Unspecified atrial fibrillation: Secondary | ICD-10-CM | POA: Diagnosis not present

## 2018-04-12 DIAGNOSIS — Z880 Allergy status to penicillin: Secondary | ICD-10-CM

## 2018-04-12 DIAGNOSIS — E785 Hyperlipidemia, unspecified: Secondary | ICD-10-CM | POA: Diagnosis present

## 2018-04-12 DIAGNOSIS — Z8249 Family history of ischemic heart disease and other diseases of the circulatory system: Secondary | ICD-10-CM | POA: Diagnosis not present

## 2018-04-12 DIAGNOSIS — K5900 Constipation, unspecified: Secondary | ICD-10-CM | POA: Diagnosis present

## 2018-04-12 DIAGNOSIS — Z800 Family history of malignant neoplasm of digestive organs: Secondary | ICD-10-CM

## 2018-04-12 DIAGNOSIS — Z833 Family history of diabetes mellitus: Secondary | ICD-10-CM

## 2018-04-12 DIAGNOSIS — Z87891 Personal history of nicotine dependence: Secondary | ICD-10-CM | POA: Diagnosis not present

## 2018-04-12 DIAGNOSIS — R918 Other nonspecific abnormal finding of lung field: Secondary | ICD-10-CM

## 2018-04-12 DIAGNOSIS — I129 Hypertensive chronic kidney disease with stage 1 through stage 4 chronic kidney disease, or unspecified chronic kidney disease: Secondary | ICD-10-CM | POA: Diagnosis present

## 2018-04-12 DIAGNOSIS — R222 Localized swelling, mass and lump, trunk: Secondary | ICD-10-CM

## 2018-04-12 DIAGNOSIS — H9190 Unspecified hearing loss, unspecified ear: Secondary | ICD-10-CM | POA: Diagnosis present

## 2018-04-12 DIAGNOSIS — Z09 Encounter for follow-up examination after completed treatment for conditions other than malignant neoplasm: Secondary | ICD-10-CM

## 2018-04-12 DIAGNOSIS — Z9689 Presence of other specified functional implants: Secondary | ICD-10-CM

## 2018-04-12 HISTORY — PX: VIDEO ASSISTED THORACOSCOPY (VATS)/WEDGE RESECTION: SHX6174

## 2018-04-12 HISTORY — PX: VIDEO BRONCHOSCOPY: SHX5072

## 2018-04-12 LAB — GLUCOSE, CAPILLARY
Glucose-Capillary: 109 mg/dL — ABNORMAL HIGH (ref 65–99)
Glucose-Capillary: 203 mg/dL — ABNORMAL HIGH (ref 65–99)

## 2018-04-12 SURGERY — BRONCHOSCOPY, VIDEO-ASSISTED
Anesthesia: General | Site: Chest | Laterality: Right

## 2018-04-12 MED ORDER — LOSARTAN POTASSIUM-HCTZ 100-25 MG PO TABS: 1.0000 | ORAL_TABLET | Freq: Every day | ORAL | Status: DC

## 2018-04-12 MED ORDER — ALBUTEROL SULFATE (2.5 MG/3ML) 0.083% IN NEBU: 2.5000 mg | INHALATION_SOLUTION | RESPIRATORY_TRACT | Status: DC | PRN

## 2018-04-12 MED ORDER — ROCURONIUM BROMIDE 10 MG/ML (PF) SYRINGE
PREFILLED_SYRINGE | INTRAVENOUS | Status: AC
  Filled 2018-04-12: qty 5

## 2018-04-12 MED ORDER — POTASSIUM CHLORIDE 10 MEQ/50ML IV SOLN
10.0000 meq | Freq: Every day | INTRAVENOUS | Status: DC | PRN
  Filled 2018-04-12: qty 50

## 2018-04-12 MED ORDER — AMLODIPINE BESYLATE 10 MG PO TABS
10.0000 mg | ORAL_TABLET | Freq: Every day | ORAL | Status: DC
  Administered 2018-04-13 – 2018-04-16 (×4): 10 mg via ORAL
  Filled 2018-04-12 (×4): qty 1

## 2018-04-12 MED ORDER — FINASTERIDE 5 MG PO TABS
5.0000 mg | ORAL_TABLET | Freq: Every day | ORAL | Status: DC
  Administered 2018-04-12 – 2018-04-15 (×4): 5 mg via ORAL
  Filled 2018-04-12 (×4): qty 1

## 2018-04-12 MED ORDER — VANCOMYCIN HCL IN DEXTROSE 1-5 GM/200ML-% IV SOLN
1000.0000 mg | INTRAVENOUS | Status: AC
  Administered 2018-04-12: 1000 mg via INTRAVENOUS
  Filled 2018-04-12: qty 200

## 2018-04-12 MED ORDER — ALBUTEROL SULFATE (2.5 MG/3ML) 0.083% IN NEBU: 2.5000 mg | INHALATION_SOLUTION | RESPIRATORY_TRACT | Status: DC

## 2018-04-12 MED ORDER — OXYCODONE HCL 5 MG PO TABS
5.0000 mg | ORAL_TABLET | ORAL | Status: DC | PRN
  Administered 2018-04-13: 5 mg via ORAL
  Administered 2018-04-14 (×2): 10 mg via ORAL
  Filled 2018-04-12: qty 2
  Filled 2018-04-12: qty 1
  Filled 2018-04-12 (×2): qty 2

## 2018-04-12 MED ORDER — DEXTROSE-NACL 5-0.45 % IV SOLN
INTRAVENOUS | Status: DC
  Administered 2018-04-12 – 2018-04-13 (×3): via INTRAVENOUS

## 2018-04-12 MED ORDER — LOSARTAN POTASSIUM 50 MG PO TABS: 100.0000 mg | ORAL_TABLET | Freq: Every day | ORAL | Status: DC

## 2018-04-12 MED ORDER — ROCURONIUM BROMIDE 10 MG/ML (PF) SYRINGE
PREFILLED_SYRINGE | INTRAVENOUS | Status: DC | PRN
Start: 2018-04-12 — End: 2018-04-12
  Administered 2018-04-12: 20 mg via INTRAVENOUS
  Administered 2018-04-12: 50 mg via INTRAVENOUS
  Administered 2018-04-12: 20 mg via INTRAVENOUS

## 2018-04-12 MED ORDER — ONDANSETRON HCL 4 MG/2ML IJ SOLN: 4.0000 mg | Freq: Four times a day (QID) | INTRAMUSCULAR | Status: DC | PRN

## 2018-04-12 MED ORDER — ORAL CARE MOUTH RINSE
15.0000 mL | Freq: Two times a day (BID) | OROMUCOSAL | Status: DC
  Administered 2018-04-13 – 2018-04-15 (×4): 15 mL via OROMUCOSAL

## 2018-04-12 MED ORDER — LIDOCAINE HCL (CARDIAC) PF 100 MG/5ML IV SOSY
PREFILLED_SYRINGE | INTRAVENOUS | Status: DC | PRN
  Administered 2018-04-12: 80 mg via INTRAVENOUS

## 2018-04-12 MED ORDER — 0.9 % SODIUM CHLORIDE (POUR BTL) OPTIME
TOPICAL | Status: DC | PRN
  Administered 2018-04-12: 2000 mL

## 2018-04-12 MED ORDER — BUPIVACAINE HCL (PF) 0.5 % IJ SOLN
INTRAMUSCULAR | Status: AC
  Filled 2018-04-12: qty 10

## 2018-04-12 MED ORDER — OXYCODONE HCL 5 MG/5ML PO SOLN: 5.0000 mg | Freq: Once | ORAL | Status: DC | PRN

## 2018-04-12 MED ORDER — PROPRANOLOL HCL 1 MG/ML IV SOLN
1.0000 mg | INTRAVENOUS | Status: AC
  Administered 2018-04-12: .25 mg via INTRAVENOUS
  Filled 2018-04-12: qty 1

## 2018-04-12 MED ORDER — ONDANSETRON HCL 4 MG/2ML IJ SOLN
4.0000 mg | Freq: Four times a day (QID) | INTRAMUSCULAR | Status: DC | PRN
  Administered 2018-04-12 (×2): 4 mg via INTRAVENOUS
  Filled 2018-04-12 (×2): qty 2

## 2018-04-12 MED ORDER — SODIUM CHLORIDE 0.9% FLUSH
10.0000 mL | Freq: Two times a day (BID) | INTRAVENOUS | Status: DC
  Administered 2018-04-13: 10 mL
  Administered 2018-04-13: 20 mL
  Administered 2018-04-14 (×2): 10 mL

## 2018-04-12 MED ORDER — TRAMADOL HCL 50 MG PO TABS
50.0000 mg | ORAL_TABLET | Freq: Four times a day (QID) | ORAL | Status: DC | PRN
  Administered 2018-04-12: 50 mg via ORAL
  Administered 2018-04-13 – 2018-04-14 (×3): 100 mg via ORAL
  Filled 2018-04-12: qty 1
  Filled 2018-04-12 (×3): qty 2

## 2018-04-12 MED ORDER — ACETAMINOPHEN 500 MG PO TABS
1000.0000 mg | ORAL_TABLET | Freq: Four times a day (QID) | ORAL | Status: DC
  Filled 2018-04-12: qty 2

## 2018-04-12 MED ORDER — BUPIVACAINE HCL 0.5 % IJ SOLN
INTRAMUSCULAR | Status: DC | PRN
  Administered 2018-04-12: 5 mL

## 2018-04-12 MED ORDER — DEXAMETHASONE SODIUM PHOSPHATE 10 MG/ML IJ SOLN
INTRAMUSCULAR | Status: AC
  Filled 2018-04-12: qty 1

## 2018-04-12 MED ORDER — ONDANSETRON HCL 4 MG/2ML IJ SOLN
INTRAMUSCULAR | Status: DC | PRN
  Administered 2018-04-12 (×2): 4 mg via INTRAVENOUS

## 2018-04-12 MED ORDER — LIDOCAINE 2% (20 MG/ML) 5 ML SYRINGE
INTRAMUSCULAR | Status: AC
  Filled 2018-04-12: qty 5

## 2018-04-12 MED ORDER — DIPHENHYDRAMINE HCL 12.5 MG/5ML PO ELIX: 12.5000 mg | ORAL_SOLUTION | Freq: Four times a day (QID) | ORAL | Status: DC | PRN

## 2018-04-12 MED ORDER — HYDROMORPHONE HCL 2 MG/ML IJ SOLN
INTRAMUSCULAR | Status: AC
  Filled 2018-04-12: qty 1

## 2018-04-12 MED ORDER — ROCURONIUM BROMIDE 100 MG/10ML IV SOLN: INTRAVENOUS | Status: DC | PRN

## 2018-04-12 MED ORDER — NALOXONE HCL 0.4 MG/ML IJ SOLN: 0.4000 mg | INTRAMUSCULAR | Status: DC | PRN

## 2018-04-12 MED ORDER — POLYVINYL ALCOHOL 1.4 % OP SOLN
1.0000 [drp] | Freq: Two times a day (BID) | OPHTHALMIC | Status: DC | PRN
  Filled 2018-04-12: qty 15

## 2018-04-12 MED ORDER — DEXAMETHASONE SODIUM PHOSPHATE 10 MG/ML IJ SOLN
INTRAMUSCULAR | Status: DC | PRN
  Administered 2018-04-12: 5 mg via INTRAVENOUS

## 2018-04-12 MED ORDER — OXYCODONE HCL 5 MG PO TABS: 5.0000 mg | ORAL_TABLET | Freq: Once | ORAL | Status: DC | PRN

## 2018-04-12 MED ORDER — SUGAMMADEX SODIUM 200 MG/2ML IV SOLN
INTRAVENOUS | Status: DC | PRN
  Administered 2018-04-12: 200 mg via INTRAVENOUS

## 2018-04-12 MED ORDER — ALBUTEROL SULFATE (2.5 MG/3ML) 0.083% IN NEBU
2.5000 mg | INHALATION_SOLUTION | RESPIRATORY_TRACT | Status: DC
  Filled 2018-04-12 (×2): qty 3

## 2018-04-12 MED ORDER — SODIUM CHLORIDE 0.9% FLUSH: 9.0000 mL | INTRAVENOUS | Status: DC | PRN

## 2018-04-12 MED ORDER — ACETAMINOPHEN 160 MG/5ML PO SOLN: 1000.0000 mg | Freq: Four times a day (QID) | ORAL | Status: DC

## 2018-04-12 MED ORDER — FENTANYL 40 MCG/ML IV SOLN
INTRAVENOUS | Status: DC
  Administered 2018-04-12: 40 ug via INTRAVENOUS
  Administered 2018-04-12: 1000 ug via INTRAVENOUS
  Filled 2018-04-12: qty 25

## 2018-04-12 MED ORDER — PROPOFOL 10 MG/ML IV BOLUS
INTRAVENOUS | Status: DC | PRN
  Administered 2018-04-12: 120 mg via INTRAVENOUS
  Administered 2018-04-12 (×2): 30 mg via INTRAVENOUS

## 2018-04-12 MED ORDER — PROMETHAZINE HCL 6.25 MG/5ML PO SYRP
6.2500 mg | ORAL_SOLUTION | Freq: Once | ORAL | Status: DC
  Filled 2018-04-12: qty 5

## 2018-04-12 MED ORDER — ONDANSETRON HCL 4 MG/2ML IJ SOLN
INTRAMUSCULAR | Status: AC
  Filled 2018-04-12: qty 2

## 2018-04-12 MED ORDER — DIPHENHYDRAMINE HCL 50 MG/ML IJ SOLN
12.5000 mg | Freq: Four times a day (QID) | INTRAMUSCULAR | Status: DC | PRN
Start: 2018-04-12 — End: 2018-04-12

## 2018-04-12 MED ORDER — BUPIVACAINE 0.5 % ON-Q PUMP SINGLE CATH 400 ML
INJECTION | Status: AC | PRN
  Administered 2018-04-12: 400 mL

## 2018-04-12 MED ORDER — PROMETHAZINE HCL 25 MG/ML IJ SOLN
6.2500 mg | Freq: Once | INTRAMUSCULAR | Status: AC
  Administered 2018-04-12: 6.25 mg via INTRAVENOUS
  Filled 2018-04-12: qty 1

## 2018-04-12 MED ORDER — SIMVASTATIN 20 MG PO TABS
20.0000 mg | ORAL_TABLET | Freq: Every evening | ORAL | Status: DC
  Administered 2018-04-13 – 2018-04-15 (×3): 20 mg via ORAL
  Filled 2018-04-12 (×3): qty 1

## 2018-04-12 MED ORDER — SENNOSIDES-DOCUSATE SODIUM 8.6-50 MG PO TABS
1.0000 | ORAL_TABLET | Freq: Every day | ORAL | Status: DC
  Administered 2018-04-12 – 2018-04-15 (×4): 1 via ORAL
  Filled 2018-04-12 (×4): qty 1

## 2018-04-12 MED ORDER — HYDROMORPHONE HCL 2 MG/ML IJ SOLN
0.2500 mg | INTRAMUSCULAR | Status: DC | PRN
  Administered 2018-04-12 (×2): 0.3 mg via INTRAVENOUS

## 2018-04-12 MED ORDER — VANCOMYCIN HCL IN DEXTROSE 1-5 GM/200ML-% IV SOLN
1000.0000 mg | Freq: Two times a day (BID) | INTRAVENOUS | Status: AC
  Administered 2018-04-12: 1000 mg via INTRAVENOUS
  Filled 2018-04-12: qty 200

## 2018-04-12 MED ORDER — BISACODYL 5 MG PO TBEC
10.0000 mg | DELAYED_RELEASE_TABLET | Freq: Every day | ORAL | Status: DC
  Administered 2018-04-12 – 2018-04-14 (×3): 10 mg via ORAL
  Filled 2018-04-12 (×4): qty 2

## 2018-04-12 MED ORDER — CHLORHEXIDINE GLUCONATE CLOTH 2 % EX PADS
6.0000 | MEDICATED_PAD | Freq: Every day | CUTANEOUS | Status: DC
  Administered 2018-04-13: 6 via TOPICAL

## 2018-04-12 MED ORDER — SODIUM CHLORIDE 0.9% FLUSH: 10.0000 mL | INTRAVENOUS | Status: DC | PRN

## 2018-04-12 MED ORDER — FENTANYL CITRATE (PF) 250 MCG/5ML IJ SOLN
INTRAMUSCULAR | Status: AC
  Filled 2018-04-12: qty 5

## 2018-04-12 MED ORDER — HYDROMORPHONE HCL 2 MG/ML IJ SOLN: 0.2500 mg | INTRAMUSCULAR | Status: DC | PRN

## 2018-04-12 MED ORDER — ALFUZOSIN HCL ER 10 MG PO TB24
10.0000 mg | ORAL_TABLET | Freq: Every day | ORAL | Status: DC
  Administered 2018-04-13 – 2018-04-15 (×3): 10 mg via ORAL
  Filled 2018-04-12 (×4): qty 1

## 2018-04-12 MED ORDER — FENTANYL CITRATE (PF) 100 MCG/2ML IJ SOLN
INTRAMUSCULAR | Status: DC | PRN
  Administered 2018-04-12: 50 ug via INTRAVENOUS
  Administered 2018-04-12 (×2): 25 ug via INTRAVENOUS
  Administered 2018-04-12 (×3): 50 ug via INTRAVENOUS

## 2018-04-12 MED ORDER — BUPIVACAINE 0.5 % ON-Q PUMP SINGLE CATH 400 ML
400.0000 mL | INJECTION | Status: DC
  Filled 2018-04-12: qty 400

## 2018-04-12 MED ORDER — HYDROCHLOROTHIAZIDE 25 MG PO TABS
25.0000 mg | ORAL_TABLET | Freq: Every day | ORAL | Status: DC
  Administered 2018-04-13: 25 mg via ORAL
  Filled 2018-04-12: qty 1

## 2018-04-12 MED ORDER — LACTATED RINGERS IV SOLN
INTRAVENOUS | Status: DC | PRN
  Administered 2018-04-12: 06:00:00 via INTRAVENOUS

## 2018-04-12 SURGICAL SUPPLY — 89 items
ADH SKN CLS APL DERMABOND .7 (GAUZE/BANDAGES/DRESSINGS)
APL SRG 22X2 LUM MLBL SLNT (VASCULAR PRODUCTS)
APL SRG 7X2 LUM MLBL SLNT (VASCULAR PRODUCTS)
APL SWBSTK 6 STRL LF DISP (MISCELLANEOUS) ×2
APPLICATOR COTTON TIP 6 STRL (MISCELLANEOUS) IMPLANT
APPLICATOR COTTON TIP 6IN STRL (MISCELLANEOUS) ×3
APPLICATOR TIP COSEAL (VASCULAR PRODUCTS) IMPLANT
APPLICATOR TIP EXT COSEAL (VASCULAR PRODUCTS) IMPLANT
BAG SPEC RTRVL LRG 6X4 10 (ENDOMECHANICALS) ×2
BLADE SURG 11 STRL SS (BLADE) ×1 IMPLANT
BRUSH CYTOL CELLEBRITY 1.5X140 (MISCELLANEOUS) IMPLANT
CANISTER SUCT 3000ML PPV (MISCELLANEOUS) ×4 IMPLANT
CATH KIT ON Q 5IN SLV (PAIN MANAGEMENT) ×1 IMPLANT
CATH THORACIC 28FR (CATHETERS) IMPLANT
CATH THORACIC 36FR (CATHETERS) IMPLANT
CATH THORACIC 36FR RT ANG (CATHETERS) IMPLANT
CONN ST 1/4X3/8  BEN (MISCELLANEOUS)
CONN ST 1/4X3/8 BEN (MISCELLANEOUS) IMPLANT
CONT SPEC 4OZ CLIKSEAL STRL BL (MISCELLANEOUS) ×7 IMPLANT
COVER BACK TABLE 60X90IN (DRAPES) ×3 IMPLANT
CUTTER ECHEON FLEX ENDO 45 340 (ENDOMECHANICALS) ×1 IMPLANT
DERMABOND ADVANCED (GAUZE/BANDAGES/DRESSINGS)
DERMABOND ADVANCED .7 DNX12 (GAUZE/BANDAGES/DRESSINGS) IMPLANT
DRAIN CHANNEL 28F RND 3/8 FF (WOUND CARE) IMPLANT
DRAIN CHANNEL 32F RND 10.7 FF (WOUND CARE) IMPLANT
DRAPE LAPAROSCOPIC ABDOMINAL (DRAPES) ×3 IMPLANT
DRAPE WARM FLUID 44X44 (DRAPE) ×3 IMPLANT
DRILL BIT 7/64X5 (BIT) ×1 IMPLANT
ELECT BLADE 4.0 EZ CLEAN MEGAD (MISCELLANEOUS) ×3
ELECT BLADE 6.5 EXT (BLADE) ×3 IMPLANT
ELECT REM PT RETURN 9FT ADLT (ELECTROSURGICAL) ×3
ELECTRODE BLDE 4.0 EZ CLN MEGD (MISCELLANEOUS) ×2 IMPLANT
ELECTRODE REM PT RTRN 9FT ADLT (ELECTROSURGICAL) ×2 IMPLANT
FORCEPS BIOP RJ4 1.8 (CUTTING FORCEPS) IMPLANT
GAUZE SPONGE 4X4 12PLY STRL (GAUZE/BANDAGES/DRESSINGS) ×3 IMPLANT
GAUZE SPONGE 4X4 12PLY STRL LF (GAUZE/BANDAGES/DRESSINGS) ×1 IMPLANT
GLOVE BIO SURGEON STRL SZ 6.5 (GLOVE) ×6 IMPLANT
GOWN STRL REUS W/ TWL LRG LVL3 (GOWN DISPOSABLE) ×8 IMPLANT
GOWN STRL REUS W/TWL LRG LVL3 (GOWN DISPOSABLE) ×15
KIT BASIN OR (CUSTOM PROCEDURE TRAY) ×3 IMPLANT
KIT SUCTION CATH 14FR (SUCTIONS) ×3 IMPLANT
KIT TURNOVER KIT B (KITS) ×3 IMPLANT
MARKER SKIN DUAL TIP RULER LAB (MISCELLANEOUS) IMPLANT
NS IRRIG 1000ML POUR BTL (IV SOLUTION) ×6 IMPLANT
OIL SILICONE PENTAX (PARTS (SERVICE/REPAIRS)) ×3 IMPLANT
PACK CHEST (CUSTOM PROCEDURE TRAY) ×3 IMPLANT
PAD ARMBOARD 7.5X6 YLW CONV (MISCELLANEOUS) ×8 IMPLANT
PASSER SUT SWANSON 36MM LOOP (INSTRUMENTS) ×1 IMPLANT
POUCH SPECIMEN RETRIEVAL 10MM (ENDOMECHANICALS) ×1 IMPLANT
RELOAD STAPLE 45 GOLD REG/THCK (STAPLE) IMPLANT
SCISSORS LAP 5X35 DISP (ENDOMECHANICALS) IMPLANT
SEALANT SURG COSEAL 4ML (VASCULAR PRODUCTS) IMPLANT
SEALANT SURG COSEAL 8ML (VASCULAR PRODUCTS) IMPLANT
SOLUTION ANTI FOG 6CC (MISCELLANEOUS) ×3 IMPLANT
STAPLE RELOAD 45MM GOLD (STAPLE) ×12 IMPLANT
SUT PROLENE 3 0 SH DA (SUTURE) IMPLANT
SUT PROLENE 4 0 RB 1 (SUTURE)
SUT PROLENE 4-0 RB1 .5 CRCL 36 (SUTURE) IMPLANT
SUT SILK  1 MH (SUTURE) ×2
SUT SILK 1 MH (SUTURE) ×8 IMPLANT
SUT SILK 1 TIES 10X30 (SUTURE) IMPLANT
SUT SILK 2 0 SH (SUTURE) IMPLANT
SUT SILK 2 0SH CR/8 30 (SUTURE) IMPLANT
SUT SILK 3 0SH CR/8 30 (SUTURE) ×1 IMPLANT
SUT STEEL 1 (SUTURE) IMPLANT
SUT VIC AB 0 CTX 18 (SUTURE) ×3 IMPLANT
SUT VIC AB 1 CTX 18 (SUTURE) ×1 IMPLANT
SUT VIC AB 1 CTX 36 (SUTURE)
SUT VIC AB 1 CTX36XBRD ANBCTR (SUTURE) IMPLANT
SUT VIC AB 2-0 CTX 36 (SUTURE) ×2 IMPLANT
SUT VIC AB 2-0 UR6 27 (SUTURE) ×1 IMPLANT
SUT VIC AB 3-0 SH 8-18 (SUTURE) IMPLANT
SUT VIC AB 3-0 X1 27 (SUTURE) IMPLANT
SUT VICRYL 0 UR6 27IN ABS (SUTURE) IMPLANT
SUT VICRYL 2 TP 1 (SUTURE) ×1 IMPLANT
SYSTEM SAHARA CHEST DRAIN ATS (WOUND CARE) ×3 IMPLANT
TAPE CLOTH SURG 4X10 WHT LF (GAUZE/BANDAGES/DRESSINGS) ×1 IMPLANT
TAPE UMBILICAL COTTON 1/8X30 (MISCELLANEOUS) IMPLANT
TOWEL GREEN STERILE (TOWEL DISPOSABLE) ×3 IMPLANT
TOWEL GREEN STERILE FF (TOWEL DISPOSABLE) ×3 IMPLANT
TRAP SPECIMEN MUCOUS 40CC (MISCELLANEOUS) IMPLANT
TRAY FOLEY MTR SLVR 16FR STAT (SET/KITS/TRAYS/PACK) ×3 IMPLANT
TROCAR BLADELESS 12MM (ENDOMECHANICALS) IMPLANT
TROCAR BLADELESS 5MM (ENDOMECHANICALS) ×1 IMPLANT
TROCAR XCEL 12X100 BLDLESS (ENDOMECHANICALS) IMPLANT
TUBE CONNECTING 20X1/4 (TUBING) ×4 IMPLANT
TUNNELER SHEATH ON-Q 11GX8 DSP (PAIN MANAGEMENT) ×1 IMPLANT
VALVE DISPOSABLE (MISCELLANEOUS) ×3 IMPLANT
WATER STERILE IRR 1000ML POUR (IV SOLUTION) ×3 IMPLANT

## 2018-04-12 NOTE — Anesthesia Procedure Notes (Addendum)
Procedure Name: Intubation Date/Time: 04/12/2018 7:39 AM Performed by: Kyung Rudd, CRNA Pre-anesthesia Checklist: Patient identified, Emergency Drugs available, Suction available, Patient being monitored and Timeout performed Patient Re-evaluated:Patient Re-evaluated prior to induction Oxygen Delivery Method: Circle system utilized Preoxygenation: Pre-oxygenation with 100% oxygen Induction Type: IV induction Ventilation: Mask ventilation without difficulty Laryngoscope Size: Mac and 4 Grade View: Grade I Tube type: Oral Tube size: 8.5 mm Number of attempts: 1 Airway Equipment and Method: Stylet Placement Confirmation: ETT inserted through vocal cords under direct vision,  positive ETCO2 and breath sounds checked- equal and bilateral Secured at: 21 cm Tube secured with: Tape Dental Injury: Teeth and Oropharynx as per pre-operative assessment

## 2018-04-12 NOTE — Progress Notes (Addendum)
Called into patients room with complaints of nausea. Zofran given. Post Zofran patient continues to dry heave and vomit small amounts of bile. MD Roxan Hockey notified, per MD DC PCA and give one time does of phenergan. Switch pain meds to PO tramadol and oxycodone if needed. Will continue to monitor closely.

## 2018-04-12 NOTE — Anesthesia Postprocedure Evaluation (Signed)
Anesthesia Post Note  Patient: James Sweeney  Procedure(s) Performed: VIDEO BRONCHOSCOPY (N/A ) VIDEO ASSISTED THORACOSCOPY (VATS), WEDGE RESECTION RIGHT MIDDLE LOBE, PLACEMENT OF ON Q CATHETER (Right Chest)     Patient location during evaluation: PACU Anesthesia Type: General Level of consciousness: awake, sedated and oriented Pain management: pain level controlled Vital Signs Assessment: post-procedure vital signs reviewed and stable Respiratory status: spontaneous breathing, nonlabored ventilation, respiratory function stable and patient connected to nasal cannula oxygen Cardiovascular status: blood pressure returned to baseline and stable Postop Assessment: no apparent nausea or vomiting Anesthetic complications: no    Last Vitals:  Vitals:   04/12/18 1300 04/12/18 1329  BP: (!) 104/46 (!) 103/50  Pulse: 63 62  Resp: 19 15  Temp:    SpO2: 97% 99%    Last Pain:  Vitals:   04/12/18 1257  TempSrc:   PainSc: Asleep                 Timber Lucarelli,JAMES TERRILL

## 2018-04-12 NOTE — Anesthesia Procedure Notes (Addendum)
Arterial Line Insertion Start/End6/08/2018 7:00 AM, 04/12/2018 7:08 AM  Patient location: Pre-op. Preanesthetic checklist: patient identified, IV checked, site marked, risks and benefits discussed, surgical consent, monitors and equipment checked, pre-op evaluation and timeout performed Lidocaine 1% used for infiltration and patient sedated Left, radial was placed Catheter size: 20 G Hand hygiene performed , maximum sterile barriers used  and Seldinger technique used Allen's test indicative of satisfactory collateral circulation Attempts: 1 Procedure performed without using ultrasound guided technique. Following insertion, Biopatch and dressing applied. Post procedure assessment: normal  Patient tolerated the procedure well with no immediate complications. Additional procedure comments: Arterial Line placed by Emi Holes, SRNA.[name]

## 2018-04-12 NOTE — Anesthesia Procedure Notes (Signed)
Central Venous Catheter Insertion Performed by: Roderic Palau, MD, anesthesiologist Start/End6/08/2018 6:35 AM, 04/12/2018 6:45 AM Patient location: Pre-op. Preanesthetic checklist: patient identified, IV checked, site marked, risks and benefits discussed, surgical consent, monitors and equipment checked, pre-op evaluation, timeout performed and anesthesia consent Position: Trendelenburg Lidocaine 1% used for infiltration and patient sedated Hand hygiene performed , maximum sterile barriers used  and Seldinger technique used Catheter size: 8 Fr Total catheter length 16. Central line was placed.Double lumen Procedure performed using ultrasound guided technique. Ultrasound Notes:anatomy identified, needle tip was noted to be adjacent to the nerve/plexus identified, no ultrasound evidence of intravascular and/or intraneural injection and image(s) printed for medical record Attempts: 1 Following insertion, dressing applied, line sutured and Biopatch. Post procedure assessment: blood return through all ports  Patient tolerated the procedure well with no immediate complications.

## 2018-04-12 NOTE — H&P (Signed)
[name]       ,[place] [address]             [phone]                    [name] #[id number] Date of Birth: [birth year]  Referring: No ref. provider found Primary Care: Antony Contras, MD  Chief Complaint:    Enlarging right lung mass  History of Present Illness:    James Sweeney 77 y.o. male followed in the  Office, after previously being seen for consideration of surgical resection of a right middle lobe lung mass that had been enlarging . The mass was present on CT of abdomen in June ,2016. Patient quit smoking 40-45 years ago. Repeat ct of chest showed right lung nodule grew 7mm.  . 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 In late March 2017  attempt at navigation bronchoscopy ebus was performed,no definitive pathologic diagnosis was made. The patient did have complication with right pneumothorax requiring chest tube placement.    After the negative biopsy it was recommended recommended to the patient that we proceed with resection of the lesion in the right middle lobe. However after much discussion he canceled his surgery and has not returned past several weeks. [name] He did have a repeat CT scan in November 2017, done at the current emergency room after he fell out of a tree working on a deer stand. We were not notified the scan had been done and the patient was given no information about the lung nodule present on that scan.  Returened in May  for follow up concerning the lung mass with repeat ct of chest .     he has now developed atrial fibrillation and is on Xarelto.   Previous surgery: 01/25/2016 OPERATIVE REPORT PREOPERATIVE DIAGNOSES: Right middle lobe lung mass and mediastinal adenopathy, hypermetabolic and enlarged right adrenal gland, hypermetabolic. POSTOPERATIVE DIAGNOSES: Right middle lobe lung mass and mediastinal adenopathy, hypermetabolic and enlarged right adrenal  gland, hypermetabolic. PROCEDURE: Video bronchoscopy with navigation bronchoscopy to the right middle lobe lesion and EBUS with transbronchial biopsies of #7 and #4 R lymph nodes. SURGEON: Lanelle Bal, MD PATH: Diagnosis Lung, biopsy, right middle lobe - BENIGN LUNG TISSUE. - SEE COMMENT. Microscopic Comment Sections demonstrate degenerated benign lung tissue with scattered predominantly chronic inflammation. As sampled, there is a no atypia and mass lesional tissue is not identified. The findings are nonspecific. Please correlation with clinical and radiologic impression. (RAH:gt, 01/29/16) [name] Pathologist, Electronic Signature (Case signed 01/29/2016)   Current Activity/ Functional Status:  Patient is independent with mobility/ambulation, transfers, ADL's, IADL's.   Zubrod Score: At the time of surgery this patient's most appropriate activity status/level should be described as: []     0    Normal activity, no symptoms [x]     1    Restricted in physical strenuous activity but ambulatory, able to do out light work []     2    Ambulatory and capable of self care, unable to do work activities, up and about               >50 % of waking hours                              []     3    Only limited self  care, in bed greater than 50% of waking hours []     4    Completely disabled, no self care, confined to bed or chair []     5    Moribund   Past Medical History:  Diagnosis Date  . Cervical spondylosis without myelopathy 12/01/2013  . Diabetes mellitus    borderline  . Enlarged prostate   . Frequency of urination   . Hearing deficit   . Heart murmur   . History of kidney stones    removed with Lithotripsy  . HOH (hard of hearing)    right ear  . Hyperlipemia   . Hypertension   . Lumbago 12/01/2013  . Pneumonia 2016  . Polio    right leg smaller than left  . Shortness of breath dyspnea    with exertion    Past Surgical History:  Procedure Laterality  Date  . CATARACT EXTRACTION Bilateral 2014  . VIDEO BRONCHOSCOPY WITH ENDOBRONCHIAL NAVIGATION N/A 01/25/2016   Procedure: VIDEO BRONCHOSCOPY WITH ENDOBRONCHIAL NAVIGATION;  Surgeon: [name], MD;  Location: [facility];  Service: Thoracic;  Laterality: N/A;  . VIDEO BRONCHOSCOPY WITH ENDOBRONCHIAL ULTRASOUND N/A 01/25/2016   Procedure: VIDEO BRONCHOSCOPY WITH ENDOBRONCHIAL ULTRASOUND;  Surgeon: [name], MD;  Location: [facility];  Service: Thoracic;  Laterality: N/A;    Family History  Problem Relation Age of Onset  . Cancer Mother        Stomach  . Cancer - Lung Father   . Heart attack Brother   . Diabetes Brother     Social History   Socioeconomic History  . Marital status: Married    Spouse name: Not on file  . Number of children: 2  . Years of education: Not on file  . Highest education level: Not on file  Occupational History  . Not on file  Social Needs  . Financial resource strain: Not on file  . Food insecurity:    Worry: Not on file    Inability: Not on file  . Transportation needs:    Medical: Not on file    Non-medical: Not on file  Tobacco Use  . Smoking status: Former Smoker    Packs/day: 0.50    Years: 9.00    Pack years: 4.50    Types: Cigarettes    Last attempt to quit: 12/02/1967    Years since quitting: 50.3  . Smokeless tobacco: Former Systems developer    Types: Chew    Quit date: 2000  Substance and Sexual Activity  . Alcohol use: No  . Drug use: No  . Sexual activity: Never  Lifestyle  . Physical activity:    Days per week: Not on file    Minutes per session: Not on file  . Stress: Not on file  Relationships  . Social connections:    Talks on phone: Not on file    Gets together: Not on file    Attends religious service: Not on file    Active member of club or organization: Not on file    Attends meetings of clubs or organizations: Not on file    Relationship status: Not on file  . Intimate partner violence:    Fear of current or ex  partner: Not on file    Emotionally abused: Not on file    Physically abused: Not on file    Forced sexual activity: Not on file  Other Topics Concern  . Not on file  Social History Narrative   Patient is  right handed.   Patient drinks 1 cup caffeine daily.      Pt has been married for 65 years and has 2 children, 4 grandchildren, and 3 great-grandchildren. Lives with everyone except great-grandchildren. He does not clean house, but can shop, do yard work, and drive. 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         Epworth Sleepiness Scale Score:  8      --I have HTN   --I seem to be losing my sex drive   --I wake up to urinate frequently at night   --I awake feeling not rested   --I have borderline diabetes    Social History   Tobacco Use  Smoking Status Former Smoker  . Packs/day: 0.50  . Years: 9.00  . Pack years: 4.50  . Types: Cigarettes  . Last attempt to quit: 12/02/1967  . Years since quitting: 50.3  Smokeless Tobacco Former Systems developer  . Types: Chew  . Quit date: 2000    Social History   Substance and Sexual Activity  Alcohol Use No     Allergies  Allergen Reactions  . Levaquin [Levofloxacin In D5w] Nausea Only  . Penicillins Hives and Other (See Comments)    Has patient had a PCN reaction causing immediate rash, facial/tongue/throat swelling, SOB or lightheadedness with hypotension: unknown, childhood allergy Has patient had a PCN reaction causing severe rash involving mucus membranes or skin necrosis: No Has patient had a PCN reaction that required hospitalization No Has patient had a PCN reaction occurring within the last 10 years: No If all of the above answers are "NO", then may proceed with Cephalosporin use.     Current Facility-Administered Medications  Medication Dose Route Frequency Provider Last Rate Last Dose  . vancomycin (VANCOCIN) IVPB 1000 mg/200 mL premix  1,000 mg Intravenous On Call to OR [name], MD        Facility-Administered Medications Ordered in Other Encounters  Medication Dose Route Frequency Provider Last Rate Last Dose  . ondansetron (ZOFRAN) injection    Anesthesia Intra-op Kyung Rudd, CRNA   4 mg at 04/12/18 4782      Review of Systems:     Cardiac Review of Systems: Y or N  Chest Pain [   n ]  Resting SOB [n   ] Exertional SOB  [ y ]  Orthopnea [ n ]   Pedal Edema [ n  ]    Palpitations [ n ] Syncope  [n]   Presyncope [n   ]  General Review of Systems: [Y] = yes [  ]=no Constitional: recent weight change [  ];  Wt loss over the last 3 months [   ] anorexia [  ]; fatigue [  ]; nausea [  ]; night sweats [  ]; fever [  ]; or chills [  ];          Dental: poor dentition[  ]; Last Dentist visit:   Eye : blurred vision [  ]; diplopia [   ]; vision changes [  ];  Amaurosis fugax[  ]; Resp: cough [  ];  wheezing[ n ];  hemoptysis[n  ]; shortness of breath[  ]; paroxysmal nocturnal dyspnea[  ]; dyspnea on exertion[ y ]; or orthopnea[  ];  GI:  gallstones[  ], vomiting[  ];  dysphagia[  ]; melena[  ];  hematochezia [  ]; heartburn[  ];   Hx of  Colonoscopy[  ];  GU: kidney stones [  ]; hematuria[  ];   dysuria [  ];  nocturia[  ];  history of     obstruction [  ]; urinary frequency [  ]             Skin: rash, swelling[  ];, hair loss[  ];  peripheral edema[  ];  or itching[  ]; Musculosketetal: myalgias[  ];  joint swelling[  ];  joint erythema[  ];  joint pain[  ];  back pain[  ];  Heme/Lymph: bruising[  ];  bleeding[  ];  anemia[  ];  Neuro: TIA[ n ];  headaches[  ];  stroke[  ];  vertigo[  ];  seizures[  ];   paresthesias[  ];  difficulty walking[  ];  Psych:depression[  ]; anxiety[  ];  Endocrine: diabetes[  ];  thyroid dysfunction[  ];  Immunizations: Flu up to date [  ]; Pneumococcal up to date [  ];  Other:  Physical Exam: BP 132/65   Pulse 67   Temp 97.9 F (36.6 C) (Oral)   Resp 20   Ht 5\' 10"  (1.778 m)   Wt 187 lb (84.8 kg)   SpO2 98%   BMI 26.83 kg/m    PHYSICAL EXAMINATION: General appearance: alert, cooperative and appears older than stated age Head: Normocephalic, without obvious abnormality, atraumatic Neck: no adenopathy, no carotid bruit, no JVD, supple, symmetrical, trachea midline and thyroid not enlarged, symmetric, no tenderness/mass/nodules Lymph nodes: Cervical, supraclavicular, and axillary nodes normal. Resp: clear to auscultation bilaterally Back: symmetric, no curvature. ROM normal. No CVA tenderness. Cardio: irrregular rate and rhythm, S1, S2 normal, no murmur, click, rub or gallop GI: soft, non-tender; bowel sounds normal; no masses,  no organomegaly Extremities: extremities normal, a irregular rate consistent with atrial fibrillation traumatic, no cyanosis or edema and Homans sign is negative, no sign of DVT Neurologic: Grossly normal  Diagnostic Studies & Laboratory data:     Recent Radiology Findings: Dg Chest 2 View  Result Date: 04/12/2018 CLINICAL DATA:  Preop right lung mass.  Surgery today. EXAM: CHEST - 2 VIEW COMPARISON:  Chest CT 02/18/2018 FINDINGS: Pulmonary nodule in the right middle lobe measures approximately 2.7 cm. Some of the additional smaller nodules in the right lung as well as peripheral right upper lobe opacity on prior CT are not well seen radiographically. The heart is normal in size. No pleural effusion or pneumothorax. No acute osseous abnormality. IMPRESSION: Right middle lobe pulmonary nodule measures approximately 2.4 cm. Smaller nodules on prior chest CT are not well demonstrated radiographically. Electronically Signed   By: Jeb Levering M.D.   On: 04/12/2018 07:07   Ct Chest Wo Contrast  Result Date: 02/19/2018 CLINICAL DATA:  77 year old male with history of pulmonary nodule. Followup study. EXAM: CT CHEST WITHOUT CONTRAST TECHNIQUE: Multidetector CT imaging of the chest was performed following the standard protocol without IV contrast. COMPARISON:  Chest CT 09/18/2016.  PET-CT  12/30/2016. FINDINGS: Cardiovascular: Heart size is normal. There is no significant pericardial fluid, thickening or pericardial calcification. There is aortic atherosclerosis, as well as atherosclerosis of the great vessels of the mediastinum and the coronary arteries, including calcified atherosclerotic plaque in the left main, left anterior descending, left circumflex and right coronary arteries. Mediastinum/Nodes: Multiple prominent borderline enlarged mediastinal and bilateral hilar lymph nodes are again noted, many of which contain internal coarse calcifications, similar to the prior study. Esophagus is unremarkable in appearance. No axillary lymphadenopathy. Lungs/Pleura: Compared to prior examinations there  is continued interval enlargement of what is now a 2.7 x 2.3 cm nodule in the right middle lobe (axial image 107 of series 8), which extends posteriorly to make contact with the major fissure which appears retracted slightly anteriorly by the lesion. There is some thickening along the major fissure adjacent to the lesion, concerning for early malignant spread of disease. In the anterior aspect of the right upper lobe just deep to the chest wall there is some new ground-glass attenuation and septal thickening, best appreciated on axial image 66 of series 8, which is of uncertain etiology and significance. New 7 mm nodule in the medial aspect of the right upper lobe (axial image 42 of series 8). 10 x 7 mm ground-glass attenuation nodule in the superior segment of the left lower lobe (axial image 49 of series 8). A few other patchy ill-defined areas of ground-glass attenuation are also noted in the medial aspect of the left upper lobe. Small calcified granuloma in the posterior aspect of the right upper lobe. No pleural effusions. Upper Abdomen: Aortic atherosclerosis. Low-attenuation lesions in segments 1, 3, 4A and 4B of the liver, incompletely characterized on today's noncontrast CT examination, but  similar to prior studies, likely to represent cysts. 2.4 x 2.0 cm intermediate attenuation (39 HU) right adrenal nodule, slightly smaller than prior examinations, incompletely characterized but presumably a benign lesions such as a lipid poor adenoma. Musculoskeletal: Several sclerotic lesions in the lateral aspect of the right fourth rib, similar to prior examinations, most compatible with small bone islands. There are no aggressive appearing lytic or blastic lesions noted in the visualized portions of the skeleton. IMPRESSION: 1. Interval growth of large nodule in the right middle lobe which has progressively demonstrated a more aggressive appearance, now intimately associated with the inferior aspect of the right major fissure, currently measuring 2.3 x 2.7 cm, highly concerning for neoplasm. 2. Multiple other smaller pulmonary nodules in the lungs bilaterally, as above. Close attention on followup studies is recommended. 3. Interval development of peripheral gland glass attenuation and subpleural reticulation in the right upper lobe anteriorly. This is of uncertain etiology and significance, but is favored to be of infectious or inflammatory origin. Attention on followup studies is recommended. If there is any clinical concern for hemorrhage in the setting of pulmonary embolism, further evaluation with PE protocol CT scan could also be considered at this time. 4. Aortic atherosclerosis, in addition to left main and 3 vessel coronary artery disease. Assessment for potential risk factor modification, dietary therapy or pharmacologic therapy may be warranted, if clinically indicated. 5. Additional incidental findings, as above. These results will be called to the ordering clinician or representative by the Radiologist Assistant, and communication documented in the PACS or zVision Dashboard. Aortic Atherosclerosis (ICD10-I70.0). Electronically Signed   By: Vinnie Langton M.D.   On: 02/19/2018 14:47    Nm Pet  Image Restag (ps) Skull Base To Thigh  Result Date: 12/30/2016 CLINICAL DATA:  Initial treatment strategy for lung nodule and adrenal mass. EXAM: NUCLEAR MEDICINE PET SKULL BASE TO THIGH TECHNIQUE: 13.0 mCi F-18 FDG was injected intravenously. Full-ring PET imaging was performed from the skull base to thigh after the radiotracer. CT data was obtained and used for attenuation correction and anatomic localization. FASTING BLOOD GLUCOSE:  Value: 105 mg/dl COMPARISON:  Multiple exams, including 09/18/2016 CT scan FINDINGS: NECK No hypermetabolic lymph nodes in the neck. CHEST 1.8 cm right middle lobe pulmonary nodule has maximum standard uptake value 3.4, this has increased  in size from 1.2 cm on 01/01/2016 and increased in maximum SUV from prior measurement of 2.7. Faintly accentuated activity in the right hilum is likely from a small hilar node, maximum SUV 5.6. A faintly hypermetabolic subcarinal node 1.4 cm in short axis on image 97/3, maximum SUV 4.7. Su faintly hypermetabolic left infrahilar region, but with maximum SUV of only 4.2. There some calcified mediastinal lymph nodes compatible with old granulomatous disease. Partially calcified 5 mm right upper lobe subpleural nodule on image 78/3. Ill-defined anterior right upper lobe subpleural density on image 93/3, possibly postinflammatory, with only vague metabolic activity. Small lipoma deep to the left serratus anterior muscle without hypermetabolic activity. Coronary, aortic arch, and branch vessel atherosclerotic vascular disease. ABDOMEN/PELVIS The right adrenal nodule measures 3.0 by 2.0 cm on image 154/3 (previously 2.1 by 2.4 cm on 01/01/2016) and has an internal density of 30 Hounsfield units with maximum SUV of 5.2 (Previously 5.9 on prior PET-CT). The contralateral adrenal gland has a maximum SUV of 4.4, for comparison purposes. Enlarged prostate gland without hypermetabolic activity. Hypodense but not hypermetabolic lesions in segment 4 and segment 3  of the liver, likely cysts. Fullness of the left collecting system without hydroureter. Aortoiliac atherosclerotic vascular disease. There is a lipoma deep to the left gluteus maximus muscle which is probably exerting some mass effect on the left sciatic nerve. SKELETON No focal hypermetabolic activity to suggest skeletal metastasis. IMPRESSION: 1. Slow enlargement of the mildly hypermetabolic right middle lobe nodule, suspicious for low-grade malignancy such as bronchial carcinoid tumor. There are also several small faintly hypermetabolic lymph nodes in the chest as noted above. Another possibility in this case is low grade smoldering granulomatous disease. Tissue diagnosis of the right middle lobe lesion should be considered. 2. Enlarging right adrenal nodule is minimally asymmetrically hypermetabolic, but was previously more hypermetabolic on the prior PET-CT of 2017. This has enlarged mildly since the prior PET-CT, and probably merits surveillance. 3. Other imaging findings of potential clinical significance: Enlarged prostate gland. Coronary, aortic arch, and branch vessel atherosclerotic vascular disease. Lipoma posterior to the left gluteus maximus muscle, displacing the left sciatic nerve anteriorly; there is also a lipoma deep to the left serratus anterior muscle and along the left external oblique muscle. Electronically Signed   By: Van Clines M.D.   On: 12/30/2016 13:52        CLINICAL DATA:  Status post fall 12 feet from deer stand onto back. Patient on blood thinners. Lower back pain, acute onset. Initial encounter.  EXAM: CT CHEST, ABDOMEN, AND PELVIS WITH CONTRAST  TECHNIQUE: Multidetector CT imaging of the chest, abdomen and pelvis was performed following the standard protocol during bolus administration of intravenous contrast.  CONTRAST:  154mL ISOVUE-300 IOPAMIDOL (ISOVUE-300) INJECTION 61%  COMPARISON:  CT of the abdomen and pelvis from 02/29/2016, and CT of the  chest performed 04/17/2016  FINDINGS: CT CHEST FINDINGS  Cardiovascular: Diffuse coronary artery calcifications are seen. The heart is borderline normal in size. Scattered calcification is noted along the thoracic aorta. The great vessels are grossly unremarkable in appearance. There is no evidence of aortic injury. No venous hemorrhage is seen.  Mediastinum/Nodes: A 1.2 cm node is noted at the subcarinal region. A mildly calcified 8 mm node is noted in the periaortic region. No pericardial effusion is identified. The visualized portions of the thyroid gland are unremarkable. No axillary lymphadenopathy is seen.  Lungs/Pleura: Mild bibasilar atelectasis is noted. New peripheral opacity at the right upper lobe could reflect a  mild infectious or inflammatory process. A 1.7 x 1.4 cm nodule is noted at the right middle lobe, mildly increased in size from the prior study and suspicious for primary bronchogenic malignancy. No pleural effusion or pneumothorax is seen. There is no evidence of pulmonary parenchymal contusion.  Musculoskeletal: No acute osseous abnormalities are identified. Minimal nonspecific foci of high density are noted along the right fourth anterolateral rib, likely benign in nature. The visualized musculature is unremarkable in appearance.  CT ABDOMEN PELVIS FINDINGS  Hepatobiliary: Scattered nonspecific hypodensities are noted within the liver, measuring up to 1.6 cm in size. The gallbladder is unremarkable in appearance. The common bile duct remains normal in caliber.  Pancreas: The pancreas is unremarkable in appearance.  Spleen: The spleen is within normal limits.  Adrenals/Urinary Tract: A 3.5 x 3.0 cm right adrenal lesion is noted, with centrally decreased attenuation. The left adrenal gland is unremarkable.  Prominent bilateral renal parapelvic cysts are noted. Nonspecific perinephric stranding is noted bilaterally. There is no evidence  of hydronephrosis. No renal or ureteral stones are identified.  Stomach/Bowel: The stomach is unremarkable in appearance. The small bowel is within normal limits. The appendix is normal in caliber, without evidence of appendicitis. The colon is unremarkable in appearance.  Vascular/Lymphatic: Diffuse calcification is seen along the abdominal aorta and its branches. The abdominal aorta is otherwise grossly unremarkable. The inferior vena cava is grossly unremarkable. No retroperitoneal lymphadenopathy is seen. No pelvic sidewall lymphadenopathy is identified.  Reproductive: The bladder is mildly distended and grossly unremarkable. The prostate is enlarged, measuring 5.5 cm in transverse dimension, with impression on the base of the bladder.  Other: No free air or free fluid is seen within the abdomen or pelvis. There is no evidence of solid or hollow organ injury.  Musculoskeletal: There is a mild compression fracture involving the anterior superior endplate of L1, with only minimal loss of height. There is no evidence of retropulsion or extension to the posterior elements. The visualized musculature is unremarkable in appearance.  IMPRESSION: 1. Mild compression fracture involving the anterior superior endplate of L1, with only minimal loss of height. No evidence of retropulsion or extension to the posterior elements. 2. No additional evidence for traumatic injury to the chest, abdomen or pelvis. 3. 1.7 x 1.4 cm nodule at the right middle lung lobe has mildly increased in size from the prior study and is suspicious for enlarging primary bronchogenic carcinoma. Tissue diagnosis is recommended. 4. New peripheral opacity in the right upper lung lobe could reflect a mild infectious or inflammatory process. 5. 1.2 cm subcarinal node noted, of uncertain significance. 6. Diffuse coronary artery calcifications seen. 7. Small bibasilar atelectasis noted. 8. 3.5 x 3.0 cm right  adrenal lesion noted, with centrally decreased attenuation. This has increased in size from prior studies, and malignancy cannot be excluded. Would correlate with adrenal lab values. Adrenal protocol MRI or CT is recommended for further evaluation. 9. Scattered nonspecific hypodensities within the liver, measuring up to 1.6 cm in size. 10. Diffuse aortic atherosclerosis. 11. Enlarged prostate noted, with impression on the base of the bladder. Would correlate with PSA. 12. Prominent bilateral renal parapelvic cysts noted. These results were called by telephone at the time of interpretation on 09/18/2016 at 9:34 pm to the ER clinical team, who verbally acknowledged these results.   Electronically Signed   By: Garald Balding M.D.   On: 09/18/2016 21:42  Ct Chest Wo Contrast  04/17/2016  CLINICAL DATA:  Followup lung nodule EXAM: CT  CHEST WITHOUT CONTRAST TECHNIQUE: Multidetector CT imaging of the chest was performed following the standard protocol without IV contrast. COMPARISON:  12/12/2015 FINDINGS: Mediastinum/Lymph Nodes: Calcified mediastinal lymph nodes are again noted and appears similar to previous exam. Aortic atherosclerosis. Calcification within the RCA, LAD coronary artery. The trachea appears patent and is midline. Normal appearance of the esophagus. Lungs/Pleura: No pulmonary mass, infiltrate, or effusion. Pulmonary nodule in the right middle lobe measures 1.6 x 1.1 cm, image 84 of series 4. Previously this measured 1.2 x 1.0 cm peer on PET-CT from 01/01/2016 this exhibited malignant range FDG uptake. Calcified granuloma noted within the posterior right upper lobe. Upper abdomen: No acute findings. Stable appearance of right adrenal nodule measuring 2.5 cm, image 131 of series 3. Similar appearance of liver cysts. No acute findings identified within the upper abdomen. Musculoskeletal: Spondylosis noted within the thoracic spine. No aggressive lytic or sclerotic bone lesions  identified. IMPRESSION: 1. Increase in size of right middle lobe lung nodule. This remains suspicious for primary bronchogenic carcinoma. 2. Similar appearance of calcified mediastinal nodes. 3. No change in 2.4 cm right adrenal nodule. 4. Aortic atherosclerosis and multi vessel coronary artery calcification. Electronically Signed   By: Kerby Moors M.D.   On: 04/17/2016 10:12    Nm Pet Image Initial (pi) Skull Base To Thigh  01/01/2016  CLINICAL DATA:  Initial treatment strategy for pulmonary nodule. EXAM: NUCLEAR MEDICINE PET SKULL BASE TO THIGH TECHNIQUE: 9.83 mCi F-18 FDG was injected intravenously. Full-ring PET imaging was performed from the skull base to thigh after the radiotracer. CT data was obtained and used for attenuation correction and anatomic localization. FASTING BLOOD GLUCOSE:  Value: 118 mg/dl COMPARISON:  CT chest dated 12/12/2015. CT abdomen pelvis dated 04/16/2015. FINDINGS: NECK No hypermetabolic lymph nodes in the neck. CHEST 12 x 10 mm right middle lobe nodule (series 6/ image 49), max SUV 2.7, suspicious for primary bronchogenic neoplasm. Mild subpleural nodularity in the posterior right upper lobe (series 6/image 19). No focal consolidation. No pleural effusion or pneumothorax. The heart is normal in size. Coronary atherosclerosis in the LAD and right coronary artery. Atherosclerotic calcifications aortic arch. Thoracic lymphadenopathy, including: --10 mm short axis AP window node (series 4/image 66), max SUV 5.7 --11 mm short axis partially calcified prevascular node (series 4/image 68) --12 mm short axis right hilar node (series 4/image 70), max SUV 6.5 --13 mm short axis partially calcified subcarinal node (series 4/image 32), max SUV 6.9 --11 mm short axis left hilar node (series 4/image 32), max SUV 6.6 Visualized right thyroid is notable for an 18 x 10 mm right thyroid nodule (series 4/image 45). ABDOMEN/PELVIS No abnormal hypermetabolic activity within the liver, pancreas, or  spleen. 2.1 x 2.4 cm right adrenal nodule, unchanged from prior 2016 CT abdomen pelvis, max SUV 5.9. Left renal sinus cysts. Atherosclerotic calcification of the abdominal aorta and branch vessels. Prostatomegaly, with enlargement of the central gland which indents the base the bladder. Bladder is mildly thick-walled although underdistended. Tiny fat containing bilateral inguinal hernias. No hypermetabolic lymph nodes in the abdomen or pelvis. SKELETON No focal hypermetabolic activity to suggest skeletal metastasis. IMPRESSION: 12 x 10 mm hypermetabolic right middle lobe nodule, suspicious for primary bronchogenic neoplasm. Hypermetabolic thoracic lymphadenopathy, most of which is partially calcified, favored to reflect granulomatous disease such as sarcoidosis. Nodal metastases is considered less likely. 2.1 x 2.4 cm hypermetabolic right adrenal nodule, unchanged from June 2016. While technically indeterminate, metastasis not excluded, stability favors a benign etiology such as  an adrenal adenoma. Electronically Signed   By: Julian Hy M.D.   On: 01/01/2016 10:47  I have independently reviewed the above radiology studies  and reviewed the findings with the patient.  Recent Lab Findings: Lab Results  Component Value Date   WBC 3.7 (L) 04/08/2018   HGB 12.6 (L) 04/08/2018   HCT 37.4 (L) 04/08/2018   PLT 134 (L) 04/08/2018   GLUCOSE 98 04/08/2018   ALT 25 04/08/2018   AST 20 04/08/2018   NA 140 04/08/2018   K 3.7 04/08/2018   CL 106 04/08/2018   CREATININE 1.31 (H) 04/08/2018   BUN 23 (H) 04/08/2018   CO2 25 04/08/2018   INR 1.17 04/08/2018   HGBA1C 6.3 (H) 04/08/2018   Chronic Kidney Disease   Stage I     GFR >90  Stage II    GFR 60-89  Stage IIIA GFR 45-59  Stage IIIB GFR 30-44  Stage IV   GFR 15-29  Stage V    GFR  <15  Lab Results  Component Value Date   CREATININE 1.31 (H) 04/08/2018   Estimated Creatinine Clearance: 48.8 mL/min (A) (by C-G formula based on SCr of 1.31  mg/dL (H)).  PFT's: FEV1 3.07 100% Dlco 25  79%  Conclusions: The diffusion defect is consistent with a pulmonary vascular process. Anemia cannot be excluded as a potential cause of the diffusion defect without correcting the observed diffusing capacity for hemoglobin. Pulmonary Function Diagnosis: Minimal Diffusion Defect -Pulmonary Vascular  ECHO: LV EF: 65% - 70%  ------------------------------------------------------------------- History: PMH: Dyspnea. Risk factors: Smokless tabacco user. Former tobacco use. Hypertension. Diabetes mellitus. Dyslipidemia.  ------------------------------------------------------------------- Study Conclusions  - Left ventricle: The cavity size was normal. There was mild  concentric hypertrophy. Systolic function was vigorous. The  estimated ejection fraction was in the range of 65% to 70%. Wall  motion was normal; there were no regional wall motion  abnormalities. Doppler parameters are consistent with abnormal  left ventricular relaxation (grade 1 diastolic dysfunction).  There was no evidence of elevated ventricular filling pressure by  Doppler parameters. - Aortic valve: Trileaflet; normal thickness leaflets. Valve area  (Vmax): 1.58 cm^2. - Aortic root: The aortic root was normal in size. - Left atrium: The atrium was mildly dilated. - Right ventricle: Systolic function was normal. - Right atrium: The atrium was normal in size. - Tricuspid valve: There was trivial regurgitation. - Pulmonary arteries: Systolic pressure was within the normal  range. - Inferior vena cava: The vessel was normal in size. - Pericardium, extracardiac: There was no pericardial effusion.  Transthoracic echocardiography. M-mode, complete 2D, spectral Doppler, and color Doppler. Birthdate: Patient birthdate: [birth year]. Age: Patient is 77 yr old. Sex: Gender: male. BMI: 27.8 kg/m^2. Blood pressure: 126/50 Patient  status: Outpatient. Study date: Study date: 01/23/2016. Study time: 10:07 AM.  -------------------------------------------------------------------  ------------------------------------------------------------------- Left ventricle: The cavity size was normal. There was mild concentric hypertrophy. Systolic function was vigorous. The estimated ejection fraction was in the range of 65% to 70%. Wall motion was normal; there were no regional wall motion abnormalities. The transmitral flow pattern was normal. The deceleration time of the early transmitral flow velocity was normal. The pulmonary vein flow pattern was normal. The tissue Doppler parameters were normal. Doppler parameters are consistent with abnormal left ventricular relaxation (grade 1 diastolic dysfunction). There was no evidence of elevated ventricular filling pressure by Doppler parameters.  ------------------------------------------------------------------- Aortic valve: Trileaflet; normal thickness leaflets. Mobility was not restricted. Sclerosis without stenosis. Doppler: Transvalvular velocity was  within the normal range. There was no stenosis. There was no regurgitation. Peak velocity ratio of LVOT to aortic valve: 0.62. Valve area (Vmax): 1.58 cm^2. Indexed valve area (Vmax): 0.75 cm^2/m^2. Peak gradient (S): 9 mm Hg.  ------------------------------------------------------------------- Aorta: Aortic root: The aortic root was normal in size.  ------------------------------------------------------------------- Mitral valve: Structurally normal valve. Mobility was not restricted. Doppler: Transvalvular velocity was within the normal range. There was no evidence for stenosis. There was no regurgitation. Peak gradient (D): 2 mm Hg.  ------------------------------------------------------------------- Left atrium: The atrium was mildly  dilated.  ------------------------------------------------------------------- Right ventricle: The cavity size was normal. Wall thickness was normal. Systolic function was normal.  ------------------------------------------------------------------- Pulmonic valve: Doppler: Transvalvular velocity was within the normal range. There was no evidence for stenosis.  ------------------------------------------------------------------- Tricuspid valve: Structurally normal valve. Doppler: Transvalvular velocity was within the normal range. There was trivial regurgitation.  ------------------------------------------------------------------- Pulmonary artery: The main pulmonary artery was normal-sized. Systolic pressure was within the normal range.  ------------------------------------------------------------------- Right atrium: The atrium was normal in size.  ------------------------------------------------------------------- Pericardium: There was no pericardial effusion.  ------------------------------------------------------------------- Systemic veins: Inferior vena cava: The vessel was normal in size.  ------------------------------------------------------------------- Measurements  Left ventricle Value Reference LV ID, ED, PLAX chordal 44.7 mm 43 - 52 LV ID, ES, PLAX chordal 28.9 mm 23 - 38 LV fx shortening, PLAX chordal 35 % >=29 LV PW thickness, ED 12.9 mm --------- IVS/LV PW ratio, ED 1.02 <=1.3 Stroke volume, 2D 64 ml --------- Stroke volume/bsa, 2D 30 ml/m^2 --------- LV ejection fraction, 1-p A4C 57 % --------- LV end-diastolic volume, 2-p 751 ml --------- LV end-systolic  volume, 2-p 52 ml --------- LV ejection fraction, 2-p 60 % --------- Stroke volume, 2-p 78 ml --------- LV end-diastolic volume/bsa, 2-p 62 ml/m^2 --------- LV end-systolic volume/bsa, 2-p 25 ml/m^2 --------- Stroke volume/bsa, 2-p 37.1 ml/m^2 --------- LV e&', lateral 8.09 cm/s --------- LV E/e&', lateral 9.38 --------- LV s&', lateral 16 cm/s --------- LV e&', medial 8.29 cm/s --------- LV E/e&', medial 9.16 --------- LV e&', average 8.19 cm/s --------- LV E/e&', average 9.27 ---------  Ventricular septum Value Reference IVS thickness, ED 13.1 mm ---------  LVOT Value Reference LVOT ID, S 18 mm --------- LVOT area 2.54 cm^2 --------- LVOT peak velocity, S 93.1 cm/s --------- LVOT mean velocity, S 67 cm/s --------- LVOT VTI, S 25.2 cm ---------  Aortic valve Value Reference Aortic valve peak velocity, S 150 cm/s --------- Aortic peak gradient, S 9 mm Hg --------- Velocity ratio, peak, LVOT/AV 0.62 --------- Aortic valve area, peak velocity 1.58 cm^2 --------- Aortic valve area/bsa, peak 0.75 cm^2/m^2 --------- velocity  Aorta Value  Reference Aortic root ID, ED 31 mm ---------  Left atrium Value Reference LA ID, A-P, ES 42 mm --------- LA ID/bsa, A-P 2 cm/m^2 <=2.2 LA volume, S 44 ml --------- LA volume/bsa, S 20.9 ml/m^2 --------- LA volume, ES, 1-p A4C 44 ml --------- LA volume/bsa, ES, 1-p A4C 20.9 ml/m^2 --------- LA volume, ES, 1-p A2C 42 ml --------- LA volume/bsa, ES, 1-p A2C 20 ml/m^2 ---------  Mitral valve Value Reference Mitral E-wave peak velocity 75.9 cm/s --------- Mitral A-wave peak velocity 103 cm/s --------- Mitral deceleration time (H) 306 ms 150 - 230 Mitral peak gradient, D 2 mm Hg --------- Mitral E/A ratio, peak 0.8 --------- Mitral maximal regurg velocity, 341 cm/s --------- PISA  Pulmonary arteries Value Reference PA pressure, S, DP 20 mm Hg <=30  Tricuspid valve Value Reference Tricuspid regurg peak velocity 204 cm/s --------- Tricuspid peak RV-RA gradient 17 mm Hg ---------  Right ventricle Value Reference RV ID, ED, PLAX 30.7 mm 19 - 38  Pulmonic valve Value Reference Pulmonic valve peak velocity, S 80.2 cm/s ---------  Legend: (L) and (H) mark values outside specified reference  range.  ------------------------------------------------------------------- Prepared and Electronically Authenticated by  Ena Dawley, M.D. 2017-03-22T12:44:23  Notes Recorded by Sanda Klein, MD on 01/23/2016 at 2:20 PM Both the stress test and the cho are low risk. The aortic valve is sclerotic, but not stenotic. The LVEF is normal. OK to proceed with surgical procedure with Dr. Servando Snare.       Assessment / Plan:   Right middle lobelung nodule enlargingI discussed with the patient and his wife proceeding with surgical resection of the lesion in the right middle lobe.  Initially the patient t was agreeable with proceeding with surgery.  He then canceled surgery.  After reviewing the CT scan y noting the lesion has  continued  to enlarge though slowly, the patient is agreeable with proceeding with surgical resection without further biopsy attempts.  We will plan bronchoscopy right video-assisted thoracoscopy and resection of the right middle lobe mass.  Again the risks and options of surgery have been discussed with the patient and his wife in detail.  Patient again delayed surgery to check the [place] calendar and called  back  When he was agreeable to proceed with surgery.  The goals risks and alternatives of the planned surgical procedure Procedure(s): VIDEO BRONCHOSCOPY (N/A) VIDEO ASSISTED THORACOSCOPY (VATS)/LUNG RESECTION (Right)  have been discussed with the patient in detail. The risks of the procedure including death, infection, stroke, myocardial infarction, bleeding, blood transfusion have all been discussed specifically.  I have quoted Benedetto Coons a 2% of perioperative mortality and a complication rate as high as 40 %. The patient's questions have been answered.ZARYAN YAKUBOV is willing  to proceed with the planned procedure.   [name] MD      [address] [place] [address] Office [phone]   Beeper [phone]  04/12/2018 7:09  AM

## 2018-04-12 NOTE — Progress Notes (Signed)
      [address]       [place] [address]             [phone]    S/p R VATS, wedge resection  Some incisional pain, refusing tylenol due to side effects  BP 140/70   Pulse (!) 55   Temp (!) 97.4 F (36.3 C) (Oral)   Resp (!) 21   Ht 5\' 10"  (1.778 m)   Wt 187 lb (84.8 kg)   SpO2 98%   BMI 26.83 kg/m   Intake/Output Summary (Last 24 hours) at 04/12/2018 1857 Last data filed at 04/12/2018 1600 Gross per 24 hour  Intake 1290 ml  Output 735 ml  Net 555 ml   Doing well early postop  Remo Lipps C. Roxan Hockey, MD [facility] [phone]

## 2018-04-12 NOTE — Transfer of Care (Signed)
Immediate Anesthesia Transfer of Care Note  Patient: James Sweeney  Procedure(s) Performed: VIDEO BRONCHOSCOPY (N/A ) VIDEO ASSISTED THORACOSCOPY (VATS), WEDGE RESECTION RIGHT MIDDLE LOBE, PLACEMENT OF ON Q CATHETER (Right Chest)  Patient Location: PACU  Anesthesia Type:General  Level of Consciousness: awake  Airway & Oxygen Therapy: Patient Spontanous Breathing and Patient connected to face mask oxygen  Post-op Assessment: Report given to RN, Post -op Vital signs reviewed and stable and Patient moving all extremities X 4  Post vital signs: Reviewed and stable  Last Vitals:  Vitals Value Taken Time  BP 94/47 04/12/2018 10:52 AM  Temp    Pulse 44 04/12/2018 10:53 AM  Resp 11 04/12/2018 10:53 AM  SpO2 100 % 04/12/2018 10:53 AM  Vitals shown include unvalidated device data.  Last Pain:  Vitals:   04/12/18 0620  TempSrc:   PainSc: 0-No pain      Patients Stated Pain Goal: 2 (13/08/65 7846)  Complications: No apparent anesthesia complications

## 2018-04-12 NOTE — Progress Notes (Signed)
20 ml's of fentanyl wasted in sink, [employer] as second Therapist, sports to verify.

## 2018-04-12 NOTE — Brief Op Note (Addendum)
      [address]       [place] [address]             [phone]    04/12/2018  10:17 AM  PATIENT:  James Sweeney  77 y.o. male  PRE-OPERATIVE DIAGNOSIS:  RIGHT LUNG MASS  POST-OPERATIVE DIAGNOSIS:  RIGHT LUNG MASS- lymphoid proliferation   PROCEDURE:  Procedure(s): VIDEO BRONCHOSCOPY (N/A) VIDEO ASSISTED THORACOSCOPY (VATS), WEDGE RESECTION RIGHT MIDDLE LOBE, PLACEMENT OF ON Q CATHETER (Right)  SURGEON:  Surgeon(s) and Role:    * [name], MD - Primary  PHYSICIAN ASSISTANT: WAYNE GOLD PA-C  ANESTHESIA:   general  EBL:  50 mL   BLOOD ADMINISTERED:none  DRAINS: (1 92 F) Blake drain(s) in the RIGHT HEMITHORAX   LOCAL MEDICATIONS USED:  OTHER ON-Q PLACED  SPECIMEN:  Source of Specimen:  RML WEDGE  ANR RUL WEDGE  DISPOSITION OF SPECIMEN:  PATHOLOGY  COUNTS:  YES   DICTATION: .Other Dictation: Dictation Number [id number]  PLAN OF CARE: Admit to inpatient   PATIENT DISPOSITION:  PACU - hemodynamically stable.   Delay start of Pharmacological VTE agent (>24hrs) due to surgical blood loss or risk of bleeding: yes  PATH (FROZEN): ATYPICAL LYMPHOPROLIFERATIVE TISSUE, NEG MARGIN

## 2018-04-13 ENCOUNTER — Encounter (HOSPITAL_COMMUNITY): Payer: Self-pay | Admitting: Cardiothoracic Surgery

## 2018-04-13 ENCOUNTER — Inpatient Hospital Stay (HOSPITAL_COMMUNITY): Payer: Medicare Other

## 2018-04-13 LAB — POCT I-STAT 3, ART BLOOD GAS (G3+)
Bicarbonate: 25.4 mmol/L (ref 20.0–28.0)
O2 Saturation: 93 %
Patient temperature: 97.6
TCO2: 27 mmol/L (ref 22–32)
pCO2 arterial: 40.6 mmHg (ref 32.0–48.0)
pH, Arterial: 7.402 (ref 7.350–7.450)
pO2, Arterial: 65 mmHg — ABNORMAL LOW (ref 83.0–108.0)

## 2018-04-13 LAB — CBC
HCT: 35.2 % — ABNORMAL LOW (ref 39.0–52.0)
Hemoglobin: 12.1 g/dL — ABNORMAL LOW (ref 13.0–17.0)
MCH: 34.9 pg — ABNORMAL HIGH (ref 26.0–34.0)
MCHC: 34.4 g/dL (ref 30.0–36.0)
MCV: 101.4 fL — ABNORMAL HIGH (ref 78.0–100.0)
Platelets: 131 10*3/uL — ABNORMAL LOW (ref 150–400)
RBC: 3.47 MIL/uL — ABNORMAL LOW (ref 4.22–5.81)
RDW: 13.1 % (ref 11.5–15.5)
WBC: 9.2 10*3/uL (ref 4.0–10.5)

## 2018-04-13 LAB — BASIC METABOLIC PANEL
Anion gap: 5 (ref 5–15)
BUN: 23 mg/dL — ABNORMAL HIGH (ref 6–20)
CO2: 26 mmol/L (ref 22–32)
Calcium: 8.2 mg/dL — ABNORMAL LOW (ref 8.9–10.3)
Chloride: 105 mmol/L (ref 101–111)
Creatinine, Ser: 1.45 mg/dL — ABNORMAL HIGH (ref 0.61–1.24)
GFR calc Af Amer: 52 mL/min — ABNORMAL LOW (ref >60)
GFR calc non Af Amer: 45 mL/min — ABNORMAL LOW (ref >60)
Glucose, Bld: 163 mg/dL — ABNORMAL HIGH (ref 65–99)
Potassium: 3.9 mmol/L (ref 3.5–5.1)
Sodium: 136 mmol/L (ref 135–145)

## 2018-04-13 MED ORDER — ENOXAPARIN SODIUM 30 MG/0.3ML ~~LOC~~ SOLN
30.0000 mg | SUBCUTANEOUS | Status: DC
  Administered 2018-04-13: 30 mg via SUBCUTANEOUS
  Filled 2018-04-13 (×2): qty 0.3

## 2018-04-13 NOTE — Op Note (Signed)
NAME: James Sweeney, James Sweeney MEDICAL RECORD [id number] ACCOUNT [id number] DATE OF BIRTH:1940/11/08 FACILITY: [facility] LOCATION: [facility] PHYSICIAN:Demica Zook Maryruth Bun, MD  OPERATIVE REPORT DATE OF PROCEDURE:  04/12/2018 PREOPERATIVE DIAGNOSIS:  Right middle lobe lung mass. POSTOPERATIVE DIAGNOSIS:  Right middle lobe lung mass.  Lymphoid proliferation on frozen section.  PROCEDURE PERFORMED:  Video bronchoscopy, right video-assisted thoracoscopy with wedge resection of right middle lobe.  Placement of On-Q.  SURGEON:  Rick Warnick B. Servando Snare, MD.     FIRST ASSISTANT:  Jadene Pierini, [place].  BRIEF HISTORY:  The patient is a 77 year old male who had been seen the year before with a nodule in the right middle lobe.  Previous attempt at biopsy was unsuccessful.  We recommended to the patient proceeding with surgical resection at that time, he  had gone as far as scheduling an operative time and then declined surgery.  Ultimately, he agreed to have continued followup and a repeat scan was performed in April, which showed some enlargement of the middle lobe lesion again was recommended to him  that we proceed with resection for both therapeutic and diagnostic reasons.  After further delay because of scheduling conflicts with the patient and his need to check the [place] before agreeing to any surgical date, he finally agreed to proceeding with  surgical resection.  Risks and options have been discussed in detail on several occasions.  DESCRIPTION OF PROCEDURE:  The patient underwent general endotracheal anesthesia without incident with a single lumen endotracheal tube.  Appropriate timeout was performed and we then proceeded with video bronchoscopy to the subsegmental level in the  right and left tracheobronchial tree without evidence of endobronchial lesions.  The scope was removed and a double lumen endotracheal tube was placed.  The patient was turned in lateral decubitus position.  The right side had preoperatively  been marked.   The right chest was prepped with Betadine and draped in the usual sterile manner.  A second timeout was performed.  We then made a small port incision and placed a 5 mm port into the right chest.  This showed the minor fissure to be completely fused.   The major fissure was better developed.  We then enlarged the initial port site as our working site and placed a second port site lower and more anterior than proceeded with mobilizing the middle lobe.  Since we had no definitive diagnosis, we then noted  where the mass was and did a wedge resection of the middle lobe and submitted to pathology.  Frozen section showed this to be a lymphoid proliferation without evidence of carcinoma.  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  With this in mind, we did not end with negative margins, did not do a complete lobectomy.  Several pulmonary lymph nodes were noted in the apex of the lung.  One of these was resected and sent for pathology.  Mass,  which appeared to be a lymph node,  that was firm and may be a granuloma.  On Q device was tunneled subcuticularly.  A 28 Blake drain was left in the right chest through the port site.  A working incision was closed with interrupted 0 Vicryl, running 3-0  Vicryl in the subcutaneous tissue and 3-0 subcuticular stitch and skin edges.  Dermabond was applied.  The lung was reinflated nicely without air leak.    The patient was weaned and extubated in the operating room and transferred to the recovery  room for postoperative care.  Sponge and needle count was reported as correct at completion of the procedure.  Estimated blood loss was less than 50 mL.  Jadene Pierini was the first assistant on the case and helped with exposure, manipulation of the video camera and wound closure.  AN/NUANCE  D:04/13/2018 T:04/13/2018 JOB:[id number]

## 2018-04-13 NOTE — Progress Notes (Addendum)
Patient ID: James Sweeney, male   DOB: October 19, 1941, 77 y.o.   MRN: [id number] TCTS DAILY ICU PROGRESS NOTE                   [address]            [place] [address]          [phone]   1 Day Post-Op Procedure(s) (LRB): VIDEO BRONCHOSCOPY (N/A) VIDEO ASSISTED THORACOSCOPY (VATS), WEDGE RESECTION RIGHT MIDDLE LOBE, PLACEMENT OF ON Q CATHETER (Right)  Total Length of Stay:  LOS: 1 day   Subjective: Up in chair this morning, some right shoulder discomfort but otherwise feels well  Objective: Vital signs in last 24 hours: Temp:  [97 F (36.1 C)-99 F (37.2 C)] 99 F (37.2 C) (06/11 0417) Pulse Rate:  [54-89] 64 (06/11 0700) Cardiac Rhythm: Atrial fibrillation (06/10 2305) Resp:  [9-30] 18 (06/11 0700) BP: (93-140)/(39-70) 114/52 (06/11 0700) SpO2:  [92 %-100 %] 96 % (06/11 0700) Arterial Line BP: (89-180)/(24-73) 147/52 (06/11 0700)  Filed Weights   04/12/18 0547  Weight: 187 lb (84.8 kg)    Weight change:    Hemodynamic parameters for last 24 hours:    Intake/Output from previous day: 06/10 0701 - 06/11 0700 In: 3090 [P.O.:200; I.V.:2690; IV Piggyback:200] Out: 1710 [Urine:1260; Emesis/NG output:50; Blood:50; Chest Tube:350]  Intake/Output this shift: No intake/output data recorded.  Current Meds: Scheduled Meds: . alfuzosin  10 mg Oral QHS  . amLODipine  10 mg Oral Daily  . bisacodyl  10 mg Oral Daily  . Chlorhexidine Gluconate Cloth  6 each Topical Daily  . finasteride  5 mg Oral QHS  . hydrochlorothiazide  25 mg Oral Daily  . losartan  100 mg Oral Daily  . mouth rinse  15 mL Mouth Rinse BID  . senna-docusate  1 tablet Oral QHS  . simvastatin  20 mg Oral QPM  . sodium chloride flush  10-40 mL Intracatheter Q12H   Continuous Infusions: . dextrose 5 % and 0.45% NaCl 100 mL/hr at 04/13/18 0600  . potassium chloride     PRN Meds:.albuterol, oxyCODONE, polyvinyl alcohol, potassium chloride, sodium chloride flush, traMADol  General  appearance: alert, cooperative and no distress Neurologic: intact Heart: irregularly irregular rhythm Lungs: diminished breath sounds bibasilar Abdomen: soft, non-tender; bowel sounds normal; no masses,  no organomegaly Extremities: extremities normal, atraumatic, no cyanosis or edema and Homans sign is negative, no sign of DVT Wound: No air leak from chest tube  Lab Results: CBC: Recent Labs    04/13/18 0538  WBC 9.2  HGB 12.1*  HCT 35.2*  PLT 131*   BMET:  Recent Labs    04/13/18 0538  NA 136  K 3.9  CL 105  CO2 26  GLUCOSE 163*  BUN 23*  CREATININE 1.45*  CALCIUM 8.2*    CMET: Lab Results  Component Value Date   WBC 9.2 04/13/2018   HGB 12.1 (L) 04/13/2018   HCT 35.2 (L) 04/13/2018   PLT 131 (L) 04/13/2018   GLUCOSE 163 (H) 04/13/2018   ALT 25 04/08/2018   AST 20 04/08/2018   NA 136 04/13/2018   K 3.9 04/13/2018   CL 105 04/13/2018   CREATININE 1.45 (H) 04/13/2018   BUN 23 (H) 04/13/2018   CO2 26 04/13/2018   INR 1.17 04/08/2018   HGBA1C 6.3 (H) 04/08/2018      PT/INR: No results for input(s): LABPROT, INR in the last 72 hours. Radiology: Dg Chest [facility]  1 View  Result Date: 04/12/2018 CLINICAL DATA:  Status post right VATS for resection of the right middle lobe due to pulmonary nodules. EXAM: PORTABLE CHEST 1 VIEW COMPARISON:  PA and lateral chest 04/12/2018.  CT chest 02/18/2018. FINDINGS: Right IJ catheter is in place with the tip projecting in the mid to lower superior vena cava. Right chest tube is also identified with its tip in the lung apex. There is some subcutaneous emphysema along the right chest wall. Mild atelectasis is seen in the left lung base and right mid lung zone. No pneumothorax or pleural effusion. Heart size is upper normal. Atherosclerosis noted. IMPRESSION: Status post VATS for right middle lobe wedge resection. Support tubes and lines project in good position. No pneumothorax or acute disease. Electronically Signed   By: Inge Rise M.D.   On: 04/12/2018 11:13   Chronic Kidney Disease   Stage I     GFR >90  Stage II    GFR 60-89  Stage IIIA GFR 45-59  Stage IIIB GFR 30-44  Stage IV   GFR 15-29  Stage V    GFR  <15  Lab Results  Component Value Date   CREATININE 1.45 (H) 04/13/2018   Estimated Creatinine Clearance: 44.1 mL/min (A) (by C-G formula based on SCr of 1.45 mg/dL (H)).  Assessment/Plan: S/P Procedure(s) (LRB): VIDEO BRONCHOSCOPY (N/A) VIDEO ASSISTED THORACOSCOPY (VATS), WEDGE RESECTION RIGHT MIDDLE LOBE, PLACEMENT OF ON Q CATHETER (Right) Mobilize Diuresis Chronic renal insufficiency present preop stable today Expected blood loss anemia DC A-line and Foley Chest tube to waterseal Hold ARB temporarily Resume anticoagulation for atrial fib tomorrow  James Sweeney 04/13/2018 7:30 AM

## 2018-04-14 ENCOUNTER — Inpatient Hospital Stay (HOSPITAL_COMMUNITY): Payer: Medicare Other

## 2018-04-14 LAB — COMPREHENSIVE METABOLIC PANEL
ALT: 23 U/L (ref 17–63)
AST: 47 U/L — ABNORMAL HIGH (ref 15–41)
Albumin: 3.1 g/dL — ABNORMAL LOW (ref 3.5–5.0)
Alkaline Phosphatase: 45 U/L (ref 38–126)
Anion gap: 6 (ref 5–15)
BUN: 20 mg/dL (ref 6–20)
CO2: 29 mmol/L (ref 22–32)
Calcium: 8 mg/dL — ABNORMAL LOW (ref 8.9–10.3)
Chloride: 98 mmol/L — ABNORMAL LOW (ref 101–111)
Creatinine, Ser: 1.27 mg/dL — ABNORMAL HIGH (ref 0.61–1.24)
GFR calc Af Amer: >60 mL/min (ref >60)
GFR calc non Af Amer: 53 mL/min — ABNORMAL LOW (ref >60)
Glucose, Bld: 154 mg/dL — ABNORMAL HIGH (ref 65–99)
Potassium: 5.9 mmol/L — ABNORMAL HIGH (ref 3.5–5.1)
Sodium: 133 mmol/L — ABNORMAL LOW (ref 135–145)
Total Bilirubin: 1.9 mg/dL — ABNORMAL HIGH (ref 0.3–1.2)
Total Protein: 5 g/dL — ABNORMAL LOW (ref 6.5–8.1)

## 2018-04-14 LAB — CBC
HCT: 35.3 % — ABNORMAL LOW (ref 39.0–52.0)
Hemoglobin: 11.8 g/dL — ABNORMAL LOW (ref 13.0–17.0)
MCH: 34.3 pg — ABNORMAL HIGH (ref 26.0–34.0)
MCHC: 33.4 g/dL (ref 30.0–36.0)
MCV: 102.6 fL — ABNORMAL HIGH (ref 78.0–100.0)
Platelets: 126 10*3/uL — ABNORMAL LOW (ref 150–400)
RBC: 3.44 MIL/uL — ABNORMAL LOW (ref 4.22–5.81)
RDW: 13.6 % (ref 11.5–15.5)
WBC: 7.7 10*3/uL (ref 4.0–10.5)

## 2018-04-14 MED ORDER — LOSARTAN POTASSIUM 50 MG PO TABS
100.0000 mg | ORAL_TABLET | Freq: Every day | ORAL | Status: DC
  Administered 2018-04-14 – 2018-04-16 (×3): 100 mg via ORAL
  Filled 2018-04-14 (×3): qty 2

## 2018-04-14 MED ORDER — TRAMADOL HCL 50 MG PO TABS
50.0000 mg | ORAL_TABLET | Freq: Four times a day (QID) | ORAL | Status: DC | PRN
  Administered 2018-04-14 – 2018-04-16 (×5): 50 mg via ORAL
  Filled 2018-04-14 (×5): qty 1

## 2018-04-14 MED ORDER — ONDANSETRON HCL 4 MG/2ML IJ SOLN
4.0000 mg | Freq: Four times a day (QID) | INTRAMUSCULAR | Status: DC | PRN
  Administered 2018-04-14 – 2018-04-15 (×3): 4 mg via INTRAVENOUS
  Filled 2018-04-14 (×3): qty 2

## 2018-04-14 MED ORDER — ONDANSETRON HCL 4 MG/2ML IJ SOLN: 4.0000 mg | Freq: Four times a day (QID) | INTRAMUSCULAR | Status: DC

## 2018-04-14 MED ORDER — RIVAROXABAN 20 MG PO TABS
20.0000 mg | ORAL_TABLET | Freq: Every day | ORAL | Status: DC
  Administered 2018-04-14 – 2018-04-15 (×2): 20 mg via ORAL
  Filled 2018-04-14 (×2): qty 1

## 2018-04-14 MED ORDER — HYDROCHLOROTHIAZIDE 25 MG PO TABS
25.0000 mg | ORAL_TABLET | Freq: Every day | ORAL | Status: DC
  Administered 2018-04-14 – 2018-04-16 (×3): 25 mg via ORAL
  Filled 2018-04-14 (×3): qty 1

## 2018-04-14 MED ORDER — LOSARTAN POTASSIUM-HCTZ 100-25 MG PO TABS: 1.0000 | ORAL_TABLET | Freq: Every day | ORAL | Status: DC

## 2018-04-14 NOTE — Progress Notes (Addendum)
[address]       [name] [address]             [phone]      2 Days Post-Op Procedure(s) (LRB): VIDEO BRONCHOSCOPY (N/A) VIDEO ASSISTED THORACOSCOPY (VATS), WEDGE RESECTION RIGHT MIDDLE LOBE, PLACEMENT OF ON Q CATHETER (Right) Subjective: Feels ok, very hard of hearing   Objective: Vital signs in last 24 hours: Temp:  [97.2 F (36.2 C)-99.5 F (37.5 C)] 99.4 F (37.4 C) (06/12 0319) Pulse Rate:  [57-112] 74 (06/12 0319) Cardiac Rhythm: Atrial fibrillation;Atrial flutter (06/12 0005) Resp:  [13-21] 19 (06/12 0319) BP: (110-141)/(35-68) 119/35 (06/12 0319) SpO2:  [89 %-97 %] 93 % (06/12 0319) Arterial Line BP: (151-160)/(43-50) 151/43 (06/11 1100) Weight:  [85.2 kg (187 lb 12.8 oz)] 85.2 kg (187 lb 12.8 oz) (06/12 0131)  Hemodynamic parameters for last 24 hours:    Intake/Output from previous day: 06/11 0701 - 06/12 0700 In: 1590.5 [P.O.:1165; I.V.:425.5] Out: 642 [Urine:360; Chest Tube:282] Intake/Output this shift: No intake/output data recorded.  General appearance: alert, cooperative, distracted and no distress Heart: irregularly irregular rhythm Lungs: some right>left basilar crackles Abdomen: soft, nontender Extremities: PAS in place Wound: incis healing well  Lab Results: Recent Labs    04/13/18 0538 04/14/18 0500  WBC 9.2 7.7  HGB 12.1* 11.8*  HCT 35.2* 35.3*  PLT 131* 126*   BMET:  Recent Labs    04/13/18 0538 04/14/18 0500  NA 136 133*  K 3.9 5.9*  CL 105 98*  CO2 26 29  GLUCOSE 163* 154*  BUN 23* 20  CREATININE 1.45* 1.27*  CALCIUM 8.2* 8.0*    PT/INR: No results for input(s): LABPROT, INR in the last 72 hours. ABG    Component Value Date/Time   PHART 7.402 04/13/2018 0535   HCO3 25.4 04/13/2018 0535   TCO2 27 04/13/2018 0535   O2SAT 93.0 04/13/2018 0535   CBG (last 3)  Recent Labs    04/12/18 0555 04/12/18 1052  GLUCAP 109* 203*    Meds Scheduled Meds: . alfuzosin  10 mg Oral QHS  .  amLODipine  10 mg Oral Daily  . bisacodyl  10 mg Oral Daily  . Chlorhexidine Gluconate Cloth  6 each Topical Daily  . enoxaparin (LOVENOX) injection  30 mg Subcutaneous Q24H  . finasteride  5 mg Oral QHS  . hydrochlorothiazide  25 mg Oral Daily  . mouth rinse  15 mL Mouth Rinse BID  . senna-docusate  1 tablet Oral QHS  . simvastatin  20 mg Oral QPM  . sodium chloride flush  10-40 mL Intracatheter Q12H   Continuous Infusions: . dextrose 5 % and 0.45% NaCl 10 mL/hr at 04/13/18 1037  . potassium chloride     PRN Meds:.albuterol, oxyCODONE, polyvinyl alcohol, potassium chloride, sodium chloride flush, traMADol  Xrays Dg Chest Port 1 View  Result Date: 04/13/2018 CLINICAL DATA:  Recent VATS procedure with chest tube in place EXAM: PORTABLE CHEST 1 VIEW COMPARISON:  April 12, 2018 FINDINGS: There is new airspace consolidation in the right base. There is atelectatic change in the left base with small left pleural effusion. Central catheter tip is at the cavoatrial junction. There is a chest tube on the right with minimal pneumothorax on the right. No tension component. Heart size normal. Pulmonary vascularity normal. There is aortic atherosclerosis. No adenopathy. No bone lesions. IMPRESSION: Tube and catheter positions as described. Minimal pneumothorax on the right with chest tube on the right in place.  A small amount of subcutaneous air noted on the right, slightly less than 1 day prior. New consolidation right base concerning for early pneumonia. Stable left base atelectasis with small left pleural effusion. Stable cardiac silhouette. There is aortic atherosclerosis. Aortic Atherosclerosis (ICD10-I70.0). Electronically Signed   By: Lowella Grip III M.D.   On: 04/13/2018 09:03   Dg Chest Port 1 View  Result Date: 04/12/2018 CLINICAL DATA:  Status post right VATS for resection of the right middle lobe due to pulmonary nodules. EXAM: PORTABLE CHEST 1 VIEW COMPARISON:  PA and lateral chest  04/12/2018.  CT chest 02/18/2018. FINDINGS: Right IJ catheter is in place with the tip projecting in the mid to lower superior vena cava. Right chest tube is also identified with its tip in the lung apex. There is some subcutaneous emphysema along the right chest wall. Mild atelectasis is seen in the left lung base and right mid lung zone. No pneumothorax or pleural effusion. Heart size is upper normal. Atherosclerosis noted. IMPRESSION: Status post VATS for right middle lobe wedge resection. Support tubes and lines project in good position. No pneumothorax or acute disease. Electronically Signed   By: Inge Rise M.D.   On: 04/12/2018 11:13    Assessment/Plan: S/P Procedure(s) (LRB): VIDEO BRONCHOSCOPY (N/A) VIDEO ASSISTED THORACOSCOPY (VATS), WEDGE RESECTION RIGHT MIDDLE LOBE, PLACEMENT OF ON Q CATHETER (Right)   1 doing well overall, some incisional and chest tube pain 2 hemodyn stable, with rate controlled afib- restart xerelto 3 renal fxn conts to improve- can restart ARB 4  Poss  d/c chest tube soon , no air leak , mod drainage- 202 yesterday and 180 so far today 5 no leukocytosis', thrombocytopenia is mild, stable 6 H/H stable  7 push rehab and pulm toilet  LOS: 2 days    James Sweeney 04/14/2018  Nausea all day did not walk Check chest xray in am and likely d/c chest tube Path still pending  I have seen and examined James Sweeney and agree with the above assessment  and plan.  [name] MD Beeper [phone] Office [phone] 04/14/2018 5:58 PM

## 2018-04-15 ENCOUNTER — Inpatient Hospital Stay (HOSPITAL_COMMUNITY): Payer: Medicare Other

## 2018-04-15 MED ORDER — LACTULOSE 10 GM/15ML PO SOLN
10.0000 g | Freq: Every day | ORAL | Status: DC | PRN
  Administered 2018-04-15: 10 g via ORAL
  Filled 2018-04-15: qty 15

## 2018-04-15 NOTE — Progress Notes (Addendum)
[address]       [place] [address]             [phone]      3 Days Post-Op Procedure(s) (LRB): VIDEO BRONCHOSCOPY (N/A) VIDEO ASSISTED THORACOSCOPY (VATS), WEDGE RESECTION RIGHT MIDDLE LOBE, PLACEMENT OF ON Q CATHETER (Right) Subjective: Feels better, nausea is improved, feels sore  Objective: Vital signs in last 24 hours: Temp:  [98.2 F (36.8 C)-98.9 F (37.2 C)] 98.9 F (37.2 C) (06/13 0410) Pulse Rate:  [75-82] 82 (06/13 0410) Cardiac Rhythm: Atrial fibrillation (06/13 0700) Resp:  [15-23] 15 (06/13 0410) BP: (105-137)/(70-98) 124/71 (06/13 0410) SpO2:  [91 %-98 %] 91 % (06/13 0410) FiO2 (%):  [2 %] 2 % (06/12 0934)  Hemodynamic parameters for last 24 hours:    Intake/Output from previous day: 06/12 0701 - 06/13 0700 In: 220 [P.O.:220] Out: 1180 [Urine:850; Chest Tube:330] Intake/Output this shift: No intake/output data recorded.  General appearance: alert, cooperative and no distress Heart: irregularly irregular rhythm Lungs: dim in right base, some scattered crackles Abdomen: benign Extremities: no edema or calf tenderness Wound: incis healing well  Lab Results: Recent Labs    04/13/18 0538 04/14/18 0500  WBC 9.2 7.7  HGB 12.1* 11.8*  HCT 35.2* 35.3*  PLT 131* 126*   BMET:  Recent Labs    04/13/18 0538 04/14/18 0500  NA 136 133*  K 3.9 5.9*  CL 105 98*  CO2 26 29  GLUCOSE 163* 154*  BUN 23* 20  CREATININE 1.45* 1.27*  CALCIUM 8.2* 8.0*    PT/INR: No results for input(s): LABPROT, INR in the last 72 hours. ABG    Component Value Date/Time   PHART 7.402 04/13/2018 0535   HCO3 25.4 04/13/2018 0535   TCO2 27 04/13/2018 0535   O2SAT 93.0 04/13/2018 0535   CBG (last 3)  Recent Labs    04/12/18 1052  GLUCAP 203*    Meds Scheduled Meds: . alfuzosin  10 mg Oral QHS  . amLODipine  10 mg Oral Daily  . bisacodyl  10 mg Oral Daily  . Chlorhexidine Gluconate Cloth  6 each Topical Daily  . finasteride  5 mg  Oral QHS  . losartan  100 mg Oral Daily   And  . hydrochlorothiazide  25 mg Oral Daily  . mouth rinse  15 mL Mouth Rinse BID  . rivaroxaban  20 mg Oral Q supper  . senna-docusate  1 tablet Oral QHS  . simvastatin  20 mg Oral QPM  . sodium chloride flush  10-40 mL Intracatheter Q12H   Continuous Infusions: . dextrose 5 % and 0.45% NaCl 10 mL/hr at 04/13/18 1037  . potassium chloride     PRN Meds:.albuterol, ondansetron (ZOFRAN) IV, oxyCODONE, polyvinyl alcohol, potassium chloride, sodium chloride flush, traMADol  Xrays Dg Chest Port 1 View  Result Date: 04/14/2018 CLINICAL DATA:  Chest tube on the right EXAM: PORTABLE CHEST 1 VIEW COMPARISON:  04/13/2018 FINDINGS: Right central line and right chest tube remain in place, unchanged. No visible pneumothorax. Mild cardiomegaly. Vascular congestion and bibasilar airspace opacities with small effusions. Low lung volumes. IMPRESSION: Low lung volumes with cardiomegaly, vascular congestion and bibasilar opacities, worsening since prior study, likely atelectasis. Small effusions. No pneumothorax. Electronically Signed   By: Rolm Baptise M.D.   On: 04/14/2018 08:55    Assessment/Plan: S/P Procedure(s) (LRB): VIDEO BRONCHOSCOPY (N/A) VIDEO ASSISTED THORACOSCOPY (VATS), WEDGE RESECTION RIGHT MIDDLE LOBE, PLACEMENT OF ON Q CATHETER (Right)  1  steady progress overall 2 hemodyn stable in rate controlled afib 3 no new labs today 4 still with mod serosang chest tube drainage, 300 yesterday and 210 so far today- hopefully d/c tube soon 5 d/c central line and on q 6 pulm toilet/rehab- routine    LOS: 3 days   Final path - MALT Lymphoma  Diagnosis 1. 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 Lung, wedge biopsy/resection, Right Upper Lobe Calcified Nodule - LUNG PARENCHYMA WITH OSSIFIED NODULE, CHRONIC INFLAMMATION AND EMPHYSEMATOUS  CHANGES - NO MALIGNANCY IDENTIFIED Microscopic Comment 1. The lung wedge has a well-circumscribed, nodular lymphoid proliferation composed of small to medium sized lymphocytes with irregular nuclear contours. By immunohistochemistry, the neoplastic lymphocytes are positive for CD20 and bcl-2 without aberrant expression of CD5, CD10 or bcl-6. CD21 and CD23 highlight expanded follicular dendritic meshworks. CD138 highlights a minor plasma cell component and background CD3-positive T-cells are present. Flow cytometry performed on the lymph node (See also [phone]) identified a kappa restricted B-cell population comprising 82% of all lymphocytes. 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  [name] MD Beeper [phone] Office [phone] 04/15/2018 9:04 AM     James Sweeney 04/15/2018

## 2018-04-15 NOTE — Progress Notes (Signed)
RN aware of order to d/c cvc.  RN states is speaking with this pt currently.

## 2018-04-15 NOTE — Progress Notes (Signed)
Removed OnQ pump and right chest tube. Pt premedicated and tolerated procedure well. Occlusive dressing applied and patient aware to call for any issues with breathing or dressing leakage. Pt requests taking a nap before walking. Pt resting with call bell within reach.  Will continue to monitor. Payton Emerald, RN

## 2018-04-15 NOTE — Plan of Care (Signed)
  Problem: Education: Goal: Knowledge of General Education information will improve Outcome: Progressing   Problem: Health Behavior/Discharge Planning: Goal: Ability to manage health-related needs will improve Outcome: Progressing   

## 2018-04-15 NOTE — Discharge Instructions (Addendum)
Thoracoscopy, Care After Refer to this sheet in the next few weeks. These instructions provide you with information about caring for yourself after your procedure. Your health care provider may also give you more specific instructions. Your treatment has been planned according to current medical practices, but problems sometimes occur. Call your health care provider if you have any problems or questions after your procedure. What can I expect after the procedure? After your procedure, it is common to feel sore for up to two weeks. Follow these instructions at home:  There are many different ways to close and cover an incision, including stitches (sutures), skin glue, and adhesive strips. Follow your health care provider's instructions about: ? Incision care. ? Bandage (dressing) changes and removal. ? Incision closure removal.  Check your incision area every day for signs of infection. Watch for: ? Redness, swelling, or pain. ? Fluid, blood, or pus.  Take medicines only as directed by your health care provider.  Try to cough often. Coughing helps to protect against lung infection (pneumonia). It may hurt to cough. If this happens, hold a pillow against your chest when you cough.  Take deep breaths. This also helps to protect against pneumonia.  If you were given an incentive spirometer, use it as directed by your health care provider.  Do not take baths, swim, or use a hot tub until your health care provider approves. You may take showers.  Avoid lifting until your health care provider approves.  Avoid driving until your health care provider approves.  Do not travel by airplane after the chest tube is removed until your health care provider approves. Contact a health care provider if:  You have a fever.  Pain medicines do not ease your pain.  You have redness, swelling, or increasing pain in your incision area.  You develop a cough that does not go away, or you are coughing up  mucus that is yellow or green. Get help right away if:  You have fluid, blood, or pus coming from your incision.  There is a bad smell coming from your incision or dressing.  You develop a rash.  You have difficulty breathing.  You cough up blood.  You develop light-headedness or you feel faint.  You develop chest pain.  Your heartbeat feels irregular or very fast. This information is not intended to replace advice given to you by your health care provider. Make sure you discuss any questions you have with your health care provider. Document Released: 05/09/2005 Document Revised: 06/22/2016 Document Reviewed: 07/05/2014 Elsevier Interactive Patient Education  2018 [place] on my medicine - XARELTO (Rivaroxaban)  Why was Xarelto prescribed for you? Xarelto was prescribed for you to reduce the risk of a blood clot forming that can cause a stroke if you have a medical condition called atrial fibrillation (a type of irregular heartbeat).  What do you need to know about xarelto ? Take your Xarelto ONCE DAILY at the same time every day with your evening meal. If you have difficulty swallowing the tablet whole, you may crush it and mix in applesauce just prior to taking your dose.  Take Xarelto exactly as prescribed by your doctor and DO NOT stop taking Xarelto without talking to the doctor who prescribed the medication.  Stopping without other stroke prevention medication to take the place of Xarelto may increase your risk of developing a clot that causes a stroke.  Refill your prescription before you run out.  After discharge, you should have  regular check-up appointments with your healthcare provider that is prescribing your Xarelto.  In the future your dose may need to be changed if your kidney function or weight changes by a significant amount.  What do you do if you miss a dose? If you are taking Xarelto ONCE DAILY and you miss a dose, take it as soon as  you remember on the same day then continue your regularly scheduled once daily regimen the next day. Do not take two doses of Xarelto at the same time or on the same day.   Important Safety Information A possible side effect of Xarelto is bleeding. You should call your healthcare provider right away if you experience any of the following: ? Bleeding from an injury or your nose that does not stop. ? Unusual colored urine (red or dark brown) or unusual colored stools (red or black). ? Unusual bruising for unknown reasons. ? A serious fall or if you hit your head (even if there is no bleeding).  Some medicines may interact with Xarelto and might increase your risk of bleeding while on Xarelto. To help avoid this, consult your healthcare provider or pharmacist prior to using any new prescription or non-prescription medications, including herbals, vitamins, non-steroidal anti-inflammatory drugs (NSAIDs) and supplements.  This website has more information on Xarelto: [email or web address]-benson.[email or web address]

## 2018-04-15 NOTE — Discharge Summary (Signed)
Physician Discharge Summary  Patient ID: James Sweeney MRN: [id number] DOB/AGE: [birth year] 77 y.o.  Admit date: 04/12/2018 Discharge date: 04/16/2018  Admission Diagnoses: Enlarging right lung mass  Discharge Diagnoses:  Active Problems:   Lung mass   Patient Active Problem List   Diagnosis Date Noted  . Lung mass 04/12/2018  . New onset atrial fibrillation ([place]) 01/19/2018  . Pneumothorax 01/25/2016  . Pneumothorax after biopsy 01/25/2016  . Coronary artery calcification seen on CT scan 01/21/2016  . Murmur, cardiac 01/21/2016  . Hyperlipidemia 01/21/2016  . Essential hypertension 01/09/2016  . BPH (benign prostatic hyperplasia) 01/09/2016  . Solitary pulmonary nodule 01/09/2016  . Thrombocytopenia ([place]) 01/09/2016  . Cervical spondylosis without myelopathy 12/01/2013  . Lumbago 12/01/2013  . Diabetes mellitus     History of present illness:  The patient is a 77 year old male who was referred to Lanelle Bal, MD for thoracic surgical consultation due to an enlarging right lung mass.  He had been seen previously in June 2016 when a CT of the abdomen revealed the mass.  Repeat CT of the chest confirmed a right lung nodule. An attempt at navigation bronchoscopy EBUS was not definitive for diagnosis.  He also had a complication of right pneumothorax requiring placement of a chest tube.  He initially agreed to resection but subsequently changed his mind.  He did have a repeat CT scan in November 2017 done following emergency room presentation after falling out of a deer stand.  He return to the office in May of this year due to increased concerns of the lung mass with repeat CT scan.  Additionally he developed atrial fibrillation and was placed on Xarelto.  After reevaluating the patient and do his studies he was again recommended resection and was admitted his hospitalization for the procedure.  Discharged Condition: good  Hospital Course: The patient was admitted electively on  04/12/2018 he was taken to the operating room where he underwent the below described procedure.  He tolerated it well was taken to the surgical intensive care unit in stable condition.  Postoperative hospital course  Patient is overall progressed nicely.  His chest tube was removed on postoperative day #3.  All other invasive lines and devices have been discontinued in the standard fashion.  He has maintained stable hemodynamics and atrial fibrillation with a controlled ventricular response.  He has been resumed on his Xarelto.  His preoperative blood pressure regimen has also been reinstated.  He does have a mild expected acute blood loss anemia and values have stabilized.  Most recent hemoglobin and hematocrit are 11.8/35.3 respectively.  He initially did have a slight bump in his creatinine postoperatively to 1.45 but he is now back in the 1.2 range which is his baseline.  Blood sugars have been under adequate control and he is currently a type II diet controlled diabetic.  Most recent hemoglobin A1c is 6.3.  He will require aggressive long-term nutritional and lifestyle management for this.  Incisions are noted to be healing well without evidence of infection.  Oxygen has been weaned and he maintains good saturations on room air.  Ambulation is showing a steady improvement and he is walking in the halls without difficulty.  At time of discharge the patient is felt to be quite stable.  Consults: None  Significant Diagnostic Studies: Routine postoperative laboratories and serial chest x-rays.  Treatments: surgery:  OPERATIVE REPORT  DATE OF PROCEDURE:  04/12/2018  PREOPERATIVE DIAGNOSIS:  Right middle lobe lung mass.  POSTOPERATIVE DIAGNOSIS:  Right middle lobe lung mass.  Lymphoid proliferation on frozen section.  PROCEDURE PERFORMED:  Video bronchoscopy, right video-assisted thoracoscopy with wedge resection of right middle lobe.  Placement of On-Q.  SURGEON:  Edward B. Servando Snare, MD.      FIRST ASSISTANT:  Jadene Pierini, [place].   Discharge Exam: Blood pressure (!) 124/57, pulse 92, temperature 98.4 F (36.9 C), temperature source Oral, resp. rate 12, height 5\' 10"  (1.778 m), weight 83.1 kg (183 lb 4.8 oz), SpO2 92 %.   General appearance: alert, cooperative and no distress Heart: irregularly irregular rhythm Lungs: clear to auscultation bilaterally Abdomen: benign Extremities: no edema or calf tenderness Wound: incis healing well   Disposition: Discharge disposition: 01-Home or Self Care        Pathology:AL DIAGOSIS Diagnosis 1. 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 Lung, wedge biopsy/resection, Right Upper Lobe Calcified Nodule - LUNG PARENCHYMA WITH OSSIFIED NODULE, CHRONIC INFLAMMATION AND EMPHYSEMATOUS CHANGES - NO MALIGNANCY IDENTIFIED Microscopic Comment 1. The lung wedge has a well-circumscribed, nodular lymphoid proliferation composed of small to medium sized lymphocytes with irregular nuclear contours. By immunohistochemistry, the neoplastic lymphocytes are positive for CD20 and bcl-2 without aberrant expression of CD5, CD10 or bcl-6. CD21 and CD23 highlight expanded follicular dendritic meshworks. CD138 highlights a minor plasma cell component and background CD3-positive T-cells are present. Flow cytometry performed on the lymph node (See also [phone]) identified a kappa restricted B-cell population comprising 82% of all lymphocytes. 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 Dr. Servando Snare was notified of these results on April 15, 2018. Thressa Sheller MD Pathologist, Electronic Signature (Case signed 04/15/2018) Intraoperative Diagnosis 1. FROZEN SECTION DIAGNOSIS, RIGHT MIDDLE LOBE WEDGE: ATYPICAL LYMPHOID PROLIFERATION.  DEFER TO PERMANENT SECTIONS. (NK) Specimen Gross and Clinical Information Specimen(s) Obtained: 1. Lung, wedge biopsy/resection, Right Middle Lobe 2. Lung, wedge biopsy/resection, Right Upper Lobe Calcified Nodule 1 of 2 FINAL for IMIR, BRUMBACH ([id number]) Specimen Clinical Information 1. Right lung mass (nt) Gross 1. The specimen is received fresh for rapid intraoperative consultation and consists of a 23-gram, 8.1 x 5.0 x 3.7 cm wedge of tan-pink lung parenchyma with a stapled resection margin. The pleural surface is tan-pink with mild anthracosis. There is a firm subpleural nodule identified, and the overlying pleural surface is inked black. The nodule measures less than 0.5 cm from the stapled resection margin. Sectioning reveals a 3.0 x 2.5 x 2.1 cm tan, firm, well circumscribed lesion which abuts the pleural surface. Representative section is submitted for frozen section analysis. The lesion displays focal calcifications, and a representative portion of tissue is placed in RPMI for possible flow cytometry. The uninvolved parenchyma is spongy tan-red. Representative sections are submitted in eight cassettes. A = margin frozen section remnant. B = lesion frozen section remnant. C - G = additional sections of lesion. H = grossly uninvolved parenchyma. 2. The specimen is received in saline and consists of a 5.2 x 1.0 x 0.7 cm wedge of tan-pink lung parenchyma with a stapled resection margin. There is a 0.7 cm in greatest dimension tan-gray, firm, calcified nodule identified underlying the pleural surface. The uninvolved parenchyma is spongy tan-red. The specimen is bisected and entirely submitted in two cassettes following decalcification. (KL:ecj 04/12/2018) Stain(s) used in Diagnosis: The following stain(s) were used in diagnosing the case: BCl 6 , CD 5, BCl 2, CD 138, CD 3, CD-10,  CD 23, CD 21, CD 20. The control(s) stained appropriately. Disclaimer Some of these  immunohistochemical stains may have been developed and the performance characteristics determined by [facility]. Some may not have been cleared or approved by the U.S. Food and Drug Administration. The FDA has determined that such clearance or approval is not necessary. This test is used for clinical purposes. It should not be regarded as investigational or for research. This laboratory is certified under the [place] (CLIA-88) as qualified to perform high complexity clinical laboratory testing. Report signed out from the following location(s) Technical Component was performed at [facility], [place], [place] [address]. CLIA #: [id number], Technical component and interpretation was performed at [facility] [place], [place], [place] [address]. CLIA #: [id number], 2 of   Discharge Instructions    Discharge patient   Complete by:  As directed    Discharge disposition:  01-Home or Self Care   Discharge patient date:  04/16/2018     Allergies as of 04/16/2018      Reactions   Levaquin [levofloxacin In D5w] Nausea Only   Penicillins Hives, Other (See Comments)   Has patient had a PCN reaction causing immediate rash, facial/tongue/throat swelling, SOB or lightheadedness with hypotension: unknown, childhood allergy Has patient had a PCN reaction causing severe rash involving mucus membranes or skin necrosis: No Has patient had a PCN reaction that required hospitalization No Has patient had a PCN reaction occurring within the last 10 years: No If all of the above answers are "NO", then may proceed with Cephalosporin use.      Medication List    TAKE these medications   alfuzosin 10 MG 24 hr tablet Commonly known as:  UROXATRAL Take 10 mg by mouth at bedtime.   amLODipine 10 MG tablet Commonly known as:  NORVASC Take 10 mg by mouth daily.   CALCIUM 600+D3 PO Take 1 tablet by  mouth every evening.   finasteride 5 MG tablet Commonly known as:  PROSCAR Take 5 mg by mouth at bedtime.   Fish Oil 1200 MG Caps Take 1,200 mg by mouth every evening.   Iron 28 MG Tabs Take 28 mg by mouth every evening.   losartan-hydrochlorothiazide 100-25 MG tablet Commonly known as:  HYZAAR Take 1 tablet by mouth daily.   rivaroxaban 20 MG Tabs tablet Commonly known as:  XARELTO Take 1 tablet (20 mg total) by mouth daily with supper.   Selenium-Vitamin E 50-400 MCG-UNIT Caps Take 1 capsule by mouth every evening.   simvastatin 20 MG tablet Commonly known as:  ZOCOR Take 20 mg by mouth every evening.   SUPER C COMPLEX PO Take 1 capsule by mouth every evening. Super C 1000   SYSTANE 0.4-0.3 % Soln Generic drug:  Polyethyl Glycol-Propyl Glycol Place 1 drop into both eyes 2 (two) times daily as needed (for dry/irritated eyes.).   traMADol 50 MG tablet Commonly known as:  ULTRAM Take 1 tablet (50 mg total) by mouth every 6 (six) hours as needed (pain). What changed:    how much to take  reasons to take this   Vitamin B-12 5000 MCG Subl Place 1 tablet under the tongue every evening.      Follow-up Information    [name], MD Follow up.   Specialty:  Cardiothoracic Surgery Why:  Appointment see the PA on 05/03/2018 at 2:30 PM.  Please obtain a chest x-ray at [facility]  imaging at 2 PM.  [facility] is located in the same office complex. Contact information: [address] [place] [address] [phone]           Signed: John Giovanni 04/16/2018, 10:39 AM

## 2018-04-16 ENCOUNTER — Inpatient Hospital Stay (HOSPITAL_COMMUNITY): Payer: Medicare Other

## 2018-04-16 MED ORDER — TRAMADOL HCL 50 MG PO TABS: 50.0000 mg | ORAL_TABLET | Freq: Four times a day (QID) | ORAL | 0 refills | Status: DC | PRN

## 2018-04-16 MED ORDER — BISACODYL 10 MG RE SUPP
10.0000 mg | Freq: Every day | RECTAL | Status: DC | PRN
  Administered 2018-04-16: 10 mg via RECTAL
  Filled 2018-04-16: qty 1

## 2018-04-16 NOTE — Progress Notes (Signed)
Pt/family given discharge instructions, medication lists, follow up appointments, and when to call the doctor.  Pt/family verbalizes understanding. Pt given signs and symptoms of infection. Caswell Alvillar McClintock, RN    

## 2018-04-16 NOTE — Progress Notes (Addendum)
[address]       [place] [address]             [phone]      4 Days Post-Op Procedure(s) (LRB): VIDEO BRONCHOSCOPY (N/A) VIDEO ASSISTED THORACOSCOPY (VATS), WEDGE RESECTION RIGHT MIDDLE LOBE, PLACEMENT OF ON Q CATHETER (Right) Subjective: Feels well, c/o constipation  Objective: Vital signs in last 24 hours: Temp:  [98.4 F (36.9 C)-98.9 F (37.2 C)] 98.4 F (36.9 C) (06/14 0433) Pulse Rate:  [76-92] 92 (06/14 0433) Cardiac Rhythm: Atrial fibrillation (06/14 0700) Resp:  [12-18] 12 (06/14 0433) BP: (113-128)/(60-74) 117/65 (06/14 0433) SpO2:  [90 %-93 %] 92 % (06/14 0433) Weight:  [83.1 kg (183 lb 4.8 oz)] 83.1 kg (183 lb 4.8 oz) (06/14 0433)  Hemodynamic parameters for last 24 hours:    Intake/Output from previous day: 06/13 0701 - 06/14 0700 In: 240 [P.O.:240] Out: 480 [Urine:400; Chest Tube:80] Intake/Output this shift: No intake/output data recorded.  General appearance: alert, cooperative and no distress Heart: irregularly irregular rhythm Lungs: clear to auscultation bilaterally Abdomen: benign Extremities: no edema or calf tenderness Wound: incis healing well  Lab Results: Recent Labs    04/14/18 0500  WBC 7.7  HGB 11.8*  HCT 35.3*  PLT 126*   BMET:  Recent Labs    04/14/18 0500  NA 133*  K 5.9*  CL 98*  CO2 29  GLUCOSE 154*  BUN 20  CREATININE 1.27*  CALCIUM 8.0*    PT/INR: No results for input(s): LABPROT, INR in the last 72 hours. ABG    Component Value Date/Time   PHART 7.402 04/13/2018 0535   HCO3 25.4 04/13/2018 0535   TCO2 27 04/13/2018 0535   O2SAT 93.0 04/13/2018 0535   CBG (last 3)  No results for input(s): GLUCAP in the last 72 hours.  Meds Scheduled Meds: . alfuzosin  10 mg Oral QHS  . amLODipine  10 mg Oral Daily  . bisacodyl  10 mg Oral Daily  . Chlorhexidine Gluconate Cloth  6 each Topical Daily  . finasteride  5 mg Oral QHS  . losartan  100 mg Oral Daily   And  .  hydrochlorothiazide  25 mg Oral Daily  . mouth rinse  15 mL Mouth Rinse BID  . rivaroxaban  20 mg Oral Q supper  . senna-docusate  1 tablet Oral QHS  . simvastatin  20 mg Oral QPM   Continuous Infusions: . dextrose 5 % and 0.45% NaCl 10 mL/hr at 04/13/18 1037  . potassium chloride     PRN Meds:.albuterol, lactulose, ondansetron (ZOFRAN) IV, oxyCODONE, polyvinyl alcohol, potassium chloride, traMADol  Xrays Dg Chest Port 1 View  Result Date: 04/15/2018 CLINICAL DATA:  Chest tube EXAM: PORTABLE CHEST 1 VIEW COMPARISON:  04/14/2018 FINDINGS: Right chest tube and central line remain in place, unchanged. Tiny right apical pneumothorax. Small amount subcutaneous air in the right chest wall. Bibasilar airspace opacities likely reflect atelectasis. Small bilateral effusions. IMPRESSION: Small residual right apical pneumothorax, less than 5%. Bibasilar atelectasis and small effusions. Electronically Signed   By: Rolm Baptise M.D.   On: 04/15/2018 07:54    Assessment/Plan: S/P Procedure(s) (LRB): VIDEO BRONCHOSCOPY (N/A) VIDEO ASSISTED THORACOSCOPY (VATS), WEDGE RESECTION RIGHT MIDDLE LOBE, PLACEMENT OF ON Q CATHETER (Right)   1 doing well 2 Check CXR  3 will try suppository this am, got lactulose, + flatus 4 probable home later today unless new issues   LOS: 4 days    [place] E  Gold 04/16/2018  Home today if getting bowels moving Medical oncology consult as outpatient  I have seen and examined James Sweeney and agree with the above assessment  and plan.  [name] MD Beeper [phone] Office [phone] 04/16/2018 10:56 AM

## 2018-04-16 NOTE — Care Management Important Message (Signed)
Important Message  Patient Details  Name: James Sweeney MRN: [id number] Date of Birth: November 16, 1940   Medicare Important Message Given:  Yes    Tris Howell P Fletcher R[place]un 04/16/2018, 3:43 PM

## 2018-04-16 NOTE — Care Management Note (Signed)
Case Management Note Marvetta Gibbons RN, BSN Unit 4E-Case Manager [phone]  Patient Details  Name: James Sweeney MRN: [id number] Date of Birth: 1941/07/09  Subjective/Objective:   Pt admitted s/p VATS with wedge resection                Action/Plan: PTA pt lived at home, plan to return home. No CM needs noted for transition home.   Expected Discharge Date:  04/16/18               Expected Discharge Plan:  Home/Self Care  In-House Referral:  NA  Discharge planning Services  CM Consult  Post Acute Care Choice:  NA Choice offered to:  NA  DME Arranged:    DME Agency:     HH Arranged:    HH Agency:     Status of Service:  Completed, signed off  If discussed at [place] of Stay Meetings, dates discussed:    Discharge Disposition: home/self care   Additional Comments:  Dawayne Patricia, RN 04/16/2018, 10:41 AM

## 2018-04-19 ENCOUNTER — Encounter: Payer: Self-pay | Admitting: *Deleted

## 2018-04-19 ENCOUNTER — Encounter: Payer: Self-pay | Admitting: Cardiology

## 2018-04-19 ENCOUNTER — Ambulatory Visit: Payer: Medicare Other | Admitting: Cardiology

## 2018-04-19 ENCOUNTER — Ambulatory Visit: Payer: Medicare Other | Admitting: Adult Health

## 2018-04-19 VITALS — BP 132/64 | HR 87 | Ht 70.0 in | Wt 185.4 lb

## 2018-04-19 DIAGNOSIS — I481 Persistent atrial fibrillation: Secondary | ICD-10-CM | POA: Diagnosis not present

## 2018-04-19 DIAGNOSIS — I10 Essential (primary) hypertension: Secondary | ICD-10-CM | POA: Diagnosis not present

## 2018-04-19 DIAGNOSIS — Z79899 Other long term (current) drug therapy: Secondary | ICD-10-CM

## 2018-04-19 DIAGNOSIS — R918 Other nonspecific abnormal finding of lung field: Secondary | ICD-10-CM | POA: Diagnosis not present

## 2018-04-19 DIAGNOSIS — I4819 Other persistent atrial fibrillation: Secondary | ICD-10-CM

## 2018-04-19 DIAGNOSIS — I2510 Atherosclerotic heart disease of native coronary artery without angina pectoris: Secondary | ICD-10-CM

## 2018-04-19 DIAGNOSIS — N289 Disorder of kidney and ureter, unspecified: Secondary | ICD-10-CM | POA: Diagnosis not present

## 2018-04-19 DIAGNOSIS — Z7901 Long term (current) use of anticoagulants: Secondary | ICD-10-CM

## 2018-04-19 LAB — BASIC METABOLIC PANEL
BUN/Creatinine Ratio: 17 (ref 10–24)
BUN: 20 mg/dL (ref 8–27)
CO2: 25 mmol/L (ref 20–29)
Calcium: 8.9 mg/dL (ref 8.6–10.2)
Chloride: 99 mmol/L (ref 96–106)
Creatinine, Ser: 1.19 mg/dL (ref 0.76–1.27)
GFR calc Af Amer: 68 mL/min/{1.73_m2} (ref >59)
GFR calc non Af Amer: 59 mL/min/{1.73_m2} — ABNORMAL LOW (ref >59)
Glucose: 126 mg/dL — ABNORMAL HIGH (ref 65–99)
Potassium: 4.2 mmol/L (ref 3.5–5.2)
Sodium: 137 mmol/L (ref 134–144)

## 2018-04-19 NOTE — Progress Notes (Signed)
Oncology Nurse Navigator Documentation  Oncology Nurse Navigator Flowsheets 04/19/2018  Navigator Location [facility]  Referral date to RadOnc/MedOnc 04/19/2018  Navigator Encounter Type Other/I received referral on [name] today.  I updated New Patient Coordinator to schedule with Dr. Irene Limbo due to DX of lymphoma.   Treatment Phase Pre-Tx/Tx Discussion  Barriers/Navigation Needs Coordination of Care  Interventions Coordination of Care  Coordination of Care Other  Acuity Level 1  Time Spent with Patient 30

## 2018-04-19 NOTE — Assessment & Plan Note (Signed)
Controlled.  

## 2018-04-19 NOTE — Assessment & Plan Note (Signed)
Low risk Myoview in 2017

## 2018-04-19 NOTE — Assessment & Plan Note (Signed)
Xarelto- [facility] VASc=3

## 2018-04-19 NOTE — Assessment & Plan Note (Signed)
S/P surgical resection 04/12/18- path pending

## 2018-04-19 NOTE — Patient Instructions (Addendum)
Medication Instructions:  STOP- Aspirin  If you need a refill on your cardiac medications before your next appointment, please call your pharmacy.  Labwork: BMP Today HERE IN OUR OFFICE AT [facility]  Take the provided lab slips with you to the lab for your blood draw.   You will NOT need to fast    Testing/Procedures: None Ordered  Follow-Up: Your physician wants you to follow-up in: 3 Months with Dr Sallyanne Kuster.   Please bring all medications at next office visit.    Thank you for choosing [facility] at [facility]!!

## 2018-04-19 NOTE — Progress Notes (Signed)
04/19/2018 James Sweeney   1941/02/08  [id number]  Primary Physician Antony Contras, MD Primary Cardiologist: Dr Sallyanne Kuster  HPI:  77 y/o male followed by Dr Sallyanne Kuster with a history of coronary Ca++ on CT scan, low risk Myoview in 2017, and new onset AF noted in April 2019- asymptomatic. Prior echo in 2017 showed normal LVF with mild LAE. He was treated medically, Xarelto added. He has a Rt lung mass being followed by Dr Servando Snare and he recently underwent resection 04/12/18. He was discharged 04/15/18. Because of his recent admission he was contacted and told his OV today would be rescheduled but he forgot and showed up to the office today. He was added on to my schedule.   He tolerated his surgery well according to the hospital notes. He remains in AF with CVR and asymptomatic. I think he is back on his Xarelto- he doesn't know the names of his medications and did not bring them with him. He is married but he came alone to the office today. He did tell me he started taking an 81 mg ASA though I explained that was not what Dr Sallyanne Kuster wanted him to do based on his LOV note. The patient will stop the ASA.    Current Outpatient Medications  Medication Sig Dispense Refill  . alfuzosin (UROXATRAL) 10 MG 24 hr tablet Take 10 mg by mouth at bedtime.     [name] amLODipine (NORVASC) 10 MG tablet Take 10 mg by mouth daily.    . Ascorbic Acid (SUPER C COMPLEX PO) Take 1 capsule by mouth every evening. Super C 1000    . Calcium Carb-Cholecalciferol (CALCIUM 600+D3 PO) Take 1 tablet by mouth every evening.    . Cyanocobalamin (VITAMIN B-12) 5000 MCG SUBL Place 1 tablet under the tongue every evening.     . Ferrous Sulfate (IRON) 28 MG TABS Take 28 mg by mouth every evening.    . finasteride (PROSCAR) 5 MG tablet Take 5 mg by mouth at bedtime.     [name] losartan-hydrochlorothiazide (HYZAAR) 100-25 MG tablet Take 1 tablet by mouth daily.    . Omega-3 Fatty Acids (FISH OIL) 1200 MG CAPS Take 1,200 mg by mouth every  evening.    Vladimir Faster Glycol-Propyl Glycol (SYSTANE) 0.4-0.3 % SOLN Place 1 drop into both eyes 2 (two) times daily as needed (for dry/irritated eyes.).    [name] rivaroxaban (XARELTO) 20 MG TABS tablet Take 1 tablet (20 mg total) by mouth daily with supper. 30 tablet 30  . simvastatin (ZOCOR) 20 MG tablet Take 20 mg by mouth every evening.    . traMADol (ULTRAM) 50 MG tablet Take 1 tablet (50 mg total) by mouth every 6 (six) hours as needed (pain). 30 tablet 0  . Vitamin Mixture (SELENIUM-VITAMIN E) 50-400 MCG-UNIT CAPS Take 1 capsule by mouth every evening.     No current facility-administered medications for this visit.     Allergies  Allergen Reactions  . Levaquin [Levofloxacin In D5w] Nausea Only  . Penicillins Hives and Other (See Comments)    Has patient had a PCN reaction causing immediate rash, facial/tongue/throat swelling, SOB or lightheadedness with hypotension: unknown, childhood allergy Has patient had a PCN reaction causing severe rash involving mucus membranes or skin necrosis: No Has patient had a PCN reaction that required hospitalization No Has patient had a PCN reaction occurring within the last 10 years: No If all of the above answers are "NO", then may proceed with Cephalosporin use.  Past Medical History:  Diagnosis Date  . Cervical spondylosis without myelopathy 12/01/2013  . Diabetes mellitus    borderline  . Enlarged prostate   . Frequency of urination   . Hearing deficit   . Heart murmur   . History of kidney stones    removed with Lithotripsy  . HOH (hard of hearing)    right ear  . Hyperlipemia   . Hypertension   . Lumbago 12/01/2013  . Pneumonia 2016  . Polio    right leg smaller than left  . Shortness of breath dyspnea    with exertion    Social History   Socioeconomic History  . Marital status: Married    Spouse name: Not on file  . Number of children: 2  . Years of education: Not on file  . Highest education level: Not on file    Occupational History  . Not on file  Social Needs  . Financial resource strain: Not on file  . Food insecurity:    Worry: Not on file    Inability: Not on file  . Transportation needs:    Medical: Not on file    Non-medical: Not on file  Tobacco Use  . Smoking status: Former Smoker    Packs/day: 0.50    Years: 9.00    Pack years: 4.50    Types: Cigarettes    Last attempt to quit: 12/02/1967    Years since quitting: 50.4  . Smokeless tobacco: Former Systems developer    Types: Chew    Quit date: 2000  Substance and Sexual Activity  . Alcohol use: No  . Drug use: No  . Sexual activity: Never  Lifestyle  . Physical activity:    Days per week: Not on file    Minutes per session: Not on file  . Stress: Not on file  Relationships  . Social connections:    Talks on phone: Not on file    Gets together: Not on file    Attends religious service: Not on file    Active member of club or organization: Not on file    Attends meetings of clubs or organizations: Not on file    Relationship status: Not on file  . Intimate partner violence:    Fear of current or ex partner: Not on file    Emotionally abused: Not on file    Physically abused: Not on file    Forced sexual activity: Not on file  Other Topics Concern  . Not on file  Social History Narrative   Patient is right handed.   Patient drinks 1 cup caffeine daily.      Pt has been married for 89 years and has 2 children, 4 grandchildren, and 3 great-grandchildren. Lives with everyone except great-grandchildren. He does not clean house, but can shop, do yard work, and drive. 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         Epworth Sleepiness Scale Score:  8      --I have HTN   --I seem to be losing my sex drive   --I wake up to urinate frequently at night   --I awake feeling not rested   --I have borderline diabetes     Family History  Problem Relation Age of Onset  . Cancer Mother        Stomach  . Cancer  - Lung Father   . Heart attack Brother   . Diabetes Brother  Review of Systems: General: negative for chills, fever, night sweats or weight changes.  Cardiovascular: negative for chest pain, dyspnea on exertion, edema, orthopnea, palpitations, paroxysmal nocturnal dyspnea or shortness of breath Dermatological: negative for rash Respiratory: negative for cough or wheezing Urologic: negative for hematuria Abdominal: negative for nausea, vomiting, diarrhea, bright red blood per rectum, melena, or hematemesis Neurologic: negative for visual changes, syncope, or dizziness All other systems reviewed and are otherwise negative except as noted above.    Blood pressure 132/64, pulse 87, height 5\' 10"  (1.778 m), weight 185 lb 6.4 oz (84.1 kg).  General appearance: alert, cooperative and no distress Lungs: clear to auscultation bilaterally and dressing Rt lateral chest  Heart: irregularly irregular rhythm Extremities: extremities normal, atraumatic, no cyanosis or edema Skin: Skin color, texture, turgor normal. No rashes or lesions Neurologic: Grossly normal  EKG 04/08/18- AF with VR-64  ASSESSMENT AND PLAN:   Renal insufficiency Transient SCr bump with lung surgery  Atrial fibrillation (HCC) CVR- asymptomatic  Chronic anticoagulation Xarelto- CHADS VASc=3  Coronary artery calcification seen on CT scan Low risk Myoview in 2017  Lung mass S/P surgical resection 04/12/18- path pending  Essential hypertension Controlled   PLAN  Stop ASA, continue Xarelto. I asked him to bring his medications with him to his next OV. I'll review with Dr Sallyanne Kuster, not sure what the long term plans are for his atrial fibrillation. He is not a good candidate for any intervention now since he is early post op (path pending), and he appears asymptomatic.   Kerin Ransom PA-C 04/19/2018 10:52 AM

## 2018-04-19 NOTE — Progress Notes (Signed)
Bmp

## 2018-04-19 NOTE — Assessment & Plan Note (Signed)
Transient SCr bump with lung surgery

## 2018-04-19 NOTE — Progress Notes (Signed)
I would wait at least 3 months before we make any change in plans MCr

## 2018-04-19 NOTE — Assessment & Plan Note (Signed)
CVR- asymptomatic 

## 2018-04-20 ENCOUNTER — Encounter: Payer: Self-pay | Admitting: *Deleted

## 2018-04-20 DIAGNOSIS — R918 Other nonspecific abnormal finding of lung field: Secondary | ICD-10-CM

## 2018-04-21 ENCOUNTER — Encounter: Payer: Self-pay | Admitting: Hematology

## 2018-04-21 ENCOUNTER — Telehealth: Payer: Self-pay | Admitting: Hematology

## 2018-04-21 NOTE — Telephone Encounter (Signed)
New patient referral received from Dr. Servando Snare for dx of MALT lymphoma. Spoke to the pt's wife and scheduled the pt to see Dr. Irene Limbo on 6/26 at 3pm. Aware the pt should arrive 30 minutes early. Letter mailed.

## 2018-04-22 ENCOUNTER — Other Ambulatory Visit: Payer: Self-pay | Admitting: *Deleted

## 2018-04-23 ENCOUNTER — Ambulatory Visit (INDEPENDENT_AMBULATORY_CARE_PROVIDER_SITE_OTHER): Payer: Self-pay

## 2018-04-23 DIAGNOSIS — Z4802 Encounter for removal of sutures: Secondary | ICD-10-CM

## 2018-04-23 DIAGNOSIS — G8918 Other acute postprocedural pain: Secondary | ICD-10-CM

## 2018-04-23 MED ORDER — TRAMADOL HCL 50 MG PO TABS: 50.0000 mg | ORAL_TABLET | Freq: Four times a day (QID) | ORAL | 0 refills | Status: DC | PRN

## 2018-04-23 NOTE — Progress Notes (Signed)
Removed 1 suture from chest tube site with no signs of infection and patient tolerated well. He did request refill on tramadol. RX was called to [facility].

## 2018-04-28 ENCOUNTER — Telehealth: Payer: Self-pay | Admitting: Hematology

## 2018-04-28 ENCOUNTER — Inpatient Hospital Stay: Payer: Medicare Other | Attending: Hematology | Admitting: Hematology

## 2018-04-28 VITALS — BP 118/62 | HR 82 | Temp 98.7°F | Resp 18 | Ht 70.0 in | Wt 187.3 lb

## 2018-04-28 DIAGNOSIS — N289 Disorder of kidney and ureter, unspecified: Secondary | ICD-10-CM | POA: Insufficient documentation

## 2018-04-28 DIAGNOSIS — Z79899 Other long term (current) drug therapy: Secondary | ICD-10-CM | POA: Diagnosis not present

## 2018-04-28 DIAGNOSIS — Z87891 Personal history of nicotine dependence: Secondary | ICD-10-CM | POA: Diagnosis not present

## 2018-04-28 DIAGNOSIS — C884 Extranodal marginal zone B-cell lymphoma of mucosa-associated lymphoid tissue [MALT-lymphoma]: Secondary | ICD-10-CM | POA: Diagnosis not present

## 2018-04-28 DIAGNOSIS — Z7901 Long term (current) use of anticoagulants: Secondary | ICD-10-CM | POA: Insufficient documentation

## 2018-04-28 DIAGNOSIS — I4891 Unspecified atrial fibrillation: Secondary | ICD-10-CM | POA: Diagnosis not present

## 2018-04-28 DIAGNOSIS — C8582 Other specified types of non-Hodgkin lymphoma, intrathoracic lymph nodes: Secondary | ICD-10-CM

## 2018-04-28 NOTE — Telephone Encounter (Signed)
Scheduled appt per 6/26 los - gave patient aVS and calender per los.

## 2018-04-28 NOTE — Progress Notes (Signed)
HEMATOLOGY/ONCOLOGY CONSULTATION NOTE  Date of Service: 04/28/2018  Patient Care Team: Antony Contras, MD as PCP - General (Family Medicine)  CHIEF COMPLAINTS/PURPOSE OF CONSULTATION:  Extranodal Marginal Zone MALT Lymphoma  HISTORY OF PRESENTING ILLNESS:   James Sweeney is a wonderful 77 y.o. male who has been referred to [name] by Dr Antony Contras for evaluation and management of Extranodal Marginal Zone MALT Lymphoma. He is accompanied today by his wife. The pt reports that he is doing well overall.   The pt notes that about 4-5 years ago his urologist first discovered a small mass in his lung. He had a CT  in June 2016 which showed this mass. A navigation bronchoscopy EBUS was performed in March 2017 that did not reveal a definitive pathology. A repeat bx (VATS  With wedge resection of  RML) biopsy was completed on 04/12/18 which revealed an Extranodal marginal zone lymphoma. He denies any constitutional symptoms. The pt reports that he still has some soreness at his surgical site but denies any difficulty breathing.   He is taking Xarelto for Afib.   He last had pneumonia last summer 2018, and has had pneumonia twice. He maintains regular follow up with his PCP Dr Antony Contras.   Most recent lab results (04/12/18) of CBC  is as follows: all values are WNL except for RBC at 3.44, HGB at 11.8, HCT at 35.3, MCV at 102.6, MCH at 34.3, PLT at 126k.   On review of systems, pt reports good energy levels, chest soreness at surgical site, improving breathing, constipation, and denies fevers, chills, night sweats, unexpected weight loss, abdominal pains, leg swelling, and any other symptoms.   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 He notes having breathed in methanol/ethanol exhaust fumes in his work at times.    MEDICAL HISTORY:  Past Medical History:  Diagnosis Date  . Cervical spondylosis without myelopathy 12/01/2013  . Diabetes mellitus    borderline  .  Enlarged prostate   . Frequency of urination   . Hearing deficit   . Heart murmur   . History of kidney stones    removed with Lithotripsy  . HOH (hard of hearing)    right ear  . Hyperlipemia   . Hypertension   . Lumbago 12/01/2013  . Pneumonia 2016  . Polio    right leg smaller than left  . Shortness of breath dyspnea    with exertion    SURGICAL HISTORY: Past Surgical History:  Procedure Laterality Date  . CATARACT EXTRACTION Bilateral 2014  . VIDEO ASSISTED THORACOSCOPY (VATS)/WEDGE RESECTION Right 04/12/2018   Procedure: VIDEO ASSISTED THORACOSCOPY (VATS), WEDGE RESECTION RIGHT MIDDLE LOBE, PLACEMENT OF ON Q CATHETER;  Surgeon: [name], MD;  Location: [place];  Service: Thoracic;  Laterality: Right;  [name] VIDEO BRONCHOSCOPY N/A 04/12/2018   Procedure: VIDEO BRONCHOSCOPY;  Surgeon: [name], MD;  Location: [place];  Service: Thoracic;  Laterality: N/A;  . VIDEO BRONCHOSCOPY WITH ENDOBRONCHIAL NAVIGATION N/A 01/25/2016   Procedure: VIDEO BRONCHOSCOPY WITH ENDOBRONCHIAL NAVIGATION;  Surgeon: [name], MD;  Location: [place];  Service: Thoracic;  Laterality: N/A;  . VIDEO BRONCHOSCOPY WITH ENDOBRONCHIAL ULTRASOUND N/A 01/25/2016   Procedure: VIDEO BRONCHOSCOPY WITH ENDOBRONCHIAL ULTRASOUND;  Surgeon: [name], MD;  Location: [place];  Service: Thoracic;  Laterality: N/A;    SOCIAL HISTORY: Social History   Socioeconomic History  . Marital status: Married    Spouse name: Not on  file  . Number of children: 2  . Years of education: Not on file  . Highest education level: Not on file  Occupational History  . Not on file  Social Needs  . Financial resource strain: Not on file  . Food insecurity:    Worry: Not on file    Inability: Not on file  . Transportation needs:    Medical: Not on file    Non-medical: Not on file  Tobacco Use  . Smoking status: Former Smoker    Packs/day: 0.50    Years: 9.00    Pack years: 4.50    Types: Cigarettes     Last attempt to quit: 12/02/1967    Years since quitting: 50.4  . Smokeless tobacco: Former Systems developer    Types: Chew    Quit date: 2000  Substance and Sexual Activity  . Alcohol use: No  . Drug use: No  . Sexual activity: Never  Lifestyle  . Physical activity:    Days per week: Not on file    Minutes per session: Not on file  . Stress: Not on file  Relationships  . Social connections:    Talks on phone: Not on file    Gets together: Not on file    Attends religious service: Not on file    Active member of club or organization: Not on file    Attends meetings of clubs or organizations: Not on file    Relationship status: Not on file  . Intimate partner violence:    Fear of current or ex partner: Not on file    Emotionally abused: Not on file    Physically abused: Not on file    Forced sexual activity: Not on file  Other Topics Concern  . Not on file  Social History Narrative   Patient is right handed.   Patient drinks 1 cup caffeine daily.      Pt has been married for 8 years and has 2 children, 4 grandchildren, and 3 great-grandchildren. Lives with everyone except great-grandchildren. He does not clean house, but can shop, do yard work, and drive. 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         Epworth Sleepiness Scale Score:  8      --I have HTN   --I seem to be losing my sex drive   --I wake up to urinate frequently at night   --I awake feeling not rested   --I have borderline diabetes    FAMILY HISTORY: Family History  Problem Relation Age of Onset  . Cancer Mother        Stomach  . Cancer - Lung Father   . Heart attack Brother   . Diabetes Brother     ALLERGIES:  is allergic to levaquin [levofloxacin in d5w] and penicillins.  MEDICATIONS:  Current Outpatient Medications  Medication Sig Dispense Refill  . alfuzosin (UROXATRAL) 10 MG 24 hr tablet Take 10 mg by mouth at bedtime.     [name] amLODipine (NORVASC) 10 MG tablet Take 10 mg by mouth  daily.    . Ascorbic Acid (SUPER C COMPLEX PO) Take 1 capsule by mouth every evening. Super C 1000    . Calcium Carb-Cholecalciferol (CALCIUM 600+D3 PO) Take 1 tablet by mouth every evening.    . Cyanocobalamin (VITAMIN B-12) 5000 MCG SUBL Place 1 tablet under the tongue every evening.     . Ferrous Sulfate (IRON) 28 MG TABS Take 28 mg by mouth  every evening.    . finasteride (PROSCAR) 5 MG tablet Take 5 mg by mouth at bedtime.     [name] losartan-hydrochlorothiazide (HYZAAR) 100-25 MG tablet Take 1 tablet by mouth daily.    . Omega-3 Fatty Acids (FISH OIL) 1200 MG CAPS Take 1,200 mg by mouth every evening.    Vladimir Faster Glycol-Propyl Glycol (SYSTANE) 0.4-0.3 % SOLN Place 1 drop into both eyes 2 (two) times daily as needed (for dry/irritated eyes.).    [name] rivaroxaban (XARELTO) 20 MG TABS tablet Take 1 tablet (20 mg total) by mouth daily with supper. 30 tablet 30  . simvastatin (ZOCOR) 20 MG tablet Take 20 mg by mouth every evening.    . traMADol (ULTRAM) 50 MG tablet Take 1 tablet (50 mg total) by mouth every 6 (six) hours as needed (pain). 28 tablet 0  . Vitamin Mixture (SELENIUM-VITAMIN E) 50-400 MCG-UNIT CAPS Take 1 capsule by mouth every evening.     No current facility-administered medications for this visit.     REVIEW OF SYSTEMS:    10 Point review of Systems was done is negative except as noted above.  PHYSICAL EXAMINATION: ECOG PERFORMANCE STATUS: 1 - Symptomatic but completely ambulatory  . Vitals:   04/28/18 1511  BP: 118/62  Pulse: 82  Resp: 18  Temp: 98.7 F (37.1 C)  SpO2: 100%   Filed Weights   04/28/18 1511  Weight: 187 lb 4.8 oz (85 kg)   .Body mass index is 26.87 kg/m.  GENERAL:alert, in no acute distress and comfortable SKIN: no acute rashes, no significant lesions EYES: conjunctiva are pink and non-injected, sclera anicteric OROPHARYNX: MMM, no exudates, no oropharyngeal erythema or ulceration NECK: supple, no JVD LYMPH:  no palpable lymphadenopathy in the  cervical, axillary or inguinal regions LUNGS: clear to auscultation b/l with normal respiratory effort, healed surgical incision of thoracotomy HEART: regular rate & rhythm ABDOMEN:  normoactive bowel sounds , non tender, not distended. No palpable hepatosplenomegaly.  Extremity: no pedal edema PSYCH: alert & oriented x 3 with fluent speech NEURO: no focal motor/sensory deficits  LABORATORY DATA:  I have reviewed the data as listed  . CBC Latest Ref Rng & Units 04/14/2018 04/13/2018 04/08/2018  WBC 4.0 - 10.5 K/uL 7.7 9.2 3.7(L)  Hemoglobin 13.0 - 17.0 g/dL 11.8(L) 12.1(L) 12.6(L)  Hematocrit 39.0 - 52.0 % 35.3(L) 35.2(L) 37.4(L)  Platelets 150 - 400 K/uL 126(L) 131(L) 134(L)    . CMP Latest Ref Rng & Units 04/19/2018 04/14/2018 04/13/2018  Glucose 65 - 99 mg/dL 126(H) 154(H) 163(H)  BUN 8 - 27 mg/dL 20 20 23(H)  Creatinine 0.76 - 1.27 mg/dL 1.19 1.27(H) 1.45(H)  Sodium 134 - 144 mmol/L 137 133(L) 136  Potassium 3.5 - 5.2 mmol/L 4.2 5.9(H) 3.9  Chloride 96 - 106 mmol/L 99 98(L) 105  CO2 20 - 29 mmol/L 25 29 26   Calcium 8.6 - 10.2 mg/dL 8.9 8.0(L) 8.2(L)  Total Protein 6.5 - 8.1 g/dL - 5.0(L) -  Total Bilirubin 0.3 - 1.2 mg/dL - 1.9(H) -  Alkaline Phos 38 - 126 U/L - 45 -  AST 15 - 41 U/L - 47(H) -  ALT 17 - 63 U/L - 23 -   .No results found for: LDH  04/12/18 Flow Cytometry:   04/12/18 Lung biopsy:     RADIOGRAPHIC STUDIES: I have personally reviewed the radiological images as listed and agreed with the findings in the report. Dg Chest 2 View  Result Date: 04/16/2018 CLINICAL DATA:  Recent videoscopic bronchoscopy with  VATS procedure EXAM: CHEST - 2 VIEW COMPARISON:  April 15, 2018 FINDINGS: There is a small pleural effusion on each side, slightly larger on the right than on the left. There is a persistent minimal pneumothorax on the right with subcutaneous air, stable. No tension component. There is patchy airspace opacity in the right lower lobe, in large part due to  atelectasis but possibly with patchy superimposed pneumonia. There is atelectatic change in the left base. Heart is upper normal in size with pulmonary vascularity normal. There is aortic atherosclerosis. No adenopathy. No bone lesions. IMPRESSION: Persistent minimal right apical pneumothorax without tension component. Small pleural effusions bilaterally with patchy opacity in the right lower lobe, likely due to atelectasis but possibly with patchy superimposed pneumonia. Mild left base atelectasis. Upper lung zones are clear. Subcutaneous air remains on the right. Heart size normal.  There is aortic atherosclerosis. Aortic Atherosclerosis (ICD10-I70.0). Electronically Signed   By: Lowella Grip III M.D.   On: 04/16/2018 08:19   Dg Chest 2 View  Result Date: 04/12/2018 CLINICAL DATA:  Preop right lung mass.  Surgery today. EXAM: CHEST - 2 VIEW COMPARISON:  Chest CT 02/18/2018 FINDINGS: Pulmonary nodule in the right middle lobe measures approximately 2.7 cm. Some of the additional smaller nodules in the right lung as well as peripheral right upper lobe opacity on prior CT are not well seen radiographically. The heart is normal in size. No pleural effusion or pneumothorax. No acute osseous abnormality. IMPRESSION: Right middle lobe pulmonary nodule measures approximately 2.4 cm. Smaller nodules on prior chest CT are not well demonstrated radiographically. Electronically Signed   By: Jeb Levering M.D.   On: 04/12/2018 07:07   Dg Chest Port 1 View  Result Date: 04/15/2018 CLINICAL DATA:  Chest tube EXAM: PORTABLE CHEST 1 VIEW COMPARISON:  04/14/2018 FINDINGS: Right chest tube and central line remain in place, unchanged. Tiny right apical pneumothorax. Small amount subcutaneous air in the right chest wall. Bibasilar airspace opacities likely reflect atelectasis. Small bilateral effusions. IMPRESSION: Small residual right apical pneumothorax, less than 5%. Bibasilar atelectasis and small effusions.  Electronically Signed   By: Rolm Baptise M.D.   On: 04/15/2018 07:54   Dg Chest Port 1 View  Result Date: 04/14/2018 CLINICAL DATA:  Chest tube on the right EXAM: PORTABLE CHEST 1 VIEW COMPARISON:  04/13/2018 FINDINGS: Right central line and right chest tube remain in place, unchanged. No visible pneumothorax. Mild cardiomegaly. Vascular congestion and bibasilar airspace opacities with small effusions. Low lung volumes. IMPRESSION: Low lung volumes with cardiomegaly, vascular congestion and bibasilar opacities, worsening since prior study, likely atelectasis. Small effusions. No pneumothorax. Electronically Signed   By: Rolm Baptise M.D.   On: 04/14/2018 08:55   Dg Chest Port 1 View  Result Date: 04/13/2018 CLINICAL DATA:  Recent VATS procedure with chest tube in place EXAM: PORTABLE CHEST 1 VIEW COMPARISON:  April 12, 2018 FINDINGS: There is new airspace consolidation in the right base. There is atelectatic change in the left base with small left pleural effusion. Central catheter tip is at the cavoatrial junction. There is a chest tube on the right with minimal pneumothorax on the right. No tension component. Heart size normal. Pulmonary vascularity normal. There is aortic atherosclerosis. No adenopathy. No bone lesions. IMPRESSION: Tube and catheter positions as described. Minimal pneumothorax on the right with chest tube on the right in place. A small amount of subcutaneous air noted on the right, slightly less than 1 day prior. New consolidation right base concerning  for early pneumonia. Stable left base atelectasis with small left pleural effusion. Stable cardiac silhouette. There is aortic atherosclerosis. Aortic Atherosclerosis (ICD10-I70.0). Electronically Signed   By: Lowella Grip III M.D.   On: 04/13/2018 09:03   Dg Chest Port 1 View  Result Date: 04/12/2018 CLINICAL DATA:  Status post right VATS for resection of the right middle lobe due to pulmonary nodules. EXAM: PORTABLE CHEST 1 VIEW  COMPARISON:  PA and lateral chest 04/12/2018.  CT chest 02/18/2018. FINDINGS: Right IJ catheter is in place with the tip projecting in the mid to lower superior vena cava. Right chest tube is also identified with its tip in the lung apex. There is some subcutaneous emphysema along the right chest wall. Mild atelectasis is seen in the left lung base and right mid lung zone. No pneumothorax or pleural effusion. Heart size is upper normal. Atherosclerosis noted. IMPRESSION: Status post VATS for right middle lobe wedge resection. Support tubes and lines project in good position. No pneumothorax or acute disease. Electronically Signed   By: Inge Rise M.D.   On: 04/12/2018 11:13    ASSESSMENT & PLAN:  77 y.o. male with  1. 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  If this is an isolated lesion this would make in Stage IE. PLAN -Discussed patient's most recent labs from 04/12/18, HGB at 11.8, PLT at 126k -Discussed that the 04/12/18 bx/resection of the right middle lobe revealed an extranodal marginal zone lymphoma -Would like to collect a PET/CT in 3 months, after inflammation has completely alleviated to complete diagnostic evaluation and staging of the lymphoma. -Discussed diagnosis, natural history, prognosis, treatment criteria and rationale -if there is no rsidual lymphoma after resection of the RML pulmonary mass then would need to monitor without any additional treatment. -if the patient has significant residual disease will need to determine need for immediate treatment vs monitoring. -Advised that pt not lift anything heavy until he is cleared by Dr Servando Snare -Recommend getting flu shots and pneumonia vaccines with PCP Dr Antony Contras -Will see pt back in 3 months with labs   2.  Patient Active Problem List   Diagnosis Date Noted  . Renal insufficiency 04/19/2018  . Chronic anticoagulation 04/19/2018  . Lung mass 04/12/2018  . Atrial fibrillation ([place]) 01/19/2018  .  Pneumothorax 01/25/2016  . Pneumothorax after biopsy 01/25/2016  . Coronary artery calcification seen on CT scan 01/21/2016  . Murmur, cardiac 01/21/2016  . Hyperlipidemia 01/21/2016  . Essential hypertension 01/09/2016  . BPH (benign prostatic hyperplasia) 01/09/2016  . Solitary pulmonary nodule 01/09/2016  . Thrombocytopenia ([place]) 01/09/2016  . Cervical spondylosis without myelopathy 12/01/2013  . Lumbago 12/01/2013  . Diabetes mellitus    -continue f/u with PCP PET/CT in 12 weeks RTC with Dr Irene Limbo with labs in 3 months   All of the patients questions were answered with apparent satisfaction. The patient knows to call the clinic with any problems, questions or concerns.  The toal time spent in the appt was 60 minutes and more than 50% was on counseling and direct patient cares.    Sullivan Lone MD MS AAHIVMS [facility] Hematology/Oncology Physician [facility]  (Office):       [phone] (Work cell):  [phone] (Fax):           [phone]  04/28/2018 4:10 PM  I, Baldwin [place], am acting as a Education administrator for Dr Irene Limbo.   .I have reviewed the above documentation for accuracy and completeness, and I agree with  the above. Brunetta Genera MD

## 2018-04-30 ENCOUNTER — Other Ambulatory Visit: Payer: Self-pay | Admitting: Cardiothoracic Surgery

## 2018-04-30 DIAGNOSIS — R918 Other nonspecific abnormal finding of lung field: Secondary | ICD-10-CM

## 2018-05-03 ENCOUNTER — Ambulatory Visit (INDEPENDENT_AMBULATORY_CARE_PROVIDER_SITE_OTHER): Payer: Self-pay | Admitting: Physician Assistant

## 2018-05-03 ENCOUNTER — Ambulatory Visit
Admission: RE | Admit: 2018-05-03 | Discharge: 2018-05-03 | Disposition: A | Discharge status: Home / Self Care | Payer: Medicare Other | Source: Ambulatory Visit | Attending: Cardiothoracic Surgery | Admitting: Cardiothoracic Surgery

## 2018-05-03 ENCOUNTER — Other Ambulatory Visit: Payer: Self-pay

## 2018-05-03 VITALS — BP 109/67 | HR 72 | Resp 18 | Ht 70.0 in | Wt 187.5 lb

## 2018-05-03 DIAGNOSIS — R911 Solitary pulmonary nodule: Secondary | ICD-10-CM

## 2018-05-03 DIAGNOSIS — R918 Other nonspecific abnormal finding of lung field: Secondary | ICD-10-CM

## 2018-05-03 NOTE — Patient Instructions (Signed)
Make every effort to stay physically active, get some type of exercise on a regular basis, and stick to a "heart healthy diet".  The long term benefits for regular exercise and a healthy diet are critically important to your overall health and wellbeing.  You do have some expected postoperative atelectasis on chest x-ray today.  Please continue to use her incentive spirometer regularly.  Continue to walk several times a day.  Please follow-up with your cardiologist and your primary care provider.  You may follow-up with our service on an as-needed basis.  You are always welcome to call our office if questions or changes to your medical condition occur

## 2018-05-03 NOTE — Progress Notes (Signed)
[address]       Sweeney,James Sweeney             [phone]      James Sweeney is a 77 y.o. male patient status post video bronchoscopy, right VATS with wedge resection of the right middle lobe with Dr. Servando Snare on 04/12/2018.  He returns today for his routine 2-week follow-up visit.   1. Solitary pulmonary nodule    Past Medical History:  Diagnosis Date  . Cervical spondylosis without myelopathy 12/01/2013  . Diabetes mellitus    borderline  . Enlarged prostate   . Frequency of urination   . Hearing deficit   . Heart murmur   . History of kidney stones    removed with Lithotripsy  . HOH (hard of hearing)    right ear  . Hyperlipemia   . Hypertension   . Lumbago 12/01/2013  . Pneumonia 2016  . Polio    right leg smaller than left  . Shortness of breath dyspnea    with exertion   No past surgical history pertinent negatives on file. Scheduled Meds: Current Outpatient Medications on File Prior to Visit  Medication Sig Dispense Refill  . alfuzosin (UROXATRAL) 10 MG 24 hr tablet Take 10 mg by mouth at bedtime.     [name] amLODipine (NORVASC) 10 MG tablet Take 10 mg by mouth daily.    . Ascorbic Acid (SUPER C COMPLEX PO) Take 1 capsule by mouth every evening. Super C 1000    . Calcium Carb-Cholecalciferol (CALCIUM 600+D3 PO) Take 1 tablet by mouth every evening.    . Cyanocobalamin (VITAMIN B-12) 5000 MCG SUBL Place 1 tablet under the tongue every evening.     . Ferrous Sulfate (IRON) 28 MG TABS Take 28 mg by mouth every evening.    . finasteride (PROSCAR) 5 MG tablet Take 5 mg by mouth at bedtime.     [name] losartan-hydrochlorothiazide (HYZAAR) 100-25 MG tablet Take 1 tablet by mouth daily.    . Omega-3 Fatty Acids (FISH OIL) 1200 MG CAPS Take 1,200 mg by mouth every evening.    Vladimir Faster Glycol-Propyl Glycol (SYSTANE) 0.4-0.3 % SOLN Place 1 drop into both eyes 2 (two) times daily as needed (for dry/irritated eyes.).    [name] rivaroxaban (XARELTO) 20 MG TABS  tablet Take 1 tablet (20 mg total) by mouth daily with supper. 30 tablet 30  . simvastatin (ZOCOR) 20 MG tablet Take 20 mg by mouth every evening.    . traMADol (ULTRAM) 50 MG tablet Take 1 tablet (50 mg total) by mouth every 6 (six) hours as needed (pain). 28 tablet 0  . Vitamin Mixture (SELENIUM-VITAMIN E) 50-400 MCG-UNIT CAPS Take 1 capsule by mouth every evening.     No current facility-administered medications on file prior to visit.     Allergies  Allergen Reactions  . Levaquin [Levofloxacin In D5w] Nausea Only  . Penicillins Hives and Other (See Comments)    Has patient had a PCN reaction causing immediate rash, facial/tongue/throat swelling, SOB or lightheadedness with hypotension: unknown, childhood allergy Has patient had a PCN reaction causing severe rash involving mucus membranes or skin necrosis: No Has patient had a PCN reaction that required hospitalization No Has patient had a PCN reaction occurring within the last 10 years: No If all of the above answers are "NO", then may proceed with Cephalosporin use.    Blood pressure 109/67, pulse 72, resp. rate 18, height 5\' 10"  (1.778  m), weight 85 kg (187 lb 8 oz), SpO2 98 %.  Subjective   James Sweeney is status post video bronchoscopy, right VATS with wedge resection of the right middle lobe with Dr. Servando Snare on 04/12/2018.  He returns the office today for his two-week routine follow-up visit.  He has very little complaints however, he is still having incisional pain.  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  He also has been driving which we had not cleared him to do.  He is still requiring Ultram in the morning and in the evenings and I suggested not driving while taking this medication.  Objective  Cor: irregularly irregular Pulm: CTA bilaterally in all fields Abd: soft, nontender Wound: clean and dry without draiange Ext: no  edema  CLINICAL DATA:  Chest pain status post video-assisted thoracic surgery.  EXAM: CHEST - 2 VIEW  COMPARISON:  Radiographs of April 16, 2018.  FINDINGS: Stable cardiomediastinal silhouette. Atherosclerosis of thoracic aorta is noted. No pneumothorax is noted. No significant pleural effusion is noted. Left lung is clear. Stable right basilar opacity is noted concerning for subsegmental atelectasis or possibly inflammation. Bony thorax is unremarkable.  IMPRESSION: Stable right basilar opacity is noted concerning for subsegmental atelectasis or possibly inflammation.  Aortic Atherosclerosis (ICD10-I70.0).   Electronically Signed   By: Marijo Conception, M.D.   On: 05/03/2018 14:02   Assessment & Plan  Chest x-ray was reviewed with patient today.  All questions were answered to his satisfaction.  His incisions were clean and dry without any evidence of infection.  He has had some incisional pain however, he has been very active since surgery.  I suggested backing off of the activity level which will probably help with his pain.  I also suggested not driving while taking Ultram due to its effects.  He says he mainly takes the Ultram now for his back.  He has had occasional shortness of breath and I encouraged him to continue using his spirometer.  He seemed to be in rate controlled atrial fibrillation today and he sees Dr. Sallyanne Kuster in a few months.  He was started on Xarelto which he is still taking.  We did not make any medication changes today.  I also suggested following up with his primary care.  He may follow-up with [name] on an as-needed basis.  He is always welcome to call our office if he has questions and concerns.    Elgie Collard 05/03/2018

## 2018-07-21 ENCOUNTER — Encounter (HOSPITAL_COMMUNITY)
Admission: RE | Admit: 2018-07-21 | Discharge: 2018-07-21 | Disposition: A | Discharge status: Home / Self Care | Payer: Medicare Other | Source: Ambulatory Visit | Attending: Hematology | Admitting: Hematology

## 2018-07-21 DIAGNOSIS — C8582 Other specified types of non-Hodgkin lymphoma, intrathoracic lymph nodes: Secondary | ICD-10-CM | POA: Insufficient documentation

## 2018-07-21 LAB — GLUCOSE, CAPILLARY: Glucose-Capillary: 113 mg/dL — ABNORMAL HIGH (ref 70–99)

## 2018-07-21 MED ORDER — FLUDEOXYGLUCOSE F - 18 (FDG) INJECTION
9.4000 | Freq: Once | INTRAVENOUS | Status: AC | PRN
  Administered 2018-07-21: 9.4 via INTRAVENOUS

## 2018-07-29 ENCOUNTER — Encounter: Payer: Self-pay | Admitting: Cardiovascular Disease

## 2018-07-29 ENCOUNTER — Ambulatory Visit: Payer: Medicare Other | Admitting: Cardiovascular Disease

## 2018-07-29 VITALS — BP 130/59 | HR 90 | Ht 70.0 in | Wt 193.0 lb

## 2018-07-29 DIAGNOSIS — I10 Essential (primary) hypertension: Secondary | ICD-10-CM | POA: Diagnosis not present

## 2018-07-29 DIAGNOSIS — C884 Extranodal marginal zone b-cell lymphoma of mucosa-associated lymphoid tissue (malt-lymphoma) not having achieved remission: Secondary | ICD-10-CM

## 2018-07-29 DIAGNOSIS — I4819 Other persistent atrial fibrillation: Secondary | ICD-10-CM

## 2018-07-29 DIAGNOSIS — E7800 Pure hypercholesterolemia, unspecified: Secondary | ICD-10-CM

## 2018-07-29 DIAGNOSIS — I481 Persistent atrial fibrillation: Secondary | ICD-10-CM | POA: Diagnosis not present

## 2018-07-29 DIAGNOSIS — I2510 Atherosclerotic heart disease of native coronary artery without angina pectoris: Secondary | ICD-10-CM

## 2018-07-29 NOTE — Progress Notes (Signed)
Patient ID: James Sweeney, male   DOB: [birth year], 77 y.o.   MRN: [id number]     Cardiology Office Note    Date:  08/01/2018   ID:  James Sweeney, DOB [birth year], MRN [id number]  PCP:  Antony Contras, MD  Cardiologist:   Sanda Klein, MD   Chief Complaint  Patient presents with  . Atrial Fibrillation    pt denied chest pain    History of Present Illness:  James Sweeney is a 77 y.o. male with persistent atrial fibrillation, numerous coronary risk factors, but a benign 2017 workup for coronary artery calcification seen on chest CT, performed for lung mass, returning for follow-up.    Repeat biopsy of the lung mass shows that he has a MALT lymphoma, currently being treated with observation.  He remains completely oblivious to the arrhythmia.  Ventricular rate control is good.  He has a mildly dilated left atrium and mild LVH, but otherwise no evidence of structural heart disease.  He does not have a history of stroke, TIA or other embolic events.  He is tolerating anticoagulation with Xarelto without any bleeding complications.  He is compliant with statin therapy and his blood pressure is normal on his current medical regimen.  We have discussed the option for cardioversion, but he prefers conservative management.   Past Medical History:  Diagnosis Date  . Cervical spondylosis without myelopathy 12/01/2013  . Diabetes mellitus    borderline  . Enlarged prostate   . Frequency of urination   . Hearing deficit   . Heart murmur   . History of kidney stones    removed with Lithotripsy  . HOH (hard of hearing)    right ear  . Hyperlipemia   . Hypertension   . Lumbago 12/01/2013  . Pneumonia 2016  . Polio    right leg smaller than left  . Shortness of breath dyspnea    with exertion    Past Surgical History:  Procedure Laterality Date  . CATARACT EXTRACTION Bilateral 2014  . VIDEO ASSISTED THORACOSCOPY (VATS)/WEDGE RESECTION Right 04/12/2018   Procedure: VIDEO ASSISTED  THORACOSCOPY (VATS), WEDGE RESECTION RIGHT MIDDLE LOBE, PLACEMENT OF ON Q CATHETER;  Surgeon: [name], MD;  Location: [facility];  Service: Thoracic;  Laterality: Right;  [name] VIDEO BRONCHOSCOPY N/A 04/12/2018   Procedure: VIDEO BRONCHOSCOPY;  Surgeon: [name], MD;  Location: [place];  Service: Thoracic;  Laterality: N/A;  . VIDEO BRONCHOSCOPY WITH ENDOBRONCHIAL NAVIGATION N/A 01/25/2016   Procedure: VIDEO BRONCHOSCOPY WITH ENDOBRONCHIAL NAVIGATION;  Surgeon: [name], MD;  Location: [facility];  Service: Thoracic;  Laterality: N/A;  . VIDEO BRONCHOSCOPY WITH ENDOBRONCHIAL ULTRASOUND N/A 01/25/2016   Procedure: VIDEO BRONCHOSCOPY WITH ENDOBRONCHIAL ULTRASOUND;  Surgeon: [name], MD;  Location: [facility];  Service: Thoracic;  Laterality: N/A;    Outpatient Medications Prior to Visit  Medication Sig Dispense Refill  . alfuzosin (UROXATRAL) 10 MG 24 hr tablet Take 10 mg by mouth at bedtime.     [name] amLODipine (NORVASC) 10 MG tablet Take 10 mg by mouth daily.    . Ascorbic Acid (SUPER C COMPLEX PO) Take 1 capsule by mouth every evening. Super C 1000    . Calcium Carb-Cholecalciferol (CALCIUM 600+D3 PO) Take 1 tablet by mouth every evening.    . Cyanocobalamin (VITAMIN B-12) 5000 MCG SUBL Place 1 tablet under the tongue every evening.     . Ferrous Sulfate (IRON) 28 MG TABS Take 28 mg by mouth every evening.    [name]  finasteride (PROSCAR) 5 MG tablet Take 5 mg by mouth at bedtime.     [name] HYDROMET 5-1.5 MG/5ML syrup Take 5 mLs by mouth as needed. For cough    . levofloxacin (LEVAQUIN) 500 MG tablet Take 1 tablet by mouth daily. For 10 days    . losartan-hydrochlorothiazide (HYZAAR) 100-25 MG tablet Take 1 tablet by mouth daily.    . Omega-3 Fatty Acids (FISH OIL) 1200 MG CAPS Take 1,200 mg by mouth every evening.    Vladimir Faster Glycol-Propyl Glycol (SYSTANE) 0.4-0.3 % SOLN Place 1 drop into both eyes 2 (two) times daily as needed (for dry/irritated eyes.).    [name] rivaroxaban (XARELTO) 20  MG TABS tablet Take 1 tablet (20 mg total) by mouth daily with supper. 30 tablet 30  . simvastatin (ZOCOR) 20 MG tablet Take 20 mg by mouth every evening.    . traMADol (ULTRAM) 50 MG tablet Take 1 tablet (50 mg total) by mouth every 6 (six) hours as needed (pain). 28 tablet 0  . Vitamin Mixture (SELENIUM-VITAMIN E) 50-400 MCG-UNIT CAPS Take 1 capsule by mouth every evening.     No facility-administered medications prior to visit.      Allergies:   Levaquin [levofloxacin in d5w] and Penicillins   Social History   Socioeconomic History  . Marital status: Married    Spouse name: Not on file  . Number of children: 2  . Years of education: Not on file  . Highest education level: Not on file  Occupational History  . Not on file  Social Needs  . Financial resource strain: Not on file  . Food insecurity:    Worry: Not on file    Inability: Not on file  . Transportation needs:    Medical: Not on file    Non-medical: Not on file  Tobacco Use  . Smoking status: Former Smoker    Packs/day: 0.50    Years: 9.00    Pack years: 4.50    Types: Cigarettes    Last attempt to quit: 12/02/1967    Years since quitting: 50.6  . Smokeless tobacco: Former Systems developer    Types: Chew    Quit date: 2000  Substance and Sexual Activity  . Alcohol use: No  . Drug use: No  . Sexual activity: Never  Lifestyle  . Physical activity:    Days per week: Not on file    Minutes per session: Not on file  . Stress: Not on file  Relationships  . Social connections:    Talks on phone: Not on file    Gets together: Not on file    Attends religious service: Not on file    Active member of club or organization: Not on file    Attends meetings of clubs or organizations: Not on file    Relationship status: Not on file  Other Topics Concern  . Not on file  Social History Narrative   Patient is right handed.   Patient drinks 1 cup caffeine daily.      Pt has been married for 82 years and has 2 children, 4  grandchildren, and 3 great-grandchildren. Lives with everyone except great-grandchildren. He does not clean house, but can shop, do yard work, and drive. 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         Epworth Sleepiness Scale Score:  8      --I have HTN   --I seem to be losing my sex drive   --  I wake up to urinate frequently at night   --I awake feeling not rested   --I have borderline diabetes     Family History:  The patient's family history includes Cancer in his mother; Cancer - Lung in his father; Diabetes in his brother; Heart attack in his brother.   ROS:   Please see the history of present illness.    ROS All other systems reviewed and are negative.   PHYSICAL EXAM:   VS:  BP (!) 130/59   Pulse 90   Ht 5\' 10"  (1.778 m)   Wt 193 lb (87.5 kg)   BMI 27.69 kg/m     General: Alert, oriented x3, no distress, appears very comfortable, mildly overweight Head: no evidence of trauma, PERRL, EOMI, no exophtalmos or lid lag, no myxedema, no xanthelasma; normal ears, nose and oropharynx Neck: normal jugular venous pulsations and no hepatojugular reflux; brisk carotid pulses without delay and no carotid bruits Chest: clear to auscultation, no signs of consolidation by percussion or palpation, normal fremitus, symmetrical and full respiratory excursions Cardiovascular: normal position and quality of the apical impulse, irregular rhythm, normal first and second heart sounds, 1-2/6 early peaking systolic ejection murmur, no diastolic murmurs, rubs or gallops Abdomen: no tenderness or distention, no masses by palpation, no abnormal pulsatility or arterial bruits, normal bowel sounds, no hepatosplenomegaly Extremities: no clubbing, cyanosis or edema; 2+ radial, ulnar and brachial pulses bilaterally; 2+ right femoral, posterior tibial and dorsalis pedis pulses; 2+ left femoral, posterior tibial and dorsalis pedis pulses; no subclavian or femoral bruits Neurological:  grossly nonfocal, other than very hard of hearing Psych: Normal mood and affect   Wt Readings from Last 3 Encounters:  07/29/18 193 lb (87.5 kg)  05/03/18 187 lb 8 oz (85 kg)  04/28/18 187 lb 4.8 oz (85 kg)      Studies/Labs Reviewed:   EKG:  EKG is not ordered today.  The ekg ordered April 08, 2018 demonstrates atrial fibrillation controlled ventricular rate, otherwise normal.    Recent Labs: 07/30/2018: ALT 57; BUN 28; Creatinine 1.42; Hemoglobin 11.9; Platelets 131; Potassium 3.5; Sodium 136   Lipid Panel February 21st 2019 total cholesterol 130, HDL 54, LDL 69, triglycerides 38 Hemoglobin A1c 6.1%, creatinine 1.12, hemoglobin 12.7, TSH 1.90.   ASSESSMENT:    1. Persistent atrial fibrillation   2. Coronary artery calcification seen on CAT scan   3. 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 Essential hypertension   5. Hypercholesterolemia      PLAN:  In order of problems listed above:   1. AFib: It has become apparent that he has persistent atrial fibrillation, present on office visits in March, June and today.  This is spontaneously rate controlled without medications, consistent with some degree of conduction system disease.  He does not require antiarrhythmic therapy.  Cardioversion was offered but declined by the patient.  He understands the need to continue anticoagulation to prevent stroke.   CHADSVasc at least 3(age 63, HTN, +/-CAD). We will start with Xarelto 20 mg daily with the evening meal and reassess in a few months.  2. Coronary calcification: Normal Lexiscan Myoview in March 2017.  Remains asymptomatic.  Focus is on risk factors, which are well controlled. 3. 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  Plan for follow-up visit with his oncologist later this week. According to his last note, no treatment will be necessary if repeat PET/CT scan does  not show residual  disease 4. HTN: Controlled 5. HLP: On statin.  Medication Adjustments/Labs and Tests Ordered: Current medicines are reviewed at length with the patient today.  Concerns regarding medicines are outlined above.  Medication changes, Labs and Tests ordered today are listed in the Patient Instructions below. Patient Instructions  Dr Sallyanne Kuster recommends that you continue on your current medications as directed. Please refer to the Current Medication list given to you today.  Your physician recommends that you schedule a follow-up appointment in 6 months with Kerin Ransom, PA.  Dr Sallyanne Kuster recommends that you schedule a follow-up appointment in 12 months. You will receive a reminder letter in the mail two months in advance. If you don't receive a letter, please call our office to schedule the follow-up appointment.  If you need a refill on your cardiac medications before your next appointment, please call your pharmacy.      Signed, Sanda Klein, MD  08/01/2018 2:30 PM    [address] [place], [place], [place]  [address] Phone: [phone]; Fax: [phone]

## 2018-07-29 NOTE — Patient Instructions (Signed)
Dr Sallyanne Kuster recommends that you continue on your current medications as directed. Please refer to the Current Medication list given to you today.  Your physician recommends that you schedule a follow-up appointment in 6 months with Kerin Ransom, PA.  Dr Sallyanne Kuster recommends that you schedule a follow-up appointment in 12 months. You will receive a reminder letter in the mail two months in advance. If you don't receive a letter, please call our office to schedule the follow-up appointment.  If you need a refill on your cardiac medications before your next appointment, please call your pharmacy.

## 2018-07-30 ENCOUNTER — Telehealth: Payer: Self-pay

## 2018-07-30 ENCOUNTER — Inpatient Hospital Stay: Payer: Medicare Other | Attending: Hematology

## 2018-07-30 ENCOUNTER — Telehealth: Payer: Self-pay | Admitting: Hematology

## 2018-07-30 ENCOUNTER — Inpatient Hospital Stay: Payer: Medicare Other | Admitting: Hematology

## 2018-07-30 DIAGNOSIS — C8582 Other specified types of non-Hodgkin lymphoma, intrathoracic lymph nodes: Secondary | ICD-10-CM

## 2018-07-30 DIAGNOSIS — Z79899 Other long term (current) drug therapy: Secondary | ICD-10-CM | POA: Diagnosis not present

## 2018-07-30 DIAGNOSIS — N289 Disorder of kidney and ureter, unspecified: Secondary | ICD-10-CM | POA: Insufficient documentation

## 2018-07-30 DIAGNOSIS — C884 Extranodal marginal zone B-cell lymphoma of mucosa-associated lymphoid tissue [MALT-lymphoma]: Secondary | ICD-10-CM | POA: Insufficient documentation

## 2018-07-30 DIAGNOSIS — Z7901 Long term (current) use of anticoagulants: Secondary | ICD-10-CM | POA: Diagnosis not present

## 2018-07-30 DIAGNOSIS — I4891 Unspecified atrial fibrillation: Secondary | ICD-10-CM | POA: Insufficient documentation

## 2018-07-30 DIAGNOSIS — Z87891 Personal history of nicotine dependence: Secondary | ICD-10-CM | POA: Insufficient documentation

## 2018-07-30 LAB — CBC WITH DIFFERENTIAL/PLATELET
Basophils Absolute: 0 10*3/uL (ref 0.0–0.1)
Basophils Relative: 0 %
Eosinophils Absolute: 0.1 10*3/uL (ref 0.0–0.5)
Eosinophils Relative: 3 %
HCT: 34.2 % — ABNORMAL LOW (ref 38.4–49.9)
Hemoglobin: 11.9 g/dL — ABNORMAL LOW (ref 13.0–17.1)
Lymphocytes Relative: 6 %
Lymphs Abs: 0.2 10*3/uL — ABNORMAL LOW (ref 0.9–3.3)
MCH: 35.7 pg — ABNORMAL HIGH (ref 27.2–33.4)
MCHC: 34.7 g/dL (ref 32.0–36.0)
MCV: 102.8 fL — ABNORMAL HIGH (ref 79.3–98.0)
Monocytes Absolute: 0.3 10*3/uL (ref 0.1–0.9)
Monocytes Relative: 9 %
Neutro Abs: 3.1 10*3/uL (ref 1.5–6.5)
Neutrophils Relative %: 82 %
Platelets: 131 10*3/uL — ABNORMAL LOW (ref 140–400)
RBC: 3.33 MIL/uL — ABNORMAL LOW (ref 4.20–5.82)
RDW: 13.6 % (ref 11.0–14.6)
WBC: 3.8 10*3/uL — ABNORMAL LOW (ref 4.0–10.3)

## 2018-07-30 LAB — LACTATE DEHYDROGENASE: LDH: 154 U/L (ref 98–192)

## 2018-07-30 LAB — CMP (CANCER CENTER ONLY)
ALT: 57 U/L — ABNORMAL HIGH (ref 0–44)
AST: 37 U/L (ref 15–41)
Albumin: 3.4 g/dL — ABNORMAL LOW (ref 3.5–5.0)
Alkaline Phosphatase: 89 U/L (ref 38–126)
Anion gap: 8 (ref 5–15)
BUN: 28 mg/dL — ABNORMAL HIGH (ref 8–23)
CO2: 29 mmol/L (ref 22–32)
Calcium: 9.2 mg/dL (ref 8.9–10.3)
Chloride: 99 mmol/L (ref 98–111)
Creatinine: 1.42 mg/dL — ABNORMAL HIGH (ref 0.61–1.24)
GFR, Est AFR Am: 53 mL/min — ABNORMAL LOW (ref >60)
GFR, Estimated: 46 mL/min — ABNORMAL LOW (ref >60)
Glucose, Bld: 151 mg/dL — ABNORMAL HIGH (ref 70–99)
Potassium: 3.5 mmol/L (ref 3.5–5.1)
Sodium: 136 mmol/L (ref 135–145)
Total Bilirubin: 0.8 mg/dL (ref 0.3–1.2)
Total Protein: 6.5 g/dL (ref 6.5–8.1)

## 2018-07-30 NOTE — Telephone Encounter (Signed)
Called pt to notify that his lab appt on 08/20/18 has been cancelled because he already had labs drawn today. Pt to come in for 1040 appt on 08/20/18 with Dr. Irene Limbo. Pt verbalized understanding.

## 2018-07-30 NOTE — Telephone Encounter (Signed)
Patients wife presented to scheduling due to patient being sick and needing to reschedule his appts for today/  Appts r/s as requested

## 2018-07-31 LAB — HEPATITIS B CORE ANTIBODY, TOTAL: Hep B Core Total Ab: NEGATIVE

## 2018-07-31 LAB — HEPATITIS B SURFACE ANTIGEN: Hepatitis B Surface Ag: NEGATIVE

## 2018-07-31 LAB — HEPATITIS C ANTIBODY: HCV Ab: <0.1 s/co ratio (ref 0.0–0.9)

## 2018-08-01 ENCOUNTER — Encounter: Payer: Self-pay | Admitting: Cardiovascular Disease

## 2018-08-19 NOTE — Progress Notes (Signed)
HEMATOLOGY/ONCOLOGY CONSULTATION NOTE  Date of Service: 08/20/2018  Patient Care Team: Antony Contras, MD as PCP - General (Family Medicine)  CHIEF COMPLAINTS/PURPOSE OF CONSULTATION:  Extranodal Marginal Zone MALT Lymphoma  HISTORY OF PRESENTING ILLNESS:   James Sweeney is a wonderful 77 y.o. male who has been referred to [name] by Dr Antony Contras for evaluation and management of Extranodal Marginal Zone MALT Lymphoma. He is accompanied today by his wife. The pt reports that he is doing well overall.   The pt notes that about 4-5 years ago his urologist first discovered a small mass in his lung. He had a CT  in June 2016 which showed this mass. A navigation bronchoscopy EBUS was performed in March 2017 that did not reveal a definitive pathology. A repeat bx (VATS  With wedge resection of  RML) biopsy was completed on 04/12/18 which revealed an Extranodal marginal zone lymphoma. He denies any constitutional symptoms. The pt reports that he still has some soreness at his surgical site but denies any difficulty breathing.   He is taking Xarelto for Afib.   He last had pneumonia last summer 2018, and has had pneumonia twice. He maintains regular follow up with his PCP Dr Antony Contras.   Most recent lab results (04/12/18) of CBC  is as follows: all values are WNL except for RBC at 3.44, HGB at 11.8, HCT at 35.3, MCV at 102.6, MCH at 34.3, PLT at 126k.   On review of systems, pt reports good energy levels, chest soreness at surgical site, improving breathing, constipation, and denies fevers, chills, night sweats, unexpected weight loss, abdominal pains, leg swelling, and any other symptoms.   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 He notes having breathed in methanol/ethanol exhaust fumes in his work at times.   Interval History:   James Sweeney returns today for management and evaluation of his Extranodal Marginal Zone MALT lymphoma. The patient's last visit  with [name] was on 04/28/18. He is accompanied today by his wife. The pt reports that he is doing well overall.   The pt reports that there is still some residual soreness in his previous surgical site in his right chest. He describes the soreness as if he were occasionally being stuck with a pin.   The pt notes that otherwise he has not developed any new concerns. He continues to have good energy levels, and is eating well. The pt adds that he has been breathing well also.   Of note since the patient's last visit, pt has had a PET/CT completed on 07/21/18 with results revealing Interval resection of the RIGHT middle lobe pulmonary nodule. 2. Increase thickening of subpleural tissue in the RIGHT upper lobe with increased metabolic activity. In patient with lymphoma history, cannot exclude atypical thoracic lymphoma. 3. No change in small hypermetabolic mediastinal lymph nodes which are favored reactive. 4. Prostatomegaly with nodularity. 5. Stable RIGHT adrenal mass. Favor benign.  Lab results (07/30/18) of CBC w/diff, CMP, and Reticulocytes is as follows: all values are WNL except for WBC at 3.8k, RBC at 3.33, HGB at 11.9, HCT at 34.2, MCV at 102.8, MCH at 35.7, PLT at 131k, Lymphs abs at 200, Glucose at 151, BUN at 28, Creatinine at 1.42, Albumin at 3.4, ALT at 57, GFR at 46. 07/30/18 LDH was WNL at 154 07/30/18 Hep B and C negative   On review of systems, pt reports some remaining soreness at previous surgical site, and  denies fevers, chills, night sweats, concerns for infections, difficulty breathing, abdominal pains, leg swelling, and any other symptoms.   MEDICAL HISTORY:  Past Medical History:  Diagnosis Date  . Cervical spondylosis without myelopathy 12/01/2013  . Diabetes mellitus    borderline  . Enlarged prostate   . Frequency of urination   . Hearing deficit   . Heart murmur   . History of kidney stones    removed with Lithotripsy  . HOH (hard of hearing)    right ear  . Hyperlipemia     . Hypertension   . Lumbago 12/01/2013  . Pneumonia 2016  . Polio    right leg smaller than left  . Shortness of breath dyspnea    with exertion    SURGICAL HISTORY: Past Surgical History:  Procedure Laterality Date  . CATARACT EXTRACTION Bilateral 2014  . VIDEO ASSISTED THORACOSCOPY (VATS)/WEDGE RESECTION Right 04/12/2018   Procedure: VIDEO ASSISTED THORACOSCOPY (VATS), WEDGE RESECTION RIGHT MIDDLE LOBE, PLACEMENT OF ON Q CATHETER;  Surgeon: [name], MD;  Location: [facility];  Service: Thoracic;  Laterality: Right;  [name] VIDEO BRONCHOSCOPY N/A 04/12/2018   Procedure: VIDEO BRONCHOSCOPY;  Surgeon: [name], MD;  Location: [facility];  Service: Thoracic;  Laterality: N/A;  . VIDEO BRONCHOSCOPY WITH ENDOBRONCHIAL NAVIGATION N/A 01/25/2016   Procedure: VIDEO BRONCHOSCOPY WITH ENDOBRONCHIAL NAVIGATION;  Surgeon: [name], MD;  Location: [facility];  Service: Thoracic;  Laterality: N/A;  . VIDEO BRONCHOSCOPY WITH ENDOBRONCHIAL ULTRASOUND N/A 01/25/2016   Procedure: VIDEO BRONCHOSCOPY WITH ENDOBRONCHIAL ULTRASOUND;  Surgeon: [name], MD;  Location: [facility];  Service: Thoracic;  Laterality: N/A;    SOCIAL HISTORY: Social History   Socioeconomic History  . Marital status: Married    Spouse name: Not on file  . Number of children: 2  . Years of education: Not on file  . Highest education level: Not on file  Occupational History  . Not on file  Social Needs  . Financial resource strain: Not on file  . Food insecurity:    Worry: Not on file    Inability: Not on file  . Transportation needs:    Medical: Not on file    Non-medical: Not on file  Tobacco Use  . Smoking status: Former Smoker    Packs/day: 0.50    Years: 9.00    Pack years: 4.50    Types: Cigarettes    Last attempt to quit: 12/02/1967    Years since quitting: 50.7  . Smokeless tobacco: Former Systems developer    Types: Chew    Quit date: 2000  Substance and Sexual Activity  . Alcohol use: No  . Drug use: No   . Sexual activity: Never  Lifestyle  . Physical activity:    Days per week: Not on file    Minutes per session: Not on file  . Stress: Not on file  Relationships  . Social connections:    Talks on phone: Not on file    Gets together: Not on file    Attends religious service: Not on file    Active member of club or organization: Not on file    Attends meetings of clubs or organizations: Not on file    Relationship status: Not on file  . Intimate partner violence:    Fear of current or ex partner: Not on file    Emotionally abused: Not on file    Physically abused: Not on file    Forced sexual activity: Not on  file  Other Topics Concern  . Not on file  Social History Narrative   Patient is right handed.   Patient drinks 1 cup caffeine daily.      Pt has been married for 61 years and has 2 children, 4 grandchildren, and 3 great-grandchildren. Lives with everyone except great-grandchildren. He does not clean house, but can shop, do yard work, and drive. 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         Epworth Sleepiness Scale Score:  8      --I have HTN   --I seem to be losing my sex drive   --I wake up to urinate frequently at night   --I awake feeling not rested   --I have borderline diabetes    FAMILY HISTORY: Family History  Problem Relation Age of Onset  . Cancer Mother        Stomach  . Cancer - Lung Father   . Heart attack Brother   . Diabetes Brother     ALLERGIES:  is allergic to levaquin [levofloxacin in d5w] and penicillins.  MEDICATIONS:  Current Outpatient Medications  Medication Sig Dispense Refill  . alfuzosin (UROXATRAL) 10 MG 24 hr tablet Take 10 mg by mouth at bedtime.     [name] amLODipine (NORVASC) 10 MG tablet Take 10 mg by mouth daily.    . Ascorbic Acid (SUPER C COMPLEX PO) Take 1 capsule by mouth every evening. Super C 1000    . Calcium Carb-Cholecalciferol (CALCIUM 600+D3 PO) Take 1 tablet by mouth every evening.    .  Cyanocobalamin (VITAMIN B-12) 5000 MCG SUBL Place 1 tablet under the tongue every evening.     . Ferrous Sulfate (IRON) 28 MG TABS Take 28 mg by mouth every evening.    . finasteride (PROSCAR) 5 MG tablet Take 5 mg by mouth at bedtime.     [name] HYDROMET 5-1.5 MG/5ML syrup Take 5 mLs by mouth as needed. For cough    . levofloxacin (LEVAQUIN) 500 MG tablet Take 1 tablet by mouth daily. For 10 days    . losartan-hydrochlorothiazide (HYZAAR) 100-25 MG tablet Take 1 tablet by mouth daily.    . Omega-3 Fatty Acids (FISH OIL) 1200 MG CAPS Take 1,200 mg by mouth every evening.    Vladimir Faster Glycol-Propyl Glycol (SYSTANE) 0.4-0.3 % SOLN Place 1 drop into both eyes 2 (two) times daily as needed (for dry/irritated eyes.).    [name] rivaroxaban (XARELTO) 20 MG TABS tablet Take 1 tablet (20 mg total) by mouth daily with supper. 30 tablet 30  . simvastatin (ZOCOR) 20 MG tablet Take 20 mg by mouth every evening.    . traMADol (ULTRAM) 50 MG tablet Take 1 tablet (50 mg total) by mouth every 6 (six) hours as needed (pain). 28 tablet 0  . Vitamin Mixture (SELENIUM-VITAMIN E) 50-400 MCG-UNIT CAPS Take 1 capsule by mouth every evening.     No current facility-administered medications for this visit.     REVIEW OF SYSTEMS:    A 10+ POINT REVIEW OF SYSTEMS WAS OBTAINED including neurology, dermatology, psychiatry, cardiac, respiratory, lymph, extremities, GI, GU, Musculoskeletal, constitutional, breasts, reproductive, HEENT.  All pertinent positives are noted in the HPI.  All others are negative.   PHYSICAL EXAMINATION: ECOG PERFORMANCE STATUS: 1 - Symptomatic but completely ambulatory  . Vitals:   08/20/18 1123  BP: (!) 125/55  Pulse: 72  Resp: 18  Temp: (!) 97.5 F (36.4 C)  SpO2: 100%   Filed  Weights   08/20/18 1123  Weight: 195 lb 6.4 oz (88.6 kg)   .Body mass index is 28.04 kg/m.  GENERAL:alert, in no acute distress and comfortable SKIN: no acute rashes, no significant lesions EYES: conjunctiva are  pink and non-injected, sclera anicteric OROPHARYNX: MMM, no exudates, no oropharyngeal erythema or ulceration NECK: supple, no JVD LYMPH:  no palpable lymphadenopathy in the cervical, axillary or inguinal regions LUNGS: clear to auscultation b/l with normal respiratory effort HEART: regular rate & rhythm ABDOMEN:  normoactive bowel sounds , non tender, not distended. No palpable hepatosplenomegaly.  Extremity: no pedal edema PSYCH: alert & oriented x 3 with fluent speech NEURO: no focal motor/sensory deficits   LABORATORY DATA:  I have reviewed the data as listed  . CBC Latest Ref Rng & Units 07/30/2018 04/14/2018 04/13/2018  WBC 4.0 - 10.3 K/uL 3.8(L) 7.7 9.2  Hemoglobin 13.0 - 17.1 g/dL 11.9(L) 11.8(L) 12.1(L)  Hematocrit 38.4 - 49.9 % 34.2(L) 35.3(L) 35.2(L)  Platelets 140 - 400 K/uL 131(L) 126(L) 131(L)    . CMP Latest Ref Rng & Units 07/30/2018 04/19/2018 04/14/2018  Glucose 70 - 99 mg/dL 151(H) 126(H) 154(H)  BUN 8 - 23 mg/dL 28(H) 20 20  Creatinine 0.61 - 1.24 mg/dL 1.42(H) 1.19 1.27(H)  Sodium 135 - 145 mmol/L 136 137 133(L)  Potassium 3.5 - 5.1 mmol/L 3.5 4.2 5.9(H)  Chloride 98 - 111 mmol/L 99 99 98(L)  CO2 22 - 32 mmol/L 29 25 29   Calcium 8.9 - 10.3 mg/dL 9.2 8.9 8.0(L)  Total Protein 6.5 - 8.1 g/dL 6.5 - 5.0(L)  Total Bilirubin 0.3 - 1.2 mg/dL 0.8 - 1.9(H)  Alkaline Phos 38 - 126 U/L 89 - 45  AST 15 - 41 U/L 37 - 47(H)  ALT 0 - 44 U/L 57(H) - 23   . Lab Results  Component Value Date   LDH 154 07/30/2018    04/12/18 Flow Cytometry:   04/12/18 Lung biopsy:     RADIOGRAPHIC STUDIES: I have personally reviewed the radiological images as listed and agreed with the findings in the report. No results found.  ASSESSMENT & PLAN:  77 y.o. male with  1. 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  If this is an isolated lesion this would make in Stage IE.  04/12/18 bx/resection of the right middle lobe revealed an extranodal marginal zone lymphoma    PLAN:  -Advised that pt not lift anything heavy until he is cleared by Dr Servando Snare -Recommend getting flu shots and pneumonia vaccines with PCP Dr Antony Contras -Discussed pt labwork from 07/30/18; blood counts and chemistries are stable. LDH is normal at 154.  -Discussed the 07/21/18 PET/CT which revealed Interval resection of the RIGHT middle lobe pulmonary nodule. 2. Increase thickening of subpleural tissue in the RIGHT upper lobe with increased metabolic activity. In patient with lymphoma history, cannot exclude atypical thoracic lymphoma. 3. No change in small hypermetabolic mediastinal lymph nodes which are favored reactive. 4. Prostatomegaly with nodularity. 5. Stable RIGHT adrenal mass. Favor benign. -The pt shows no overt clinical or lab progression of his marginal zone lymphoma at this time.  -No indication for initiating active treatment at this time.   -Will watch pt with repeat imaging in 6 months  -Will see the pt back in 3 months with labs   2.  Patient Active Problem List   Diagnosis Date Noted  . Renal insufficiency 04/19/2018  . Chronic anticoagulation 04/19/2018  . Lung mass 04/12/2018  . Atrial fibrillation ([place]) 01/19/2018  .  Pneumothorax 01/25/2016  . Pneumothorax after biopsy 01/25/2016  . Coronary artery calcification seen on CT scan 01/21/2016  . Murmur, cardiac 01/21/2016  . Hyperlipidemia 01/21/2016  . Essential hypertension 01/09/2016  . BPH (benign prostatic hyperplasia) 01/09/2016  . Solitary pulmonary nodule 01/09/2016  . Thrombocytopenia ([place]) 01/09/2016  . Cervical spondylosis without myelopathy 12/01/2013  . Lumbago 12/01/2013  . Diabetes mellitus     RTC with Dr Irene Limbo with labs in 3 months   All of the patients questions were answered with apparent satisfaction. The patient knows to call the clinic with any problems, questions or concerns.  The total time spent in the appt was 25 minutes and more than 50% was on counseling and direct patient  cares.    Sullivan Lone MD MS AAHIVMS [facility] Hematology/Oncology Physician [facility]  (Office):       [phone] (Work cell):  [phone] (Fax):           [phone]  08/20/2018 11:48 AM  I, Baldwin [place], am acting as a scribe for Dr. Irene Limbo  .I have reviewed the above documentation for accuracy and completeness, and I agree with the above. Brunetta Genera MD

## 2018-08-20 ENCOUNTER — Other Ambulatory Visit: Payer: Medicare Other

## 2018-08-20 ENCOUNTER — Inpatient Hospital Stay: Payer: Medicare Other | Attending: Hematology | Admitting: Hematology

## 2018-08-20 VITALS — BP 125/55 | HR 72 | Temp 97.5°F | Resp 18 | Ht 70.0 in | Wt 195.4 lb

## 2018-08-20 DIAGNOSIS — Z79899 Other long term (current) drug therapy: Secondary | ICD-10-CM | POA: Insufficient documentation

## 2018-08-20 DIAGNOSIS — Z7901 Long term (current) use of anticoagulants: Secondary | ICD-10-CM | POA: Diagnosis not present

## 2018-08-20 DIAGNOSIS — I10 Essential (primary) hypertension: Secondary | ICD-10-CM | POA: Diagnosis not present

## 2018-08-20 DIAGNOSIS — C884 Extranodal marginal zone B-cell lymphoma of mucosa-associated lymphoid tissue [MALT-lymphoma]: Secondary | ICD-10-CM | POA: Insufficient documentation

## 2018-08-20 DIAGNOSIS — I4891 Unspecified atrial fibrillation: Secondary | ICD-10-CM | POA: Diagnosis not present

## 2018-08-20 DIAGNOSIS — Z87891 Personal history of nicotine dependence: Secondary | ICD-10-CM | POA: Diagnosis not present

## 2018-08-20 DIAGNOSIS — C8582 Other specified types of non-Hodgkin lymphoma, intrathoracic lymph nodes: Secondary | ICD-10-CM

## 2018-11-18 NOTE — Progress Notes (Signed)
HEMATOLOGY/ONCOLOGY CLINIC NOTE  Date of Service: 11/19/2018  Patient Care Team: Antony Contras, MD as PCP - General (Family Medicine)  CHIEF COMPLAINTS/PURPOSE OF CONSULTATION:  Extranodal Marginal Zone MALT Lymphoma  HISTORY OF PRESENTING ILLNESS:   James Sweeney is a wonderful 78 y.o. male who has been referred to [name] by Dr Antony Contras for evaluation and management of Extranodal Marginal Zone MALT Lymphoma. He is accompanied today by his wife. The pt reports that he is doing well overall.   The pt notes that about 4-5 years ago his urologist first discovered a small mass in his lung. He had a CT  in June 2016 which showed this mass. A navigation bronchoscopy EBUS was performed in March 2017 that did not reveal a definitive pathology. A repeat bx (VATS  With wedge resection of  RML) biopsy was completed on 04/12/18 which revealed an Extranodal marginal zone lymphoma. He denies any constitutional symptoms. The pt reports that he still has some soreness at his surgical site but denies any difficulty breathing.   He is taking Xarelto for Afib.   He last had pneumonia last summer 2018, and has had pneumonia twice. He maintains regular follow up with his PCP Dr Antony Contras.   Most recent lab results (04/12/18) of CBC  is as follows: all values are WNL except for RBC at 3.44, HGB at 11.8, HCT at 35.3, MCV at 102.6, MCH at 34.3, PLT at 126k.   On review of systems, pt reports good energy levels, chest soreness at surgical site, improving breathing, constipation, and denies fevers, chills, night sweats, unexpected weight loss, abdominal pains, leg swelling, and any other symptoms.   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 He notes having breathed in methanol/ethanol exhaust fumes in his work at times.   Interval History:   James Sweeney returns today for management and evaluation of his Extranodal Marginal Zone MALT lymphoma. The patient's last visit with [name]  was on 08/20/18. He is accompanied today by his wife. The pt reports that he is doing well overall.   The pt reports that he has not developed any new concerns in the interim. He notes that he continues to have some sensitivity in the area of his previous surgery. The pt notes that he notices this when mowing his 6.5 acres yard. He notes that he has been eating very well and endorses stable energy levels. The pt notes that he feels somewhat SOB when walking around, and endorses a slow, gradual increase in this over time, and attributes this to age.  The pt notes that he has recently had some hoarseness in his voice for the last 3 weeks, has not taken anything for this, and endorses some "biege phlegm," denies fevers. He notes that he was sneezing and coughing last week. The first week he had just hoarseness. He denies his symptoms improving. He has had his flu shot this year, and his last pneumonia vaccine was 2016 and 2008.   Lab results today (11/19/18) of CBC w/diff and CMP is as follows: all values are WNL except for WBC at 3.9k, RBC at 3.68, HCT at 38.4, MCV at 104.3, MCH at 35.9, PLT at 120k, Lymphs abs at 500, Glucose at 155, BUN at 27, Creatinine at 1.37, GFR at 49. 11/19/18 LDH is pending  On review of systems, pt reports eating well, concern for infection, hoarseness, congestion, coughing up phlegm, and denies fevers, chills, night sweats, back  pain, abdominal pains, leg swelling, and any other symptoms.  MEDICAL HISTORY:  Past Medical History:  Diagnosis Date  . Cervical spondylosis without myelopathy 12/01/2013  . Diabetes mellitus    borderline  . Enlarged prostate   . Frequency of urination   . Hearing deficit   . Heart murmur   . History of kidney stones    removed with Lithotripsy  . HOH (hard of hearing)    right ear  . Hyperlipemia   . Hypertension   . Lumbago 12/01/2013  . Pneumonia 2016  . Polio    right leg smaller than left  . Shortness of breath dyspnea    with  exertion    SURGICAL HISTORY: Past Surgical History:  Procedure Laterality Date  . CATARACT EXTRACTION Bilateral 2014  . VIDEO ASSISTED THORACOSCOPY (VATS)/WEDGE RESECTION Right 04/12/2018   Procedure: VIDEO ASSISTED THORACOSCOPY (VATS), WEDGE RESECTION RIGHT MIDDLE LOBE, PLACEMENT OF ON Q CATHETER;  Surgeon: [name], MD;  Location: [facility];  Service: Thoracic;  Laterality: Right;  [name] VIDEO BRONCHOSCOPY N/A 04/12/2018   Procedure: VIDEO BRONCHOSCOPY;  Surgeon: [name], MD;  Location: [facility];  Service: Thoracic;  Laterality: N/A;  . VIDEO BRONCHOSCOPY WITH ENDOBRONCHIAL NAVIGATION N/A 01/25/2016   Procedure: VIDEO BRONCHOSCOPY WITH ENDOBRONCHIAL NAVIGATION;  Surgeon: [name], MD;  Location: [facility];  Service: Thoracic;  Laterality: N/A;  . VIDEO BRONCHOSCOPY WITH ENDOBRONCHIAL ULTRASOUND N/A 01/25/2016   Procedure: VIDEO BRONCHOSCOPY WITH ENDOBRONCHIAL ULTRASOUND;  Surgeon: [name], MD;  Location: [facility];  Service: Thoracic;  Laterality: N/A;    SOCIAL HISTORY: Social History   Socioeconomic History  . Marital status: Married    Spouse name: Not on file  . Number of children: 2  . Years of education: Not on file  . Highest education level: Not on file  Occupational History  . Not on file  Social Needs  . Financial resource strain: Not on file  . Food insecurity:    Worry: Not on file    Inability: Not on file  . Transportation needs:    Medical: Not on file    Non-medical: Not on file  Tobacco Use  . Smoking status: Former Smoker    Packs/day: 0.50    Years: 9.00    Pack years: 4.50    Types: Cigarettes    Last attempt to quit: 12/02/1967    Years since quitting: 51.0  . Smokeless tobacco: Former Systems developer    Types: Chew    Quit date: 2000  Substance and Sexual Activity  . Alcohol use: No  . Drug use: No  . Sexual activity: Never  Lifestyle  . Physical activity:    Days per week: Not on file    Minutes per session: Not on file  . Stress:  Not on file  Relationships  . Social connections:    Talks on phone: Not on file    Gets together: Not on file    Attends religious service: Not on file    Active member of club or organization: Not on file    Attends meetings of clubs or organizations: Not on file    Relationship status: Not on file  . Intimate partner violence:    Fear of current or ex partner: Not on file    Emotionally abused: Not on file    Physically abused: Not on file    Forced sexual activity: Not on file  Other Topics Concern  . Not on file  Social History Narrative   Patient is right handed.   Patient drinks 1 cup caffeine daily.      Pt has been married for 18 years and has 2 children, 4 grandchildren, and 3 great-grandchildren. Lives with everyone except great-grandchildren. He does not clean house, but can shop, do yard work, and drive. 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         Epworth Sleepiness Scale Score:  8      --I have HTN   --I seem to be losing my sex drive   --I wake up to urinate frequently at night   --I awake feeling not rested   --I have borderline diabetes    FAMILY HISTORY: Family History  Problem Relation Age of Onset  . Cancer Mother        Stomach  . Cancer - Lung Father   . Heart attack Brother   . Diabetes Brother     ALLERGIES:  is allergic to levaquin [levofloxacin in d5w] and penicillins.  MEDICATIONS:  Current Outpatient Medications  Medication Sig Dispense Refill  . alfuzosin (UROXATRAL) 10 MG 24 hr tablet Take 10 mg by mouth at bedtime.     [name] amLODipine (NORVASC) 10 MG tablet Take 10 mg by mouth daily.    . Ascorbic Acid (SUPER C COMPLEX PO) Take 1 capsule by mouth every evening. Super C 1000    . Calcium Carb-Cholecalciferol (CALCIUM 600+D3 PO) Take 1 tablet by mouth every evening.    . Cyanocobalamin (VITAMIN B-12) 5000 MCG SUBL Place 1 tablet under the tongue every evening.     . Ferrous Sulfate (IRON) 28 MG TABS Take 28 mg by  mouth every evening.    . finasteride (PROSCAR) 5 MG tablet Take 5 mg by mouth at bedtime.     [name] HYDROMET 5-1.5 MG/5ML syrup Take 5 mLs by mouth as needed. For cough    . levofloxacin (LEVAQUIN) 500 MG tablet Take 1 tablet by mouth daily. For 10 days    . losartan-hydrochlorothiazide (HYZAAR) 100-25 MG tablet Take 1 tablet by mouth daily.    . Omega-3 Fatty Acids (FISH OIL) 1200 MG CAPS Take 1,200 mg by mouth every evening.    Vladimir Faster Glycol-Propyl Glycol (SYSTANE) 0.4-0.3 % SOLN Place 1 drop into both eyes 2 (two) times daily as needed (for dry/irritated eyes.).    [name] rivaroxaban (XARELTO) 20 MG TABS tablet Take 1 tablet (20 mg total) by mouth daily with supper. 30 tablet 30  . simvastatin (ZOCOR) 20 MG tablet Take 20 mg by mouth every evening.    . traMADol (ULTRAM) 50 MG tablet Take 1 tablet (50 mg total) by mouth every 6 (six) hours as needed (pain). 28 tablet 0  . Vitamin Mixture (SELENIUM-VITAMIN E) 50-400 MCG-UNIT CAPS Take 1 capsule by mouth every evening.     No current facility-administered medications for this visit.     REVIEW OF SYSTEMS:    A 10+ POINT REVIEW OF SYSTEMS WAS OBTAINED including neurology, dermatology, psychiatry, cardiac, respiratory, lymph, extremities, GI, GU, Musculoskeletal, constitutional, breasts, reproductive, HEENT.  All pertinent positives are noted in the HPI.  All others are negative.   PHYSICAL EXAMINATION: ECOG PERFORMANCE STATUS: 1 - Symptomatic but completely ambulatory  . Vitals:   11/19/18 1044  BP: (!) 136/59  Pulse: 74  Resp: 18  Temp: 97.7 F (36.5 C)  SpO2: 100%   Filed Weights   11/19/18 1044  Weight: 191 lb 14.4 oz (87  kg)   .Body mass index is 27.53 kg/m.  GENERAL:alert, in no acute distress and comfortable SKIN: no acute rashes, no significant lesions EYES: conjunctiva are pink and non-injected, sclera anicteric OROPHARYNX: MMM, no exudates, no oropharyngeal erythema or ulceration NECK: supple, no JVD LYMPH:  no  palpable lymphadenopathy in the cervical, axillary or inguinal regions LUNGS: clear to auscultation b/l with normal respiratory effort HEART: regular rate & rhythm ABDOMEN:  normoactive bowel sounds , non tender, not distended. No palpable hepatosplenomegaly.  Extremity: no pedal edema PSYCH: alert & oriented x 3 with fluent speech NEURO: no focal motor/sensory deficits    LABORATORY DATA:  I have reviewed the data as listed  . CBC Latest Ref Rng & Units 11/19/2018 07/30/2018 04/14/2018  WBC 4.0 - 10.5 K/uL 3.9(L) 3.8(L) 7.7  Hemoglobin 13.0 - 17.0 g/dL 13.2 11.9(L) 11.8(L)  Hematocrit 39.0 - 52.0 % 38.4(L) 34.2(L) 35.3(L)  Platelets 150 - 400 K/uL 120(L) 131(L) 126(L)    . CMP Latest Ref Rng & Units 11/19/2018 07/30/2018 04/19/2018  Glucose 70 - 99 mg/dL 155(H) 151(H) 126(H)  BUN 8 - 23 mg/dL 27(H) 28(H) 20  Creatinine 0.61 - 1.24 mg/dL 1.37(H) 1.42(H) 1.19  Sodium 135 - 145 mmol/L 142 136 137  Potassium 3.5 - 5.1 mmol/L 4.3 3.5 4.2  Chloride 98 - 111 mmol/L 104 99 99  CO2 22 - 32 mmol/L 30 29 25   Calcium 8.9 - 10.3 mg/dL 9.5 9.2 8.9  Total Protein 6.5 - 8.1 g/dL 6.9 6.5 -  Total Bilirubin 0.3 - 1.2 mg/dL 0.7 0.8 -  Alkaline Phos 38 - 126 U/L 84 89 -  AST 15 - 41 U/L 18 37 -  ALT 0 - 44 U/L 27 57(H) -   . Lab Results  Component Value Date   LDH 154 07/30/2018    04/12/18 Flow Cytometry:   04/12/18 Lung biopsy:     RADIOGRAPHIC STUDIES: I have personally reviewed the radiological images as listed and agreed with the findings in the report. No results found.  ASSESSMENT & PLAN:  78 y.o. male with  1. 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  If this is an isolated lesion this would make in Stage IE.  04/12/18 bx/resection of the right middle lobe revealed an extranodal marginal zone lymphoma   07/21/18 PET/CT revealed Interval resection of the RIGHT middle lobe pulmonary nodule. 2. Increase thickening of subpleural tissue in the RIGHT upper lobe with  increased metabolic activity. In patient with lymphoma history, cannot exclude atypical thoracic lymphoma. 3. No change in small hypermetabolic mediastinal lymph nodes which are favored reactive. 4. Prostatomegaly with nodularity. 5. Stable RIGHT adrenal mass. Favor benign.  2) Acute Bronchitis PLAN:  -Discussed pt labwork today, 11/19/18; HGB normalized to 13.2, other blood counts and chemistries are stable  -Will order empiric Z pack, with lower threshold to treat infection given previous lung surgery  -The pt shows no clinical or lab progression/return of Extranodal Marginal Zone Lymphoma at this time.  -No indication for further treatment at this time. -Advised that pt not lift anything heavy until he is cleared by Dr Servando Snare -Recommended again getting flu shots and pneumonia vaccines with PCP Dr Antony Contras -Will watch pt with repeat CT imaging in 4 months, prior to next visit  -Will see the pt back in 4 months   2.  Patient Active Problem List   Diagnosis Date Noted  . Renal insufficiency 04/19/2018  . Chronic anticoagulation 04/19/2018  . Lung mass 04/12/2018  .  Atrial fibrillation ([place]) 01/19/2018  . Pneumothorax 01/25/2016  . Pneumothorax after biopsy 01/25/2016  . Coronary artery calcification seen on CT scan 01/21/2016  . Murmur, cardiac 01/21/2016  . Hyperlipidemia 01/21/2016  . Essential hypertension 01/09/2016  . BPH (benign prostatic hyperplasia) 01/09/2016  . Solitary pulmonary nodule 01/09/2016  . Thrombocytopenia ([place]) 01/09/2016  . Cervical spondylosis without myelopathy 12/01/2013  . Lumbago 12/01/2013  . Diabetes mellitus      Ct chest in 16 weeks RTC with Dr Irene Limbo with labs in 4 months   All of the patients questions were answered with apparent satisfaction. The patient knows to call the clinic with any problems, questions or concerns.  The total time spent in the appt was 20 minutes and more than 50% was on counseling and direct patient cares.      Sullivan Lone MD MS AAHIVMS [facility] Hematology/Oncology Physician [facility]  (Office):       [phone] (Work cell):  [phone] (Fax):           [phone]  11/19/2018 11:28 AM  I, Baldwin Jamaica, am acting as a scribe for Dr. Sullivan Lone.   .I have reviewed the above documentation for accuracy and completeness, and I agree with the above. Brunetta Genera MD

## 2018-11-19 ENCOUNTER — Encounter: Payer: Self-pay | Admitting: Hematology

## 2018-11-19 ENCOUNTER — Inpatient Hospital Stay (HOSPITAL_BASED_OUTPATIENT_CLINIC_OR_DEPARTMENT_OTHER): Payer: Medicare Other | Admitting: Hematology

## 2018-11-19 ENCOUNTER — Telehealth: Payer: Self-pay

## 2018-11-19 ENCOUNTER — Inpatient Hospital Stay: Payer: Medicare Other | Attending: Hematology

## 2018-11-19 VITALS — BP 136/59 | HR 74 | Temp 97.7°F | Resp 18 | Ht 70.0 in | Wt 191.9 lb

## 2018-11-19 DIAGNOSIS — I10 Essential (primary) hypertension: Secondary | ICD-10-CM

## 2018-11-19 DIAGNOSIS — Z800 Family history of malignant neoplasm of digestive organs: Secondary | ICD-10-CM | POA: Insufficient documentation

## 2018-11-19 DIAGNOSIS — Z7901 Long term (current) use of anticoagulants: Secondary | ICD-10-CM

## 2018-11-19 DIAGNOSIS — C884 Extranodal marginal zone B-cell lymphoma of mucosa-associated lymphoid tissue [MALT-lymphoma]: Secondary | ICD-10-CM | POA: Insufficient documentation

## 2018-11-19 DIAGNOSIS — J029 Acute pharyngitis, unspecified: Secondary | ICD-10-CM

## 2018-11-19 DIAGNOSIS — Z801 Family history of malignant neoplasm of trachea, bronchus and lung: Secondary | ICD-10-CM | POA: Diagnosis not present

## 2018-11-19 DIAGNOSIS — C8582 Other specified types of non-Hodgkin lymphoma, intrathoracic lymph nodes: Secondary | ICD-10-CM | POA: Diagnosis not present

## 2018-11-19 DIAGNOSIS — R490 Dysphonia: Secondary | ICD-10-CM

## 2018-11-19 DIAGNOSIS — Z87891 Personal history of nicotine dependence: Secondary | ICD-10-CM | POA: Insufficient documentation

## 2018-11-19 LAB — CBC WITH DIFFERENTIAL/PLATELET
Abs Immature Granulocytes: 0.01 10*3/uL (ref 0.00–0.07)
Basophils Absolute: 0 10*3/uL (ref 0.0–0.1)
Basophils Relative: 0 %
Eosinophils Absolute: 0.1 10*3/uL (ref 0.0–0.5)
Eosinophils Relative: 2 %
HCT: 38.4 % — ABNORMAL LOW (ref 39.0–52.0)
Hemoglobin: 13.2 g/dL (ref 13.0–17.0)
Immature Granulocytes: 0 %
Lymphocytes Relative: 13 %
Lymphs Abs: 0.5 10*3/uL — ABNORMAL LOW (ref 0.7–4.0)
MCH: 35.9 pg — ABNORMAL HIGH (ref 26.0–34.0)
MCHC: 34.4 g/dL (ref 30.0–36.0)
MCV: 104.3 fL — ABNORMAL HIGH (ref 80.0–100.0)
Monocytes Absolute: 0.3 10*3/uL (ref 0.1–1.0)
Monocytes Relative: 7 %
Neutro Abs: 3 10*3/uL (ref 1.7–7.7)
Neutrophils Relative %: 78 %
Platelets: 120 10*3/uL — ABNORMAL LOW (ref 150–400)
RBC: 3.68 MIL/uL — ABNORMAL LOW (ref 4.22–5.81)
RDW: 12.5 % (ref 11.5–15.5)
WBC: 3.9 10*3/uL — ABNORMAL LOW (ref 4.0–10.5)
nRBC: 0 % (ref 0.0–0.2)

## 2018-11-19 LAB — CMP (CANCER CENTER ONLY)
ALT: 27 U/L (ref 0–44)
AST: 18 U/L (ref 15–41)
Albumin: 4.3 g/dL (ref 3.5–5.0)
Alkaline Phosphatase: 84 U/L (ref 38–126)
Anion gap: 8 (ref 5–15)
BUN: 27 mg/dL — ABNORMAL HIGH (ref 8–23)
CO2: 30 mmol/L (ref 22–32)
Calcium: 9.5 mg/dL (ref 8.9–10.3)
Chloride: 104 mmol/L (ref 98–111)
Creatinine: 1.37 mg/dL — ABNORMAL HIGH (ref 0.61–1.24)
GFR, Est AFR Am: 57 mL/min — ABNORMAL LOW (ref >60)
GFR, Estimated: 49 mL/min — ABNORMAL LOW (ref >60)
Glucose, Bld: 155 mg/dL — ABNORMAL HIGH (ref 70–99)
Potassium: 4.3 mmol/L (ref 3.5–5.1)
Sodium: 142 mmol/L (ref 135–145)
Total Bilirubin: 0.7 mg/dL (ref 0.3–1.2)
Total Protein: 6.9 g/dL (ref 6.5–8.1)

## 2018-11-19 LAB — LACTATE DEHYDROGENASE: LDH: 129 U/L (ref 98–192)

## 2018-11-19 MED ORDER — AZITHROMYCIN 250 MG PO TABS: ORAL_TABLET | ORAL | 0 refills | Status: DC

## 2018-11-19 NOTE — Telephone Encounter (Signed)
Printed avs and calender of upcoming appointment. Per 11/7 los 

## 2019-02-08 ENCOUNTER — Other Ambulatory Visit: Payer: Self-pay | Admitting: Cardiovascular Disease

## 2019-02-08 ENCOUNTER — Other Ambulatory Visit: Payer: Self-pay

## 2019-02-08 ENCOUNTER — Telehealth: Payer: Self-pay

## 2019-02-08 MED ORDER — RIVAROXABAN 20 MG PO TABS: 20.0000 mg | ORAL_TABLET | Freq: Every day | ORAL | 2 refills | Status: DC

## 2019-02-08 NOTE — Telephone Encounter (Signed)
Patient is out of medication

## 2019-02-08 NOTE — Telephone Encounter (Signed)
Refilled xarelto

## 2019-03-11 ENCOUNTER — Ambulatory Visit (HOSPITAL_COMMUNITY): Payer: Medicare Other

## 2019-03-11 ENCOUNTER — Other Ambulatory Visit: Payer: Self-pay

## 2019-03-11 ENCOUNTER — Ambulatory Visit (HOSPITAL_COMMUNITY): Admission: RE | Admit: 2019-03-11 | Payer: Medicare Other | Source: Ambulatory Visit

## 2019-03-11 ENCOUNTER — Ambulatory Visit (HOSPITAL_COMMUNITY)
Admission: RE | Admit: 2019-03-11 | Discharge: 2019-03-11 | Disposition: A | Discharge status: Home / Self Care | Payer: Medicare Other | Source: Ambulatory Visit | Attending: Hematology | Admitting: Hematology

## 2019-03-11 DIAGNOSIS — C8582 Other specified types of non-Hodgkin lymphoma, intrathoracic lymph nodes: Secondary | ICD-10-CM | POA: Diagnosis not present

## 2019-03-17 NOTE — Progress Notes (Signed)
HEMATOLOGY/ONCOLOGY CLINIC NOTE  Date of Service: 03/18/2019  Patient Care Team: Antony Contras, MD as PCP - General (Family Medicine)  CHIEF COMPLAINTS/PURPOSE OF CONSULTATION:  Extranodal Marginal Zone MALT Lymphoma  HISTORY OF PRESENTING ILLNESS:   James Sweeney is a wonderful 78 y.o. male who has been referred to [name] by Dr Antony Contras for evaluation and management of Extranodal Marginal Zone MALT Lymphoma. He is accompanied today by his wife. The pt reports that he is doing well overall.   The pt notes that about 4-5 years ago his urologist first discovered a small mass in his lung. He had a CT  in June 2016 which showed this mass. A navigation bronchoscopy EBUS was performed in March 2017 that did not reveal a definitive pathology. A repeat bx (VATS  With wedge resection of  RML) biopsy was completed on 04/12/18 which revealed an Extranodal marginal zone lymphoma. He denies any constitutional symptoms. The pt reports that he still has some soreness at his surgical site but denies any difficulty breathing.   He is taking Xarelto for Afib.   He last had pneumonia last summer 2018, and has had pneumonia twice. He maintains regular follow up with his PCP Dr Antony Contras.   Most recent lab results (04/12/18) of CBC  is as follows: all values are WNL except for RBC at 3.44, HGB at 11.8, HCT at 35.3, MCV at 102.6, MCH at 34.3, PLT at 126k.   On review of systems, pt reports good energy levels, chest soreness at surgical site, improving breathing, constipation, and denies fevers, chills, night sweats, unexpected weight loss, abdominal pains, leg swelling, and any other symptoms.   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 He notes having breathed in methanol/ethanol exhaust fumes in his work at times.   Interval History:   James Sweeney returns today for management and evaluation of his Extranodal Marginal Zone MALT lymphoma. The patient's last visit with [name]  was on 11/19/18. The pt reports that he is doing well overall.  The pt reports that he has had increased phlegm production and endorses congestion for "the last 3 months." He notes that he feels that he needs to clear his throat more frequently. He notes that his phlegm is "gray but maybe a little yellow," and denies fevers or chills. At last visit, he was prescribed Azithromycin and notes that he took this. He notes that he took 1/2 of a pill of an antibiotic for 4 days, 2 weeks ago. 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 He notes that his breathing is stable and denies changes in breathing. He notes that he walks frequently.  Of note since the patient's last visit, pt has had a CT Chest completed on 03/11/19 with results revealing "Ill-defined ground-glass and consolidative nodules in both lungs remain stable, except for mild increase in size of 1.6 cm central right perihilar nodule. Consider continued followup by chest CT in 6 months. 2. Stable mild partially-calcified mediastinal and bilateral hilar lymphadenopathy. 3. Stable 2.6 cm right adrenal mass,, likely a benign adenoma. Recommend continued attention on follow-up CT. 4. Aortic and coronary artery atherosclerosis."  Lab results today (03/18/19) of CBC w/diff and CMP is as follows: all values are WNL except for WBC at 3.2k, RBC at 3.35, HGB at 12.1, HCT at 35.4, MCV at 105.7, MCH at 36.1, PLT at 130k, Lymphs  abs at 500, Glucose at 116, Creatinine at 1.32, Calcium at 8.3, GFR at 51. 03/18/19 LDH is pending  On review of systems, pt reports staying active, good energy levels, gray/yellow phlegm production, congestion, and denies leg swelling, difficulty breathing, SOB, fevers, chills, abdominal pains, leg swelling, and any other symptoms.    MEDICAL HISTORY:  Past Medical History:  Diagnosis Date   Cervical spondylosis without myelopathy 12/01/2013   Diabetes mellitus     borderline   Enlarged prostate    Frequency of urination    Hearing deficit    Heart murmur    History of kidney stones    removed with Lithotripsy   HOH (hard of hearing)    right ear   Hyperlipemia    Hypertension    Lumbago 12/01/2013   Pneumonia 2016   Polio    right leg smaller than left   Shortness of breath dyspnea    with exertion    SURGICAL HISTORY: Past Surgical History:  Procedure Laterality Date   CATARACT EXTRACTION Bilateral 2014   VIDEO ASSISTED THORACOSCOPY (VATS)/WEDGE RESECTION Right 04/12/2018   Procedure: VIDEO ASSISTED THORACOSCOPY (VATS), WEDGE RESECTION RIGHT MIDDLE LOBE, PLACEMENT OF ON Q CATHETER;  Surgeon: [name], MD;  Location: [facility];  Service: Thoracic;  Laterality: Right;   VIDEO BRONCHOSCOPY N/A 04/12/2018   Procedure: VIDEO BRONCHOSCOPY;  Surgeon: [name], MD;  Location: [facility];  Service: Thoracic;  Laterality: N/A;   VIDEO BRONCHOSCOPY WITH ENDOBRONCHIAL NAVIGATION N/A 01/25/2016   Procedure: VIDEO BRONCHOSCOPY WITH ENDOBRONCHIAL NAVIGATION;  Surgeon: [name], MD;  Location: [facility];  Service: Thoracic;  Laterality: N/A;   VIDEO BRONCHOSCOPY WITH ENDOBRONCHIAL ULTRASOUND N/A 01/25/2016   Procedure: VIDEO BRONCHOSCOPY WITH ENDOBRONCHIAL ULTRASOUND;  Surgeon: [name], MD;  Location: [facility];  Service: Thoracic;  Laterality: N/A;    SOCIAL HISTORY: Social History   Socioeconomic History   Marital status: Married    Spouse name: Not on file   Number of children: 2   Years of education: Not on file   Highest education level: Not on file  Occupational History   Not on file  Social Needs   Financial resource strain: Not on file   Food insecurity:    Worry: Not on file    Inability: Not on file   Transportation needs:    Medical: Not on file    Non-medical: Not on file  Tobacco Use   Smoking status: Former Smoker    Packs/day: 0.50    Years: 9.00    Pack years: 4.50    Types:  Cigarettes    Last attempt to quit: 12/02/1967    Years since quitting: 51.3   Smokeless tobacco: Former Systems developer    Types: Chew    Quit date: 2000  Substance and Sexual Activity   Alcohol use: No   Drug use: No   Sexual activity: Never  Lifestyle   Physical activity:    Days per week: Not on file    Minutes per session: Not on file   Stress: Not on file  Relationships   Social connections:    Talks on phone: Not on file    Gets together: Not on file    Attends religious service: Not on file    Active member of club or organization: Not on file    Attends meetings of clubs or organizations: Not on file    Relationship status: Not on file   Intimate partner violence:  Fear of current or ex partner: Not on file    Emotionally abused: Not on file    Physically abused: Not on file    Forced sexual activity: Not on file  Other Topics Concern   Not on file  Social History Narrative   Patient is right handed.   Patient drinks 1 cup caffeine daily.      Pt has been married for 48 years and has 2 children, 4 grandchildren, and 3 great-grandchildren. Lives with everyone except great-grandchildren. He does not clean house, but can shop, do yard work, and drive. 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         Epworth Sleepiness Scale Score:  8      --I have HTN   --I seem to be losing my sex drive   --I wake up to urinate frequently at night   --I awake feeling not rested   --I have borderline diabetes    FAMILY HISTORY: Family History  Problem Relation Age of Onset   Cancer Mother        Stomach   Cancer - Lung Father    Heart attack Brother    Diabetes Brother     ALLERGIES:  is allergic to levaquin [levofloxacin in d5w] and penicillins.  MEDICATIONS:  Current Outpatient Medications  Medication Sig Dispense Refill   alfuzosin (UROXATRAL) 10 MG 24 hr tablet Take 10 mg by mouth at bedtime.      amLODipine (NORVASC) 10 MG tablet Take  10 mg by mouth daily.     Ascorbic Acid (SUPER C COMPLEX PO) Take 1 capsule by mouth every evening. Super C 1000     azithromycin (ZITHROMAX) 250 MG tablet 2 tabs (500mg ) on day 1 then 1 tab (250mg ) daily from Day 2 for 4 days 6 each 0   Calcium Carb-Cholecalciferol (CALCIUM 600+D3 PO) Take 1 tablet by mouth every evening.     Cyanocobalamin (VITAMIN B-12) 5000 MCG SUBL Place 1 tablet under the tongue every evening.      doxycycline (VIBRA-TABS) 100 MG tablet Take 1 tablet (100 mg total) by mouth 2 (two) times daily for 10 days. plz take adequate sun exposure precautions. 20 tablet 0   Ferrous Sulfate (IRON) 28 MG TABS Take 28 mg by mouth every evening.     finasteride (PROSCAR) 5 MG tablet Take 5 mg by mouth at bedtime.      HYDROMET 5-1.5 MG/5ML syrup Take 5 mLs by mouth as needed. For cough     levofloxacin (LEVAQUIN) 500 MG tablet Take 1 tablet by mouth daily. For 10 days     losartan-hydrochlorothiazide (HYZAAR) 100-25 MG tablet Take 1 tablet by mouth daily.     Omega-3 Fatty Acids (FISH OIL) 1200 MG CAPS Take 1,200 mg by mouth every evening.     Polyethyl Glycol-Propyl Glycol (SYSTANE) 0.4-0.3 % SOLN Place 1 drop into both eyes 2 (two) times daily as needed (for dry/irritated eyes.).     simvastatin (ZOCOR) 20 MG tablet Take 20 mg by mouth every evening.     traMADol (ULTRAM) 50 MG tablet Take 1 tablet (50 mg total) by mouth every 6 (six) hours as needed (pain). 28 tablet 0   Vitamin Mixture (SELENIUM-VITAMIN E) 50-400 MCG-UNIT CAPS Take 1 capsule by mouth every evening.     XARELTO 20 MG TABS tablet TAKE 1 TABLET(20 MG) BY MOUTH DAILY WITH SUPPER 30 tablet 3   No current facility-administered medications for this visit.  REVIEW OF SYSTEMS:    A 10+ POINT REVIEW OF SYSTEMS WAS OBTAINED including neurology, dermatology, psychiatry, cardiac, respiratory, lymph, extremities, GI, GU, Musculoskeletal, constitutional, breasts, reproductive, HEENT.  All pertinent positives  are noted in the HPI.  All others are negative.   PHYSICAL EXAMINATION: ECOG PERFORMANCE STATUS: 1 - Symptomatic but completely ambulatory  . Vitals:   03/18/19 1134  BP: 124/60  Pulse: 65  Resp: 17  Temp: 98.1 F (36.7 C)  SpO2: 99%   Filed Weights   03/18/19 1134  Weight: 189 lb 1.6 oz (85.8 kg)   .Body mass index is 27.13 kg/m.  GENERAL:alert, in no acute distress and comfortable SKIN: no acute rashes, no significant lesions EYES: conjunctiva are pink and non-injected, sclera anicteric OROPHARYNX: MMM, no exudates, no oropharyngeal erythema or ulceration NECK: supple, no JVD LYMPH:  no palpable lymphadenopathy in the cervical, axillary or inguinal regions LUNGS: clear to auscultation b/l with normal respiratory effort HEART: regular rate & rhythm ABDOMEN:  normoactive bowel sounds , non tender, not distended. No palpable hepatosplenomegaly.  Extremity: no pedal edema PSYCH: alert & oriented x 3 with fluent speech NEURO: no focal motor/sensory deficits   LABORATORY DATA:  I have reviewed the data as listed  . CBC Latest Ref Rng & Units 03/18/2019 11/19/2018 07/30/2018  WBC 4.0 - 10.5 K/uL 3.2(L) 3.9(L) 3.8(L)  Hemoglobin 13.0 - 17.0 g/dL 12.1(L) 13.2 11.9(L)  Hematocrit 39.0 - 52.0 % 35.4(L) 38.4(L) 34.2(L)  Platelets 150 - 400 K/uL 130(L) 120(L) 131(L)    . CMP Latest Ref Rng & Units 03/18/2019 11/19/2018 07/30/2018  Glucose 70 - 99 mg/dL 116(H) 155(H) 151(H)  BUN 8 - 23 mg/dL 20 27(H) 28(H)  Creatinine 0.61 - 1.24 mg/dL 1.32(H) 1.37(H) 1.42(H)  Sodium 135 - 145 mmol/L 142 142 136  Potassium 3.5 - 5.1 mmol/L 4.0 4.3 3.5  Chloride 98 - 111 mmol/L 106 104 99  CO2 22 - 32 mmol/L 29 30 29   Calcium 8.9 - 10.3 mg/dL 8.3(L) 9.5 9.2  Total Protein 6.5 - 8.1 g/dL 6.5 6.9 6.5  Total Bilirubin 0.3 - 1.2 mg/dL 0.6 0.7 0.8  Alkaline Phos 38 - 126 U/L 91 84 89  AST 15 - 41 U/L 22 18 37  ALT 0 - 44 U/L 31 27 57(H)   . Lab Results  Component Value Date   LDH 142  03/18/2019    04/12/18 Flow Cytometry:   04/12/18 Lung biopsy:     RADIOGRAPHIC STUDIES: I have personally reviewed the radiological images as listed and agreed with the findings in the report. Ct Chest Wo Contrast  Result Date: 03/11/2019 CLINICAL DATA:  Follow-up pulmonary extranodal marginal zone MALT lymphoma. EXAM: CT CHEST WITHOUT CONTRAST TECHNIQUE: Multidetector CT imaging of the chest was performed following the standard protocol without IV contrast. COMPARISON:  PET-CT on 07/21/2018 FINDINGS: Cardiovascular: No acute findings. Aortic and coronary artery atherosclerosis. Mediastinum/Nodes: Stable small partially calcified mediastinal and bilateral hilar lymph nodes, largest in the AP window region measuring 11 mm. No new or increased lymphadenopathy identified. Lungs/Pleura: Postsurgical changes from right middle lobe wedge resection remain stable. Ill-defined pleural-parenchymal opacity in the anterior right upper lobe remains stable since previous study. Sub-cm ill-defined ground-glass and consolidative nodules are again seen in both lungs. The majority of these are stable, except for a 1.6 cm right perihilar nodule on image 69/5, which is mildly increased from 1.2 cm on previous study. No evidence of pleural effusion. Upper Abdomen: Several small fluid attenuation hepatic cysts remain stable.  A 2.6 cm right adrenal mass has indeterminate attenuation value, but is also stable and showed no hypermetabolic activity on previous PET. Musculoskeletal:  No suspicious bone lesions. IMPRESSION: 1. Ill-defined ground-glass and consolidative nodules in both lungs remain stable, except for mild increase in size of 1.6 cm central right perihilar nodule. Consider continued followup by chest CT in 6 months. 2. Stable mild partially-calcified mediastinal and bilateral hilar lymphadenopathy. 3. Stable 2.6 cm right adrenal mass,, likely a benign adenoma. Recommend continued attention on follow-up CT. 4. Aortic  and coronary artery atherosclerosis. Electronically Signed   By: Earle Gell M.D.   On: 03/11/2019 13:56    ASSESSMENT & PLAN:  78 y.o. male with  1. 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  If this is an isolated lesion this would make in Stage IE.  04/12/18 bx/resection of the right middle lobe revealed an extranodal marginal zone lymphoma   07/21/18 PET/CT revealed Interval resection of the RIGHT middle lobe pulmonary nodule. 2. Increase thickening of subpleural tissue in the RIGHT upper lobe with increased metabolic activity. In patient with lymphoma history, cannot exclude atypical thoracic lymphoma. 3. No change in small hypermetabolic mediastinal lymph nodes which are favored reactive. 4. Prostatomegaly with nodularity. 5. Stable RIGHT adrenal mass. Favor benign.  2) Acute Bronchitis PLAN:  -Discussed pt labwork today, 03/18/19; slight leukopenia and lymphopenia, other blood counts and chemistries are stable -Discussed the 03/11/19 CT Chest which revealed "Ill-defined ground-glass and consolidative nodules in both lungs remain stable, except for mild increase in size of 1.6 cm central right perihilar nodule. Consider continued followup by chest CT in 6 months. 2. Stable mild partially-calcified mediastinal and bilateral hilar lymphadenopathy. 3. Stable 2.6 cm right adrenal mass,, likely a benign adenoma. Recommend continued attention on follow-up CT. 4. Aortic and coronary artery atherosclerosis."  -Discussed that center of right lung change due to inflammation? Nonspecific, will continue watchful observation with repeat CT scan in 6 months. Pt has a history of welding. -The pt shows no clinical or lab progression of his Extranodal Marginal Zone Lymphoma at this time. -No indication for further treatment at this time. -Short course of Doxycycline given his symptoms of bronchitis and some improvement with use of sons abx -Also referring pt to pulmonology given history of dust  inhalation and welding. Possible pneumoconiosis /Welders lung and rt perihilar mass? Pt prefers this referral as well. -Recommended again getting flu shots and pneumonia vaccines with PCP Dr Antony Contras -Could consider hearing aids with PCP as well -Will see the pt back in 6 months  2.  Patient Active Problem List   Diagnosis Date Noted   Renal insufficiency 04/19/2018   Chronic anticoagulation 04/19/2018   Lung mass 04/12/2018   Atrial fibrillation ([place]) 01/19/2018   Pneumothorax 01/25/2016   Pneumothorax after biopsy 01/25/2016   Coronary artery calcification seen on CT scan 01/21/2016   Murmur, cardiac 01/21/2016   Hyperlipidemia 01/21/2016   Essential hypertension 01/09/2016   BPH (benign prostatic hyperplasia) 01/09/2016   Solitary pulmonary nodule 01/09/2016   Thrombocytopenia ([place]) 01/09/2016   Cervical spondylosis without myelopathy 12/01/2013   Lumbago 12/01/2013   Diabetes mellitus      -Pulmonary referral for evaluation for possible pneumoconiosis /Welders lung and rt perihilar mass -CT chest wo contrast in 22 weeks -RTC with Dr Irene Limbo with labs in 24 weeks   All of the patients questions were answered with apparent satisfaction. The patient knows to call the clinic with any problems, questions or concerns.  The total time spent in the appt was 25 minutes and more than 50% was on counseling and direct patient cares.    Sullivan Lone MD MS AAHIVMS [facility] Hematology/Oncology Physician [facility]  (Office):       [phone] (Work cell):  [phone] (Fax):           [phone]  03/18/2019 12:35 PM  I, Baldwin Jamaica, am acting as a scribe for Dr. Sullivan Lone.   .I have reviewed the above documentation for accuracy and completeness, and I agree with the above. Brunetta Genera MD

## 2019-03-18 ENCOUNTER — Inpatient Hospital Stay: Payer: Medicare Other

## 2019-03-18 ENCOUNTER — Telehealth: Payer: Self-pay | Admitting: Hematology

## 2019-03-18 ENCOUNTER — Inpatient Hospital Stay: Payer: Medicare Other | Attending: Hematology | Admitting: Hematology

## 2019-03-18 ENCOUNTER — Other Ambulatory Visit: Payer: Self-pay

## 2019-03-18 VITALS — BP 124/60 | HR 65 | Temp 98.1°F | Resp 17 | Ht 70.0 in | Wt 189.1 lb

## 2019-03-18 DIAGNOSIS — Z7901 Long term (current) use of anticoagulants: Secondary | ICD-10-CM | POA: Insufficient documentation

## 2019-03-18 DIAGNOSIS — E785 Hyperlipidemia, unspecified: Secondary | ICD-10-CM | POA: Diagnosis not present

## 2019-03-18 DIAGNOSIS — I10 Essential (primary) hypertension: Secondary | ICD-10-CM | POA: Insufficient documentation

## 2019-03-18 DIAGNOSIS — Z79899 Other long term (current) drug therapy: Secondary | ICD-10-CM | POA: Diagnosis not present

## 2019-03-18 DIAGNOSIS — J209 Acute bronchitis, unspecified: Secondary | ICD-10-CM

## 2019-03-18 DIAGNOSIS — D72819 Decreased white blood cell count, unspecified: Secondary | ICD-10-CM | POA: Diagnosis not present

## 2019-03-18 DIAGNOSIS — Z87891 Personal history of nicotine dependence: Secondary | ICD-10-CM | POA: Insufficient documentation

## 2019-03-18 DIAGNOSIS — C8582 Other specified types of non-Hodgkin lymphoma, intrathoracic lymph nodes: Secondary | ICD-10-CM

## 2019-03-18 DIAGNOSIS — R911 Solitary pulmonary nodule: Secondary | ICD-10-CM

## 2019-03-18 DIAGNOSIS — I480 Paroxysmal atrial fibrillation: Secondary | ICD-10-CM | POA: Diagnosis not present

## 2019-03-18 LAB — CBC WITH DIFFERENTIAL/PLATELET
Abs Immature Granulocytes: 0.01 10*3/uL (ref 0.00–0.07)
Basophils Absolute: 0 10*3/uL (ref 0.0–0.1)
Basophils Relative: 0 %
Eosinophils Absolute: 0.1 10*3/uL (ref 0.0–0.5)
Eosinophils Relative: 3 %
HCT: 35.4 % — ABNORMAL LOW (ref 39.0–52.0)
Hemoglobin: 12.1 g/dL — ABNORMAL LOW (ref 13.0–17.0)
Immature Granulocytes: 0 %
Lymphocytes Relative: 16 %
Lymphs Abs: 0.5 10*3/uL — ABNORMAL LOW (ref 0.7–4.0)
MCH: 36.1 pg — ABNORMAL HIGH (ref 26.0–34.0)
MCHC: 34.2 g/dL (ref 30.0–36.0)
MCV: 105.7 fL — ABNORMAL HIGH (ref 80.0–100.0)
Monocytes Absolute: 0.2 10*3/uL (ref 0.1–1.0)
Monocytes Relative: 7 %
Neutro Abs: 2.3 10*3/uL (ref 1.7–7.7)
Neutrophils Relative %: 74 %
Platelets: 130 10*3/uL — ABNORMAL LOW (ref 150–400)
RBC: 3.35 MIL/uL — ABNORMAL LOW (ref 4.22–5.81)
RDW: 13 % (ref 11.5–15.5)
WBC: 3.2 10*3/uL — ABNORMAL LOW (ref 4.0–10.5)
nRBC: 0 % (ref 0.0–0.2)

## 2019-03-18 LAB — LACTATE DEHYDROGENASE: LDH: 142 U/L (ref 98–192)

## 2019-03-18 LAB — CMP (CANCER CENTER ONLY)
ALT: 31 U/L (ref 0–44)
AST: 22 U/L (ref 15–41)
Albumin: 4.1 g/dL (ref 3.5–5.0)
Alkaline Phosphatase: 91 U/L (ref 38–126)
Anion gap: 7 (ref 5–15)
BUN: 20 mg/dL (ref 8–23)
CO2: 29 mmol/L (ref 22–32)
Calcium: 8.3 mg/dL — ABNORMAL LOW (ref 8.9–10.3)
Chloride: 106 mmol/L (ref 98–111)
Creatinine: 1.32 mg/dL — ABNORMAL HIGH (ref 0.61–1.24)
GFR, Est AFR Am: 59 mL/min — ABNORMAL LOW (ref >60)
GFR, Estimated: 51 mL/min — ABNORMAL LOW (ref >60)
Glucose, Bld: 116 mg/dL — ABNORMAL HIGH (ref 70–99)
Potassium: 4 mmol/L (ref 3.5–5.1)
Sodium: 142 mmol/L (ref 135–145)
Total Bilirubin: 0.6 mg/dL (ref 0.3–1.2)
Total Protein: 6.5 g/dL (ref 6.5–8.1)

## 2019-03-18 MED ORDER — DOXYCYCLINE HYCLATE 100 MG PO TABS: 100.0000 mg | ORAL_TABLET | Freq: Two times a day (BID) | ORAL | 0 refills | Status: AC

## 2019-03-18 NOTE — Telephone Encounter (Signed)
Scheduled appt per 5/15 los. ° °A calendar will be mailed out. °

## 2019-05-01 ENCOUNTER — Other Ambulatory Visit: Payer: Self-pay | Admitting: Cardiovascular Disease

## 2019-05-02 NOTE — Telephone Encounter (Signed)
PT IS A 78YOM REQUESTING XARELTO 20MG . WT 85.8KG, SCR 1.32(03/18/19), CCR 56, LOV W/CROITORU(07/29/18)

## 2019-05-12 ENCOUNTER — Encounter: Payer: Self-pay | Admitting: Pulmonary Disease

## 2019-05-12 ENCOUNTER — Ambulatory Visit: Payer: Medicare Other | Admitting: Pulmonary Disease

## 2019-05-12 ENCOUNTER — Other Ambulatory Visit: Payer: Self-pay

## 2019-05-12 VITALS — BP 138/68 | HR 72 | Temp 98.2°F | Ht 70.0 in | Wt 189.6 lb

## 2019-05-12 DIAGNOSIS — R9389 Abnormal findings on diagnostic imaging of other specified body structures: Secondary | ICD-10-CM

## 2019-05-12 DIAGNOSIS — R911 Solitary pulmonary nodule: Secondary | ICD-10-CM | POA: Diagnosis not present

## 2019-05-12 NOTE — Progress Notes (Signed)
Subjective:     Patient ID: James Sweeney, male   DOB: Feb 21, 1941, 78 y.o.   MRN: [id number]  Patient being seen for an abnormal CT scan of the chest showing an increase nodule in the right lung  He has a history of MALT lymphoma He has had multiple CTs Bronchoscopy Resection of the lesion that revealed MALT lymphoma  He has no symptoms at present He has no fever No weight loss Good appetite  He is very hard of hearing  Denies any significant symptoms at the present time apart from pain and discomfort at the site of previous surgery From what he described it sounds like he had a pneumothorax required a chest tube placement He has some discomfort at the site of the tube  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   HENT: Positive for postnasal drip and rhinorrhea.   Eyes: Negative.   Respiratory: Positive for cough and shortness of breath.   Cardiovascular: Positive for chest pain.  Gastrointestinal: Negative.   Endocrine: Negative.   Genitourinary: Negative.   Musculoskeletal: Positive for back pain and neck pain.  Skin: Negative.   Allergic/Immunologic: Positive for environmental allergies.  Neurological: Negative.   Hematological: Bruises/bleeds easily.  Psychiatric/Behavioral: Negative.    Past Medical History:  Diagnosis Date  . Cervical spondylosis without myelopathy 12/01/2013  . Diabetes mellitus    borderline  . Enlarged prostate   . Frequency of urination   . Hearing deficit   . Heart murmur   . History of kidney stones    removed with Lithotripsy  . HOH (hard of hearing)    right ear  . Hyperlipemia   . Hypertension   . Lumbago 12/01/2013  . Pneumonia 2016  . Polio    right leg smaller than left  . Shortness of breath dyspnea    with exertion   Social History   Socioeconomic History  . Marital status: Married    Spouse name: Not on file  . Number of children: 2  . Years  of education: Not on file  . Highest education level: Not on file  Occupational History  . Not on file  Social Needs  . Financial resource strain: Not on file  . Food insecurity    Worry: Not on file    Inability: Not on file  . Transportation needs    Medical: Not on file    Non-medical: Not on file  Tobacco Use  . Smoking status: Former Smoker    Packs/day: 0.50    Years: 9.00    Pack years: 4.50    Types: Cigarettes    Quit date: 12/02/1967    Years since quitting: 51.4  . Smokeless tobacco: Former Systems developer    Types: Chew    Quit date: 2000  Substance and Sexual Activity  . Alcohol use: No  . Drug use: No  . Sexual activity: Never  Lifestyle  . Physical activity    Days per week: Not on file    Minutes per session: Not on file  . Stress: Not on file  Relationships  . Social Herbalist on phone: Not on file    Gets together: Not on file    Attends religious service: Not on file    Active member of club or organization: Not on file    Attends meetings of clubs or organizations: Not on file    Relationship  status: Not on file  . Intimate partner violence    Fear of current or ex partner: Not on file    Emotionally abused: Not on file    Physically abused: Not on file    Forced sexual activity: Not on file  Other Topics Concern  . Not on file  Social History Narrative   Patient is right handed.   Patient drinks 1 cup caffeine daily.      Pt has been married for 59 years and has 2 children, 4 grandchildren, and 3 great-grandchildren. Lives with everyone except great-grandchildren. He does not clean house, but can shop, do yard work, and drive. 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         Epworth Sleepiness Scale Score:  8      --I have HTN   --I seem to be losing my sex drive   --I wake up to urinate frequently at night   --I awake feeling not rested   --I have borderline diabetes   Family History  Problem Relation Age of  Onset  . Cancer Mother        Stomach  . Cancer - Lung Father   . Heart attack Brother   . Diabetes Brother        Objective:   Physical Exam Constitutional:      Appearance: Normal appearance.  HENT:     Head: Normocephalic and atraumatic.  Eyes:     General:        Right eye: No discharge.        Left eye: No discharge.  Neck:     Musculoskeletal: Normal range of motion and neck supple. No neck rigidity.  Cardiovascular:     Rate and Rhythm: Normal rate and regular rhythm.     Heart sounds: No murmur.  Pulmonary:     Effort: Pulmonary effort is normal. No respiratory distress.     Breath sounds: Normal breath sounds. No stridor. No wheezing or rhonchi.  Abdominal:     General: Abdomen is flat. There is no distension.     Palpations: There is no mass.     Tenderness: There is no abdominal tenderness.  Musculoskeletal: Normal range of motion.        General: No swelling.  Skin:    General: Skin is warm.     Coloration: Skin is not jaundiced.  Neurological:     General: No focal deficit present.     Mental Status: He is alert.     Cranial Nerves: No cranial nerve deficit.    Vitals:   05/12/19 1001  BP: 138/68  Pulse: 72  Temp: 98.2 F (36.8 C)  SpO2: 97%       Assessment:     Patient with a history of MALT lymphoma  Abnormal CT scan of the chest showing slight increase in a lung nodule in the right lung-increased from 1.2-1.6 Multiple other nodules remain stable, mediastinal adenopathy remains stable  History of atrial fibrillation  Coronary artery disease  Hypertension    Plan:     Plan is to repeat CT scan in 6 months to follow-up on the right lung nodule  Multiple nodules in the lung have remained stable  We will obtain a pulmonary function study  I will see the patient following his CT scan which will be repeated in 6 months  Encouraged to stay active

## 2019-05-12 NOTE — Patient Instructions (Signed)
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

## 2019-05-17 ENCOUNTER — Telehealth: Payer: Self-pay | Admitting: Hematology

## 2019-05-17 NOTE — Telephone Encounter (Signed)
Returned patient phone call regarding cancelling an appointment, spoke with patient's wife and per request appointment has been cancelled.

## 2019-06-14 ENCOUNTER — Telehealth: Payer: Self-pay | Admitting: *Deleted

## 2019-06-14 NOTE — Telephone Encounter (Signed)
Call placed to the patient and his wife. They have agreed to change their appointment to a virtual one.     Virtual Visit Pre-Appointment Phone Call  "(Name), I am calling you today to discuss your upcoming appointment. We are currently trying to limit exposure to the virus that causes COVID-19 by seeing patients at home rather than in the office."  1. "What is the BEST phone number to call the day of the visit?" - include this in appointment notes  2. Do you have or have access to (through a family member/friend) a smartphone with video capability that we can use for your visit?" a. If yes - list this number in appt notes as cell (if different from BEST phone #) and list the appointment type as a VIDEO visit in appointment notes b. If no - list the appointment type as a PHONE visit in appointment notes  Confirm consent - "In the setting of the current Covid19 crisis, you are scheduled for a (phone or video) visit with your provider on (date) at (time).  Just as we do with many in-office visits, in order for you to participate in this visit, we must obtain consent.  If you'd like, I can send this to your mychart (if signed up) or email for you to review.  Otherwise, I can obtain your verbal consent now.  All virtual visits are billed to your insurance company just like a normal visit would be.  By agreeing to a virtual visit, we'd like you to understand that the technology does not allow for your provider to perform an examination, and thus may limit your provider's ability to fully assess your condition. If your provider identifies any concerns that need to be evaluated in person, we will make arrangements to do so.  Finally, though the technology is pretty good, we cannot assure that it will always work on either your or our end, and in the setting of a video visit, we may have to convert it to a phone-only visit.  In either situation, we cannot ensure that we have a secure connection.  Are you  willing to proceed?"YES  3. Advise patient to be prepared - "Two hours prior to your appointment, go ahead and check your blood pressure, pulse, oxygen saturation, and your weight (if you have the equipment to check those) and write them all down. When your visit starts, your provider will ask you for this information. If you have an Apple Watch or Kardia device, please plan to have heart rate information ready on the day of your appointment. Please have a pen and paper handy nearby the day of the visit as well."  4. Give patient instructions for MyChart download to smartphone OR [email or web address] as below if video visit (depending on what platform provider is using)  5. Inform patient they will receive a phone call 15 minutes prior to their appointment time (may be from unknown caller ID) so they should be prepared to answer    TELEPHONE CALL NOTE  [place] has been deemed a candidate for a follow-up tele-health visit to limit community exposure during the Covid-19 pandemic. I spoke with the patient via phone to ensure availability of phone/video source, confirm preferred email & phone number, and discuss instructions and expectations.  I reminded James Sweeney to be prepared with any vital sign and/or heart rhythm information that could potentially be obtained via home monitoring, at the time of his visit. I reminded James Sweeney to  expect a phone call prior to his visit.  [name], RN 06/14/2019 11:51 AM     FULL LENGTH CONSENT FOR TELE-HEALTH VISIT   I hereby voluntarily request, consent and authorize [facility] and its employed or contracted physicians, physician assistants, nurse practitioners or other licensed health care professionals (the Practitioner), to provide me with telemedicine health care services (the Services") as deemed necessary by the treating Practitioner. I acknowledge and consent to receive the Services by the Practitioner via telemedicine. I  understand that the telemedicine visit will involve communicating with the Practitioner through live audiovisual communication technology and the disclosure of certain medical information by electronic transmission. I acknowledge that I have been given the opportunity to request an in-person assessment or other available alternative prior to the telemedicine visit and am voluntarily participating in the telemedicine visit.  I understand that I have the right to withhold or withdraw my consent to the use of telemedicine in the course of my care at any time, without affecting my right to future care or treatment, and that the Practitioner or I may terminate the telemedicine visit at any time. I understand that I have the right to inspect all information obtained and/or recorded in the course of the telemedicine visit and may receive copies of available information for a reasonable fee.  I understand that some of the potential risks of receiving the Services via telemedicine include:   Delay or interruption in medical evaluation due to technological equipment failure or disruption;  Information transmitted may not be sufficient (e.g. poor resolution of images) to allow for appropriate medical decision making by the Practitioner; and/or   In rare instances, security protocols could fail, causing a breach of personal health information.  Furthermore, I acknowledge that it is my responsibility to provide information about my medical history, conditions and care that is complete and accurate to the best of my ability. I acknowledge that Practitioner's advice, recommendations, and/or decision may be based on factors not within their control, such as incomplete or inaccurate data provided by me or distortions of diagnostic images or specimens that may result from electronic transmissions. I understand that the practice of medicine is not an exact science and that Practitioner makes no warranties or guarantees  regarding treatment outcomes. I acknowledge that I will receive a copy of this consent concurrently upon execution via email to the email address I last provided but may also request a printed copy by calling the office of [place].    I understand that my insurance will be billed for this visit.   I have read or had this consent read to me.  I understand the contents of this consent, which adequately explains the benefits and risks of the Services being provided via telemedicine.   I have been provided ample opportunity to ask questions regarding this consent and the Services and have had my questions answered to my satisfaction.  I give my informed consent for the services to be provided through the use of telemedicine in my medical care  By participating in this telemedicine visit I agree to the above.

## 2019-06-15 ENCOUNTER — Encounter: Payer: Self-pay | Admitting: Cardiovascular Disease

## 2019-06-15 ENCOUNTER — Telehealth (INDEPENDENT_AMBULATORY_CARE_PROVIDER_SITE_OTHER): Payer: Medicare Other | Admitting: Cardiovascular Disease

## 2019-06-15 VITALS — BP 122/66 | HR 73 | Ht 70.0 in | Wt 193.0 lb

## 2019-06-15 DIAGNOSIS — E7800 Pure hypercholesterolemia, unspecified: Secondary | ICD-10-CM

## 2019-06-15 DIAGNOSIS — I480 Paroxysmal atrial fibrillation: Secondary | ICD-10-CM | POA: Diagnosis not present

## 2019-06-15 DIAGNOSIS — I2510 Atherosclerotic heart disease of native coronary artery without angina pectoris: Secondary | ICD-10-CM | POA: Diagnosis not present

## 2019-06-15 DIAGNOSIS — I10 Essential (primary) hypertension: Secondary | ICD-10-CM | POA: Diagnosis not present

## 2019-06-15 DIAGNOSIS — I4819 Other persistent atrial fibrillation: Secondary | ICD-10-CM

## 2019-06-15 DIAGNOSIS — Z7901 Long term (current) use of anticoagulants: Secondary | ICD-10-CM

## 2019-06-15 NOTE — Patient Instructions (Signed)

## 2019-06-15 NOTE — Progress Notes (Addendum)
Virtual Visit via Telephone Note   This visit type was conducted due to national recommendations for restrictions regarding the COVID-19 Pandemic (e.g. social distancing) in an effort to limit this patient's exposure and mitigate transmission in our community.  Due to his co-morbid illnesses, this patient is at least at moderate risk for complications without adequate follow up.  This format is felt to be most appropriate for this patient at this time.  The patient did not have access to video technology/had technical difficulties with video requiring transitioning to audio format only (telephone).  All issues noted in this document were discussed and addressed.  No physical exam could be performed with this format.  Please refer to the patient's chart for his  consent to telehealth for [facility].   Date:  06/15/2019   ID:  [name], DOB 1941/08/04, MRN [id number]  Patient Location: Home Provider Location: Home  PCP:  Antony Contras, MD  Cardiologist:  Champayne Kocian Electrophysiologist:  None   Evaluation Performed:  Follow-Up Visit  Chief Complaint:  Edema  History of Present Illness:    James Sweeney is a 78 y.o. male with persistent atrial fibrillation, essential hypertension, treated hyperlipidemia, history of right lung MALT lymphoma.  Coronary artery calcifications were seen during work-up of his lung nodule, but a functional study showed low risk findings (nuclear stress test 2017 - normal perfusion, echo 2017 -normal LV function, aortic valve sclerosis without stenosis).  He is very hard of hearing.  He understood most of my questions today, but his wife Diane also helped.  Overall he is doing very well.  He has mild shortness of breath which has not changed since his lung surgery.  Recently he developed some leg swelling that is persistent even after he wakes up in the morning.  He was started on hydrochlorothiazide and after 3 days he is already noticed some improvement.   He had very little swelling when he woke up today.  He still has some back pain after his fall from a tree.  He denies chest discomfort, palpitations, dizziness, lightheadedness, syncope, focal neurological complaints, recent injuries or any bleeding problems.  The patient does not have symptoms concerning for COVID-19 infection (fever, chills, cough, or new shortness of breath).    Past Medical History:  Diagnosis Date  . Cervical spondylosis without myelopathy 12/01/2013  . Diabetes mellitus    borderline  . Enlarged prostate   . Frequency of urination   . Hearing deficit   . Heart murmur   . History of kidney stones    removed with Lithotripsy  . HOH (hard of hearing)    right ear  . Hyperlipemia   . Hypertension   . Lumbago 12/01/2013  . Pneumonia 2016  . Polio    right leg smaller than left  . Shortness of breath dyspnea    with exertion   Past Surgical History:  Procedure Laterality Date  . CATARACT EXTRACTION Bilateral 2014  . LUNG BIOPSY Right 2018   March  . VIDEO ASSISTED THORACOSCOPY (VATS)/WEDGE RESECTION Right 04/12/2018   Procedure: VIDEO ASSISTED THORACOSCOPY (VATS), WEDGE RESECTION RIGHT MIDDLE LOBE, PLACEMENT OF ON Q CATHETER;  Surgeon: [name], MD;  Location: [place];  Service: Thoracic;  Laterality: Right;  [name] VIDEO BRONCHOSCOPY N/A 04/12/2018   Procedure: VIDEO BRONCHOSCOPY;  Surgeon: [name], MD;  Location: [place];  Service: Thoracic;  Laterality: N/A;  . VIDEO BRONCHOSCOPY WITH ENDOBRONCHIAL NAVIGATION N/A 01/25/2016   Procedure: VIDEO BRONCHOSCOPY  WITH ENDOBRONCHIAL NAVIGATION;  Surgeon: [name], MD;  Location: [place];  Service: Thoracic;  Laterality: N/A;  . VIDEO BRONCHOSCOPY WITH ENDOBRONCHIAL ULTRASOUND N/A 01/25/2016   Procedure: VIDEO BRONCHOSCOPY WITH ENDOBRONCHIAL ULTRASOUND;  Surgeon: [name], MD;  Location: [facility];  Service: Thoracic;  Laterality: N/A;     Current Meds  Medication Sig  . alfuzosin (UROXATRAL)  10 MG 24 hr tablet Take 10 mg by mouth at bedtime.   [name] amLODipine (NORVASC) 10 MG tablet Take 10 mg by mouth daily.  . Ascorbic Acid (SUPER C COMPLEX PO) Take 1 capsule by mouth every evening. Super C 1000  . Calcium Carb-Cholecalciferol (CALCIUM 600+D3 PO) Take 1 tablet by mouth every evening.  . Cyanocobalamin (VITAMIN B-12) 5000 MCG SUBL Place 1 tablet under the tongue every evening.   . Ferrous Sulfate (IRON) 28 MG TABS Take 28 mg by mouth every evening.  . finasteride (PROSCAR) 5 MG tablet Take 5 mg by mouth at bedtime.   . hydrochlorothiazide (HYDRODIURIL) 12.5 MG tablet Take 12.5 mg by mouth daily.  [name] HYDROMET 5-1.5 MG/5ML syrup Take 5 mLs by mouth as needed. For cough  . Omega-3 Fatty Acids (FISH OIL) 1200 MG CAPS Take 1,200 mg by mouth every evening.  Vladimir Faster Glycol-Propyl Glycol (SYSTANE) 0.4-0.3 % SOLN Place 1 drop into both eyes 2 (two) times daily as needed (for dry/irritated eyes.).  [name] simvastatin (ZOCOR) 20 MG tablet Take 20 mg by mouth every evening.  . traMADol (ULTRAM) 50 MG tablet Take 1 tablet (50 mg total) by mouth every 6 (six) hours as needed (pain).  . Vitamin Mixture (SELENIUM-VITAMIN E) 50-400 MCG-UNIT CAPS Take 1 capsule by mouth every evening.  Alveda Reasons 20 MG TABS tablet TAKE 1 TABLET (20 MG TOTAL) BY MOUTH DAILY WITH SUPPER.     Allergies:   Levaquin [levofloxacin in d5w] and Penicillins   Social History   Tobacco Use  . Smoking status: Former Smoker    Packs/day: 0.50    Years: 9.00    Pack years: 4.50    Types: Cigarettes    Quit date: 12/02/1967    Years since quitting: 51.5  . Smokeless tobacco: Former Systems developer    Types: Chew    Quit date: 2000  Substance Use Topics  . Alcohol use: No  . Drug use: No     Family Hx: The patient's family history includes Cancer in his mother; Cancer - Lung in his father; Diabetes in his brother; Heart attack in his brother.  ROS:   Please see the history of present illness.    All other systems reviewed and are  negative.   Prior CV studies:   The following studies were reviewed today:  Notes from pulmonary specialist and CT of the lung (showing slight increase in the size of the right lung nodule from 1.2cm to 1.6 cm).  Labs/Other Tests and Data Reviewed:    EKG:  An ECG dated 04/08/2018 was personally reviewed today and demonstrated:  Atrial fibrillation.  Recent Labs: 03/18/2019: ALT 31; BUN 20; Creatinine 1.32; Hemoglobin 12.1; Platelets 130; Potassium 4.0; Sodium 142  Hemoglobin A1c 6.3% Recent Lipid Panel 03/21/2019 total cholesterol 117, HDL 49, LDL 60, triglycerides 35  Wt Readings from Last 3 Encounters:  06/15/19 193 lb (87.5 kg)  05/12/19 189 lb 9.6 oz (86 kg)  03/18/19 189 lb 1.6 oz (85.8 kg)     Objective:    Vital Signs:  BP 122/66   Pulse 73  Ht 5\' 10"  (1.778 m)   Wt 193 lb (87.5 kg)   BMI 27.69 kg/m    VITAL SIGNS:  reviewed Unable to examine  ASSESSMENT & PLAN:    1. CAD: Extensive coronary calcifications but he does not have angina pectoris and had normal perfusion on a previous nuclear stress testing.  All his risk factors appear to be well addressed. 2. Atrial fibrillation: Asymptomatic, spontaneously well rate controlled, on chronic anticoagulation.  Avoid medications with a negative chronotropic effect since these may cause excessive bradycardia.  CHADSVasc 3-4 (age 89, HTN, +/- CAD). 3. Anticoagulation: Well-tolerated without recent falls or bleeding complications. 4.  HLP: Excellent lipid profile on current statin regimen. 5. HTN: Excellent BP level.  Expect blood pressure may decrease further as the hydrochlorothiazide kicks in fully.  May need to decrease his amlodipine if he develops symptoms of low blood pressure. 6.  Edema: likely a side effect of amlodipine, possibly with some contribution of right heart disease.  Improving with hydrochlorothiazide.  Discussed sodium restriction in detail with Mr. and Mrs. Chrissie Noa.  COVID-19 Education: The signs and  symptoms of COVID-19 were discussed with the patient and how to seek care for testing (follow up with PCP or arrange E-visit).  The importance of social distancing was discussed today.  Time:   Today, I have spent 17 minutes with the patient with telehealth technology discussing the above problems.     Medication Adjustments/Labs and Tests Ordered: Current medicines are reviewed at length with the patient today.  Concerns regarding medicines are outlined above.   Tests Ordered: No orders of the defined types were placed in this encounter.   Medication Changes: No orders of the defined types were placed in this encounter.   Follow Up:  Virtual Visit or In Person 12 months  Signed, Sanda Klein, MD  06/15/2019 9:02 AM    [place]

## 2019-06-16 ENCOUNTER — Other Ambulatory Visit: Payer: Self-pay

## 2019-06-16 MED ORDER — RIVAROXABAN 20 MG PO TABS: ORAL_TABLET | ORAL | 3 refills | Status: DC

## 2019-06-17 ENCOUNTER — Ambulatory Visit (INDEPENDENT_AMBULATORY_CARE_PROVIDER_SITE_OTHER)
Admission: RE | Admit: 2019-06-17 | Discharge: 2019-06-17 | Disposition: A | Discharge status: Home / Self Care | Payer: Medicare Other | Source: Ambulatory Visit | Attending: Pulmonary Disease | Admitting: Pulmonary Disease

## 2019-06-17 ENCOUNTER — Other Ambulatory Visit: Payer: Self-pay

## 2019-06-17 DIAGNOSIS — R911 Solitary pulmonary nodule: Secondary | ICD-10-CM | POA: Diagnosis not present

## 2019-06-17 DIAGNOSIS — R9389 Abnormal findings on diagnostic imaging of other specified body structures: Secondary | ICD-10-CM | POA: Diagnosis not present

## 2019-07-21 ENCOUNTER — Other Ambulatory Visit: Payer: Self-pay | Admitting: *Deleted

## 2019-07-21 DIAGNOSIS — I739 Peripheral vascular disease, unspecified: Secondary | ICD-10-CM

## 2019-07-29 ENCOUNTER — Ambulatory Visit (HOSPITAL_COMMUNITY)
Admission: RE | Admit: 2019-07-29 | Discharge: 2019-07-29 | Disposition: A | Discharge status: Home / Self Care | Payer: Medicare Other | Source: Ambulatory Visit | Attending: Cardiology | Admitting: Cardiology

## 2019-07-29 ENCOUNTER — Other Ambulatory Visit: Payer: Self-pay

## 2019-07-29 DIAGNOSIS — I739 Peripheral vascular disease, unspecified: Secondary | ICD-10-CM | POA: Insufficient documentation

## 2019-08-12 ENCOUNTER — Other Ambulatory Visit: Payer: Self-pay

## 2019-08-12 ENCOUNTER — Ambulatory Visit (INDEPENDENT_AMBULATORY_CARE_PROVIDER_SITE_OTHER): Payer: Medicare Other

## 2019-08-12 ENCOUNTER — Encounter (HOSPITAL_COMMUNITY): Payer: Self-pay

## 2019-08-12 ENCOUNTER — Ambulatory Visit (HOSPITAL_COMMUNITY)
Admission: EM | Admit: 2019-08-12 | Discharge: 2019-08-12 | Disposition: A | Discharge status: Home / Self Care | Payer: Medicare Other | Attending: Family Medicine | Admitting: Family Medicine

## 2019-08-12 DIAGNOSIS — Z20828 Contact with and (suspected) exposure to other viral communicable diseases: Secondary | ICD-10-CM | POA: Diagnosis not present

## 2019-08-12 DIAGNOSIS — R0602 Shortness of breath: Secondary | ICD-10-CM

## 2019-08-12 DIAGNOSIS — M791 Myalgia, unspecified site: Secondary | ICD-10-CM | POA: Diagnosis not present

## 2019-08-12 DIAGNOSIS — R05 Cough: Secondary | ICD-10-CM | POA: Diagnosis not present

## 2019-08-12 NOTE — ED Triage Notes (Signed)
Pt presents with slight intermittent non productive cough, some shortness of breath, and generalized body aches for over a week.

## 2019-08-12 NOTE — ED Provider Notes (Signed)
[place]    CSN: [id number] Arrival date & time: 08/12/19  1008      History   Chief Complaint Chief Complaint  Patient presents with  . Shortness of Breath  . Generalized Body Aches  . Cough    HPI James Sweeney is a 78 y.o. male.   Patient here concerned with cough x 1 week.  Cough is productive at times.  States he had lung operation 2 years ago and has had SOB since then and is at baseline.  Admits myalgia x 4 months.  Denies fever, chills, URI sx, nausea, vomiting, diarrhea, wheezing, loss of taste.  Grandson tested positive to [place] 1 week ago, last saw his grandson 1 week ago.  PMH include Pulmonary nodule, sees pulmonologist, last CT scan of chest 3 months ago, a. Fib (sees cardiology), DM.     Past Medical History:  Diagnosis Date  . Cervical spondylosis without myelopathy 12/01/2013  . Diabetes mellitus    borderline  . Enlarged prostate   . Frequency of urination   . Hearing deficit   . Heart murmur   . History of kidney stones    removed with Lithotripsy  . HOH (hard of hearing)    right ear  . Hyperlipemia   . Hypertension   . Lumbago 12/01/2013  . Pneumonia 2016  . Polio    right leg smaller than left  . Shortness of breath dyspnea    with exertion    Patient Active Problem List   Diagnosis Date Noted  . Renal insufficiency 04/19/2018  . Chronic anticoagulation 04/19/2018  . Lung mass 04/12/2018  . Atrial fibrillation ([place]) 01/19/2018  . Pneumothorax 01/25/2016  . Pneumothorax after biopsy 01/25/2016  . Coronary artery calcification seen on CT scan 01/21/2016  . Murmur, cardiac 01/21/2016  . Hyperlipidemia 01/21/2016  . Essential hypertension 01/09/2016  . BPH (benign prostatic hyperplasia) 01/09/2016  . Solitary pulmonary nodule 01/09/2016  . Thrombocytopenia ([place]) 01/09/2016  . Cervical spondylosis without myelopathy 12/01/2013  . Lumbago 12/01/2013  . Diabetes mellitus     Past Surgical History:  Procedure  Laterality Date  . CATARACT EXTRACTION Bilateral 2014  . LUNG BIOPSY Right 2018   March  . VIDEO ASSISTED THORACOSCOPY (VATS)/WEDGE RESECTION Right 04/12/2018   Procedure: VIDEO ASSISTED THORACOSCOPY (VATS), WEDGE RESECTION RIGHT MIDDLE LOBE, PLACEMENT OF ON Q CATHETER;  Surgeon: [name], MD;  Location: [place];  Service: Thoracic;  Laterality: Right;  [name] VIDEO BRONCHOSCOPY N/A 04/12/2018   Procedure: VIDEO BRONCHOSCOPY;  Surgeon: [name], MD;  Location[facility];  Service: Thoracic;  Laterality: N/A;  . VIDEO BRONCHOSCOPY WITH ENDOBRONCHIAL NAVIGATION N/A 01/25/2016   Procedure: VIDEO BRONCHOSCOPY WITH ENDOBRONCHIAL NAVIGATION;  Surgeon: [name], MD;  Location: [place];  Service: Thoracic;  Laterality: N/A;  . VIDEO BRONCHOSCOPY WITH ENDOBRONCHIAL ULTRASOUND N/A 01/25/2016   Procedure: VIDEO BRONCHOSCOPY WITH ENDOBRONCHIAL ULTRASOUND;  Surgeon: [name], MD;  [facility];  Service: Thoracic;  Laterality: N/A;       Home Medications    Prior to Admission medications   Medication Sig Start Date End Date Taking? Authorizing Provider  alfuzosin (UROXATRAL) 10 MG 24 hr tablet Take 10 mg by mouth at bedtime.     [provider]  amLODipine (NORVASC) 10 MG tablet Take 10 mg by mouth daily.    [provider]  Ascorbic Acid (SUPER C COMPLEX PO) Take 1 capsule by mouth every evening. Super C 1000  [provider]  Calcium Carb-Cholecalciferol (CALCIUM 600+D3 PO) Take 1 tablet by mouth every evening.    [provider]  Cyanocobalamin (VITAMIN B-12) 5000 MCG SUBL Place 1 tablet under the tongue every evening.     [provider]  Ferrous Sulfate (IRON) 28 MG TABS Take 28 mg by mouth every evening.    [provider]  finasteride (PROSCAR) 5 MG tablet Take 5 mg by mouth at bedtime.  05/16/15   [provider]  hydrochlorothiazide (HYDRODIURIL) 12.5 MG tablet Take 12.5 mg by mouth daily.    [provider]  HYDROMET 5-1.5 MG/5ML syrup Take 5 mLs by mouth as needed. For cough 07/28/18   [provider]  Omega-3 Fatty Acids (FISH OIL) 1200 MG CAPS Take 1,200 mg by mouth every evening.    [provider]  Polyethyl Glycol-Propyl Glycol (SYSTANE) 0.4-0.3 % SOLN Place 1 drop into both eyes 2 (two) times daily as needed (for dry/irritated eyes.).    [provider]  rivaroxaban (XARELTO) 20 MG TABS tablet TAKE 1 TABLET (20 MG TOTAL) BY MOUTH DAILY WITH SUPPER. 06/16/19   Croitoru, Mihai, MD  simvastatin (ZOCOR) 20 MG tablet Take 20 mg by mouth every evening.    [provider]  traMADol (ULTRAM) 50 MG tablet Take 1 tablet (50 mg total) by mouth every 6 (six) hours as needed (pain). 04/23/18   [name], MD  Vitamin Mixture (SELENIUM-VITAMIN E) 50-400 MCG-UNIT CAPS Take 1 capsule by mouth every evening.    [provider]    Family History Family History  Problem Relation Age of Onset  . Cancer Mother        Stomach  . Cancer - Lung Father   . Heart attack Brother   . Diabetes Brother     Social History Social History   Tobacco Use  . Smoking status: Former Smoker    Packs/day: 0.50    Years: 9.00    Pack years: 4.50    Types: Cigarettes    Quit date: 12/02/1967    Years since quitting: 51.7  . Smokeless tobacco: Former Systems developer    Types: Chew    Quit date: 2000  Substance Use Topics  . Alcohol use: No  . Drug use: No     Allergies   Levaquin [levofloxacin in d5w] and Penicillins   Review of Systems Review of Systems  Constitutional: Negative for activity change, appetite change, chills, fatigue and fever.  HENT: Negative for congestion, ear discharge, ear pain, nosebleeds, postnasal drip, rhinorrhea, sinus pain, sneezing and sore throat.   Respiratory: Positive for shortness of breath. Negative for cough, chest tightness and wheezing.   Cardiovascular: Negative for chest pain, palpitations and leg swelling.   Gastrointestinal: Negative for diarrhea, nausea and vomiting.  Musculoskeletal: Positive for arthralgias and myalgias.  Skin: Negative for rash.  Neurological: Negative for dizziness and speech difficulty.  Psychiatric/Behavioral: Negative for confusion and sleep disturbance.     Physical Exam Triage Vital Signs ED Triage Vitals  Enc Vitals Group     BP 08/12/19 1030 (!) 145/68     Pulse Rate 08/12/19 1030 76     Resp 08/12/19 1030 16     Temp 08/12/19 1030 97.9 F (36.6 C)     Temp Source 08/12/19 1030 Oral     SpO2 08/12/19 1030 99 %     Weight --      Height --      Head Circumference --  Peak Flow --      Pain Score 08/12/19 1031 4     Pain Loc --      Pain Edu? --      Excl. in [place]? --    No data found.  Updated Vital Signs BP (!) 145/68 (BP Location: Left Arm)   Pulse 76   Temp 97.9 F (36.6 C) (Oral)   Resp 16   SpO2 99%   Visual Acuity Right Eye Distance:   Left Eye Distance:   Bilateral Distance:    Right Eye Near:   Left Eye Near:    Bilateral Near:     Physical Exam Vitals signs and nursing note reviewed.  Constitutional:      General: He is not in acute distress.    Appearance: He is well-developed. He is not ill-appearing or toxic-appearing.  HENT:     Head: Normocephalic and atraumatic.  Eyes:     Conjunctiva/sclera: Conjunctivae normal.  Neck:     Musculoskeletal: Normal range of motion and neck supple.  Cardiovascular:     Rate and Rhythm: Normal rate. Rhythm irregular.     Heart sounds: No murmur.  Pulmonary:     Effort: Pulmonary effort is normal. No tachypnea, accessory muscle usage or respiratory distress.     Breath sounds: Normal breath sounds. No wheezing, rhonchi or rales.  Abdominal:     Palpations: Abdomen is soft.     Tenderness: There is no abdominal tenderness.  Musculoskeletal: Normal range of motion.  Skin:    General: Skin is warm and dry.     Capillary Refill: Capillary refill takes less than 2 seconds.   Neurological:     General: No focal deficit present.     Mental Status: He is alert and oriented to person, place, and time.     Comments: Extremely hard of hearing      UC Treatments / Results  Labs (all labs ordered are listed, but only abnormal results are displayed) Labs Reviewed  NOVEL CORONAVIRUS, NAA (HOSP ORDER, SEND-OUT TO REF LAB; TAT 18-24 HRS)    EKG   Radiology Dg Chest 2 View  Result Date: 08/12/2019 CLINICAL DATA:  Cough and shortness of breath EXAM: CHEST - 2 VIEW COMPARISON:  None. FINDINGS: The heart size is normal. Vascular calcifications are seen in the aortic arch. Opacity overlying the right middle lobe likely corresponds to the pleural and parenchymal opacities seen on prior chest CT. There is mild bibasilar atelectasis/airspace disease. There is no pleural effusion or pneumothorax. The visualized skeletal structures are unremarkable. IMPRESSION: 1. Mild bibasilar atelectasis/airspace disease. 2. Right middle lobe opacity, corresponding to the pleural and parenchymal opacities seen on prior chest CT. 3. Aortic atherosclerosis. Electronically Signed   By: Zerita Boers M.D.   On: 08/12/2019 11:55    Procedures Procedures (including critical care time)  Medications Ordered in UC Medications - No data to display  Initial Impression / Assessment and Plan / UC Course  I have reviewed the triage vital signs and the nursing notes.  Pertinent labs & imaging results that were available during my care of the patient were reviewed by me and considered in my medical decision making (see chart for details).  Clinical Course as of Aug 11 1212  Fri Aug 12, 2019  1128 DG Chest 2 View [JL]  1149 DG Chest 2 View [JL]    Clinical Course User Index [JL] Peri Jefferson, [place]     Final Clinical Impressions(s) / UC Diagnoses  Final diagnoses:  Exposure to SARS-associated coronavirus  SOB (shortness of breath)     Discharge Instructions     Follow up with PCP  and pulmonology Go to ER with any worsening symptoms.    ED Prescriptions    None     PDMP not reviewed this encounter.   Peri Jefferson, PA-C 08/12/19 1213

## 2019-08-12 NOTE — Discharge Instructions (Addendum)
Follow up with PCP and pulmonology Go to ER with any worsening symptoms.

## 2019-08-14 LAB — NOVEL CORONAVIRUS, NAA (HOSP ORDER, SEND-OUT TO REF LAB; TAT 18-24 HRS): SARS-CoV-2, NAA: NOT DETECTED

## 2019-09-02 ENCOUNTER — Ambulatory Visit: Payer: Medicare Other | Admitting: Hematology

## 2019-09-02 ENCOUNTER — Other Ambulatory Visit: Payer: Medicare Other

## 2019-09-18 ENCOUNTER — Emergency Department (HOSPITAL_COMMUNITY): Payer: Medicare Other

## 2019-09-18 ENCOUNTER — Emergency Department (HOSPITAL_COMMUNITY)
Admission: EM | Admit: 2019-09-18 | Discharge: 2019-09-18 | Disposition: A | Discharge status: Home / Self Care | Payer: Medicare Other | Attending: Emergency Medicine | Admitting: Emergency Medicine

## 2019-09-18 ENCOUNTER — Other Ambulatory Visit: Payer: Self-pay

## 2019-09-18 ENCOUNTER — Encounter (HOSPITAL_COMMUNITY): Payer: Self-pay | Admitting: Emergency Medicine

## 2019-09-18 DIAGNOSIS — R079 Chest pain, unspecified: Secondary | ICD-10-CM | POA: Insufficient documentation

## 2019-09-18 DIAGNOSIS — I4819 Other persistent atrial fibrillation: Secondary | ICD-10-CM | POA: Insufficient documentation

## 2019-09-18 DIAGNOSIS — E1122 Type 2 diabetes mellitus with diabetic chronic kidney disease: Secondary | ICD-10-CM | POA: Diagnosis not present

## 2019-09-18 DIAGNOSIS — N189 Chronic kidney disease, unspecified: Secondary | ICD-10-CM | POA: Diagnosis not present

## 2019-09-18 DIAGNOSIS — Z7901 Long term (current) use of anticoagulants: Secondary | ICD-10-CM | POA: Diagnosis not present

## 2019-09-18 DIAGNOSIS — I129 Hypertensive chronic kidney disease with stage 1 through stage 4 chronic kidney disease, or unspecified chronic kidney disease: Secondary | ICD-10-CM | POA: Insufficient documentation

## 2019-09-18 DIAGNOSIS — Z8572 Personal history of non-Hodgkin lymphomas: Secondary | ICD-10-CM | POA: Diagnosis not present

## 2019-09-18 DIAGNOSIS — R0602 Shortness of breath: Secondary | ICD-10-CM | POA: Diagnosis not present

## 2019-09-18 DIAGNOSIS — Z79899 Other long term (current) drug therapy: Secondary | ICD-10-CM | POA: Diagnosis not present

## 2019-09-18 LAB — TROPONIN I (HIGH SENSITIVITY)
Troponin I (High Sensitivity): 7 ng/L (ref <18)
Troponin I (High Sensitivity): 6 ng/L (ref <18)
Troponin I (High Sensitivity): 6 ng/L (ref <18)

## 2019-09-18 LAB — BASIC METABOLIC PANEL
Anion gap: 10 (ref 5–15)
BUN: 18 mg/dL (ref 8–23)
CO2: 25 mmol/L (ref 22–32)
Calcium: 9.2 mg/dL (ref 8.9–10.3)
Chloride: 104 mmol/L (ref 98–111)
Creatinine, Ser: 1.21 mg/dL (ref 0.61–1.24)
GFR calc Af Amer: >60 mL/min (ref >60)
GFR calc non Af Amer: 57 mL/min — ABNORMAL LOW (ref >60)
Glucose, Bld: 157 mg/dL — ABNORMAL HIGH (ref 70–99)
Potassium: 4 mmol/L (ref 3.5–5.1)
Sodium: 139 mmol/L (ref 135–145)

## 2019-09-18 LAB — CBC
HCT: 36.5 % — ABNORMAL LOW (ref 39.0–52.0)
Hemoglobin: 12.2 g/dL — ABNORMAL LOW (ref 13.0–17.0)
MCH: 35.9 pg — ABNORMAL HIGH (ref 26.0–34.0)
MCHC: 33.4 g/dL (ref 30.0–36.0)
MCV: 107.4 fL — ABNORMAL HIGH (ref 80.0–100.0)
Platelets: 132 10*3/uL — ABNORMAL LOW (ref 150–400)
RBC: 3.4 MIL/uL — ABNORMAL LOW (ref 4.22–5.81)
RDW: 13.3 % (ref 11.5–15.5)
WBC: 2.5 10*3/uL — ABNORMAL LOW (ref 4.0–10.5)
nRBC: 0 % (ref 0.0–0.2)

## 2019-09-18 LAB — D-DIMER, QUANTITATIVE: D-Dimer, Quant: <0.27 ug/mL-FEU (ref 0.00–0.50)

## 2019-09-18 MED ORDER — IOHEXOL 350 MG/ML SOLN
100.0000 mL | Freq: Once | INTRAVENOUS | Status: AC | PRN
  Administered 2019-09-18: 100 mL via INTRAVENOUS

## 2019-09-18 MED ORDER — SODIUM CHLORIDE 0.9% FLUSH: 3.0000 mL | Freq: Once | INTRAVENOUS | Status: DC

## 2019-09-18 MED ORDER — ASPIRIN 81 MG PO CHEW
324.0000 mg | CHEWABLE_TABLET | Freq: Once | ORAL | Status: AC
  Administered 2019-09-18: 324 mg via ORAL
  Filled 2019-09-18: qty 4

## 2019-09-18 NOTE — ED Provider Notes (Signed)
[facility] Provider Note   CSN: [id number] Arrival date & time: 09/18/19  [id number]     History   Chief Complaint Chief Complaint  Patient presents with  . Chest Pain    HPI James [facility] is a 78 y.o. male.     Level 5 caveat for hearing deficit.  Patient here with chest pain that has been intermittent for the past 3 days.  Reports pain in the center of his chest lasting for several minutes to hours at a time.  It was somewhat worse yesterday when he was working underneath a car.  He does not notice anything that makes it better or worse.  There has been no shortness of breath, cough, nausea, vomiting or fever.  Denies any diaphoresis. States the pain is worse his xiphoid process is worse with palpation.  No abdominal pain, nausea, vomiting or diarrhea.  No leg pain or leg swelling.  Does have a history of atrial fibrillation on Xarelto but denies any previous MI history. Has chronic SOB since lung surgery several years ago but this seems unchanged.  PMH per Dr. Orene Desanctis: James [facility] is a 78 y.o. male with persistent atrial fibrillation, essential hypertension, treated hyperlipidemia, history of right lung MALT lymphoma.  Coronary artery calcifications were seen during work-up of his lung nodule, but a functional study showed low risk findings (nuclear stress test 2017 - normal perfusion, echo 2017 -normal LV function, aortic valve sclerosis without stenosis).  The history is provided by the patient and the spouse.  Chest Pain Associated symptoms: shortness of breath   Associated symptoms: no abdominal pain, no dizziness, no fever, no headache, no nausea, no vomiting and no weakness     Past Medical History:  Diagnosis Date  . Cervical spondylosis without myelopathy 12/01/2013  . Diabetes mellitus    borderline  . Enlarged prostate   . Frequency of urination   . Hearing deficit   . Heart murmur   . History of kidney stones    removed  with Lithotripsy  . HOH (hard of hearing)    right ear  . Hyperlipemia   . Hypertension   . Lumbago 12/01/2013  . Pneumonia 2016  . Polio    right leg smaller than left  . Shortness of breath dyspnea    with exertion    Patient Active Problem List   Diagnosis Date Noted  . Renal insufficiency 04/19/2018  . Chronic anticoagulation 04/19/2018  . Lung mass 04/12/2018  . Atrial fibrillation ([place]) 01/19/2018  . Pneumothorax 01/25/2016  . Pneumothorax after biopsy 01/25/2016  . Coronary artery calcification seen on CT scan 01/21/2016  . Murmur, cardiac 01/21/2016  . Hyperlipidemia 01/21/2016  . Essential hypertension 01/09/2016  . BPH (benign prostatic hyperplasia) 01/09/2016  . Solitary pulmonary nodule 01/09/2016  . Thrombocytopenia ([place]) 01/09/2016  . Cervical spondylosis without myelopathy 12/01/2013  . Lumbago 12/01/2013  . Diabetes mellitus     Past Surgical History:  Procedure Laterality Date  . CATARACT EXTRACTION Bilateral 2014  . LUNG BIOPSY Right 2018   March  . VIDEO ASSISTED THORACOSCOPY (VATS)/WEDGE RESECTION Right 04/12/2018   Procedure: VIDEO ASSISTED THORACOSCOPY (VATS), WEDGE RESECTION RIGHT MIDDLE LOBE, PLACEMENT OF ON Q CATHETER;  Surgeon: [name], MD;  Location: [facility];  Service: Thoracic;  Laterality: Right;  [name] VIDEO BRONCHOSCOPY N/A 04/12/2018   Procedure: VIDEO BRONCHOSCOPY;  Surgeon: [name], MD;  Location: [facility];  Service: Thoracic;  Laterality: N/A;  . VIDEO  BRONCHOSCOPY WITH ENDOBRONCHIAL NAVIGATION N/A 01/25/2016   Procedure: VIDEO BRONCHOSCOPY WITH ENDOBRONCHIAL NAVIGATION;  Surgeon: [name], MD;  Location: [facility];  Service: Thoracic;  Laterality: N/A;  . VIDEO BRONCHOSCOPY WITH ENDOBRONCHIAL ULTRASOUND N/A 01/25/2016   Procedure: VIDEO BRONCHOSCOPY WITH ENDOBRONCHIAL ULTRASOUND;  Surgeon: [name], MD;  Location: [facility];  Service: Thoracic;  Laterality: N/A;        Home Medications    Prior to Admission  medications   Medication Sig Start Date End Date Taking? Authorizing Provider  alfuzosin (UROXATRAL) 10 MG 24 hr tablet Take 10 mg by mouth at bedtime.     [provider]  amLODipine (NORVASC) 10 MG tablet Take 10 mg by mouth daily.    [provider]  Ascorbic Acid (SUPER C COMPLEX PO) Take 1 capsule by mouth every evening. Super C 1000    [provider]  Calcium Carb-Cholecalciferol (CALCIUM 600+D3 PO) Take 1 tablet by mouth every evening.    [provider]  Cyanocobalamin (VITAMIN B-12) 5000 MCG SUBL Place 1 tablet under the tongue every evening.     [provider]  Ferrous Sulfate (IRON) 28 MG TABS Take 28 mg by mouth every evening.    [provider]  finasteride (PROSCAR) 5 MG tablet Take 5 mg by mouth at bedtime.  05/16/15   [provider]  hydrochlorothiazide (HYDRODIURIL) 12.5 MG tablet Take 12.5 mg by mouth daily.    [provider]  HYDROMET 5-1.5 MG/5ML syrup Take 5 mLs by mouth as needed. For cough 07/28/18   [provider]  Omega-3 Fatty Acids (FISH OIL) 1200 MG CAPS Take 1,200 mg by mouth every evening.    [provider]  Polyethyl Glycol-Propyl Glycol (SYSTANE) 0.4-0.3 % SOLN Place 1 drop into both eyes 2 (two) times daily as needed (for dry/irritated eyes.).    [provider]  rivaroxaban (XARELTO) 20 MG TABS tablet TAKE 1 TABLET (20 MG TOTAL) BY MOUTH DAILY WITH SUPPER. 06/16/19   Croitoru, Mihai, MD  simvastatin (ZOCOR) 20 MG tablet Take 20 mg by mouth every evening.    [provider]  traMADol (ULTRAM) 50 MG tablet Take 1 tablet (50 mg total) by mouth every 6 (six) hours as needed (pain). 04/23/18   [name], MD  Vitamin Mixture (SELENIUM-VITAMIN E) 50-400 MCG-UNIT CAPS Take 1 capsule by mouth every evening.    [provider]    Family History Family History  Problem Relation Age of Onset  . Cancer Mother        Stomach  . Cancer - Lung  Father   . Heart attack Brother   . Diabetes Brother     Social History Social History   Tobacco Use  . Smoking status: Former Smoker    Packs/day: 0.50    Years: 9.00    Pack years: 4.50    Types: Cigarettes    Quit date: 12/02/1967    Years since quitting: 51.8  . Smokeless tobacco: Former Systems developer    Types: Chew    Quit date: 2000  Substance Use Topics  . Alcohol use: No  . Drug use: No     Allergies   Levaquin [levofloxacin in d5w] and Penicillins   Review of Systems Review of Systems  Constitutional: Negative for activity change, appetite change and fever.  HENT: Negative for congestion.   Eyes: Negative for visual disturbance.  Respiratory: Positive for chest tightness and shortness of breath.   Cardiovascular: Positive for  chest pain.  Gastrointestinal: Negative for abdominal pain, nausea and vomiting.  Genitourinary: Negative for dysuria and hematuria.  Musculoskeletal: Negative for arthralgias and myalgias.  Skin: Negative for rash.  Neurological: Negative for dizziness, weakness and headaches.   all other systems are negative except as noted in the HPI and PMH.     Physical Exam Updated Vital Signs BP (!) 158/46 (BP Location: Right Arm)   Pulse 65   Temp 97.8 F (36.6 C) (Oral)   Resp 14   SpO2 100%   Physical Exam Vitals signs and nursing note reviewed.  Constitutional:      General: He is not in acute distress.    Appearance: He is well-developed.  HENT:     Head: Normocephalic and atraumatic.     Mouth/Throat:     Pharynx: No oropharyngeal exudate.  Eyes:     Conjunctiva/sclera: Conjunctivae normal.     Pupils: Pupils are equal, round, and reactive to light.  Neck:     Musculoskeletal: Normal range of motion and neck supple.     Comments: No meningismus. Cardiovascular:     Rate and Rhythm: Normal rate. Rhythm irregular.     Heart sounds: Normal heart sounds. No murmur.  Pulmonary:     Effort: Pulmonary effort is normal. No respiratory  distress.     Breath sounds: Normal breath sounds.     Comments: Reproducible xiphoid tenderness Chest:     Chest wall: Tenderness present.  Abdominal:     Palpations: Abdomen is soft.     Tenderness: There is no abdominal tenderness. There is no guarding or rebound.  Musculoskeletal: Normal range of motion.        General: No tenderness.  Skin:    General: Skin is warm.  Neurological:     Mental Status: He is alert and oriented to person, place, and time.     Cranial Nerves: No cranial nerve deficit.     Motor: No abnormal muscle tone.     Coordination: Coordination normal.     Comments: No ataxia on finger to nose bilaterally. No pronator drift. 5/5 strength throughout. CN 2-12 intact.Equal grip strength. Sensation intact.   Psychiatric:        Behavior: Behavior normal.      ED Treatments / Results  Labs (all labs ordered are listed, but only abnormal results are displayed) Labs Reviewed  BASIC METABOLIC PANEL - Abnormal; Notable for the following components:      Result Value   Glucose, Bld 157 (*)    GFR calc non Af Amer 57 (*)    All other components within normal limits  CBC - Abnormal; Notable for the following components:   WBC 2.5 (*)    RBC 3.40 (*)    Hemoglobin 12.2 (*)    HCT 36.5 (*)    MCV 107.4 (*)    MCH 35.9 (*)    Platelets 132 (*)    All other components within normal limits  D-DIMER, QUANTITATIVE (NOT AT [facility])  TROPONIN I (HIGH SENSITIVITY)  TROPONIN I (HIGH SENSITIVITY)  TROPONIN I (HIGH SENSITIVITY)  TROPONIN I (HIGH SENSITIVITY)    EKG EKG Interpretation  Date/Time:  Sunday September 18 2019 08:50:23 EST Ventricular Rate:  73 PR Interval:    QRS Duration: 90 QT Interval:  414 QTC Calculation: 456 R Axis:   88 Text Interpretation: Atrial flutter with variable A-V block Abnormal ECG No significant change was found Confirmed by Ezequiel Essex [phone]) on 09/18/2019 9:56:06 AM  Radiology Dg Chest 2 View  Result Date: 09/18/2019  CLINICAL DATA:  Short of breath and left-sided chest pain EXAM: CHEST - 2 VIEW COMPARISON:  08/12/2019 FINDINGS: Mild cardiac enlargement. Aortic atherosclerosis identified. No pleural effusion or edema. Postoperative change and scarring within the right midlung appears stable. No superimposed airspace consolidation. IMPRESSION: No acute cardiopulmonary abnormalities. Electronically Signed   By: Kerby Moors M.D.   On: 09/18/2019 10:05   Ct Angio Chest/abd/pel For Dissection W And/or Wo Contrast  Result Date: 09/18/2019 CLINICAL DATA:  Left chest pain for 3 days with shortness of breath. EXAM: CT ANGIOGRAPHY CHEST, ABDOMEN AND PELVIS TECHNIQUE: Multidetector CT imaging through the chest, abdomen and pelvis was performed using the standard protocol during bolus administration of intravenous contrast. Multiplanar reconstructed images and MIPs were obtained and reviewed to evaluate the vascular anatomy. CONTRAST:  19mL OMNIPAQUE IOHEXOL 350 MG/ML SOLN COMPARISON:  PET-CT dated 07/21/2018, and CT chest from 06/17/2019 FINDINGS: CTA CHEST FINDINGS Cardiovascular: Noncontrast CT images of the chest demonstrate no compelling findings of acute intramural hematoma in the aorta or branch vessels. Following contrast administration, we demonstrate no evidence of aortic dissection or major branch vessel dissection. Coronary, aortic arch, and branch vessel atherosclerotic vascular disease. Mild cardiomegaly. Although contrast timing was optimized for systemic arterial opacification, there is adequate contrast in the pulmonary arterial tree to indicate the no large or central pulmonary embolus is present. Mediastinum/Nodes: Small hypodense thyroid nodules are likely benign. Calcified AP window lymph node 1.1 cm in short axis on image 63/7, formerly the same. Stable calcified lower paratracheal lymph nodes. Subcarinal lymph node 1.5 cm in short axis on image 80/7, previously the same. Lungs/Pleura: Biapical  pleuroparenchymal scarring. Postoperative findings in the right upper lobe. Trace new right pleural effusion. Scattered sub solid pulmonary nodules are present. An index superior segment left lower lobe nodule measures 1.4 by 0.9 cm on image 56/8, formerly 1.4 by 0.9 cm, but with a slightly higher degree of density today, and less internal gas density. There is confluent subpleural airspace opacity anteriorly in the right upper lobe similar to prior. A sub solid right upper lobe nodule on image 73/8 measures 2.1 by 1.4 cm, previously 2.2 by 1.4 cm. There other faint areas of posterior subpleural nodularity or mild atelectasis primarily in both lower lobes. Additional sub solid nodules in both upper lobes. Musculoskeletal: Stable foci of sclerosis in the right fourth rib overlying the region of subpleural chronic airspace opacity. Stable small sclerotic lesion posteriorly in the T9 vertebral body. Stable lipoma the left serratus anterior muscle for example on image 81/7. Review of the MIP images confirms the above findings. CTA ABDOMEN AND PELVIS FINDINGS VASCULAR Aorta: Aortoiliac atherosclerotic vascular disease. No aneurysm or abdominal aortic dissection. Celiac: Widely patent with standard branching. SMA: Widely patent. Renals: Patent bilateral single renal arteries with mild associated atherosclerotic calcification. IMA: Patent. Inflow: Patent.  Atherosclerotic calcifications noted. Veins: Non-opacified due to early contrast phase, otherwise unremarkable. Review of the MIP images confirms the above findings. NON-VASCULAR Hepatobiliary: Hypodense left hepatic lobe lesions are probably cysts and are similar to the recent PET-CT. Nonspecific 1.8 by 1.9 by 1.5 cm focus of arterial phase enhancement in segment 2 of the liver on image 131/7, no previous significant hypermetabolic activity in this vicinity on prior PET-CT. Gallbladder unremarkable. No biliary dilatation. Pancreas: Unremarkable Spleen: Unremarkable  Adrenals/Urinary Tract: 2.5 by 2.8 cm right adrenal mass, precontrast density 24 Hounsfield units, stable size from 02/29/2016 hence probably benign. Prominent prostate gland indents  the bladder base. Small hypodense lesion in the left kidney upper pole is technically too small to characterize although statistically likely to be benign. Bilateral renal peripelvic cysts. Stomach/Bowel: Unremarkable Lymphatic: No pathologic adenopathy identified. Reproductive: Moderate prostatomegaly. Other: No supplemental non-categorized findings. Musculoskeletal: Bridging spurring of both sacroiliac joints. Chronic superior endplate compression fracture at L1. Mild right foraminal stenosis at L4-5 due to spurring. Incidental lipoma deep to the left gluteus maximus muscle, this mildly abuts the left sciatic nerve is shown on image 330/7. Review of the MIP images confirms the above findings. IMPRESSION: 1. No acute vascular findings. 2. Trace new right pleural effusion, cause uncertain. 3. Similar sub solid nodules and sub solid right subpleural airspace opacity. In this patient with history of lymphoma, the possibility of atypical pulmonary involvement is not readily excluded. Surveillance imaging of the lung lesions is recommended. 4. Nonspecific 1.8 cm focus of arterial phase enhancement in segment 2 of the liver. There is no prior accentuated metabolic activity in this vicinity on prior PET-CT of 07/21/2018. Possibilities for further workup of this lesion include hepatic protocol MRI with and without contrast, or surveillance on follow up imaging related to the patient's lymphoma. 5. Chronically stable right adrenal mass, likely benign. 6. Other imaging findings of potential clinical significance: Aortic Atherosclerosis (ICD10-I70.0). Coronary atherosclerosis. Mild cardiomegaly. Calcified mediastinal adenopathy, stable. Postoperative findings in the right upper lobe. Stable small well-defined foci of sclerosis in the right fourth  rib. Chronic superior endplate compression at L1. Right foraminal stenosis at L4-5 due to spurring. Lipoma deep to the left gluteus maximus muscle, stable, this mildly abuts the left sciatic nerve. Prostatomegaly. Electronically Signed   By: Van Clines M.D.   On: 09/18/2019 14:24    Procedures Procedures (including critical care time)  Medications Ordered in ED Medications  sodium chloride flush (NS) 0.9 % injection 3 mL (has no administration in time range)  aspirin chewable tablet 324 mg (324 mg Oral Given 09/18/19 1005)     Initial Impression / Assessment and Plan / ED Course  I have reviewed the triage vital signs and the nursing notes.  Pertinent labs & imaging results that were available during my care of the patient were reviewed by me and considered in my medical decision making (see chart for details).       Intermittent chest pain for the past 3 days.  Is somewhat reproducible.  EKG shows atrial fibrillation without acute ST changes.  Patient had a reassuring stress test in 2017.  Work-up is reassuring.  Negative troponin, negative D-dimer.  Chest x-ray is stable.  Cardiology records reviewed.  Patient is a difficult historian not describe his chest pain very well. He states he been having shortness of breath since he had lung surgery 2 years ago but that is unchanged.  Over the past 3 days has had intermittent central chest pain that comes and goes but cannot really describe it much more than that.  Both he and his wife would prefer him to go home.  Chest pain has resolved. Troponins remain negative.  CTA shows no acute vascular pathology. D-dimer negative. CT with multiple findings including lung nodules and abnormality. Has seen Dr. Irene Limbo of oncology and patient was told lesions didn't need any intervention, just monitoring.   D/w dr. Bronson Ing of cardiology. Will arrange for office followup and possible stress test this week.  Patient still wants to go home.  Remains chest pain free. Troponins negative  Return precautions discussed including exertional chest pain SOB,  nausea, vomiting. Followup with oncologist and pulmonologist regarding CT findings.   Final Clinical Impressions(s) / ED Diagnoses   Final diagnoses:  Nonspecific chest pain    ED Discharge Orders    None       Jasten Guyette, Annie Main, MD 09/18/19 1940

## 2019-09-18 NOTE — Discharge Instructions (Addendum)
Cardiology office will call you for a follow-up appointment this week for a stress test.  Call them if you do not hear from them by Tuesday.  As we discussed your CT scan has several findings that are concerning. The CT report is copied below.  You should follow-up with your oncologist Dr. Irene Limbo for further evaluation of the lesions of your lungs as well as the spot in your liver which needs an MRI.  Return to the ED if you develop chest pain that is exertional, associated shortness of breath, nausea, vomiting, sweating, other concerns  IMPRESSION: 1. No acute vascular findings. 2. Trace new right pleural effusion, cause uncertain. 3. Similar sub solid nodules and sub solid right subpleural airspace opacity. In this patient with history of lymphoma, the possibility of atypical pulmonary involvement is not readily excluded. Surveillance imaging of the lung lesions is recommended. 4. Nonspecific 1.8 cm focus of arterial phase enhancement in segment 2 of the liver. There is no prior accentuated metabolic activity in this vicinity on prior PET-CT of 07/21/2018. Possibilities for further workup of this lesion include hepatic protocol MRI with and without contrast, or surveillance on follow up imaging related to the patient's lymphoma.

## 2019-09-18 NOTE — ED Notes (Signed)
Patient transported to CT 

## 2019-09-18 NOTE — ED Notes (Signed)
Ambulated to BR.

## 2019-09-18 NOTE — ED Triage Notes (Signed)
C/o constant L sided chest pain x 3 days with SOB.  Denies any other associated symptoms.

## 2019-09-18 NOTE — ED Notes (Addendum)
States he had surgery on right lung 2 years ago and and been sob since. Sob getting worse. States he had pedal edema and was pull on a medication and now he doesn't have any problems with swelling.states the right side of his chest is sore to palpation, states he lifted up a mower yest.

## 2019-09-23 ENCOUNTER — Telehealth: Payer: Self-pay | Admitting: Pulmonary Disease

## 2019-09-23 NOTE — Telephone Encounter (Signed)
Patient will need visit with -virtual/telephone visit to discuss as this was a sick visit to ER , study was done by EDP and CT showed abnormalities that will need review by provider with patient   If AO not here with APP with opening or can set up with Dr. Hermina Staggers with next available which ever they prefer

## 2019-09-23 NOTE — Telephone Encounter (Signed)
Call returned to patient wife Levander Campion (dpr), she states her husband was recently seen in the ED and he had a CT done. She is wanting the results. I made her aware AO is not in clinic today so I would have to get the message sent to APP of day. Voiced understanding.   Wife would like to pick up a copy of CT.   TP Please advise of CT results in absence of AO. Thanks.

## 2019-09-23 NOTE — Telephone Encounter (Signed)
atc pt's spouse, line rang several times then asked for a remote access code. Will await call back.

## 2019-09-27 NOTE — Telephone Encounter (Signed)
LM with family member to have him call the office.

## 2019-10-13 ENCOUNTER — Ambulatory Visit: Payer: Medicare Other | Admitting: Cardiovascular Disease

## 2019-10-13 ENCOUNTER — Encounter: Payer: Self-pay | Admitting: Cardiovascular Disease

## 2019-10-13 ENCOUNTER — Other Ambulatory Visit: Payer: Self-pay

## 2019-10-13 VITALS — BP 138/50 | HR 65 | Temp 98.0°F | Ht 70.0 in | Wt 188.0 lb

## 2019-10-13 DIAGNOSIS — I2510 Atherosclerotic heart disease of native coronary artery without angina pectoris: Secondary | ICD-10-CM

## 2019-10-13 DIAGNOSIS — I4821 Permanent atrial fibrillation: Secondary | ICD-10-CM | POA: Diagnosis not present

## 2019-10-13 DIAGNOSIS — C8599 Non-Hodgkin lymphoma, unspecified, extranodal and solid organ sites: Secondary | ICD-10-CM

## 2019-10-13 DIAGNOSIS — Z7901 Long term (current) use of anticoagulants: Secondary | ICD-10-CM | POA: Diagnosis not present

## 2019-10-13 DIAGNOSIS — G44329 Chronic post-traumatic headache, not intractable: Secondary | ICD-10-CM

## 2019-10-13 DIAGNOSIS — E7800 Pure hypercholesterolemia, unspecified: Secondary | ICD-10-CM

## 2019-10-13 DIAGNOSIS — I10 Essential (primary) hypertension: Secondary | ICD-10-CM

## 2019-10-13 NOTE — Patient Instructions (Signed)
Medication Instructions:  No changes *If you need a refill on your cardiac medications before your next appointment, please call your pharmacy*  Lab Work: None ordered If you have labs (blood work) drawn today and your tests are completely normal, you will receive your results only by: [name] MyChart Message (if you have MyChart) OR . A paper copy in the mail If you have any lab test that is abnormal or we need to change your treatment, we will call you to review the results.  Testing/Procedures: Non-Cardiac CT scanning, (CAT scanning), is a noninvasive, special x-ray that produces cross-sectional images of the body using x-rays and a computer. CT scans help physicians diagnose and treat medical conditions. For some CT exams, a contrast material is used to enhance visibility in the area of the body being studied. CT scans provide greater clarity and reveal more details than regular x-ray exams.   Follow-Up: At [facility], you and your health needs are our priority.  As part of our continuing mission to provide you with exceptional heart care, we have created designated Provider Care Teams.  These Care Teams include your primary Cardiologist (physician) and Advanced Practice Providers (APPs -  Physician Assistants and Nurse Practitioners) who all work together to provide you with the care you need, when you need it.  Your next appointment:   12 month(s)  The format for your next appointment:   In Person  Provider:   Sanda Klein, MD

## 2019-10-13 NOTE — Progress Notes (Signed)
Patient ID: LEORN FAIST, male   DOB: July 10, 1941, 78 y.o.   MRN: [id number]     Cardiology Office Note    Date:  10/13/2019   ID:  MARQUINN DIETEL, DOB 08/26/1941, MRN [id number]  PCP:  Antony Contras, MD  Cardiologist:   Sanda Klein, MD   Chief Complaint  Patient presents with  . Leg Pain    History of Present Illness:  MARVELOUS GARVEN is a 78 y.o. male with longstanding persistent atrial fibrillation, numerous coronary risk factors, but a benign 2017 workup for coronary artery calcification seen on chest CT, performed for lung mass, returning for follow-up (repeat biopsy of the lung mass shows that he has a MALT lymphoma, currently being treated with observation).  His major complaints are related to leg pain. His legs hurt at rest, worse if he walks longer distances. He recently performed bilateral lower extremity ultrasonography but did not show evidence of arterial insufficiency.  He fell backwards about 3 weeks ago and hit his lower back in his head. He has been having intermittent headaches and right-sided earache since that time. He did not seek medical attention. He is on anticoagulation with Xarelto. He did not have overt bleeding.  His atrial fibrillation remains spontaneously rate controlled and asymptomatic.  He has a mildly dilated left atrium and mild LVH, but otherwise no evidence of structural heart disease.  He does not have a history of stroke, TIA or other embolic events.  He is tolerating anticoagulation with Xarelto without any bleeding complications.  He is compliant with statin therapy and his blood pressure is normal on his current medical regimen. We have discussed the option for cardioversion, but he prefers conservative management.   Past Medical History:  Diagnosis Date  . Cervical spondylosis without myelopathy 12/01/2013  . Diabetes mellitus    borderline  . Enlarged prostate   . Frequency of urination   . Hearing deficit   . Heart murmur   .  History of kidney stones    removed with Lithotripsy  . HOH (hard of hearing)    right ear  . Hyperlipemia   . Hypertension   . Lumbago 12/01/2013  . Pneumonia 2016  . Polio    right leg smaller than left  . Shortness of breath dyspnea    with exertion    Past Surgical History:  Procedure Laterality Date  . CATARACT EXTRACTION Bilateral 2014  . LUNG BIOPSY Right 2018   March  . VIDEO ASSISTED THORACOSCOPY (VATS)/WEDGE RESECTION Right 04/12/2018   Procedure: VIDEO ASSISTED THORACOSCOPY (VATS), WEDGE RESECTION RIGHT MIDDLE LOBE, PLACEMENT OF ON Q CATHETER;  Surgeon: [name], MD;  Location: [facility];  Service: Thoracic;  Laterality: Right;  [name] VIDEO BRONCHOSCOPY N/A 04/12/2018   Procedure: VIDEO BRONCHOSCOPY;  Surgeon: [name], MD;  Location: [facility];  Service: Thoracic;  Laterality: N/A;  . VIDEO BRONCHOSCOPY WITH ENDOBRONCHIAL NAVIGATION N/A 01/25/2016   Procedure: VIDEO BRONCHOSCOPY WITH ENDOBRONCHIAL NAVIGATION;  Surgeon: [name], MD;  Location: [facility];  Service: Thoracic;  Laterality: N/A;  . VIDEO BRONCHOSCOPY WITH ENDOBRONCHIAL ULTRASOUND N/A 01/25/2016   Procedure: VIDEO BRONCHOSCOPY WITH ENDOBRONCHIAL ULTRASOUND;  Surgeon: [name], MD;  Location: [facility];  Service: Thoracic;  Laterality: N/A;    Outpatient Medications Prior to Visit  Medication Sig Dispense Refill  . alfuzosin (UROXATRAL) 10 MG 24 hr tablet Take 10 mg by mouth at bedtime.     [name] amLODipine (NORVASC) 10 MG tablet Take 10  mg by mouth daily.    . Ascorbic Acid (SUPER C COMPLEX PO) Take 1 capsule by mouth every evening. Super C 1000    . Calcium Carb-Cholecalciferol (CALCIUM 600+D3 PO) Take 1 tablet by mouth every evening.    . Cyanocobalamin (VITAMIN B-12) 5000 MCG SUBL Place 1 tablet under the tongue every evening.     . Ferrous Sulfate (IRON) 28 MG TABS Take 28 mg by mouth every evening.    . finasteride (PROSCAR) 5 MG tablet Take 5 mg by mouth at bedtime.     .  hydrochlorothiazide (HYDRODIURIL) 12.5 MG tablet Take 12.5 mg by mouth daily.    . Omega-3 Fatty Acids (FISH OIL) 1200 MG CAPS Take 1,200 mg by mouth every evening.    Vladimir Faster Glycol-Propyl Glycol (SYSTANE) 0.4-0.3 % SOLN Place 1 drop into both eyes 2 (two) times daily as needed (for dry/irritated eyes.).    [name] rivaroxaban (XARELTO) 20 MG TABS tablet TAKE 1 TABLET (20 MG TOTAL) BY MOUTH DAILY WITH SUPPER. (Patient taking differently: Take 20 mg by mouth daily with supper. ) 90 tablet 3  . simvastatin (ZOCOR) 20 MG tablet Take 20 mg by mouth every evening.    . traMADol (ULTRAM) 50 MG tablet Take 1 tablet (50 mg total) by mouth every 6 (six) hours as needed (pain). (Patient taking differently: Take 25 mg by mouth 2 (two) times daily. ) 28 tablet 0  . Vitamin Mixture (SELENIUM-VITAMIN E) 50-400 MCG-UNIT CAPS Take 1 capsule by mouth every evening.     No facility-administered medications prior to visit.     Allergies:   Levaquin [levofloxacin in d5w] and Penicillins   Social History   Socioeconomic History  . Marital status: Married    Spouse name: Not on file  . Number of children: 2  . Years of education: Not on file  . Highest education level: Not on file  Occupational History  . Not on file  Tobacco Use  . Smoking status: Former Smoker    Packs/day: 0.50    Years: 9.00    Pack years: 4.50    Types: Cigarettes    Quit date: 12/02/1967    Years since quitting: 51.8  . Smokeless tobacco: Former Systems developer    Types: Chew    Quit date: 2000  Substance and Sexual Activity  . Alcohol use: No  . Drug use: No  . Sexual activity: Never  Other Topics Concern  . Not on file  Social History Narrative   Patient is right handed.   Patient drinks 1 cup caffeine daily.      Pt has been married for 54 years and has 2 children, 4 grandchildren, and 3 great-grandchildren. Lives with everyone except great-grandchildren. He does not clean house, but can shop, do yard work, and drive. He is a  retired Building control surveyor. He finished school through 11th grade.         Epworth Sleepiness Scale Score:  8      --I have HTN   --I seem to be losing my sex drive   --I wake up to urinate frequently at night   --I awake feeling not rested   --I have borderline diabetes   Social Determinants of Health   Financial Resource Strain:   . Difficulty of Paying Living Expenses: Not on file  Food Insecurity:   . Worried About Charity fundraiser in the Last Year: Not on file  . Ran Out of Food in the Last Year: Not on  file  Transportation Needs:   . Film/video editor (Medical): Not on file  . Lack of Transportation (Non-Medical): Not on file  Physical Activity:   . Days of Exercise per Week: Not on file  . Minutes of Exercise per Session: Not on file  Stress:   . Feeling of Stress : Not on file  Social Connections:   . Frequency of Communication with Friends and Family: Not on file  . Frequency of Social Gatherings with Friends and Family: Not on file  . Attends Religious Services: Not on file  . Active Member of Clubs or Organizations: Not on file  . Attends Archivist Meetings: Not on file  . Marital Status: Not on file     Family History:  The patient's family history includes Cancer in his mother; Cancer - Lung in his father; Diabetes in his brother; Heart attack in his brother.   ROS:   Please see the history of present illness.    ROS All other systems are reviewed and are negative.  PHYSICAL EXAM:   VS:  BP (!) 138/50 (BP Location: Left Arm, Patient Position: Sitting, Cuff Size: Normal)   Pulse 65   Temp 98 F (36.7 C)   Ht 5\' 10"  (1.778 m)   Wt 188 lb (85.3 kg)   BMI 26.98 kg/m     General: Alert, oriented x3, no distress Head: no evidence of trauma, PERRL, EOMI, no exophtalmos or lid lag, no myxedema, no xanthelasma; normal ears, nose and oropharynx Neck: normal jugular venous pulsations and no hepatojugular reflux; brisk carotid pulses without delay and no  carotid bruits Chest: clear to auscultation, no signs of consolidation by percussion or palpation, normal fremitus, symmetrical and full respiratory excursions Cardiovascular: normal position and quality of the apical impulse, irregular rhythm, normal first and second heart sounds, no murmurs, rubs or gallops Abdomen: no tenderness or distention, no masses by palpation, no abnormal pulsatility or arterial bruits, normal bowel sounds, no hepatosplenomegaly Extremities: no clubbing, cyanosis or edema; 2+ radial, ulnar and brachial pulses bilaterally; 2+ right femoral, posterior tibial and dorsalis pedis pulses; 2+ left femoral, posterior tibial and dorsalis pedis pulses; no subclavian or femoral bruits Neurological: grossly nonfocal, but extremely hard of hearing Psych: Normal mood and affect   Wt Readings from Last 3 Encounters:  10/13/19 188 lb (85.3 kg)  06/15/19 193 lb (87.5 kg)  05/12/19 189 lb 9.6 oz (86 kg)      Studies/Labs Reviewed:  09 26 2020 lower extremity arterial Dopplers  without evidence of obstruction EKG:  EKG is ordered today. It shows atrial fibrillation otherwise normal tracing Recent Labs: 03/18/2019: ALT 31 09/18/2019: BUN 18; Creatinine, Ser 1.21; Hemoglobin 12.2; Platelets 132; Potassium 4.0; Sodium 139   Lipid Panel Lipid Panel  No results found for: CHOL, TRIG, HDL, CHOLHDL, VLDL, LDLCALC, LDLDIRECT, LABVLDL 09/27/2019  total cholesterol 125, HDL 48, LDL 71, triglycerides 33 Hemoglobin A1c 6.3%, hemoglobin 12.2, creatinine 1.12, potassium 4.2, TSH 1.33   ASSESSMENT:    1. Permanent atrial fibrillation ([facility])   2. Long term current use of anticoagulant   3. Coronary artery calcification seen on CT scan   4. Lymphoma involving lung ([place])   5. Hypercholesterolemia   6. Chronic post-traumatic headache, not intractable   7. Essential hypertension      PLAN:  In order of problems listed above:   1. AFib: He has longstanding persistent atrial  fibrillation likely a permanent arrhythmia. It is spontaneously rate controlled suggesting that he  has underlying conduction system disease.  Cardioversion was offered but declined by the patient.  He understands the need to continue anticoagulation to prevent stroke.   CHADSVasc at least 3 (age 89, HTN, +/-CAD).   2. Anticoagulation: I am worried by the fact that he has had a little headache for 3 weeks following a closed head injury. We will schedule for CT of the head today. No overt neurological abnormalities are seen other than the fact that he is very hard of hearing. 3. Coronary calcification: He has never had angina pectoris. Normal Lexiscan Myoview in March 2017.  Remains asymptomatic.  Focus is on risk factors, which are well controlled. 4. 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  Plan for follow-up visit with his oncologist later this week. According to his last note, no treatment will be necessary if repeat PET/CT scan does not show residual disease 5. HTN: Adequate control 6. HLP: On statin. Recent LDL 71  Medication Adjustments/Labs and Tests Ordered: Current medicines are reviewed at length with the patient today.  Concerns regarding medicines are outlined above.  Medication changes, Labs and Tests ordered today are listed in the Patient Instructions below. Patient Instructions  Medication Instructions:  No changes *If you need a refill on your cardiac medications before your next appointment, please call your pharmacy*  Lab Work: None ordered If you have labs (blood work) drawn today and your tests are completely normal, you will receive your results only by: [name] MyChart Message (if you have MyChart) OR . A paper copy in the mail If you have any lab test that is abnormal or we need to change your treatment, we will call you to review the results.  Testing/Procedures: Non-Cardiac CT scanning, (CAT scanning), is a noninvasive, special  x-ray that produces cross-sectional images of the body using x-rays and a computer. CT scans help physicians diagnose and treat medical conditions. For some CT exams, a contrast material is used to enhance visibility in the area of the body being studied. CT scans provide greater clarity and reveal more details than regular x-ray exams.   Follow-Up: At [facility], you and your health needs are our priority.  As part of our continuing mission to provide you with exceptional heart care, we have created designated Provider Care Teams.  These Care Teams include your primary Cardiologist (physician) and Advanced Practice Providers (APPs -  Physician Assistants and Nurse Practitioners) who all work together to provide you with the care you need, when you need it.  Your next appointment:   12 month(s)  The format for your next appointment:   In Person  Provider:   Sanda Klein, MD        Signed, Sanda Klein, MD  10/13/2019 12:42 PM    [address], [place], [place]  [address] Phone: [phone]; Fax: [phone]

## 2019-10-17 ENCOUNTER — Other Ambulatory Visit (INDEPENDENT_AMBULATORY_CARE_PROVIDER_SITE_OTHER): Payer: Medicare Other

## 2019-10-17 DIAGNOSIS — I10 Essential (primary) hypertension: Secondary | ICD-10-CM | POA: Diagnosis not present

## 2019-10-17 DIAGNOSIS — I2510 Atherosclerotic heart disease of native coronary artery without angina pectoris: Secondary | ICD-10-CM

## 2019-10-17 DIAGNOSIS — I4821 Permanent atrial fibrillation: Secondary | ICD-10-CM

## 2019-10-17 DIAGNOSIS — Z7901 Long term (current) use of anticoagulants: Secondary | ICD-10-CM | POA: Diagnosis not present

## 2019-10-21 ENCOUNTER — Ambulatory Visit
Admission: RE | Admit: 2019-10-21 | Discharge: 2019-10-21 | Disposition: A | Discharge status: Home / Self Care | Payer: Medicare Other | Source: Ambulatory Visit | Attending: Cardiovascular Disease | Admitting: Cardiovascular Disease

## 2019-10-25 ENCOUNTER — Telehealth: Payer: Self-pay | Admitting: Hematology

## 2019-10-25 NOTE — Telephone Encounter (Signed)
Returned patient' phone call regarding rescheduling an appointment, per patient's request appointment has moved to 01/26.

## 2019-10-26 ENCOUNTER — Inpatient Hospital Stay: Payer: Medicare Other

## 2019-10-26 ENCOUNTER — Inpatient Hospital Stay: Payer: Medicare Other | Admitting: Hematology

## 2019-11-11 IMAGING — CT NM PET TUM IMG RESTAG (PS) SKULL BASE T - THIGH
1 of 6 series · 2 of 25 positions shown · non-contrast
Comparison: CT 02/18/2018, PET-CT 01/01/2016

CLINICAL DATA: Subsequent treatment strategy for lymphoma.
Non-Hodgkin's lymphoma. Marginal zone lymphoma.

EXAM:
NUCLEAR MEDICINE PET SKULL BASE TO THIGH
TECHNIQUE: 9.4 mCi F-18 FDG was injected intravenously. Full-ring PET imaging
was performed from the skull base to thigh after the radiotracer. CT
data was obtained and used for attenuation correction and anatomic
localization.
Fasting blood glucose: 113 mg/dl

[Series 4: ct sk_thigh 5.0 b31f · axial · 5.0mm · 0.98mm/px · z∈[-404,-244]mm · 2 of 236 slices shown]
[im 196/236  brain]
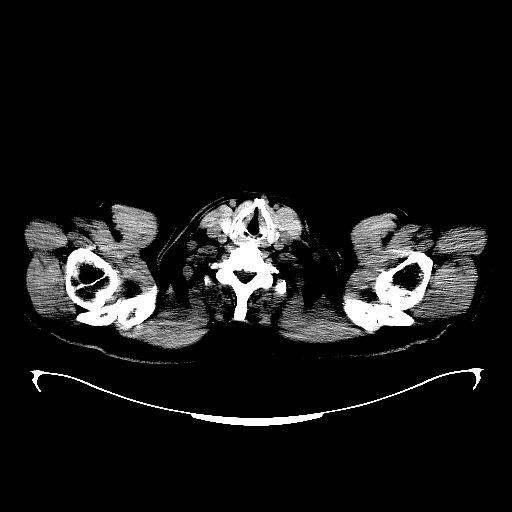
[im 236/236  brain]
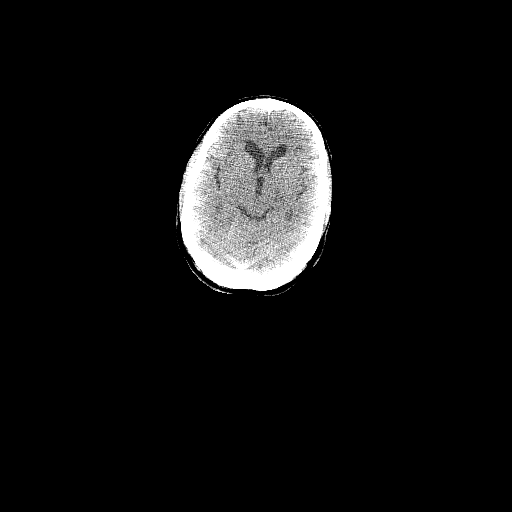

[2 of 25 positions shown; findings below may reference images not displayed]

FINDINGS: Mediastinal blood pool activity: SUV max

NECK: No hypermetabolic lymph nodes in the neck.

Incidental CT findings: none

CHEST: Interval resection of the large RIGHT middle lobe nodule.

Interval increase in density of the peripheral subpleural thickening
in the RIGHT upper lobe (image 74/4) with SUV max equal 5.5. Trace
metabolic activity on comparison PET-CT scan with SUV max equal 2.3.

Mild metabolic activity associated with LEFT and RIGHT hilar nodes
and prevascular node with SUV max equal 4.0 to 5.5. This activity is
similar comparison exam.

Incidental CT findings: none

ABDOMEN/PELVIS: No abnormal hypermetabolic activity within the
liver, pancreas, adrenal glands, or spleen. No hypermetabolic lymph
nodes in the abdomen or pelvis.

Incidental CT findings: Stable enlargement of the RIGHT adrenal
gland with mild metabolic activity. Benign cysts in liver.

Filling defect within the bladder (image 181/4 likely represents
nodular extensive prostate gland.

SKELETON:

Incidental CT findings: none
IMPRESSION: 1. Interval resection of the RIGHT middle lobe pulmonary nodule.
2. Increase thickening of subpleural tissue in the RIGHT upper lobe
with increased metabolic activity. In patient with lymphoma history,
cannot exclude atypical thoracic lymphoma.
3. No change in small hypermetabolic mediastinal lymph nodes which
are favored reactive.
4. Prostatomegaly with nodularity.
5. Stable RIGHT adrenal mass.  Favor benign.
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
## 2019-11-29 ENCOUNTER — Other Ambulatory Visit: Payer: Self-pay

## 2019-11-29 ENCOUNTER — Inpatient Hospital Stay: Payer: Medicare PPO | Admitting: Hematology

## 2019-11-29 ENCOUNTER — Inpatient Hospital Stay: Payer: Medicare PPO | Attending: Hematology

## 2019-11-29 VITALS — BP 141/61 | HR 77 | Temp 97.7°F | Resp 18 | Ht 70.0 in | Wt 188.8 lb

## 2019-11-29 DIAGNOSIS — Z79899 Other long term (current) drug therapy: Secondary | ICD-10-CM | POA: Diagnosis not present

## 2019-11-29 DIAGNOSIS — I10 Essential (primary) hypertension: Secondary | ICD-10-CM | POA: Insufficient documentation

## 2019-11-29 DIAGNOSIS — C8582 Other specified types of non-Hodgkin lymphoma, intrathoracic lymph nodes: Secondary | ICD-10-CM

## 2019-11-29 DIAGNOSIS — J209 Acute bronchitis, unspecified: Secondary | ICD-10-CM | POA: Insufficient documentation

## 2019-11-29 DIAGNOSIS — I700 Atherosclerosis of aorta: Secondary | ICD-10-CM | POA: Insufficient documentation

## 2019-11-29 DIAGNOSIS — Z801 Family history of malignant neoplasm of trachea, bronchus and lung: Secondary | ICD-10-CM | POA: Diagnosis not present

## 2019-11-29 DIAGNOSIS — Z87891 Personal history of nicotine dependence: Secondary | ICD-10-CM | POA: Insufficient documentation

## 2019-11-29 DIAGNOSIS — Z833 Family history of diabetes mellitus: Secondary | ICD-10-CM | POA: Diagnosis not present

## 2019-11-29 DIAGNOSIS — M48061 Spinal stenosis, lumbar region without neurogenic claudication: Secondary | ICD-10-CM | POA: Insufficient documentation

## 2019-11-29 DIAGNOSIS — I4891 Unspecified atrial fibrillation: Secondary | ICD-10-CM | POA: Insufficient documentation

## 2019-11-29 DIAGNOSIS — I517 Cardiomegaly: Secondary | ICD-10-CM | POA: Insufficient documentation

## 2019-11-29 DIAGNOSIS — R0602 Shortness of breath: Secondary | ICD-10-CM | POA: Insufficient documentation

## 2019-11-29 DIAGNOSIS — Z8249 Family history of ischemic heart disease and other diseases of the circulatory system: Secondary | ICD-10-CM | POA: Insufficient documentation

## 2019-11-29 DIAGNOSIS — K7689 Other specified diseases of liver: Secondary | ICD-10-CM

## 2019-11-29 DIAGNOSIS — R079 Chest pain, unspecified: Secondary | ICD-10-CM | POA: Insufficient documentation

## 2019-11-29 DIAGNOSIS — Z800 Family history of malignant neoplasm of digestive organs: Secondary | ICD-10-CM | POA: Insufficient documentation

## 2019-11-29 DIAGNOSIS — R519 Headache, unspecified: Secondary | ICD-10-CM | POA: Diagnosis not present

## 2019-11-29 DIAGNOSIS — C884 Extranodal marginal zone B-cell lymphoma of mucosa-associated lymphoid tissue [MALT-lymphoma]: Secondary | ICD-10-CM | POA: Insufficient documentation

## 2019-11-29 LAB — CMP (CANCER CENTER ONLY)
ALT: 29 U/L (ref 0–44)
AST: 22 U/L (ref 15–41)
Albumin: 4.4 g/dL (ref 3.5–5.0)
Alkaline Phosphatase: 94 U/L (ref 38–126)
Anion gap: 9 (ref 5–15)
BUN: 15 mg/dL (ref 8–23)
CO2: 28 mmol/L (ref 22–32)
Calcium: 8.9 mg/dL (ref 8.9–10.3)
Chloride: 104 mmol/L (ref 98–111)
Creatinine: 1.07 mg/dL (ref 0.61–1.24)
GFR, Est AFR Am: >60 mL/min (ref >60)
GFR, Estimated: >60 mL/min (ref >60)
Glucose, Bld: 131 mg/dL — ABNORMAL HIGH (ref 70–99)
Potassium: 4 mmol/L (ref 3.5–5.1)
Sodium: 141 mmol/L (ref 135–145)
Total Bilirubin: 0.8 mg/dL (ref 0.3–1.2)
Total Protein: 7 g/dL (ref 6.5–8.1)

## 2019-11-29 LAB — CBC WITH DIFFERENTIAL/PLATELET
Abs Immature Granulocytes: 0 10*3/uL (ref 0.00–0.07)
Basophils Absolute: 0 10*3/uL (ref 0.0–0.1)
Basophils Relative: 0 %
Eosinophils Absolute: 0.1 10*3/uL (ref 0.0–0.5)
Eosinophils Relative: 2 %
HCT: 34.7 % — ABNORMAL LOW (ref 39.0–52.0)
Hemoglobin: 11.8 g/dL — ABNORMAL LOW (ref 13.0–17.0)
Immature Granulocytes: 0 %
Lymphocytes Relative: 15 %
Lymphs Abs: 0.4 10*3/uL — ABNORMAL LOW (ref 0.7–4.0)
MCH: 36.9 pg — ABNORMAL HIGH (ref 26.0–34.0)
MCHC: 34 g/dL (ref 30.0–36.0)
MCV: 108.4 fL — ABNORMAL HIGH (ref 80.0–100.0)
Monocytes Absolute: 0.1 10*3/uL (ref 0.1–1.0)
Monocytes Relative: 4 %
Neutro Abs: 1.9 10*3/uL (ref 1.7–7.7)
Neutrophils Relative %: 79 %
Platelets: 115 10*3/uL — ABNORMAL LOW (ref 150–400)
RBC: 3.2 MIL/uL — ABNORMAL LOW (ref 4.22–5.81)
RDW: 13.4 % (ref 11.5–15.5)
WBC: 2.4 10*3/uL — ABNORMAL LOW (ref 4.0–10.5)
nRBC: 0 % (ref 0.0–0.2)

## 2019-11-29 LAB — LACTATE DEHYDROGENASE: LDH: 163 U/L (ref 98–192)

## 2019-12-02 ENCOUNTER — Telehealth: Payer: Self-pay | Admitting: Hematology

## 2019-12-02 NOTE — Telephone Encounter (Signed)
Scheduled per los, spoke with patient's wife and she will notify patient.

## 2019-12-12 ENCOUNTER — Other Ambulatory Visit: Payer: Self-pay

## 2019-12-12 ENCOUNTER — Emergency Department (HOSPITAL_COMMUNITY): Payer: Medicare PPO

## 2019-12-12 ENCOUNTER — Emergency Department (HOSPITAL_COMMUNITY)
Admission: EM | Admit: 2019-12-12 | Discharge: 2019-12-12 | Disposition: A | Discharge status: Home / Self Care | Payer: Medicare PPO | Attending: Emergency Medicine | Admitting: Emergency Medicine

## 2019-12-12 ENCOUNTER — Encounter (HOSPITAL_COMMUNITY): Payer: Self-pay | Admitting: Emergency Medicine

## 2019-12-12 DIAGNOSIS — Y93I9 Activity, other involving external motion: Secondary | ICD-10-CM | POA: Diagnosis not present

## 2019-12-12 DIAGNOSIS — Z79899 Other long term (current) drug therapy: Secondary | ICD-10-CM | POA: Diagnosis not present

## 2019-12-12 DIAGNOSIS — S40012A Contusion of left shoulder, initial encounter: Secondary | ICD-10-CM | POA: Diagnosis not present

## 2019-12-12 DIAGNOSIS — E119 Type 2 diabetes mellitus without complications: Secondary | ICD-10-CM | POA: Diagnosis not present

## 2019-12-12 DIAGNOSIS — M25512 Pain in left shoulder: Secondary | ICD-10-CM | POA: Diagnosis not present

## 2019-12-12 DIAGNOSIS — S161XXA Strain of muscle, fascia and tendon at neck level, initial encounter: Secondary | ICD-10-CM | POA: Insufficient documentation

## 2019-12-12 DIAGNOSIS — Y92410 Unspecified street and highway as the place of occurrence of the external cause: Secondary | ICD-10-CM | POA: Insufficient documentation

## 2019-12-12 DIAGNOSIS — I10 Essential (primary) hypertension: Secondary | ICD-10-CM | POA: Diagnosis not present

## 2019-12-12 DIAGNOSIS — Z87891 Personal history of nicotine dependence: Secondary | ICD-10-CM | POA: Insufficient documentation

## 2019-12-12 DIAGNOSIS — R410 Disorientation, unspecified: Secondary | ICD-10-CM | POA: Diagnosis not present

## 2019-12-12 DIAGNOSIS — S4992XA Unspecified injury of left shoulder and upper arm, initial encounter: Secondary | ICD-10-CM | POA: Diagnosis not present

## 2019-12-12 DIAGNOSIS — S199XXA Unspecified injury of neck, initial encounter: Secondary | ICD-10-CM | POA: Diagnosis not present

## 2019-12-12 DIAGNOSIS — Y999 Unspecified external cause status: Secondary | ICD-10-CM | POA: Diagnosis not present

## 2019-12-12 DIAGNOSIS — S0990XA Unspecified injury of head, initial encounter: Secondary | ICD-10-CM | POA: Diagnosis not present

## 2019-12-12 MED ORDER — ACETAMINOPHEN 325 MG PO TABS
650.0000 mg | ORAL_TABLET | Freq: Once | ORAL | Status: DC
  Filled 2019-12-12: qty 2

## 2019-12-12 NOTE — Discharge Instructions (Addendum)
It was our pleasure to provide your ER care today - we hope that you feel better.  Take acetaminophen as need for pain.   Return to ER if worse, new symptoms, new or severe pain, severe headache, numbness/weakness, abdominal pain, or other concern.
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
## 2019-12-19 DIAGNOSIS — I10 Essential (primary) hypertension: Secondary | ICD-10-CM | POA: Diagnosis not present

## 2019-12-19 DIAGNOSIS — M25512 Pain in left shoulder: Secondary | ICD-10-CM | POA: Diagnosis not present

## 2019-12-19 DIAGNOSIS — M5412 Radiculopathy, cervical region: Secondary | ICD-10-CM | POA: Diagnosis not present

## 2019-12-27 DIAGNOSIS — M25512 Pain in left shoulder: Secondary | ICD-10-CM | POA: Diagnosis not present

## 2019-12-27 DIAGNOSIS — M5412 Radiculopathy, cervical region: Secondary | ICD-10-CM | POA: Diagnosis not present

## 2019-12-30 DIAGNOSIS — M25512 Pain in left shoulder: Secondary | ICD-10-CM | POA: Diagnosis not present

## 2019-12-30 DIAGNOSIS — M5412 Radiculopathy, cervical region: Secondary | ICD-10-CM | POA: Diagnosis not present

## 2020-01-03 DIAGNOSIS — M5412 Radiculopathy, cervical region: Secondary | ICD-10-CM | POA: Diagnosis not present

## 2020-01-03 DIAGNOSIS — M25512 Pain in left shoulder: Secondary | ICD-10-CM | POA: Diagnosis not present

## 2020-01-05 ENCOUNTER — Ambulatory Visit: Payer: Medicare PPO | Attending: Internal Medicine

## 2020-01-05 DIAGNOSIS — Z23 Encounter for immunization: Secondary | ICD-10-CM | POA: Insufficient documentation

## 2020-01-05 NOTE — Progress Notes (Signed)
   Covid-19 Vaccination Clinic  Name:  James Sweeney    MRN: [id number] DOB: October 11, 1941  01/05/2020  [name] was observed post Covid-19 immunization for 15 minutes without incident. He was provided with Vaccine Information Sheet and instruction to access the V-Safe system.   [name] was instructed to call 911 with any severe reactions post vaccine: [name] Difficulty breathing  . Swelling of face and throat  . A fast heartbeat  . A bad rash all over body  . Dizziness and weakness   Immunizations Administered    Name Date Dose VIS Date Route   Pfizer COVID-19 Vaccine 01/05/2020  9:25 AM 0.3 mL 10/14/2019 Intramuscular   Manufacturer: [place]   Lot: [id number]   [place]: [id number]

## 2020-01-06 DIAGNOSIS — M25512 Pain in left shoulder: Secondary | ICD-10-CM | POA: Diagnosis not present

## 2020-01-06 DIAGNOSIS — M5412 Radiculopathy, cervical region: Secondary | ICD-10-CM | POA: Diagnosis not present

## 2020-01-09 DIAGNOSIS — Z87442 Personal history of urinary calculi: Secondary | ICD-10-CM | POA: Diagnosis not present

## 2020-01-09 DIAGNOSIS — R3912 Poor urinary stream: Secondary | ICD-10-CM | POA: Diagnosis not present

## 2020-01-09 DIAGNOSIS — N401 Enlarged prostate with lower urinary tract symptoms: Secondary | ICD-10-CM | POA: Diagnosis not present

## 2020-01-10 DIAGNOSIS — M25512 Pain in left shoulder: Secondary | ICD-10-CM | POA: Diagnosis not present

## 2020-01-10 DIAGNOSIS — M5412 Radiculopathy, cervical region: Secondary | ICD-10-CM | POA: Diagnosis not present

## 2020-01-13 DIAGNOSIS — M5412 Radiculopathy, cervical region: Secondary | ICD-10-CM | POA: Diagnosis not present

## 2020-01-13 DIAGNOSIS — M25512 Pain in left shoulder: Secondary | ICD-10-CM | POA: Diagnosis not present

## 2020-01-17 DIAGNOSIS — M25512 Pain in left shoulder: Secondary | ICD-10-CM | POA: Diagnosis not present

## 2020-01-17 DIAGNOSIS — M5412 Radiculopathy, cervical region: Secondary | ICD-10-CM | POA: Diagnosis not present

## 2020-01-20 DIAGNOSIS — M5412 Radiculopathy, cervical region: Secondary | ICD-10-CM | POA: Diagnosis not present

## 2020-01-20 DIAGNOSIS — M25512 Pain in left shoulder: Secondary | ICD-10-CM | POA: Diagnosis not present

## 2020-02-01 ENCOUNTER — Ambulatory Visit: Payer: BC Managed Care – PPO | Attending: Internal Medicine

## 2020-02-01 DIAGNOSIS — Z23 Encounter for immunization: Secondary | ICD-10-CM

## 2020-02-01 NOTE — Progress Notes (Signed)
   Covid-19 Vaccination Clinic  Name:  James Sweeney    MRN: [id number] DOB: 19-Jun-1941  02/01/2020  [name] was observed post Covid-19 immunization for 15 minutes without incident. He was provided with Vaccine Information Sheet and instruction to access the V-Safe system.   [name] was instructed to call 911 with any severe reactions post vaccine: [name] Difficulty breathing  . Swelling of face and throat  . A fast heartbeat  . A bad rash all over body  . Dizziness and weakness   Immunizations Administered    Name Date Dose VIS Date Route   Pfizer COVID-19 Vaccine 02/01/2020  9:36 AM 0.3 mL 10/14/2019 Intramuscular   Manufacturer: [place]   Lot: [id number]   [place]: [id number]

## 2020-02-21 ENCOUNTER — Inpatient Hospital Stay: Payer: Medicare HMO | Attending: Hematology

## 2020-02-21 ENCOUNTER — Inpatient Hospital Stay (HOSPITAL_BASED_OUTPATIENT_CLINIC_OR_DEPARTMENT_OTHER): Payer: Medicare HMO | Admitting: Hematology

## 2020-02-21 ENCOUNTER — Other Ambulatory Visit: Payer: Self-pay

## 2020-02-21 VITALS — BP 128/67 | HR 66 | Temp 98.3°F | Resp 18 | Ht 70.0 in | Wt 185.8 lb

## 2020-02-21 DIAGNOSIS — Z800 Family history of malignant neoplasm of digestive organs: Secondary | ICD-10-CM | POA: Insufficient documentation

## 2020-02-21 DIAGNOSIS — I10 Essential (primary) hypertension: Secondary | ICD-10-CM | POA: Insufficient documentation

## 2020-02-21 DIAGNOSIS — J209 Acute bronchitis, unspecified: Secondary | ICD-10-CM | POA: Diagnosis not present

## 2020-02-21 DIAGNOSIS — C8582 Other specified types of non-Hodgkin lymphoma, intrathoracic lymph nodes: Secondary | ICD-10-CM

## 2020-02-21 DIAGNOSIS — Z8249 Family history of ischemic heart disease and other diseases of the circulatory system: Secondary | ICD-10-CM | POA: Diagnosis not present

## 2020-02-21 DIAGNOSIS — K7689 Other specified diseases of liver: Secondary | ICD-10-CM | POA: Diagnosis not present

## 2020-02-21 DIAGNOSIS — Z833 Family history of diabetes mellitus: Secondary | ICD-10-CM | POA: Insufficient documentation

## 2020-02-21 DIAGNOSIS — Z87891 Personal history of nicotine dependence: Secondary | ICD-10-CM | POA: Diagnosis not present

## 2020-02-21 DIAGNOSIS — M48061 Spinal stenosis, lumbar region without neurogenic claudication: Secondary | ICD-10-CM | POA: Diagnosis not present

## 2020-02-21 DIAGNOSIS — C884 Extranodal marginal zone B-cell lymphoma of mucosa-associated lymphoid tissue [MALT-lymphoma]: Secondary | ICD-10-CM | POA: Insufficient documentation

## 2020-02-21 DIAGNOSIS — Z79899 Other long term (current) drug therapy: Secondary | ICD-10-CM | POA: Insufficient documentation

## 2020-02-21 DIAGNOSIS — Z801 Family history of malignant neoplasm of trachea, bronchus and lung: Secondary | ICD-10-CM | POA: Diagnosis not present

## 2020-02-21 DIAGNOSIS — I517 Cardiomegaly: Secondary | ICD-10-CM | POA: Insufficient documentation

## 2020-02-21 DIAGNOSIS — I700 Atherosclerosis of aorta: Secondary | ICD-10-CM | POA: Diagnosis not present

## 2020-02-21 LAB — CBC WITH DIFFERENTIAL/PLATELET
Abs Immature Granulocytes: 0 10*3/uL (ref 0.00–0.07)
Basophils Absolute: 0 10*3/uL (ref 0.0–0.1)
Basophils Relative: 0 %
Eosinophils Absolute: 0.1 10*3/uL (ref 0.0–0.5)
Eosinophils Relative: 4 %
HCT: 35.6 % — ABNORMAL LOW (ref 39.0–52.0)
Hemoglobin: 12.1 g/dL — ABNORMAL LOW (ref 13.0–17.0)
Immature Granulocytes: 0 %
Lymphocytes Relative: 17 %
Lymphs Abs: 0.4 10*3/uL — ABNORMAL LOW (ref 0.7–4.0)
MCH: 36.9 pg — ABNORMAL HIGH (ref 26.0–34.0)
MCHC: 34 g/dL (ref 30.0–36.0)
MCV: 108.5 fL — ABNORMAL HIGH (ref 80.0–100.0)
Monocytes Absolute: 0.1 10*3/uL (ref 0.1–1.0)
Monocytes Relative: 4 %
Neutro Abs: 1.6 10*3/uL — ABNORMAL LOW (ref 1.7–7.7)
Neutrophils Relative %: 75 %
Platelets: 129 10*3/uL — ABNORMAL LOW (ref 150–400)
RBC: 3.28 MIL/uL — ABNORMAL LOW (ref 4.22–5.81)
RDW: 13.4 % (ref 11.5–15.5)
WBC: 2.2 10*3/uL — ABNORMAL LOW (ref 4.0–10.5)
nRBC: 0 % (ref 0.0–0.2)

## 2020-02-21 LAB — LACTATE DEHYDROGENASE: LDH: 173 U/L (ref 98–192)

## 2020-02-21 LAB — CMP (CANCER CENTER ONLY)
ALT: 34 U/L (ref 0–44)
AST: 23 U/L (ref 15–41)
Albumin: 4.2 g/dL (ref 3.5–5.0)
Alkaline Phosphatase: 92 U/L (ref 38–126)
Anion gap: 7 (ref 5–15)
BUN: 18 mg/dL (ref 8–23)
CO2: 29 mmol/L (ref 22–32)
Calcium: 9.2 mg/dL (ref 8.9–10.3)
Chloride: 103 mmol/L (ref 98–111)
Creatinine: 1.27 mg/dL — ABNORMAL HIGH (ref 0.61–1.24)
GFR, Est AFR Am: >60 mL/min (ref >60)
GFR, Estimated: 53 mL/min — ABNORMAL LOW (ref >60)
Glucose, Bld: 187 mg/dL — ABNORMAL HIGH (ref 70–99)
Potassium: 4.1 mmol/L (ref 3.5–5.1)
Sodium: 139 mmol/L (ref 135–145)
Total Bilirubin: 0.8 mg/dL (ref 0.3–1.2)
Total Protein: 7 g/dL (ref 6.5–8.1)
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
## 2020-02-23 ENCOUNTER — Telehealth: Payer: Self-pay | Admitting: Hematology

## 2020-02-23 NOTE — Telephone Encounter (Signed)
Scheduled per 04/21 los, spoke with patient's wife and he will be notified.

## 2020-02-27 ENCOUNTER — Ambulatory Visit (HOSPITAL_COMMUNITY): Admission: RE | Admit: 2020-02-27 | Payer: Medicare HMO | Source: Ambulatory Visit

## 2020-02-27 ENCOUNTER — Ambulatory Visit (HOSPITAL_COMMUNITY): Payer: Medicare HMO

## 2020-02-28 ENCOUNTER — Telehealth: Payer: Self-pay | Admitting: Hematology

## 2020-02-28 NOTE — Telephone Encounter (Signed)
R/a appt per 4/26 sch message - pt wife aware of appt change.

## 2020-03-03 ENCOUNTER — Emergency Department (HOSPITAL_COMMUNITY): Payer: Medicare HMO

## 2020-03-03 ENCOUNTER — Encounter (HOSPITAL_COMMUNITY): Payer: Self-pay | Admitting: Emergency Medicine

## 2020-03-03 ENCOUNTER — Other Ambulatory Visit: Payer: Self-pay

## 2020-03-03 ENCOUNTER — Emergency Department (HOSPITAL_COMMUNITY)
Admission: EM | Admit: 2020-03-03 | Discharge: 2020-03-03 | Disposition: A | Discharge status: Home / Self Care | Payer: Medicare HMO | Attending: Emergency Medicine | Admitting: Emergency Medicine

## 2020-03-03 DIAGNOSIS — M7731 Calcaneal spur, right foot: Secondary | ICD-10-CM | POA: Diagnosis not present

## 2020-03-03 DIAGNOSIS — M79671 Pain in right foot: Secondary | ICD-10-CM | POA: Insufficient documentation

## 2020-03-03 DIAGNOSIS — R2241 Localized swelling, mass and lump, right lower limb: Secondary | ICD-10-CM | POA: Insufficient documentation

## 2020-03-03 DIAGNOSIS — M19071 Primary osteoarthritis, right ankle and foot: Secondary | ICD-10-CM | POA: Diagnosis not present

## 2020-03-03 DIAGNOSIS — M7989 Other specified soft tissue disorders: Secondary | ICD-10-CM | POA: Diagnosis not present

## 2020-03-03 DIAGNOSIS — Z8612 Personal history of poliomyelitis: Secondary | ICD-10-CM | POA: Insufficient documentation

## 2020-03-03 NOTE — ED Triage Notes (Signed)
Pt reports over year to two years ago stepped on a nail and went through his boot. Reports that his PCP told him it was the dirty shoe that got in foot and not the nail.  Reports right foot pain and swelling for a month. Denies any new injuries. Reports there is a spot on bottom of his foot that is swollen. Reports swelling gets better when elevates his foot. Pain is worse with weight bearing.

## 2020-03-03 NOTE — ED Provider Notes (Signed)
[name]   CSN: [id number] Arrival date & time: 03/03/20  1701     History Chief Complaint  Patient presents with  . Foot Swelling    James Sweeney is a 79 y.o. male.  The history is provided by the patient.  Foot Pain This is a recurrent problem. The current episode started more than 1 week ago. The problem occurs every several days. The symptoms are aggravated by walking. Nothing relieves the symptoms. He has tried nothing for the symptoms. The treatment provided no relief.       Past Medical History:  Diagnosis Date  . Cervical spondylosis without myelopathy 12/01/2013  . Diabetes mellitus    borderline  . Enlarged prostate   . Frequency of urination   . Hearing deficit   . Heart murmur   . History of kidney stones    removed with Lithotripsy  . HOH (hard of hearing)    right ear  . Hyperlipemia   . Hypertension   . Lumbago 12/01/2013  . Pneumonia 2016  . Polio    right leg smaller than left  . Shortness of breath dyspnea    with exertion    Patient Active Problem List   Diagnosis Date Noted  . Renal insufficiency 04/19/2018  . Chronic anticoagulation 04/19/2018  . Lung mass 04/12/2018  . Permanent atrial fibrillation ([place]) 01/19/2018  . Pneumothorax 01/25/2016  . Pneumothorax after biopsy 01/25/2016  . Coronary artery calcification seen on CT scan 01/21/2016  . Murmur, cardiac 01/21/2016  . Hypercholesterolemia 01/21/2016  . Essential hypertension 01/09/2016  . BPH (benign prostatic hyperplasia) 01/09/2016  . Solitary pulmonary nodule 01/09/2016  . Thrombocytopenia ([place]) 01/09/2016  . Cervical spondylosis without myelopathy 12/01/2013  . Lumbago 12/01/2013  . Diabetes mellitus     Past Surgical History:  Procedure Laterality Date  . CATARACT EXTRACTION Bilateral 2014  . LUNG BIOPSY Right 2018   March  . VIDEO ASSISTED THORACOSCOPY (VATS)/WEDGE RESECTION Right 04/12/2018   Procedure: VIDEO  ASSISTED THORACOSCOPY (VATS), WEDGE RESECTION RIGHT MIDDLE LOBE, PLACEMENT OF ON Q CATHETER;  Surgeon: [name], MD;  Location: [facility];  Service: Thoracic;  Laterality: Right;  [name] VIDEO BRONCHOSCOPY N/A 04/12/2018   Procedure: VIDEO BRONCHOSCOPY;  Surgeon: [name], MD;  Location: [facility] OR;  Service: Thoracic;  Laterality: N/A;  . VIDEO BRONCHOSCOPY WITH ENDOBRONCHIAL NAVIGATION N/A 01/25/2016   Procedure: VIDEO BRONCHOSCOPY WITH ENDOBRONCHIAL NAVIGATION;  Surgeon: [name], MD;  Location: [facility];  Service: Thoracic;  Laterality: N/A;  . VIDEO BRONCHOSCOPY WITH ENDOBRONCHIAL ULTRASOUND N/A 01/25/2016   Procedure: VIDEO BRONCHOSCOPY WITH ENDOBRONCHIAL ULTRASOUND;  Surgeon: [name], MD;  Location: [facility] OR;  Service: Thoracic;  Laterality: N/A;       Family History  Problem Relation Age of Onset  . Cancer Mother        Stomach  . Cancer - Lung Father   . Heart attack Brother   . Diabetes Brother     Social History   Tobacco Use  . Smoking status: Former Smoker    Packs/day: 0.50    Years: 9.00    Pack years: 4.50    Types: Cigarettes    Quit date: 12/02/1967    Years since quitting: 52.2  . Smokeless tobacco: Former Systems developer    Types: Chew    Quit date: 2000  Substance Use Topics  . Alcohol use: No  . Drug use: No    Home Medications Prior to  Admission medications   Medication Sig Start Date End Date Taking? Authorizing Provider  alfuzosin (UROXATRAL) 10 MG 24 hr tablet Take 10 mg by mouth at bedtime.     [provider]  amLODipine (NORVASC) 10 MG tablet Take 10 mg by mouth daily.    [provider]  Ascorbic Acid (SUPER C COMPLEX PO) Take 1 capsule by mouth every evening. Super C 1000    [provider]  Calcium Carb-Cholecalciferol (CALCIUM 600+D3 PO) Take 1 tablet by mouth every evening.    [provider]  Cyanocobalamin (VITAMIN B-12) 5000 MCG SUBL Place 1 tablet under the tongue every evening.     [provider]  Ferrous Sulfate (IRON) 28 MG TABS Take 28 mg by mouth every evening.    [provider]  finasteride (PROSCAR) 5 MG tablet Take 5 mg by mouth at bedtime.  05/16/15   [provider]  hydrochlorothiazide (HYDRODIURIL) 12.5 MG tablet Take 12.5 mg by mouth daily.    [provider]  Omega-3 Fatty Acids (FISH OIL) 1200 MG CAPS Take 1,200 mg by mouth every evening.    [provider]  Polyethyl Glycol-Propyl Glycol (SYSTANE) 0.4-0.3 % SOLN Place 1 drop into both eyes 2 (two) times daily as needed (for dry/irritated eyes.).    [provider]  rivaroxaban (XARELTO) 20 MG TABS tablet TAKE 1 TABLET (20 MG TOTAL) BY MOUTH DAILY WITH SUPPER. Patient taking differently: Take 20 mg by mouth daily with supper.  06/16/19   Croitoru, Mihai, MD  simvastatin (ZOCOR) 20 MG tablet Take 20 mg by mouth every evening.    [provider]  traMADol (ULTRAM) 50 MG tablet Take 1 tablet (50 mg total) by mouth every 6 (six) hours as needed (pain). Patient taking differently: Take 25 mg by mouth 2 (two) times daily.  04/23/18   [name], MD  Vitamin Mixture (SELENIUM-VITAMIN E) 50-400 MCG-UNIT CAPS Take 1 capsule by mouth every evening.    [provider]    Allergies    Levaquin [levofloxacin in d5w] and Penicillins  Review of Systems   Review of Systems  Constitutional: Negative for fatigue.  Musculoskeletal: Positive for arthralgias and gait problem. Negative for back pain, joint swelling, myalgias, neck pain and neck stiffness.  Skin: Negative for color change, pallor, rash and wound.  Neurological: Negative for weakness.    Physical Exam Updated Vital Signs BP (!) 149/84   Pulse 66   Temp 98.3 F (36.8 C) (Oral)   Resp 17   SpO2 100%   Physical Exam Constitutional:      General: He is not in acute distress.    Appearance: He is not ill-appearing.  Cardiovascular:     Pulses: Normal pulses.  Musculoskeletal:         General: Tenderness (TTP to right midfoot) present. No swelling. Normal range of motion.     Right lower leg: No edema.     Left lower leg: No edema.  Skin:    General: Skin is warm.     Capillary Refill: Capillary refill takes less than 2 seconds.     Findings: No bruising, erythema or rash.  Neurological:     General: No focal deficit present.     Mental Status: He is alert.     Sensory: No sensory deficit.     Motor: No weakness.     ED Results / Procedures / Treatments   Labs (all labs ordered are listed, but only abnormal  results are displayed) Labs Reviewed - No data to display  EKG None  Radiology DG Foot Complete Right  Result Date: 03/03/2020 CLINICAL DATA:  Right foot pain and swelling for about a month. Stepped on a nail 2 years ago. EXAM: RIGHT FOOT COMPLETE - 3+ VIEW COMPARISON:  Remote foot radiograph 03/21/2011, MRI 04/01/2011. FINDINGS: There is no evidence of fracture or dislocation. Osteoarthritis of the first metatarsal phalangeal joint with joint space narrowing and subchondral cystic change. Mild adjacent soft tissue thickening. Minor midfoot osteoarthritis. There is a plantar calcaneal spur. Mild soft tissue edema overlies the dorsum of the metatarsals. No radiopaque foreign body or soft tissue air. IMPRESSION: 1. Osteoarthritis of the first metatarsophalangeal joint with adjacent cystic changes, chronic and stable since 2012. 2. Mild midfoot osteoarthritis. 3. Small plantar calcaneal spur. Electronically Signed   By: Keith Rake M.D.   On: 03/03/2020 18:44    Procedures Procedures (including critical care time)  Medications Ordered in ED Medications - No data to display  ED Course  I have reviewed the triage vital signs and the nursing notes.  Pertinent labs & imaging results that were available during my care of the patient were reviewed by me and considered in my medical decision making (see chart for details).    MDM Rules/Calculators/A&P                       Cordarro ADARRYLL LARRIMORE is a 79 year old male with history of hypertension who presents to the ED with right foot pain.  Patient with normal vitals.  No fever.  Patient having intermittent right foot pain for the last several months.  About a year ago he stepped on a nail in his foot.  States that he felt like he has had pain since then.  Patient overall has no signs of infection or gout on exam.  X-ray showed osteoarthritis in the midfoot and small calcaneal spur.  Arthritis of the first metatarsal phalangeal joint as well.  Overall pain and intermittent swelling likely from arthritis.  Recommend ice, Tylenol, Motrin.  Recommend follow-up with primary care doctor for further chronic pain management.  Discharged in good condition.  Given return precautions.  This chart was dictated using voice recognition software.  Despite best efforts to proofread,  errors can occur which can change the documentation meaning.   Final Clinical Impression(s) / ED Diagnoses Final diagnoses:  Foot swelling    Rx / DC Orders ED Discharge Orders    None       Lennice Sites, DO 03/03/20 1909

## 2020-03-03 NOTE — ED Notes (Signed)
Pt sitting up in bed awake and talking. NAD noted. Pt ready for d/c

## 2020-03-03 NOTE — Discharge Instructions (Addendum)
X-ray shows arthritis in your right foot.  Follow-up with your primary care doctor for further pain management.

## 2020-03-05 DIAGNOSIS — R609 Edema, unspecified: Secondary | ICD-10-CM | POA: Diagnosis not present

## 2020-03-05 DIAGNOSIS — I10 Essential (primary) hypertension: Secondary | ICD-10-CM | POA: Diagnosis not present

## 2020-03-05 DIAGNOSIS — L84 Corns and callosities: Secondary | ICD-10-CM | POA: Diagnosis not present

## 2020-03-06 ENCOUNTER — Ambulatory Visit: Payer: BC Managed Care – PPO | Admitting: Hematology

## 2020-03-08 ENCOUNTER — Other Ambulatory Visit: Payer: Self-pay

## 2020-03-08 ENCOUNTER — Ambulatory Visit (INDEPENDENT_AMBULATORY_CARE_PROVIDER_SITE_OTHER): Payer: Medicare HMO | Admitting: Podiatry

## 2020-03-08 ENCOUNTER — Ambulatory Visit: Payer: Medicare HMO

## 2020-03-08 DIAGNOSIS — M795 Residual foreign body in soft tissue: Secondary | ICD-10-CM

## 2020-03-08 DIAGNOSIS — M21962 Unspecified acquired deformity of left lower leg: Secondary | ICD-10-CM

## 2020-03-08 DIAGNOSIS — M21961 Unspecified acquired deformity of right lower leg: Secondary | ICD-10-CM

## 2020-03-12 ENCOUNTER — Ambulatory Visit (HOSPITAL_COMMUNITY)
Admission: RE | Admit: 2020-03-12 | Discharge: 2020-03-12 | Disposition: A | Discharge status: Home / Self Care | Payer: Medicare HMO | Source: Ambulatory Visit | Attending: Hematology | Admitting: Hematology

## 2020-03-12 ENCOUNTER — Other Ambulatory Visit: Payer: Self-pay

## 2020-03-12 DIAGNOSIS — K7689 Other specified diseases of liver: Secondary | ICD-10-CM | POA: Insufficient documentation

## 2020-03-12 DIAGNOSIS — C8590 Non-Hodgkin lymphoma, unspecified, unspecified site: Secondary | ICD-10-CM | POA: Diagnosis not present

## 2020-03-12 DIAGNOSIS — C8582 Other specified types of non-Hodgkin lymphoma, intrathoracic lymph nodes: Secondary | ICD-10-CM | POA: Diagnosis not present

## 2020-03-12 DIAGNOSIS — R1900 Intra-abdominal and pelvic swelling, mass and lump, unspecified site: Secondary | ICD-10-CM | POA: Diagnosis not present

## 2020-03-12 LAB — GLUCOSE, CAPILLARY: Glucose-Capillary: 106 mg/dL — ABNORMAL HIGH (ref 70–99)

## 2020-03-12 MED ORDER — FLUDEOXYGLUCOSE F - 18 (FDG) INJECTION
9.4800 | Freq: Once | INTRAVENOUS | Status: AC | PRN
  Administered 2020-03-12: 9.48 via INTRAVENOUS

## 2020-03-12 MED ORDER — GADOBUTROL 1 MMOL/ML IV SOLN
7.0000 mL | Freq: Once | INTRAVENOUS | Status: AC | PRN
  Administered 2020-03-12: 15:00:00 7 mL via INTRAVENOUS

## 2020-03-19 ENCOUNTER — Inpatient Hospital Stay: Payer: Medicare HMO | Attending: Hematology | Admitting: Hematology

## 2020-03-19 DIAGNOSIS — Z803 Family history of malignant neoplasm of breast: Secondary | ICD-10-CM | POA: Diagnosis not present

## 2020-03-19 DIAGNOSIS — Z800 Family history of malignant neoplasm of digestive organs: Secondary | ICD-10-CM | POA: Diagnosis not present

## 2020-03-19 DIAGNOSIS — R61 Generalized hyperhidrosis: Secondary | ICD-10-CM | POA: Diagnosis not present

## 2020-03-19 DIAGNOSIS — J209 Acute bronchitis, unspecified: Secondary | ICD-10-CM | POA: Insufficient documentation

## 2020-03-19 DIAGNOSIS — M1990 Unspecified osteoarthritis, unspecified site: Secondary | ICD-10-CM | POA: Diagnosis not present

## 2020-03-19 DIAGNOSIS — C8580 Other specified types of non-Hodgkin lymphoma, unspecified site: Secondary | ICD-10-CM

## 2020-03-19 DIAGNOSIS — Z8249 Family history of ischemic heart disease and other diseases of the circulatory system: Secondary | ICD-10-CM | POA: Insufficient documentation

## 2020-03-19 DIAGNOSIS — I700 Atherosclerosis of aorta: Secondary | ICD-10-CM | POA: Insufficient documentation

## 2020-03-19 DIAGNOSIS — Z87891 Personal history of nicotine dependence: Secondary | ICD-10-CM | POA: Insufficient documentation

## 2020-03-19 DIAGNOSIS — Z7189 Other specified counseling: Secondary | ICD-10-CM

## 2020-03-19 DIAGNOSIS — Z833 Family history of diabetes mellitus: Secondary | ICD-10-CM | POA: Insufficient documentation

## 2020-03-19 DIAGNOSIS — C884 Extranodal marginal zone B-cell lymphoma of mucosa-associated lymphoid tissue [MALT-lymphoma]: Secondary | ICD-10-CM | POA: Insufficient documentation

## 2020-03-19 DIAGNOSIS — I2510 Atherosclerotic heart disease of native coronary artery without angina pectoris: Secondary | ICD-10-CM | POA: Diagnosis not present

## 2020-03-19 DIAGNOSIS — Z79899 Other long term (current) drug therapy: Secondary | ICD-10-CM | POA: Diagnosis not present

## 2020-03-19 DIAGNOSIS — I10 Essential (primary) hypertension: Secondary | ICD-10-CM | POA: Diagnosis not present

## 2020-03-19 MED ORDER — PREDNISONE 20 MG PO TABS: 20.0000 mg | ORAL_TABLET | Freq: Every day | ORAL | 0 refills | Status: AC

## 2020-03-19 MED ORDER — TRIAMCINOLONE ACETONIDE 0.5 % EX OINT
1.0000 | TOPICAL_OINTMENT | Freq: Two times a day (BID) | CUTANEOUS | 0 refills | Status: DC
Start: 2020-03-19 — End: 2020-04-30

## 2020-03-19 NOTE — Progress Notes (Signed)
HEMATOLOGY/ONCOLOGY CLINIC NOTE  Date of Service: 03/19/2020  Patient Care Team: Antony Contras, MD as PCP - General (Family Medicine)  CHIEF COMPLAINTS/PURPOSE OF CONSULTATION:  Extranodal Marginal Zone MALT Lymphoma  HISTORY OF PRESENTING ILLNESS:   James Sweeney is a wonderful 79 y.o. male who has been referred to [name] by Dr Antony Contras for evaluation and management of Extranodal Marginal Zone MALT Lymphoma. He is accompanied today by his wife. The pt reports that he is doing well overall.   The pt notes that about 4-5 years ago his urologist first discovered a small mass in his lung. He had a CT  in June 2016 which showed this mass. A navigation bronchoscopy EBUS was performed in March 2017 that did not reveal a definitive pathology. A repeat bx (VATS  With wedge resection of  RML) biopsy was completed on 04/12/18 which revealed an Extranodal marginal zone lymphoma. He denies any constitutional symptoms. The pt reports that he still has some soreness at his surgical site but denies any difficulty breathing.   He is taking Xarelto for Afib.   He last had pneumonia last summer 2018, and has had pneumonia twice. He maintains regular follow up with his PCP Dr Antony Contras.   Most recent lab results (04/12/18) of CBC  is as follows: all values are WNL except for RBC at 3.44, HGB at 11.8, HCT at 35.3, MCV at 102.6, MCH at 34.3, PLT at 126k.   On review of systems, pt reports good energy levels, chest soreness at surgical site, improving breathing, constipation, and denies fevers, chills, night sweats, unexpected weight loss, abdominal pains, leg swelling, and any other symptoms.   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 He notes having breathed in methanol/ethanol exhaust fumes in his work at times.   Interval History:  I connected with  James Sweeney on 03/19/20 by telephone and verified that I am speaking with the correct person using two identifiers.     I discussed the limitations of evaluation and management by telemedicine. The patient expressed understanding and agreed to proceed.  Other persons participating in the visit and their role in the encounter:       -Yevette Edwards, [place], Pt's wife  Patient's location: Home Provider's location: [place] [facility] [employer] returns today for management and evaluation of his Extranodal Marginal Zone MALT lymphoma. We are joined today by his wife, James Sweeney. The patient's last visit with [name] was on 02/21/2020. The pt reports that he is doing well overall.  The pt reports that he has an itchy, raised, red rash on his legs and around his waist. This rash is not blistering and is not present on his genitals, in his mouth, or on the palms of his hands or soles of his feet. Pt has had this rash for two weeks and has been using Calamine lotion to treat. His PCP increased the dose of some of his current medications but pt denies any new medications. He denies any respiratory symptoms, but has had some night sweats over the last few years. Pt feels that his night sweats have been stable overall, if not  Recently improved.   Of note since the patient's last visit, pt has had PET/CT ([id number]) completed on 03/12/2020 with results revealing "1. Interval increase in size and FDG uptake associated with subpleural soft tissue within the anterior right upper lobe  compatible with Deauville criteria 4/5 disease. 2. Paravertebral soft tissue within the posterior mediastinum medial to the descending aorta exhibits increased FDG uptake. Deauville criteria 4. 3. Mild increase in size and degree of FDG uptake associated with bilateral pulmonary nodules. 4. Similar mild to moderate increased FDG uptake associated with calcified mediastinal and hilar lymph nodes. This is a nonspecific finding and may reflect changes of granulomatous inflammation or infection. 5. Similar appearance of  indeterminate right adrenal nodule which exhibits mild to moderate FDG uptake with SUV max of 5.4 6. No significant FDG uptake within the liver. 7.  Aortic Atherosclerosis (ICD10-I70.0). 8. Coronary artery calcifications."  Pt has had MRI Liver ([id number]) completed on 03/12/2020 with results revealing 1. Lesion of concern in segment II of the LEFT hepatic lobe is most consistent with benign hemangioma. No hypermetabolic lesion on comparison FDG PET scan. 2. Enlargement of the RIGHT adrenal gland is indeterminate. Lesion was hypermetabolic on comparison FDG PET scan. 3. No suspicious renal lesions. 4. No lymphadenopathy."  On review of systems, pt reports rash, night sweats and denies cough, SOB, chest pain, fevers, chills and any other symptoms.   MEDICAL HISTORY:  Past Medical History:  Diagnosis Date  . Cervical spondylosis without myelopathy 12/01/2013  . Diabetes mellitus    borderline  . Enlarged prostate   . Frequency of urination   . Hearing deficit   . Heart murmur   . History of kidney stones    removed with Lithotripsy  . HOH (hard of hearing)    right ear  . Hyperlipemia   . Hypertension   . Lumbago 12/01/2013  . Pneumonia 2016  . Polio    right leg smaller than left  . Shortness of breath dyspnea    with exertion    SURGICAL HISTORY: Past Surgical History:  Procedure Laterality Date  . CATARACT EXTRACTION Bilateral 2014  . LUNG BIOPSY Right 2018   March  . VIDEO ASSISTED THORACOSCOPY (VATS)/WEDGE RESECTION Right 04/12/2018   Procedure: VIDEO ASSISTED THORACOSCOPY (VATS), WEDGE RESECTION RIGHT MIDDLE LOBE, PLACEMENT OF ON Q CATHETER;  Surgeon: [name], MD;  Location: [facility];  Service: Thoracic;  Laterality: Right;  [name] VIDEO BRONCHOSCOPY N/A 04/12/2018   Procedure: VIDEO BRONCHOSCOPY;  Surgeon: [name], MD;  Location: [facility];  Service: Thoracic;  Laterality: N/A;  . VIDEO BRONCHOSCOPY WITH ENDOBRONCHIAL NAVIGATION N/A 01/25/2016   Procedure: VIDEO  BRONCHOSCOPY WITH ENDOBRONCHIAL NAVIGATION;  Surgeon: [name], MD;  Location: [facility];  Service: Thoracic;  Laterality: N/A;  . VIDEO BRONCHOSCOPY WITH ENDOBRONCHIAL ULTRASOUND N/A 01/25/2016   Procedure: VIDEO BRONCHOSCOPY WITH ENDOBRONCHIAL ULTRASOUND;  Surgeon: [name], MD;  Location: [facility];  Service: Thoracic;  Laterality: N/A;    SOCIAL HISTORY: Social History   Socioeconomic History  . Marital status: Married    Spouse name: Not on file  . Number of children: 2  . Years of education: Not on file  . Highest education level: Not on file  Occupational History  . Not on file  Tobacco Use  . Smoking status: Former Smoker    Packs/day: 0.50    Years: 9.00    Pack years: 4.50    Types: Cigarettes    Quit date: 12/02/1967    Years since quitting: 52.3  . Smokeless tobacco: Former Systems developer    Types: Chew    Quit date: 2000  Substance and Sexual Activity  . Alcohol use: No  . Drug use: No  . Sexual activity: Never  Other Topics Concern  . Not on file  Social History Narrative   Patient is right handed.   Patient drinks 1 cup caffeine daily.      Pt has been married for 62 years and has 2 children, 4 grandchildren, and 3 great-grandchildren. Lives with everyone except great-grandchildren. He does not clean house, but can shop, do yard work, and drive. 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         Epworth Sleepiness Scale Score:  8      --I have HTN   --I seem to be losing my sex drive   --I wake up to urinate frequently at night   --I awake feeling not rested   --I have borderline diabetes   Social Determinants of Health   Financial Resource Strain:   . Difficulty of Paying Living Expenses:   Food Insecurity:   . Worried About Charity fundraiser in the Last Year:   . Arboriculturist in the Last Year:   Transportation Needs:   . Film/video editor (Medical):   [name] Lack of Transportation (Non-Medical):   Physical Activity:     . Days of Exercise per Week:   . Minutes of Exercise per Session:   Stress:   . Feeling of Stress :   Social Connections:   . Frequency of Communication with Friends and Family:   . Frequency of Social Gatherings with Friends and Family:   . Attends Religious Services:   . Active Member of Clubs or Organizations:   . Attends Archivist Meetings:   [name] Marital Status:   Intimate Partner Violence:   . Fear of Current or Ex-Partner:   . Emotionally Abused:   [name] Physically Abused:   . Sexually Abused:     FAMILY HISTORY: Family History  Problem Relation Age of Onset  . Cancer Mother        Stomach  . Cancer - Lung Father   . Heart attack Brother   . Diabetes Brother     ALLERGIES:  is allergic to levaquin [levofloxacin in d5w] and penicillins.  MEDICATIONS:  Current Outpatient Medications  Medication Sig Dispense Refill  . alfuzosin (UROXATRAL) 10 MG 24 hr tablet Take 10 mg by mouth at bedtime.     [name] amLODipine (NORVASC) 10 MG tablet Take 10 mg by mouth daily.    . Ascorbic Acid (SUPER C COMPLEX PO) Take 1 capsule by mouth every evening. Super C 1000    . Calcium Carb-Cholecalciferol (CALCIUM 600+D3 PO) Take 1 tablet by mouth every evening.    . Cyanocobalamin (VITAMIN B-12) 5000 MCG SUBL Place 1 tablet under the tongue every evening.     . Ferrous Sulfate (IRON) 28 MG TABS Take 28 mg by mouth every evening.    . finasteride (PROSCAR) 5 MG tablet Take 5 mg by mouth at bedtime.     . hydrochlorothiazide (HYDRODIURIL) 12.5 MG tablet Take 12.5 mg by mouth daily.    . Omega-3 Fatty Acids (FISH OIL) 1200 MG CAPS Take 1,200 mg by mouth every evening.    Vladimir Faster Glycol-Propyl Glycol (SYSTANE) 0.4-0.3 % SOLN Place 1 drop into both eyes 2 (two) times daily as needed (for dry/irritated eyes.).    [name] predniSONE (DELTASONE) 20 MG tablet Take 1 tablet (20 mg total) by mouth daily with breakfast for 7 days. 7 tablet 0  . rivaroxaban (XARELTO) 20 MG TABS tablet TAKE 1 TABLET (20  MG TOTAL)  BY MOUTH DAILY WITH SUPPER. (Patient taking differently: Take 20 mg by mouth daily with supper. ) 90 tablet 3  . simvastatin (ZOCOR) 20 MG tablet Take 20 mg by mouth every evening.    . traMADol (ULTRAM) 50 MG tablet Take 1 tablet (50 mg total) by mouth every 6 (six) hours as needed (pain). (Patient taking differently: Take 25 mg by mouth 2 (two) times daily. ) 28 tablet 0  . triamcinolone ointment (KENALOG) 0.5 % Apply 1 application topically 2 (two) times daily for 15 days. To itchy rash on extremities and waist. Do not apply to face. 30 g 0  . Vitamin Mixture (SELENIUM-VITAMIN E) 50-400 MCG-UNIT CAPS Take 1 capsule by mouth every evening.     No current facility-administered medications for this visit.    REVIEW OF SYSTEMS:   A 10+ POINT REVIEW OF SYSTEMS WAS OBTAINED including neurology, dermatology, psychiatry, cardiac, respiratory, lymph, extremities, GI, GU, Musculoskeletal, constitutional, breasts, reproductive, HEENT.  All pertinent positives are noted in the HPI.  All others are negative.   PHYSICAL EXAMINATION: ECOG PERFORMANCE STATUS: 1 - Symptomatic but completely ambulatory  Telehealth visit  LABORATORY DATA:  I have reviewed the data as listed  . CBC Latest Ref Rng & Units 02/21/2020 11/29/2019 09/18/2019  WBC 4.0 - 10.5 K/uL 2.2(L) 2.4(L) 2.5(L)  Hemoglobin 13.0 - 17.0 g/dL 12.1(L) 11.8(L) 12.2(L)  Hematocrit 39.0 - 52.0 % 35.6(L) 34.7(L) 36.5(L)  Platelets 150 - 400 K/uL 129(L) 115(L) 132(L)   ANC 1.6K  . CMP Latest Ref Rng & Units 02/21/2020 11/29/2019 09/18/2019  Glucose 70 - 99 mg/dL 187(H) 131(H) 157(H)  BUN 8 - 23 mg/dL 18 15 18   Creatinine 0.61 - 1.24 mg/dL 1.27(H) 1.07 1.21  Sodium 135 - 145 mmol/L 139 141 139  Potassium 3.5 - 5.1 mmol/L 4.1 4.0 4.0  Chloride 98 - 111 mmol/L 103 104 104  CO2 22 - 32 mmol/L 29 28 25   Calcium 8.9 - 10.3 mg/dL 9.2 8.9 9.2  Total Protein 6.5 - 8.1 g/dL 7.0 7.0 -  Total Bilirubin 0.3 - 1.2 mg/dL 0.8 0.8 -  Alkaline  Phos 38 - 126 U/L 92 94 -  AST 15 - 41 U/L 23 22 -  ALT 0 - 44 U/L 34 29 -   . Lab Results  Component Value Date   LDH 173 02/21/2020    04/12/18 Flow Cytometry:   04/12/18 Lung biopsy:     RADIOGRAPHIC STUDIES: I have personally reviewed the radiological images as listed and agreed with the findings in the report. MR LIVER W WO CONTRAST  Result Date: 03/13/2020 CLINICAL DATA:  Abdominal mass. Lymphoma. Indeterminate liver lesion on prior CT. EXAM: MRI ABDOMEN WITHOUT AND WITH CONTRAST TECHNIQUE: Multiplanar multisequence MR imaging of the abdomen was performed both before and after the administration of intravenous contrast. CONTRAST:  40mL GADAVIST GADOBUTROL 1 MMOL/ML IV SOLN COMPARISON:  PET CT 03/12/2020, abdominal CT 09/17/2020 FINDINGS: Lower chest: Lung bases are clear. Hepatobiliary: Several well-circumscribed hepatic cysts. The hypervascular lesion described on CT 12/18/2018 in segment 2 liver is a subtle on today's MRI scan. Lesion is present as hyperintensity on T2 image 16/series 5. Lesion demonstrates subtle peripheral enhancement on postcontrast T1 weighted imaging (image 31/15). On more delayed imaging lesion becomes isointense to liver parenchyma. Lesion is most consistent with benign hemangioma. Additionally, there is no hypermetabolic activity through this region on comparison PET-CT scan same day. Pancreas: No pancreatic duct dilatation. There is ductal ectasia in the body and tail the  pancreas. Spleen: Normal spleen Adrenals/urinary tract: Again demonstrated enlargement of the RIGHT adrenal gland to 19 mm (image 48/series 6). Lesion does not have typical loss of signal intensity of a adenoma on opposed phase imaging. Lesion is hyperintense on T2 weighted imaging (image 20/22) and demonstrates post-contrast enhancement. On comparison FDG scan same day lesion is hypermetabolic. No suspicious renal lesion. Nonenhancing cyst of the RIGHT kidney. Stomach/Bowel: Stomach, small  bowel, appendix, and cecum are normal. The colon and rectosigmoid colon are normal. Vascular/Lymphatic: No retroperitoneal or periportal adenopathy. Other: No free fluid. Musculoskeletal: No aggressive osseous lesion. IMPRESSION: 1. Lesion of concern in segment II of the LEFT hepatic lobe is most consistent with benign hemangioma. No hypermetabolic lesion on comparison FDG PET scan. 2. Enlargement of the RIGHT adrenal gland is indeterminate. Lesion was hypermetabolic on comparison FDG PET scan. 3. No suspicious renal lesions. 4. No lymphadenopathy. Electronically Signed   By: Suzy Bouchard M.D.   On: 03/13/2020 09:58   NM PET Image Restag (PS) Skull Base To Thigh  Result Date: 03/13/2020 CLINICAL DATA:  Subsequent treatment strategy for lymphoma. EXAM: NUCLEAR MEDICINE PET SKULL BASE TO THIGH TECHNIQUE: 9.48 mCi F-18 FDG was injected intravenously. Full-ring PET imaging was performed from the skull base to thigh after the radiotracer. CT data was obtained and used for attenuation correction and anatomic localization. Fasting blood glucose: 106 mg/dl COMPARISON:  07/21/2018 FINDINGS: Mediastinal blood pool activity: SUV max 2.65 Liver activity: SUV max 3.73 NECK: No hypermetabolic lymph nodes in the neck. Incidental CT findings: none CHEST: Subpleural thickening within the anterior right upper lobe measures 4.5 x 1.2 cm and has an SUV max of 8.06, image 32/8. Previously 4.2 x 1.1 cm with SUV max of 5.5. Mild to moderate increased FDG uptake is associated with calcified mediastinal and hilar lymph nodes: -Index right hilar lymph nodes have an SUV max of 5.83. Previously 5.4. -Index subcarinal lymph node has an SUV max of 4.57. Previously 5.2. -Left the paravertebral soft tissue medial to the descending thoracic aorta is again identified with SUV max with SUV max of 5.98. Previously 4.2. Superior segment left lower lobe lung nodule measures 1.2 cm and has an SUV max of 3.2, image 22/8. Previous this measured 1.1  cm with SUV max of 1.49. Ill-defined nodule within the central right upper lobe measures 1.5 cm with SUV max of 2.6. Previously this measured 1 cm with SUV max of 1.39. Incidental CT findings: Lad and RCA coronary artery calcifications. Aortic atherosclerosis. ABDOMEN/PELVIS: No abnormal activity within the liver, pancreas, or spleen. Nodule within the right adrenal gland measures 2.6 cm and has an SUV max of 5.4. Previously this measured 2.5 cm within SUV max of 4.3. Incidental CT findings: Aortic atherosclerosis.  No aneurysm. SKELETON: No focal hypermetabolic activity to suggest skeletal metastasis. Incidental CT findings: none IMPRESSION: 1. Interval increase in size and FDG uptake associated with subpleural soft tissue within the anterior right upper lobe compatible with Deauville criteria 4/5 disease. 2. Paravertebral soft tissue within the posterior mediastinum medial to the descending aorta exhibits increased FDG uptake. Deauville criteria 4. 3. Mild increase in size and degree of FDG uptake associated with bilateral pulmonary nodules. 4. Similar mild to moderate increased FDG uptake associated with calcified mediastinal and hilar lymph nodes. This is a nonspecific finding and may reflect changes of granulomatous inflammation or infection. 5. Similar appearance of indeterminate right adrenal nodule which exhibits mild to moderate FDG uptake with SUV max of 5.4 6. No significant FDG  uptake within the liver. 7.  Aortic Atherosclerosis (ICD10-I70.0). 8. Coronary artery calcifications. Electronically Signed   By: Kerby Moors M.D.   On: 03/13/2020 09:14   DG Foot Complete Right  Result Date: 03/03/2020 CLINICAL DATA:  Right foot pain and swelling for about a month. Stepped on a nail 2 years ago. EXAM: RIGHT FOOT COMPLETE - 3+ VIEW COMPARISON:  Remote foot radiograph 03/21/2011, MRI 04/01/2011. FINDINGS: There is no evidence of fracture or dislocation. Osteoarthritis of the first metatarsal phalangeal joint  with joint space narrowing and subchondral cystic change. Mild adjacent soft tissue thickening. Minor midfoot osteoarthritis. There is a plantar calcaneal spur. Mild soft tissue edema overlies the dorsum of the metatarsals. No radiopaque foreign body or soft tissue air. IMPRESSION: 1. Osteoarthritis of the first metatarsophalangeal joint with adjacent cystic changes, chronic and stable since 2012. 2. Mild midfoot osteoarthritis. 3. Small plantar calcaneal spur. Electronically Signed   By: Keith Rake M.D.   On: 03/03/2020 18:44    ASSESSMENT & PLAN:  79 y.o. male with  1. 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  If this is an isolated lesion this would make in Stage IE.  04/12/18 bx/resection of the right middle lobe revealed an extranodal marginal zone lymphoma   07/21/18 PET/CT revealed Interval resection of the RIGHT middle lobe pulmonary nodule. 2. Increase thickening of subpleural tissue in the RIGHT upper lobe with increased metabolic activity. In patient with lymphoma history, cannot exclude atypical thoracic lymphoma. 3. No change in small hypermetabolic mediastinal lymph nodes which are favored reactive. 4. Prostatomegaly with nodularity. 5. Stable RIGHT adrenal mass. Favor benign.  03/11/19 CT Chest revealed "Ill-defined ground-glass and consolidative nodules in both lungs remain stable, except for mild increase in size of 1.6 cm central right perihilar nodule. Consider continued followup by chest CT in 6 months. 2. Stable mild partially-calcified mediastinal and bilateral hilar lymphadenopathy. 3. Stable 2.6 cm right adrenal mass,, likely a benign adenoma. Recommend continued attention on follow-up CT. 4. Aortic and coronary artery atherosclerosis."   09/18/2019 CT Angio Chest/Abd/Pel ([id number]) revealed "1. No acute vascular findings. 2. Trace new right pleural effusion, cause uncertain. 3. Similar sub solid nodules and sub solid right subpleural airspace opacity. In  this patient with history of lymphoma, the possibility of atypical pulmonary involvement is not readily excluded. Surveillance imaging of the lung lesions is recommended. 4. Nonspecific 1.8 cm focus of arterial phase enhancement in segment 2 of the liver. There is no prior accentuated metabolic activity in this vicinity on prior PET-CT of 07/21/2018. Possibilities for further workup of this lesion include hepatic protocol MRI with and without contrast, or surveillance on follow up imaging related to the patient's lymphoma. 5. Chronically stable right adrenal mass, likely benign. 6. Other imaging findings of potential clinical significance: Aortic Atherosclerosis (ICD10-I70.0). Coronary atherosclerosis. Mild cardiomegaly. Calcified mediastinal adenopathy, stable. Postoperative findings in the right upper lobe. Stable small well-defined foci of sclerosis in the right fourth rib. Chronic superior endplate compression at L1. Right foraminal stenosis at L4-5 due to spurring. Lipoma deep to the left gluteus maximus muscle, stable, this mildly abuts the left sciatic nerve. Prostatomegaly."  2) Acute Bronchitis  PLAN:  -Discussed 03/12/2020 PET/CT ([id number]) which revealed "1. Interval increase in size and FDG uptake associated with subpleural soft tissue within the anterior right upper lobe compatible with Deauville criteria 4/5 disease. 2. Paravertebral soft tissue within the posterior mediastinum medial to the descending aorta exhibits increased FDG uptake. Deauville criteria 4. 3. Mild increase  in size and degree of FDG uptake associated with bilateral pulmonary nodules. 4. Similar mild to moderate increased FDG uptake associated with calcified mediastinal and hilar lymph nodes. This is a nonspecific finding and may reflect changes of granulomatous inflammation or infection. 5. Similar appearance of indeterminate right adrenal nodule which exhibits mild to moderate FDG uptake with SUV max of 5.4 6. No significant  FDG uptake within the liver." -Discussed 03/12/2020 MRI Liver ([id number]) which revealed benign hemangioma, no hypermetabolic lesion -Advised pt that the pulmonary nodules and lymphadenopathy visualized on PET/CT are most likely caused by his MALT lymphoma -Advised pt that we will need to begin treatment sometime soon  -Advised pt that due to his age would not be as aggressive with treatment -Pt has completed his COVID-19 vaccinations -Advised pt that rash appears to be caused by allergies and is not medication related, based on symptoms -Recommend OTC 25 mg Benadryl q6hrs prn -Rx short-course Prednisone, topical ointment  -Will set up chemo counseling in 3 weeks  -Will begin pt on weekly Rituxan x4 -Plan to repeat PET/CT after C4 -Will see back with C1    2.  Patient Active Problem List   Diagnosis Date Noted  . Renal insufficiency 04/19/2018  . Chronic anticoagulation 04/19/2018  . Lung mass 04/12/2018  . Permanent atrial fibrillation ([place]) 01/19/2018  . Pneumothorax 01/25/2016  . Pneumothorax after biopsy 01/25/2016  . Coronary artery calcification seen on CT scan 01/21/2016  . Murmur, cardiac 01/21/2016  . Hypercholesterolemia 01/21/2016  . Essential hypertension 01/09/2016  . BPH (benign prostatic hyperplasia) 01/09/2016  . Solitary pulmonary nodule 01/09/2016  . Thrombocytopenia ([place]) 01/09/2016  . Cervical spondylosis without myelopathy 12/01/2013  . Lumbago 12/01/2013  . Diabetes mellitus     FOLLOW UP: Plz schedule for chemo-counseling for Rituxan in 3 weeks Plz schedule to start weekly Rituxan x 4 doses with weekly labs starting in 4 weeks MD visit with 1st, 2nd and 4th doses of Rituxan.    The total time spent in the appt was 30 minutes and more than 50% was on counseling and direct patient cares.  All of the patient's questions were answered with apparent satisfaction. The patient knows to call the clinic with any problems, questions or concerns.    Sullivan Lone MD [place] AAHIVMS [facility] Hematology/Oncology Physician [facility]  (Office):       [phone] (Work cell):  [phone] (Fax):           [phone]  03/19/2020 10:38 AM  I, Yevette Edwards, am acting as a scribe for Dr. Sullivan Lone.   .I have reviewed the above documentation for accuracy and completeness, and I agree with the above. Brunetta Genera MD

## 2020-03-23 ENCOUNTER — Telehealth: Payer: Self-pay | Admitting: Hematology

## 2020-03-23 NOTE — Telephone Encounter (Signed)
Scheduled per 05/17 los, spoke with patient's wife and he will be notified of upcoming appointments.

## 2020-03-25 DIAGNOSIS — Z7189 Other specified counseling: Secondary | ICD-10-CM | POA: Insufficient documentation

## 2020-03-25 DIAGNOSIS — C8580 Other specified types of non-Hodgkin lymphoma, unspecified site: Secondary | ICD-10-CM | POA: Insufficient documentation

## 2020-03-25 NOTE — Progress Notes (Signed)
START ON PATHWAY REGIMEN - Lymphoma and CLL     Administer weekly:     Rituximab-xxxx   **Always confirm dose/schedule in your pharmacy ordering system**  Patient Characteristics: Marginal Zone Lymphoma, Systemic, First Line, Symptomatic Disease Type: Marginal Zone Lymphoma Disease Type: Not Applicable Disease Type: Not Applicable Localized or Systemic Disease<= Systemic [place] Stage: IV Line of Therapy: First Line Asymptomatic or Symptomatic<= Symptomatic Intent of Therapy: Non-Curative / Palliative Intent, Discussed with Patient 

## 2020-03-30 DIAGNOSIS — D649 Anemia, unspecified: Secondary | ICD-10-CM | POA: Diagnosis not present

## 2020-03-30 DIAGNOSIS — I4891 Unspecified atrial fibrillation: Secondary | ICD-10-CM | POA: Diagnosis not present

## 2020-03-30 DIAGNOSIS — E1169 Type 2 diabetes mellitus with other specified complication: Secondary | ICD-10-CM | POA: Diagnosis not present

## 2020-03-30 DIAGNOSIS — N400 Enlarged prostate without lower urinary tract symptoms: Secondary | ICD-10-CM | POA: Diagnosis not present

## 2020-03-30 DIAGNOSIS — I10 Essential (primary) hypertension: Secondary | ICD-10-CM | POA: Diagnosis not present

## 2020-03-30 DIAGNOSIS — E782 Mixed hyperlipidemia: Secondary | ICD-10-CM | POA: Diagnosis not present

## 2020-03-30 DIAGNOSIS — C8582 Other specified types of non-Hodgkin lymphoma, intrathoracic lymph nodes: Secondary | ICD-10-CM | POA: Diagnosis not present

## 2020-03-30 DIAGNOSIS — M5412 Radiculopathy, cervical region: Secondary | ICD-10-CM | POA: Diagnosis not present

## 2020-03-30 DIAGNOSIS — J309 Allergic rhinitis, unspecified: Secondary | ICD-10-CM | POA: Diagnosis not present

## 2020-04-05 NOTE — Progress Notes (Signed)
Pharmacist Chemotherapy Monitoring - Initial Assessment    Anticipated start date: 04/11/20   Regimen:  . Are orders appropriate based on the patient's diagnosis, regimen, and cycle? Yes . Does the plan date match the patient's scheduled date? Yes . Is the sequencing of drugs appropriate? Yes . Are the premedications appropriate for the patient's regimen? Yes . Prior Authorization for treatment is: Approved o If applicable, is the correct biosimilar selected based on the patient's insurance? yes  Organ Function and Labs: [name] Are dose adjustments needed based on the patient's renal function, hepatic function, or hematologic function? No . Are appropriate labs ordered prior to the start of patient's treatment? Yes . Other organ system assessment, if indicated: N/A . The following baseline labs, if indicated, have been ordered: rituximab: baseline Hepatitis B labs  Dose Assessment: . Are the drug doses appropriate? Yes . Are the following correct: o Drug concentrations Yes o IV fluid compatible with drug Yes o Administration routes Yes o Timing of therapy Yes . If applicable, does the patient have documented access for treatment and/or plans for port-a-cath placement? not applicable . If applicable, have lifetime cumulative doses been properly documented and assessed? not applicable Lifetime Dose Tracking  No doses have been documented on this patient for the following tracked chemicals: Doxorubicin, Epirubicin, Idarubicin, Daunorubicin, Mitoxantrone, Bleomycin, Oxaliplatin, Carboplatin, Liposomal Doxorubicin  o   Toxicity Monitoring/Prevention: . The patient has the following take home antiemetics prescribed: N/A . The patient has the following take home medications prescribed: N/A . Medication allergies and previous infusion related reactions, if applicable, have been reviewed and addressed. Yes . The patient's current medication list has been assessed for drug-drug interactions with  their chemotherapy regimen. no significant drug-drug interactions were identified on review.  Order Review: . Are the treatment plan orders signed? Yes . Is the patient scheduled to see a provider prior to their treatment? Yes  I verify that I have reviewed each item in the above checklist and answered each question accordingly.  Vaden Becherer K 04/05/2020 1:41 PM

## 2020-04-09 ENCOUNTER — Inpatient Hospital Stay: Payer: Medicare HMO | Attending: Hematology

## 2020-04-09 ENCOUNTER — Other Ambulatory Visit: Payer: Self-pay

## 2020-04-09 DIAGNOSIS — M7989 Other specified soft tissue disorders: Secondary | ICD-10-CM | POA: Insufficient documentation

## 2020-04-09 DIAGNOSIS — Z5112 Encounter for antineoplastic immunotherapy: Secondary | ICD-10-CM | POA: Insufficient documentation

## 2020-04-09 DIAGNOSIS — Z881 Allergy status to other antibiotic agents status: Secondary | ICD-10-CM | POA: Insufficient documentation

## 2020-04-09 DIAGNOSIS — Z833 Family history of diabetes mellitus: Secondary | ICD-10-CM | POA: Insufficient documentation

## 2020-04-09 DIAGNOSIS — Z7901 Long term (current) use of anticoagulants: Secondary | ICD-10-CM | POA: Insufficient documentation

## 2020-04-09 DIAGNOSIS — Z8249 Family history of ischemic heart disease and other diseases of the circulatory system: Secondary | ICD-10-CM | POA: Insufficient documentation

## 2020-04-09 DIAGNOSIS — Z801 Family history of malignant neoplasm of trachea, bronchus and lung: Secondary | ICD-10-CM | POA: Insufficient documentation

## 2020-04-09 DIAGNOSIS — C884 Extranodal marginal zone B-cell lymphoma of mucosa-associated lymphoid tissue [MALT-lymphoma]: Secondary | ICD-10-CM | POA: Insufficient documentation

## 2020-04-09 DIAGNOSIS — Z87891 Personal history of nicotine dependence: Secondary | ICD-10-CM | POA: Insufficient documentation

## 2020-04-09 DIAGNOSIS — Z87442 Personal history of urinary calculi: Secondary | ICD-10-CM | POA: Insufficient documentation

## 2020-04-09 DIAGNOSIS — I2510 Atherosclerotic heart disease of native coronary artery without angina pectoris: Secondary | ICD-10-CM | POA: Insufficient documentation

## 2020-04-09 DIAGNOSIS — I700 Atherosclerosis of aorta: Secondary | ICD-10-CM | POA: Insufficient documentation

## 2020-04-09 DIAGNOSIS — J209 Acute bronchitis, unspecified: Secondary | ICD-10-CM | POA: Insufficient documentation

## 2020-04-09 DIAGNOSIS — Z79899 Other long term (current) drug therapy: Secondary | ICD-10-CM | POA: Insufficient documentation

## 2020-04-09 DIAGNOSIS — R0602 Shortness of breath: Secondary | ICD-10-CM | POA: Insufficient documentation

## 2020-04-09 DIAGNOSIS — I4891 Unspecified atrial fibrillation: Secondary | ICD-10-CM | POA: Insufficient documentation

## 2020-04-09 DIAGNOSIS — K5900 Constipation, unspecified: Secondary | ICD-10-CM | POA: Insufficient documentation

## 2020-04-09 DIAGNOSIS — Z880 Allergy status to penicillin: Secondary | ICD-10-CM | POA: Insufficient documentation

## 2020-04-09 DIAGNOSIS — M48061 Spinal stenosis, lumbar region without neurogenic claudication: Secondary | ICD-10-CM | POA: Insufficient documentation

## 2020-04-09 DIAGNOSIS — Z800 Family history of malignant neoplasm of digestive organs: Secondary | ICD-10-CM | POA: Insufficient documentation

## 2020-04-09 DIAGNOSIS — D1803 Hemangioma of intra-abdominal structures: Secondary | ICD-10-CM | POA: Insufficient documentation

## 2020-04-09 DIAGNOSIS — I4821 Permanent atrial fibrillation: Secondary | ICD-10-CM | POA: Insufficient documentation

## 2020-04-10 ENCOUNTER — Encounter: Payer: Self-pay | Admitting: Hematology

## 2020-04-10 ENCOUNTER — Other Ambulatory Visit: Payer: Self-pay | Admitting: *Deleted

## 2020-04-10 DIAGNOSIS — C8580 Other specified types of non-Hodgkin lymphoma, unspecified site: Secondary | ICD-10-CM

## 2020-04-10 NOTE — Progress Notes (Signed)
Called pt to introduce myself as his Arboriculturist and to discuss the [employer].  Pt has 2 insurances so copay assistance shouldn't be needed.  Pt would like for me to discuss this with his wife so I gave him my number for her to call me at her earliest convenience.

## 2020-04-11 ENCOUNTER — Inpatient Hospital Stay: Payer: Medicare HMO

## 2020-04-11 ENCOUNTER — Inpatient Hospital Stay (HOSPITAL_BASED_OUTPATIENT_CLINIC_OR_DEPARTMENT_OTHER): Payer: Medicare HMO | Admitting: Hematology

## 2020-04-11 ENCOUNTER — Other Ambulatory Visit: Payer: Self-pay

## 2020-04-11 ENCOUNTER — Other Ambulatory Visit: Payer: Self-pay | Admitting: Cardiovascular Disease

## 2020-04-11 VITALS — BP 106/76 | HR 70 | Temp 97.9°F | Resp 18 | Ht 70.0 in | Wt 183.9 lb

## 2020-04-11 VITALS — BP 122/60 | HR 64 | Temp 98.4°F | Resp 18

## 2020-04-11 DIAGNOSIS — Z881 Allergy status to other antibiotic agents status: Secondary | ICD-10-CM | POA: Diagnosis not present

## 2020-04-11 DIAGNOSIS — C8580 Other specified types of non-Hodgkin lymphoma, unspecified site: Secondary | ICD-10-CM | POA: Diagnosis not present

## 2020-04-11 DIAGNOSIS — Z801 Family history of malignant neoplasm of trachea, bronchus and lung: Secondary | ICD-10-CM | POA: Diagnosis not present

## 2020-04-11 DIAGNOSIS — J209 Acute bronchitis, unspecified: Secondary | ICD-10-CM | POA: Diagnosis not present

## 2020-04-11 DIAGNOSIS — R0602 Shortness of breath: Secondary | ICD-10-CM | POA: Diagnosis not present

## 2020-04-11 DIAGNOSIS — I4821 Permanent atrial fibrillation: Secondary | ICD-10-CM | POA: Diagnosis not present

## 2020-04-11 DIAGNOSIS — Z79899 Other long term (current) drug therapy: Secondary | ICD-10-CM | POA: Diagnosis not present

## 2020-04-11 DIAGNOSIS — Z298 Encounter for other specified prophylactic measures: Secondary | ICD-10-CM

## 2020-04-11 DIAGNOSIS — M7989 Other specified soft tissue disorders: Secondary | ICD-10-CM | POA: Diagnosis not present

## 2020-04-11 DIAGNOSIS — I4891 Unspecified atrial fibrillation: Secondary | ICD-10-CM | POA: Diagnosis not present

## 2020-04-11 DIAGNOSIS — Z8249 Family history of ischemic heart disease and other diseases of the circulatory system: Secondary | ICD-10-CM | POA: Diagnosis not present

## 2020-04-11 DIAGNOSIS — M48061 Spinal stenosis, lumbar region without neurogenic claudication: Secondary | ICD-10-CM | POA: Diagnosis not present

## 2020-04-11 DIAGNOSIS — Z87442 Personal history of urinary calculi: Secondary | ICD-10-CM | POA: Diagnosis not present

## 2020-04-11 DIAGNOSIS — C884 Extranodal marginal zone B-cell lymphoma of mucosa-associated lymphoid tissue [MALT-lymphoma]: Secondary | ICD-10-CM | POA: Diagnosis not present

## 2020-04-11 DIAGNOSIS — I2510 Atherosclerotic heart disease of native coronary artery without angina pectoris: Secondary | ICD-10-CM | POA: Diagnosis not present

## 2020-04-11 DIAGNOSIS — Z5112 Encounter for antineoplastic immunotherapy: Secondary | ICD-10-CM | POA: Diagnosis not present

## 2020-04-11 DIAGNOSIS — I700 Atherosclerosis of aorta: Secondary | ICD-10-CM | POA: Diagnosis not present

## 2020-04-11 DIAGNOSIS — Z800 Family history of malignant neoplasm of digestive organs: Secondary | ICD-10-CM | POA: Diagnosis not present

## 2020-04-11 DIAGNOSIS — K5900 Constipation, unspecified: Secondary | ICD-10-CM | POA: Diagnosis not present

## 2020-04-11 DIAGNOSIS — Z7901 Long term (current) use of anticoagulants: Secondary | ICD-10-CM | POA: Diagnosis not present

## 2020-04-11 DIAGNOSIS — D1803 Hemangioma of intra-abdominal structures: Secondary | ICD-10-CM | POA: Diagnosis not present

## 2020-04-11 DIAGNOSIS — Z7189 Other specified counseling: Secondary | ICD-10-CM

## 2020-04-11 DIAGNOSIS — Z833 Family history of diabetes mellitus: Secondary | ICD-10-CM | POA: Diagnosis not present

## 2020-04-11 DIAGNOSIS — Z880 Allergy status to penicillin: Secondary | ICD-10-CM | POA: Diagnosis not present

## 2020-04-11 DIAGNOSIS — Z87891 Personal history of nicotine dependence: Secondary | ICD-10-CM | POA: Diagnosis not present

## 2020-04-11 LAB — CBC WITH DIFFERENTIAL (CANCER CENTER ONLY)
Abs Immature Granulocytes: 0 10*3/uL (ref 0.00–0.07)
Basophils Absolute: 0 10*3/uL (ref 0.0–0.1)
Basophils Relative: 0 %
Eosinophils Absolute: 0.1 10*3/uL (ref 0.0–0.5)
Eosinophils Relative: 2 %
HCT: 36.5 % — ABNORMAL LOW (ref 39.0–52.0)
Hemoglobin: 12.9 g/dL — ABNORMAL LOW (ref 13.0–17.0)
Immature Granulocytes: 0 %
Lymphocytes Relative: 19 %
Lymphs Abs: 0.4 10*3/uL — ABNORMAL LOW (ref 0.7–4.0)
MCH: 37 pg — ABNORMAL HIGH (ref 26.0–34.0)
MCHC: 35.3 g/dL (ref 30.0–36.0)
MCV: 104.6 fL — ABNORMAL HIGH (ref 80.0–100.0)
Monocytes Absolute: 0.1 10*3/uL (ref 0.1–1.0)
Monocytes Relative: 3 %
Neutro Abs: 1.7 10*3/uL (ref 1.7–7.7)
Neutrophils Relative %: 76 %
Platelet Count: 119 10*3/uL — ABNORMAL LOW (ref 150–400)
RBC: 3.49 MIL/uL — ABNORMAL LOW (ref 4.22–5.81)
RDW: 12.7 % (ref 11.5–15.5)
WBC Count: 2.3 10*3/uL — ABNORMAL LOW (ref 4.0–10.5)
nRBC: 0 % (ref 0.0–0.2)

## 2020-04-11 LAB — CMP (CANCER CENTER ONLY)
ALT: 26 U/L (ref 0–44)
AST: 19 U/L (ref 15–41)
Albumin: 3.9 g/dL (ref 3.5–5.0)
Alkaline Phosphatase: 80 U/L (ref 38–126)
Anion gap: 12 (ref 5–15)
BUN: 20 mg/dL (ref 8–23)
CO2: 24 mmol/L (ref 22–32)
Calcium: 9.1 mg/dL (ref 8.9–10.3)
Chloride: 104 mmol/L (ref 98–111)
Creatinine: 1.21 mg/dL (ref 0.61–1.24)
GFR, Est AFR Am: >60 mL/min (ref >60)
GFR, Estimated: 57 mL/min — ABNORMAL LOW (ref >60)
Glucose, Bld: 180 mg/dL — ABNORMAL HIGH (ref 70–99)
Potassium: 3.6 mmol/L (ref 3.5–5.1)
Sodium: 140 mmol/L (ref 135–145)
Total Bilirubin: 0.9 mg/dL (ref 0.3–1.2)
Total Protein: 6.4 g/dL — ABNORMAL LOW (ref 6.5–8.1)

## 2020-04-11 MED ORDER — DIPHENHYDRAMINE HCL 25 MG PO CAPS
ORAL_CAPSULE | ORAL | Status: AC
  Filled 2020-04-11: qty 2

## 2020-04-11 MED ORDER — FAMOTIDINE IN NACL 20-0.9 MG/50ML-% IV SOLN
INTRAVENOUS | Status: AC
  Filled 2020-04-11: qty 50

## 2020-04-11 MED ORDER — SODIUM CHLORIDE 0.9 % IV SOLN
Freq: Once | INTRAVENOUS | Status: AC
  Filled 2020-04-11: qty 250

## 2020-04-11 MED ORDER — METHYLPREDNISOLONE SODIUM SUCC 125 MG IJ SOLR
125.0000 mg | Freq: Once | INTRAMUSCULAR | Status: AC
  Administered 2020-04-11: 125 mg via INTRAVENOUS

## 2020-04-11 MED ORDER — ACETAMINOPHEN 325 MG PO TABS
650.0000 mg | ORAL_TABLET | Freq: Once | ORAL | Status: AC
  Administered 2020-04-11: 650 mg via ORAL

## 2020-04-11 MED ORDER — FAMOTIDINE IN NACL 20-0.9 MG/50ML-% IV SOLN
20.0000 mg | Freq: Once | INTRAVENOUS | Status: AC
  Administered 2020-04-11: 20 mg via INTRAVENOUS

## 2020-04-11 MED ORDER — SODIUM CHLORIDE 0.9 % IV SOLN
375.0000 mg/m2 | Freq: Once | INTRAVENOUS | Status: AC
  Administered 2020-04-11: 800 mg via INTRAVENOUS
  Filled 2020-04-11: qty 30

## 2020-04-11 MED ORDER — METHYLPREDNISOLONE SODIUM SUCC 125 MG IJ SOLR
INTRAMUSCULAR | Status: AC
  Filled 2020-04-11: qty 2

## 2020-04-11 MED ORDER — ACETAMINOPHEN 325 MG PO TABS
ORAL_TABLET | ORAL | Status: AC
  Filled 2020-04-11: qty 2

## 2020-04-11 MED ORDER — DIPHENHYDRAMINE HCL 25 MG PO CAPS
50.0000 mg | ORAL_CAPSULE | Freq: Once | ORAL | Status: AC
  Administered 2020-04-11: 50 mg via ORAL

## 2020-04-11 NOTE — Progress Notes (Signed)
HEMATOLOGY/ONCOLOGY CLINIC NOTE  Date of Service: 04/11/2020  Patient Care Team: Antony Contras, MD as PCP - General (Family Medicine)  CHIEF COMPLAINTS/PURPOSE OF CONSULTATION:  Extranodal Marginal Zone MALT Lymphoma  HISTORY OF PRESENTING ILLNESS:   James Sweeney is a wonderful 79 y.o. male who has been referred to [name] by Dr Antony Contras for evaluation and management of Extranodal Marginal Zone MALT Lymphoma. He is accompanied today by his wife. The pt reports that he is doing well overall.   The pt notes that about 4-5 years ago his urologist first discovered a small mass in his lung. He had a CT  in June 2016 which showed this mass. A navigation bronchoscopy EBUS was performed in March 2017 that did not reveal a definitive pathology. A repeat bx (VATS  With wedge resection of  RML) biopsy was completed on 04/12/18 which revealed an Extranodal marginal zone lymphoma. He denies any constitutional symptoms. The pt reports that he still has some soreness at his surgical site but denies any difficulty breathing.   He is taking Xarelto for Afib.   He last had pneumonia last summer 2018, and has had pneumonia twice. He maintains regular follow up with his PCP Dr Antony Contras.   Most recent lab results (04/12/18) of CBC  is as follows: all values are WNL except for RBC at 3.44, HGB at 11.8, HCT at 35.3, MCV at 102.6, MCH at 34.3, PLT at 126k.   On review of systems, pt reports good energy levels, chest soreness at surgical site, improving breathing, constipation, and denies fevers, chills, night sweats, unexpected weight loss, abdominal pains, leg swelling, and any other symptoms.   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 He notes having breathed in methanol/ethanol exhaust fumes in his work at times.   Interval History:   CAELLUM MANCIL returns today for management and evaluation of his Extranodal Marginal Zone MALT lymphoma. The patient's last visit with [name]  was on 03/19/2020. The pt reports that he is doing well overall.  The pt reports that he has had no new concerns in the interim. He denies any SOB or cough, but has some pain along his back when he walks occasionally. Pt notes that his rash has resolved after the short course of steroids.   Lab results today (04/11/20) of CBC w/diff and CMP is as follows: all values are WNL except for WBC at 2.3K, RBC at 3.49, Hgb at 12.9, HCT at 36.5, MCV at 104.6, MCH at 37.0, PLT at 119K, Lymphs Abs at 0.4K, Glucose 180, Total Protein at 6.4, GFR Est Non Af Am at 57.  On review of systems, pt reports leg swelling, unexpected weight loss and denies cough, SOB, fever, chills, rash, abdominal pain and any other symptoms.    MEDICAL HISTORY:  Past Medical History:  Diagnosis Date  . Cervical spondylosis without myelopathy 12/01/2013  . Diabetes mellitus    borderline  . Enlarged prostate   . Frequency of urination   . Hearing deficit   . Heart murmur   . History of kidney stones    removed with Lithotripsy  . HOH (hard of hearing)    right ear  . Hyperlipemia   . Hypertension   . Lumbago 12/01/2013  . Pneumonia 2016  . Polio    right leg smaller than left  . Shortness of breath dyspnea    with exertion    SURGICAL HISTORY: Past Surgical History:  Procedure Laterality Date  . CATARACT EXTRACTION Bilateral 2014  . LUNG BIOPSY Right 2018   March  . VIDEO ASSISTED THORACOSCOPY (VATS)/WEDGE RESECTION Right 04/12/2018   Procedure: VIDEO ASSISTED THORACOSCOPY (VATS), WEDGE RESECTION RIGHT MIDDLE LOBE, PLACEMENT OF ON Q CATHETER;  Surgeon: [name], MD;  Location: [place];  Service: Thoracic;  Laterality: Right;  James Sweeney VIDEO BRONCHOSCOPY N/A 04/12/2018   Procedure: VIDEO BRONCHOSCOPY;  Surgeon: [name], MD;  Location: [place];  Service: Thoracic;  Laterality: N/A;  . VIDEO BRONCHOSCOPY WITH ENDOBRONCHIAL NAVIGATION N/A 01/25/2016   Procedure: VIDEO BRONCHOSCOPY WITH ENDOBRONCHIAL  NAVIGATION;  Surgeon: [name], MD;  Location: [place];  Service: Thoracic;  Laterality: N/A;  . VIDEO BRONCHOSCOPY WITH ENDOBRONCHIAL ULTRASOUND N/A 01/25/2016   Procedure: VIDEO BRONCHOSCOPY WITH ENDOBRONCHIAL ULTRASOUND;  Surgeon: [name], MD;  Location: [place];  Service: Thoracic;  Laterality: N/A;    SOCIAL HISTORY: Social History   Socioeconomic History  . Marital status: Married    Spouse name: Not on file  . Number of children: 2  . Years of education: Not on file  . Highest education level: Not on file  Occupational History  . Not on file  Tobacco Use  . Smoking status: Former Smoker    Packs/day: 0.50    Years: 9.00    Pack years: 4.50    Types: Cigarettes    Quit date: 12/02/1967    Years since quitting: 52.3  . Smokeless tobacco: Former Systems developer    Types: Chew    Quit date: 2000  Substance and Sexual Activity  . Alcohol use: No  . Drug use: No  . Sexual activity: Never  Other Topics Concern  . Not on file  Social History Narrative   Patient is right handed.   Patient drinks 1 cup caffeine daily.      Pt has been married for 68 years and has 2 children, 4 grandchildren, and 3 great-grandchildren. Lives with everyone except great-grandchildren. He does not clean house, but can shop, do yard work, and drive. 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         Epworth Sleepiness Scale Score:  8      --I have HTN   --I seem to be losing my sex drive   --I wake up to urinate frequently at night   --I awake feeling not rested   --I have borderline diabetes   Social Determinants of Health   Financial Resource Strain:   . Difficulty of Paying Living Expenses:   Food Insecurity:   . Worried About Charity fundraiser in the Last Year:   . Arboriculturist in the Last Year:   Transportation Needs:   . Film/video editor (Medical):   James Sweeney Lack of Transportation (Non-Medical):   Physical Activity:   . Days of Exercise per Week:     . Minutes of Exercise per Session:   Stress:   . Feeling of Stress :   Social Connections:   . Frequency of Communication with Friends and Family:   . Frequency of Social Gatherings with Friends and Family:   . Attends Religious Services:   . Active Member of Clubs or Organizations:   . Attends Archivist Meetings:   James Sweeney Marital Status:   Intimate Partner Violence:   . Fear of Current or Ex-Partner:   . Emotionally Abused:   James Sweeney Physically Abused:   . Sexually Abused:  FAMILY HISTORY: Family History  Problem Relation Age of Onset  . Cancer Mother        Stomach  . Cancer - Lung Father   . Heart attack Brother   . Diabetes Brother     ALLERGIES:  is allergic to levaquin [levofloxacin in d5w] and penicillins.  MEDICATIONS:  Current Outpatient Medications  Medication Sig Dispense Refill  . alfuzosin (UROXATRAL) 10 MG 24 hr tablet Take 10 mg by mouth at bedtime.     James Sweeney amLODipine (NORVASC) 10 MG tablet Take 10 mg by mouth daily.    . Ascorbic Acid (SUPER C COMPLEX PO) Take 1 capsule by mouth every evening. Super C 1000    . Calcium Carb-Cholecalciferol (CALCIUM 600+D3 PO) Take 1 tablet by mouth every evening.    . Cyanocobalamin (VITAMIN B-12) 5000 MCG SUBL Place 1 tablet under the tongue every evening.     . Ferrous Sulfate (IRON) 28 MG TABS Take 28 mg by mouth every evening.    . finasteride (PROSCAR) 5 MG tablet Take 5 mg by mouth at bedtime.     . hydrochlorothiazide (HYDRODIURIL) 12.5 MG tablet Take 12.5 mg by mouth daily.    . Omega-3 Fatty Acids (FISH OIL) 1200 MG CAPS Take 1,200 mg by mouth every evening.    Vladimir Faster Glycol-Propyl Glycol (SYSTANE) 0.4-0.3 % SOLN Place 1 drop into both eyes 2 (two) times daily as needed (for dry/irritated eyes.).    James Sweeney rivaroxaban (XARELTO) 20 MG TABS tablet TAKE 1 TABLET (20 MG TOTAL) BY MOUTH DAILY WITH SUPPER. (Patient taking differently: Take 20 mg by mouth daily with supper. ) 90 tablet 3  . simvastatin (ZOCOR) 20 MG  tablet Take 20 mg by mouth every evening.    . traMADol (ULTRAM) 50 MG tablet Take 1 tablet (50 mg total) by mouth every 6 (six) hours as needed (pain). (Patient taking differently: Take 25 mg by mouth 2 (two) times daily. ) 28 tablet 0  . Vitamin Mixture (SELENIUM-VITAMIN E) 50-400 MCG-UNIT CAPS Take 1 capsule by mouth every evening.     No current facility-administered medications for this visit.    REVIEW OF SYSTEMS:   A 10+ POINT REVIEW OF SYSTEMS WAS OBTAINED including neurology, dermatology, psychiatry, cardiac, respiratory, lymph, extremities, GI, GU, Musculoskeletal, constitutional, breasts, reproductive, HEENT.  All pertinent positives are noted in the HPI.  All others are negative.   PHYSICAL EXAMINATION: ECOG PERFORMANCE STATUS: 1 - Symptomatic but completely ambulatory  GENERAL:alert, in no acute distress and comfortable, HOH SKIN: no acute rashes, no significant lesions EYES: conjunctiva are pink and non-injected, sclera anicteric OROPHARYNX: MMM, no exudates, no oropharyngeal erythema or ulceration NECK: supple, no JVD LYMPH:  no palpable lymphadenopathy in the cervical, axillary or inguinal regions LUNGS: clear to auscultation b/l with normal respiratory effort HEART: regular rate & rhythm ABDOMEN:  normoactive bowel sounds , non tender, not distended. No palpable hepatosplenomegaly.  Extremity: no pedal edema PSYCH: alert & oriented x 3 with fluent speech NEURO: no focal motor/sensory deficits  LABORATORY DATA:  I have reviewed the data as listed  . CBC Latest Ref Rng & Units 04/11/2020 02/21/2020 11/29/2019  WBC 4.0 - 10.5 K/uL 2.3(L) 2.2(L) 2.4(L)  Hemoglobin 13.0 - 17.0 g/dL 12.9(L) 12.1(L) 11.8(L)  Hematocrit 39.0 - 52.0 % 36.5(L) 35.6(L) 34.7(L)  Platelets 150 - 400 K/uL 119(L) 129(L) 115(L)   ANC 1.6K  . CMP Latest Ref Rng & Units 04/11/2020 02/21/2020 11/29/2019  Glucose 70 - 99 mg/dL 180(H) 187(H) 131(H)  BUN 8 - 23 mg/dL 20 18 15   Creatinine 0.61 - 1.24  mg/dL 1.21 1.27(H) 1.07  Sodium 135 - 145 mmol/L 140 139 141  Potassium 3.5 - 5.1 mmol/L 3.6 4.1 4.0  Chloride 98 - 111 mmol/L 104 103 104  CO2 22 - 32 mmol/L 24 29 28   Calcium 8.9 - 10.3 mg/dL 9.1 9.2 8.9  Total Protein 6.5 - 8.1 g/dL 6.4(L) 7.0 7.0  Total Bilirubin 0.3 - 1.2 mg/dL 0.9 0.8 0.8  Alkaline Phos 38 - 126 U/L 80 92 94  AST 15 - 41 U/L 19 23 22   ALT 0 - 44 U/L 26 34 29   . Lab Results  Component Value Date   LDH 173 02/21/2020    04/12/18 Flow Cytometry:   04/12/18 Lung biopsy:     RADIOGRAPHIC STUDIES: I have personally reviewed the radiological images as listed and agreed with the findings in the report. MR LIVER W WO CONTRAST  Result Date: 03/13/2020 CLINICAL DATA:  Abdominal mass. Lymphoma. Indeterminate liver lesion on prior CT. EXAM: MRI ABDOMEN WITHOUT AND WITH CONTRAST TECHNIQUE: Multiplanar multisequence MR imaging of the abdomen was performed both before and after the administration of intravenous contrast. CONTRAST:  83mL GADAVIST GADOBUTROL 1 MMOL/ML IV SOLN COMPARISON:  PET CT 03/12/2020, abdominal CT 09/17/2020 FINDINGS: Lower chest: Lung bases are clear. Hepatobiliary: Several well-circumscribed hepatic cysts. The hypervascular lesion described on CT 12/18/2018 in segment 2 liver is a subtle on today's MRI scan. Lesion is present as hyperintensity on T2 image 16/series 5. Lesion demonstrates subtle peripheral enhancement on postcontrast T1 weighted imaging (image 31/15). On more delayed imaging lesion becomes isointense to liver parenchyma. Lesion is most consistent with benign hemangioma. Additionally, there is no hypermetabolic activity through this region on comparison PET-CT scan same day. Pancreas: No pancreatic duct dilatation. There is ductal ectasia in the body and tail the pancreas. Spleen: Normal spleen Adrenals/urinary tract: Again demonstrated enlargement of the RIGHT adrenal gland to 19 mm (image 48/series 6). Lesion does not have typical loss of  signal intensity of a adenoma on opposed phase imaging. Lesion is hyperintense on T2 weighted imaging (image 20/22) and demonstrates post-contrast enhancement. On comparison FDG scan same day lesion is hypermetabolic. No suspicious renal lesion. Nonenhancing cyst of the RIGHT kidney. Stomach/Bowel: Stomach, small bowel, appendix, and cecum are normal. The colon and rectosigmoid colon are normal. Vascular/Lymphatic: No retroperitoneal or periportal adenopathy. Other: No free fluid. Musculoskeletal: No aggressive osseous lesion. IMPRESSION: 1. Lesion of concern in segment II of the LEFT hepatic lobe is most consistent with benign hemangioma. No hypermetabolic lesion on comparison FDG PET scan. 2. Enlargement of the RIGHT adrenal gland is indeterminate. Lesion was hypermetabolic on comparison FDG PET scan. 3. No suspicious renal lesions. 4. No lymphadenopathy. Electronically Signed   By: Suzy Bouchard M.D.   On: 03/13/2020 09:58   NM PET Image Restag (PS) Skull Base To Thigh  Result Date: 03/13/2020 CLINICAL DATA:  Subsequent treatment strategy for lymphoma. EXAM: NUCLEAR MEDICINE PET SKULL BASE TO THIGH TECHNIQUE: 9.48 mCi F-18 FDG was injected intravenously. Full-ring PET imaging was performed from the skull base to thigh after the radiotracer. CT data was obtained and used for attenuation correction and anatomic localization. Fasting blood glucose: 106 mg/dl COMPARISON:  07/21/2018 FINDINGS: Mediastinal blood pool activity: SUV max 2.65 Liver activity: SUV max 3.73 NECK: No hypermetabolic lymph nodes in the neck. Incidental CT findings: none CHEST: Subpleural thickening within the anterior right upper lobe measures 4.5 x 1.2 cm  and has an SUV max of 8.06, image 32/8. Previously 4.2 x 1.1 cm with SUV max of 5.5. Mild to moderate increased FDG uptake is associated with calcified mediastinal and hilar lymph nodes: -Index right hilar lymph nodes have an SUV max of 5.83. Previously 5.4. -Index subcarinal lymph  node has an SUV max of 4.57. Previously 5.2. -Left the paravertebral soft tissue medial to the descending thoracic aorta is again identified with SUV max with SUV max of 5.98. Previously 4.2. Superior segment left lower lobe lung nodule measures 1.2 cm and has an SUV max of 3.2, image 22/8. Previous this measured 1.1 cm with SUV max of 1.49. Ill-defined nodule within the central right upper lobe measures 1.5 cm with SUV max of 2.6. Previously this measured 1 cm with SUV max of 1.39. Incidental CT findings: Lad and RCA coronary artery calcifications. Aortic atherosclerosis. ABDOMEN/PELVIS: No abnormal activity within the liver, pancreas, or spleen. Nodule within the right adrenal gland measures 2.6 cm and has an SUV max of 5.4. Previously this measured 2.5 cm within SUV max of 4.3. Incidental CT findings: Aortic atherosclerosis.  No aneurysm. SKELETON: No focal hypermetabolic activity to suggest skeletal metastasis. Incidental CT findings: none IMPRESSION: 1. Interval increase in size and FDG uptake associated with subpleural soft tissue within the anterior right upper lobe compatible with Deauville criteria 4/5 disease. 2. Paravertebral soft tissue within the posterior mediastinum medial to the descending aorta exhibits increased FDG uptake. Deauville criteria 4. 3. Mild increase in size and degree of FDG uptake associated with bilateral pulmonary nodules. 4. Similar mild to moderate increased FDG uptake associated with calcified mediastinal and hilar lymph nodes. This is a nonspecific finding and may reflect changes of granulomatous inflammation or infection. 5. Similar appearance of indeterminate right adrenal nodule which exhibits mild to moderate FDG uptake with SUV max of 5.4 6. No significant FDG uptake within the liver. 7.  Aortic Atherosclerosis (ICD10-I70.0). 8. Coronary artery calcifications. Electronically Signed   By: Kerby Moors M.D.   On: 03/13/2020 09:14    ASSESSMENT & PLAN:  79 y.o. male  with  1. 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  If this is an isolated lesion this would make in Stage IE.  04/12/18 bx/resection of the right middle lobe revealed an extranodal marginal zone lymphoma   07/21/18 PET/CT revealed Interval resection of the RIGHT middle lobe pulmonary nodule. 2. Increase thickening of subpleural tissue in the RIGHT upper lobe with increased metabolic activity. In patient with lymphoma history, cannot exclude atypical thoracic lymphoma. 3. No change in small hypermetabolic mediastinal lymph nodes which are favored reactive. 4. Prostatomegaly with nodularity. 5. Stable RIGHT adrenal mass. Favor benign.  03/11/19 CT Chest revealed "Ill-defined ground-glass and consolidative nodules in both lungs remain stable, except for mild increase in size of 1.6 cm central right perihilar nodule. Consider continued followup by chest CT in 6 months. 2. Stable mild partially-calcified mediastinal and bilateral hilar lymphadenopathy. 3. Stable 2.6 cm right adrenal mass,, likely a benign adenoma. Recommend continued attention on follow-up CT. 4. Aortic and coronary artery atherosclerosis."   09/18/2019 CT Angio Chest/Abd/Pel ([id number]) revealed "1. No acute vascular findings. 2. Trace new right pleural effusion, cause uncertain. 3. Similar sub solid nodules and sub solid right subpleural airspace opacity. In this patient with history of lymphoma, the possibility of atypical pulmonary involvement is not readily excluded. Surveillance imaging of the lung lesions is recommended. 4. Nonspecific 1.8 cm focus of arterial phase enhancement in segment 2  of the liver. There is no prior accentuated metabolic activity in this vicinity on prior PET-CT of 07/21/2018. Possibilities for further workup of this lesion include hepatic protocol MRI with and without contrast, or surveillance on follow up imaging related to the patient's lymphoma. 5. Chronically stable right adrenal mass, likely  benign. 6. Other imaging findings of potential clinical significance: Aortic Atherosclerosis (ICD10-I70.0). Coronary atherosclerosis. Mild cardiomegaly. Calcified mediastinal adenopathy, stable. Postoperative findings in the right upper lobe. Stable small well-defined foci of sclerosis in the right fourth rib. Chronic superior endplate compression at L1. Right foraminal stenosis at L4-5 due to spurring. Lipoma deep to the left gluteus maximus muscle, stable, this mildly abuts the left sciatic nerve. Prostatomegaly."  03/12/2020 PET/CT ([id number]) revealed "1. Interval increase in size and FDG uptake associated with subpleural soft tissue within the anterior right upper lobe compatible with Deauville criteria 4/5 disease. 2. Paravertebral soft tissue within the posterior mediastinum medial to the descending aorta exhibits increased FDG uptake. Deauville criteria 4. 3. Mild increase in size and degree of FDG uptake associated with bilateral pulmonary nodules. 4. Similar mild to moderate increased FDG uptake associated with calcified mediastinal and hilar lymph nodes. This is a nonspecific finding and may reflect changes of granulomatous inflammation or infection. 5. Similar appearance of indeterminate right adrenal nodule which exhibits mild to moderate FDG uptake with SUV max of 5.4 6. No significant FDG uptake within the liver."  03/12/2020 MRI Liver ([id number]) revealed benign hemangioma, no hypermetabolic lesion  2) Acute Bronchitis  PLAN:  -Discussed pt labwork today, 04/11/20; blood counts are stable, blood chemistries are okay -The pt has no prohibitive toxicities from continuing Rituxan at this time.  -Advised pt that we would give one infusion per week for four weeks.  -Recommended that the pt continue to eat well, drink at least 48-64 oz of water each day, and walk 20-30 minutes each day.  -Will see back in 1 week for a toxicity check   2.  Patient Active Problem List   Diagnosis Date  Noted  . Marginal zone lymphoma ([place]) 03/25/2020  . Counseling regarding advance care planning and goals of care 03/25/2020  . Renal insufficiency 04/19/2018  . Chronic anticoagulation 04/19/2018  . Lung mass 04/12/2018  . Permanent atrial fibrillation ([place]) 01/19/2018  . Pneumothorax 01/25/2016  . Pneumothorax after biopsy 01/25/2016  . Coronary artery calcification seen on CT scan 01/21/2016  . Murmur, cardiac 01/21/2016  . Hypercholesterolemia 01/21/2016  . Essential hypertension 01/09/2016  . BPH (benign prostatic hyperplasia) 01/09/2016  . Solitary pulmonary nodule 01/09/2016  . Thrombocytopenia ([place]) 01/09/2016  . Cervical spondylosis without myelopathy 12/01/2013  . Lumbago 12/01/2013  . Diabetes mellitus     FOLLOW UP: F/u for next 3 doses of weekly Rituxan as per orders    The total time spent in the appt was 20 minutes and more than 50% was on counseling and direct patient cares.  All of the patient's questions were answered with apparent satisfaction. The patient knows to call the clinic with any problems, questions or concerns.    Sullivan Lone MD [place] AAHIVMS [facility] Hematology/Oncology Physician [facility]  (Office):       [phone] (Work cell):  [phone] (Fax):           [phone]  04/11/2020 10:22 AM  I, Yevette Edwards, am acting as a scribe for Dr. Sullivan Lone.   .I have reviewed the above documentation for accuracy and completeness, and I agree with the above. Suzan Slick  Juleen China MD

## 2020-04-11 NOTE — Patient Instructions (Signed)
[facility] Discharge Instructions for Patients Receiving Chemotherapy  Today you received the following chemotherapy agents Rituximab (RUXIENCE).  To help prevent nausea and vomiting after your treatment, we encourage you to take your nausea medication as prescribed.  If you develop nausea and vomiting that is not controlled by your nausea medication, call the clinic.   BELOW ARE SYMPTOMS THAT SHOULD BE REPORTED IMMEDIATELY:  *FEVER GREATER THAN 100.5 F  *CHILLS WITH OR WITHOUT FEVER  NAUSEA AND VOMITING THAT IS NOT CONTROLLED WITH YOUR NAUSEA MEDICATION  *UNUSUAL SHORTNESS OF BREATH  *UNUSUAL BRUISING OR BLEEDING  TENDERNESS IN MOUTH AND THROAT WITH OR WITHOUT PRESENCE OF ULCERS  *URINARY PROBLEMS  *BOWEL PROBLEMS  UNUSUAL RASH Items with * indicate a potential emergency and should be followed up as soon as possible.  Feel free to call the clinic should you have any questions or concerns. The clinic phone number is [phone].  Please show the [facility] at check-in to the Emergency Department and triage nurse.  Rituximab injection What is this medicine? RITUXIMAB (ri TUX i mab) is a monoclonal antibody. It is used to treat certain types of cancer like non-Hodgkin lymphoma and chronic lymphocytic leukemia. It is also used to treat rheumatoid arthritis, granulomatosis with polyangiitis (or Wegener's granulomatosis), microscopic polyangiitis, and pemphigus vulgaris. This medicine may be used for other purposes; ask your health care provider or pharmacist if you have questions. COMMON BRAND NAME(S): Rituxan, RUXIENCE What should I tell my health care provider before I take this medicine? They need to know if you have any of these conditions:  heart disease  infection (especially a virus infection such as hepatitis B, chickenpox, cold sores, or herpes)  immune system problems  irregular heartbeat  kidney disease  low blood counts, like low  white cell, platelet, or red cell counts  lung or breathing disease, like asthma  recently received or scheduled to receive a vaccine  an unusual or allergic reaction to rituximab, other medicines, foods, dyes, or preservatives  pregnant or trying to get pregnant  breast-feeding How should I use this medicine? This medicine is for infusion into a vein. It is administered in a hospital or clinic by a specially trained health care professional. A special MedGuide will be given to you by the pharmacist with each prescription and refill. Be sure to read this information carefully each time. Talk to your pediatrician regarding the use of this medicine in children. This medicine is not approved for use in children. Overdosage: If you think you have taken too much of this medicine contact a poison control center or emergency room at once. NOTE: This medicine is only for you. Do not share this medicine with others. What if I miss a dose? It is important not to miss a dose. Call your doctor or health care professional if you are unable to keep an appointment. What may interact with this medicine?  cisplatin  live virus vaccines This list may not describe all possible interactions. Give your health care provider a list of all the medicines, herbs, non-prescription drugs, or dietary supplements you use. Also tell them if you smoke, drink alcohol, or use illegal drugs. Some items may interact with your medicine. What should I watch for while using this medicine? Your condition will be monitored carefully while you are receiving this medicine. You may need blood work done while you are taking this medicine. This medicine can cause serious allergic reactions. To reduce your risk you may  need to take medicine before treatment with this medicine. Take your medicine as directed. In some patients, this medicine may cause a serious brain infection that may cause death. If you have any problems seeing,  thinking, speaking, walking, or standing, tell your healthcare professional right away. If you cannot reach your healthcare professional, urgently seek other source of medical care. Call your doctor or health care professional for advice if you get a fever, chills or sore throat, or other symptoms of a cold or flu. Do not treat yourself. This drug decreases your body's ability to fight infections. Try to avoid being around people who are sick. Do not become pregnant while taking this medicine or for at least 12 months after stopping it. Women should inform their doctor if they wish to become pregnant or think they might be pregnant. There is a potential for serious side effects to an unborn child. Talk to your health care professional or pharmacist for more information. Do not breast-feed an infant while taking this medicine or for at least 6 months after stopping it. What side effects may I notice from receiving this medicine? Side effects that you should report to your doctor or health care professional as soon as possible:  allergic reactions like skin rash, itching or hives; swelling of the face, lips, or tongue  breathing problems  chest pain  changes in vision  diarrhea  headache with fever, neck stiffness, sensitivity to light, nausea, or confusion  fast, irregular heartbeat  loss of memory  low blood counts - this medicine may decrease the number of white blood cells, red blood cells and platelets. You may be at increased risk for infections and bleeding.  mouth sores  problems with balance, talking, or walking  redness, blistering, peeling or loosening of the skin, including inside the mouth  signs of infection - fever or chills, cough, sore throat, pain or difficulty passing urine  signs and symptoms of kidney injury like trouble passing urine or change in the amount of urine  signs and symptoms of liver injury like dark yellow or brown urine; general ill feeling or  flu-like symptoms; light-colored stools; loss of appetite; nausea; right upper belly pain; unusually weak or tired; yellowing of the eyes or skin  signs and symptoms of low blood pressure like dizziness; feeling faint or lightheaded, falls; unusually weak or tired  stomach pain  swelling of the ankles, feet, hands  unusual bleeding or bruising  vomiting Side effects that usually do not require medical attention (report to your doctor or health care professional if they continue or are bothersome):  headache  joint pain  muscle cramps or muscle pain  nausea  tiredness This list may not describe all possible side effects. Call your doctor for medical advice about side effects. You may report side effects to FDA at 1-800-FDA-1088. Where should I keep my medicine? This drug is given in a hospital or clinic and will not be stored at home. NOTE: This sheet is a summary. It may not cover all possible information. If you have questions about this medicine, talk to your doctor, pharmacist, or health care provider.  2020 Elsevier/Gold Standard (2018-12-01 22:01:36)

## 2020-04-12 ENCOUNTER — Telehealth: Payer: Self-pay | Admitting: *Deleted

## 2020-04-12 NOTE — Telephone Encounter (Signed)
41 M 83.4 kg SCr 1.21 (6/21), LOV Croitoru 10/2019  CrCl 58.4

## 2020-04-12 NOTE — Telephone Encounter (Signed)
-----   Message from Georgianne Fick, RN sent at 04/11/2020  4:25 PM EDT ----- Regarding: Dr.Kale's First Time Rituxan Patient received first time Rituxan today and tolerated this well.

## 2020-04-12 NOTE — Telephone Encounter (Signed)
Called to f/u with pt after first time Rituxan.  Spoke with pt's wife & she states that he slept well & is eating & has not noticed any problems.  Reminded to have him call if any concerns.

## 2020-04-16 ENCOUNTER — Other Ambulatory Visit: Payer: Self-pay | Admitting: Hematology

## 2020-04-16 DIAGNOSIS — C8580 Other specified types of non-Hodgkin lymphoma, unspecified site: Secondary | ICD-10-CM

## 2020-04-16 NOTE — Progress Notes (Signed)
HEMATOLOGY/ONCOLOGY CLINIC NOTE  Date of Service: 04/17/2020  Patient Care Team: Antony Contras, MD as PCP - General (Family Medicine)  CHIEF COMPLAINTS/PURPOSE OF CONSULTATION:  Extranodal Marginal Zone MALT Lymphoma  HISTORY OF PRESENTING ILLNESS:   James Sweeney is a wonderful 79 y.o. male who has been referred to [name] by Dr Antony Contras for evaluation and management of Extranodal Marginal Zone MALT Lymphoma. He is accompanied today by his wife. The pt reports that he is doing well overall.   The pt notes that about 4-5 years ago his urologist first discovered a small mass in his lung. He had a CT  in June 2016 which showed this mass. A navigation bronchoscopy EBUS was performed in March 2017 that did not reveal a definitive pathology. A repeat bx (VATS  With wedge resection of  RML) biopsy was completed on 04/12/18 which revealed an Extranodal marginal zone lymphoma. He denies any constitutional symptoms. The pt reports that he still has some soreness at his surgical site but denies any difficulty breathing.   He is taking Xarelto for Afib.   He last had pneumonia last summer 2018, and has had pneumonia twice. He maintains regular follow up with his PCP Dr Antony Contras.   Most recent lab results (04/12/18) of CBC  is as follows: all values are WNL except for RBC at 3.44, HGB at 11.8, HCT at 35.3, MCV at 102.6, MCH at 34.3, PLT at 126k.   On review of systems, pt reports good energy levels, chest soreness at surgical site, improving breathing, constipation, and denies fevers, chills, night sweats, unexpected weight loss, abdominal pains, leg swelling, and any other symptoms.   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 He notes having breathed in methanol/ethanol exhaust fumes in his work at times.   Interval History:  [name] returns today for management and evaluation of his Extranodal Marginal Zone MALT lymphoma. He is here today for C2D1 Rituxan.  The patient's last visit with [name] was on 04/11/2020. The pt reports that he is doing well overall.  The pt reports that he has been able to maintain his regular activity, eat well, and sleep well. Pt has has some shortness of breath, but was experiencing this prior to beginning treatment. He had a rash that has since resolved.   Lab results today (04/17/20) of CBC w/diff and CMP is as follows: all values are WNL except for WBC at 2.9K, RBC at 3.27, Hgb at 12.3, HCT at 35.2, MCV at 107.6, MCH at 37.6, PLT at 122K, Lymphs Abs at 0.2K, Glucose at 224, Calcium at 8.8, Total Protein at 6.2.  On review of systems, pt reports SOB and denies rashes, fevers, low appetite, abdominal pain, leg swelling and any other symptoms.   MEDICAL HISTORY:  Past Medical History:  Diagnosis Date  . Cervical spondylosis without myelopathy 12/01/2013  . Diabetes mellitus    borderline  . Enlarged prostate   . Frequency of urination   . Hearing deficit   . Heart murmur   . History of kidney stones    removed with Lithotripsy  . HOH (hard of hearing)    right ear  . Hyperlipemia   . Hypertension   . Lumbago 12/01/2013  . Pneumonia 2016  . Polio    right leg smaller than left  . Shortness of breath dyspnea    with exertion    SURGICAL HISTORY: Past Surgical History:  Procedure Laterality Date  .  CATARACT EXTRACTION Bilateral 2014  . LUNG BIOPSY Right 2018   March  . VIDEO ASSISTED THORACOSCOPY (VATS)/WEDGE RESECTION Right 04/12/2018   Procedure: VIDEO ASSISTED THORACOSCOPY (VATS), WEDGE RESECTION RIGHT MIDDLE LOBE, PLACEMENT OF ON Q CATHETER;  Surgeon: [name], MD;  Location: [facility];  Service: Thoracic;  Laterality: Right;  [name] VIDEO BRONCHOSCOPY N/A 04/12/2018   Procedure: VIDEO BRONCHOSCOPY;  Surgeon: [name], MD;  Location: [facility];  Service: Thoracic;  Laterality: N/A;  . VIDEO BRONCHOSCOPY WITH ENDOBRONCHIAL NAVIGATION N/A 01/25/2016   Procedure: VIDEO BRONCHOSCOPY WITH ENDOBRONCHIAL  NAVIGATION;  Surgeon: [name], MD;  Location: [facility];  Service: Thoracic;  Laterality: N/A;  . VIDEO BRONCHOSCOPY WITH ENDOBRONCHIAL ULTRASOUND N/A 01/25/2016   Procedure: VIDEO BRONCHOSCOPY WITH ENDOBRONCHIAL ULTRASOUND;  Surgeon: [name], MD;  Location: [facility];  Service: Thoracic;  Laterality: N/A;    SOCIAL HISTORY: Social History   Socioeconomic History  . Marital status: Married    Spouse name: Not on file  . Number of children: 2  . Years of education: Not on file  . Highest education level: Not on file  Occupational History  . Not on file  Tobacco Use  . Smoking status: Former Smoker    Packs/day: 0.50    Years: 9.00    Pack years: 4.50    Types: Cigarettes    Quit date: 12/02/1967    Years since quitting: 52.4  . Smokeless tobacco: Former Systems developer    Types: Chew    Quit date: 2000  Media planner  . Vaping Use: Never used  Substance and Sexual Activity  . Alcohol use: No  . Drug use: No  . Sexual activity: Never  Other Topics Concern  . Not on file  Social History Narrative   Patient is right handed.   Patient drinks 1 cup caffeine daily.      Pt has been married for 70 years and has 2 children, 4 grandchildren, and 3 great-grandchildren. Lives with everyone except great-grandchildren. He does not clean house, but can shop, do yard work, and drive. 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         Epworth Sleepiness Scale Score:  8      --I have HTN   --I seem to be losing my sex drive   --I wake up to urinate frequently at night   --I awake feeling not rested   --I have borderline diabetes   Social Determinants of Health   Financial Resource Strain:   . Difficulty of Paying Living Expenses:   Food Insecurity:   . Worried About Charity fundraiser in the Last Year:   . Arboriculturist in the Last Year:   Transportation Needs:   . Film/video editor (Medical):   [name] Lack of Transportation (Non-Medical):   Physical  Activity:   . Days of Exercise per Week:   . Minutes of Exercise per Session:   Stress:   . Feeling of Stress :   Social Connections:   . Frequency of Communication with Friends and Family:   . Frequency of Social Gatherings with Friends and Family:   . Attends Religious Services:   . Active Member of Clubs or Organizations:   . Attends Archivist Meetings:   [name] Marital Status:   Intimate Partner Violence:   . Fear of Current or Ex-Partner:   . Emotionally Abused:   [name] Physically Abused:   . Sexually  Abused:     FAMILY HISTORY: Family History  Problem Relation Age of Onset  . Cancer Mother        Stomach  . Cancer - Lung Father   . Heart attack Brother   . Diabetes Brother     ALLERGIES:  is allergic to levaquin [levofloxacin in d5w] and penicillins.  MEDICATIONS:  Current Outpatient Medications  Medication Sig Dispense Refill  . alfuzosin (UROXATRAL) 10 MG 24 hr tablet Take 10 mg by mouth at bedtime.     [name] amLODipine (NORVASC) 10 MG tablet Take 10 mg by mouth daily.    [name] amLODipine (NORVASC) 5 MG tablet     . Ascorbic Acid (SUPER C COMPLEX PO) Take 1 capsule by mouth every evening. Super C 1000    . Calcium Carb-Cholecalciferol (CALCIUM 600+D3 PO) Take 1 tablet by mouth every evening.    . Cyanocobalamin (VITAMIN B-12) 5000 MCG SUBL Place 1 tablet under the tongue every evening.     . Ferrous Sulfate (IRON) 28 MG TABS Take 28 mg by mouth every evening.    . finasteride (PROSCAR) 5 MG tablet Take 5 mg by mouth at bedtime.     . hydrochlorothiazide (HYDRODIURIL) 12.5 MG tablet Take 12.5 mg by mouth daily.    . Omega-3 Fatty Acids (FISH OIL) 1200 MG CAPS Take 1,200 mg by mouth every evening.    Vladimir Faster Glycol-Propyl Glycol (SYSTANE) 0.4-0.3 % SOLN Place 1 drop into both eyes 2 (two) times daily as needed (for dry/irritated eyes.).    [name] simvastatin (ZOCOR) 20 MG tablet Take 20 mg by mouth every evening.    . traMADol (ULTRAM) 50 MG tablet Take 1 tablet (50 mg  total) by mouth every 6 (six) hours as needed (pain). (Patient taking differently: Take 25 mg by mouth 2 (two) times daily. ) 28 tablet 0  . Vitamin Mixture (SELENIUM-VITAMIN E) 50-400 MCG-UNIT CAPS Take 1 capsule by mouth every evening.    Alveda Reasons 20 MG TABS tablet TAKE 1 TABLET (20 MG TOTAL) BY MOUTH DAILY WITH SUPPER. 90 tablet 1   No current facility-administered medications for this visit.    REVIEW OF SYSTEMS:   A 10+ POINT REVIEW OF SYSTEMS WAS OBTAINED including neurology, dermatology, psychiatry, cardiac, respiratory, lymph, extremities, GI, GU, Musculoskeletal, constitutional, breasts, reproductive, HEENT.  All pertinent positives are noted in the HPI.  All others are negative.   PHYSICAL EXAMINATION: ECOG PERFORMANCE STATUS: 1 - Symptomatic but completely ambulatory   GENERAL:alert, in no acute distress and comfortable SKIN: no acute rashes, no significant lesions EYES: conjunctiva are pink and non-injected, sclera anicteric OROPHARYNX: MMM, no exudates, no oropharyngeal erythema or ulceration NECK: supple, no JVD LYMPH:  no palpable lymphadenopathy in the cervical, axillary or inguinal regions LUNGS: clear to auscultation b/l with normal respiratory effort HEART: regular rate & rhythm ABDOMEN:  normoactive bowel sounds , non tender, not distended. No palpable hepatosplenomegaly.  Extremity: no pedal edema PSYCH: alert & oriented x 3 with fluent speech NEURO: no focal motor/sensory deficits  LABORATORY DATA:  I have reviewed the data as listed  . CBC Latest Ref Rng & Units 04/17/2020 04/11/2020 02/21/2020  WBC 4.0 - 10.5 K/uL 2.9(L) 2.3(L) 2.2(L)  Hemoglobin 13.0 - 17.0 g/dL 12.3(L) 12.9(L) 12.1(L)  Hematocrit 39 - 52 % 35.2(L) 36.5(L) 35.6(L)  Platelets 150 - 400 K/uL 122(L) 119(L) 129(L)   ANC 1.6K  . CMP Latest Ref Rng & Units 04/17/2020 04/11/2020 02/21/2020  Glucose 70 - 99 mg/dL 224(H) 180(H)  187(H)  BUN 8 - 23 mg/dL 18 20 18   Creatinine 0.61 - 1.24 mg/dL 1.15  1.21 1.27(H)  Sodium 135 - 145 mmol/L 136 140 139  Potassium 3.5 - 5.1 mmol/L 3.8 3.6 4.1  Chloride 98 - 111 mmol/L 104 104 103  CO2 22 - 32 mmol/L 26 24 29   Calcium 8.9 - 10.3 mg/dL 8.8(L) 9.1 9.2  Total Protein 6.5 - 8.1 g/dL 6.2(L) 6.4(L) 7.0  Total Bilirubin 0.3 - 1.2 mg/dL 1.0 0.9 0.8  Alkaline Phos 38 - 126 U/L 81 80 92  AST 15 - 41 U/L 16 19 23   ALT 0 - 44 U/L 30 26 34   . Lab Results  Component Value Date   LDH 173 02/21/2020    04/12/18 Flow Cytometry:   04/12/18 Lung biopsy:     RADIOGRAPHIC STUDIES: I have personally reviewed the radiological images as listed and agreed with the findings in the report. No results found.  ASSESSMENT & PLAN:  79 y.o. male with  1. 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  If this is an isolated lesion this would make in Stage IE.  04/12/18 bx/resection of the right middle lobe revealed an extranodal marginal zone lymphoma   07/21/18 PET/CT revealed Interval resection of the RIGHT middle lobe pulmonary nodule. 2. Increase thickening of subpleural tissue in the RIGHT upper lobe with increased metabolic activity. In patient with lymphoma history, cannot exclude atypical thoracic lymphoma. 3. No change in small hypermetabolic mediastinal lymph nodes which are favored reactive. 4. Prostatomegaly with nodularity. 5. Stable RIGHT adrenal mass. Favor benign.  03/11/19 CT Chest revealed "Ill-defined ground-glass and consolidative nodules in both lungs remain stable, except for mild increase in size of 1.6 cm central right perihilar nodule. Consider continued followup by chest CT in 6 months. 2. Stable mild partially-calcified mediastinal and bilateral hilar lymphadenopathy. 3. Stable 2.6 cm right adrenal mass,, likely a benign adenoma. Recommend continued attention on follow-up CT. 4. Aortic and coronary artery atherosclerosis."   09/18/2019 CT Angio Chest/Abd/Pel ([id number]) revealed "1. No acute vascular findings. 2. Trace new  right pleural effusion, cause uncertain. 3. Similar sub solid nodules and sub solid right subpleural airspace opacity. In this patient with history of lymphoma, the possibility of atypical pulmonary involvement is not readily excluded. Surveillance imaging of the lung lesions is recommended. 4. Nonspecific 1.8 cm focus of arterial phase enhancement in segment 2 of the liver. There is no prior accentuated metabolic activity in this vicinity on prior PET-CT of 07/21/2018. Possibilities for further workup of this lesion include hepatic protocol MRI with and without contrast, or surveillance on follow up imaging related to the patient's lymphoma. 5. Chronically stable right adrenal mass, likely benign. 6. Other imaging findings of potential clinical significance: Aortic Atherosclerosis (ICD10-I70.0). Coronary atherosclerosis. Mild cardiomegaly. Calcified mediastinal adenopathy, stable. Postoperative findings in the right upper lobe. Stable small well-defined foci of sclerosis in the right fourth rib. Chronic superior endplate compression at L1. Right foraminal stenosis at L4-5 due to spurring. Lipoma deep to the left gluteus maximus muscle, stable, this mildly abuts the left sciatic nerve. Prostatomegaly."  03/12/2020 PET/CT ([id number]) revealed "1. Interval increase in size and FDG uptake associated with subpleural soft tissue within the anterior right upper lobe compatible with Deauville criteria 4/5 disease. 2. Paravertebral soft tissue within the posterior mediastinum medial to the descending aorta exhibits increased FDG uptake. Deauville criteria 4. 3. Mild increase in size and degree of FDG uptake associated with bilateral pulmonary nodules. 4. Similar  mild to moderate increased FDG uptake associated with calcified mediastinal and hilar lymph nodes. This is a nonspecific finding and may reflect changes of granulomatous inflammation or infection. 5. Similar appearance of indeterminate right adrenal nodule which  exhibits mild to moderate FDG uptake with SUV max of 5.4 6. No significant FDG uptake within the liver."  03/12/2020 MRI Liver ([id number]) revealed benign hemangioma, no hypermetabolic lesion  2) Acute Bronchitis  PLAN: -Discussed pt labwork today, 04/17/20; PLT & Hgb are stable, WBC has improved, Glucose is high, Calcium is slightly low, other blood chemistries are steady -The pt has no prohibitive toxicities from continuing C2D1 of Rituxan at this time. -Will see back in 2 weeks with labs   2.  Patient Active Problem List   Diagnosis Date Noted  . Marginal zone lymphoma ([place]) 03/25/2020  . Counseling regarding advance care planning and goals of care 03/25/2020  . Renal insufficiency 04/19/2018  . Chronic anticoagulation 04/19/2018  . Lung mass 04/12/2018  . Permanent atrial fibrillation ([facility]) 01/19/2018  . Pneumothorax 01/25/2016  . Pneumothorax after biopsy 01/25/2016  . Coronary artery calcification seen on CT scan 01/21/2016  . Murmur, cardiac 01/21/2016  . Hypercholesterolemia 01/21/2016  . Essential hypertension 01/09/2016  . BPH (benign prostatic hyperplasia) 01/09/2016  . Solitary pulmonary nodule 01/09/2016  . Thrombocytopenia ([place]) 01/09/2016  . Cervical spondylosis without myelopathy 12/01/2013  . Lumbago 12/01/2013  . Diabetes mellitus     FOLLOW UP: F/u as scheduled for C3 and C4 of weekly Rituxan   The total time spent in the appt was 20 minutes and more than 50% was on counseling and direct patient cares.  All of the patient's questions were answered with apparent satisfaction. The patient knows to call the clinic with any problems, questions or concerns.    Sullivan Lone MD [place] AAHIVMS [facility] Hematology/Oncology Physician [facility]  (Office):       [phone] (Work cell):  [phone] (Fax):           [phone]  04/17/2020 9:58 AM  I, Yevette Edwards, am acting as a scribe for Dr. Sullivan Lone.   .I have reviewed the above  documentation for accuracy and completeness, and I agree with the above. Brunetta Genera MD

## 2020-04-17 ENCOUNTER — Other Ambulatory Visit: Payer: Self-pay

## 2020-04-17 ENCOUNTER — Inpatient Hospital Stay (HOSPITAL_BASED_OUTPATIENT_CLINIC_OR_DEPARTMENT_OTHER): Payer: Medicare HMO | Admitting: Hematology

## 2020-04-17 ENCOUNTER — Inpatient Hospital Stay: Payer: Medicare HMO

## 2020-04-17 VITALS — BP 137/74 | HR 75 | Temp 98.1°F | Resp 17 | Ht 70.0 in | Wt 184.9 lb

## 2020-04-17 VITALS — BP 134/60 | HR 59 | Temp 98.6°F | Resp 18

## 2020-04-17 DIAGNOSIS — M48061 Spinal stenosis, lumbar region without neurogenic claudication: Secondary | ICD-10-CM | POA: Diagnosis not present

## 2020-04-17 DIAGNOSIS — C8580 Other specified types of non-Hodgkin lymphoma, unspecified site: Secondary | ICD-10-CM

## 2020-04-17 DIAGNOSIS — M7989 Other specified soft tissue disorders: Secondary | ICD-10-CM | POA: Diagnosis not present

## 2020-04-17 DIAGNOSIS — Z7189 Other specified counseling: Secondary | ICD-10-CM

## 2020-04-17 DIAGNOSIS — R0602 Shortness of breath: Secondary | ICD-10-CM | POA: Diagnosis not present

## 2020-04-17 DIAGNOSIS — Z5112 Encounter for antineoplastic immunotherapy: Secondary | ICD-10-CM | POA: Diagnosis not present

## 2020-04-17 DIAGNOSIS — I700 Atherosclerosis of aorta: Secondary | ICD-10-CM | POA: Diagnosis not present

## 2020-04-17 DIAGNOSIS — I4891 Unspecified atrial fibrillation: Secondary | ICD-10-CM | POA: Diagnosis not present

## 2020-04-17 DIAGNOSIS — I2510 Atherosclerotic heart disease of native coronary artery without angina pectoris: Secondary | ICD-10-CM | POA: Diagnosis not present

## 2020-04-17 DIAGNOSIS — K5900 Constipation, unspecified: Secondary | ICD-10-CM | POA: Diagnosis not present

## 2020-04-17 DIAGNOSIS — C884 Extranodal marginal zone B-cell lymphoma of mucosa-associated lymphoid tissue [MALT-lymphoma]: Secondary | ICD-10-CM | POA: Diagnosis not present

## 2020-04-17 LAB — CBC WITH DIFFERENTIAL/PLATELET
Abs Immature Granulocytes: 0.01 10*3/uL (ref 0.00–0.07)
Basophils Absolute: 0 10*3/uL (ref 0.0–0.1)
Basophils Relative: 0 %
Eosinophils Absolute: 0.1 10*3/uL (ref 0.0–0.5)
Eosinophils Relative: 3 %
HCT: 35.2 % — ABNORMAL LOW (ref 39.0–52.0)
Hemoglobin: 12.3 g/dL — ABNORMAL LOW (ref 13.0–17.0)
Immature Granulocytes: 0 %
Lymphocytes Relative: 8 %
Lymphs Abs: 0.2 10*3/uL — ABNORMAL LOW (ref 0.7–4.0)
MCH: 37.6 pg — ABNORMAL HIGH (ref 26.0–34.0)
MCHC: 34.9 g/dL (ref 30.0–36.0)
MCV: 107.6 fL — ABNORMAL HIGH (ref 80.0–100.0)
Monocytes Absolute: 0.2 10*3/uL (ref 0.1–1.0)
Monocytes Relative: 6 %
Neutro Abs: 2.4 10*3/uL (ref 1.7–7.7)
Neutrophils Relative %: 83 %
Platelets: 122 10*3/uL — ABNORMAL LOW (ref 150–400)
RBC: 3.27 MIL/uL — ABNORMAL LOW (ref 4.22–5.81)
RDW: 13 % (ref 11.5–15.5)
WBC: 2.9 10*3/uL — ABNORMAL LOW (ref 4.0–10.5)
nRBC: 0 % (ref 0.0–0.2)

## 2020-04-17 LAB — CMP (CANCER CENTER ONLY)
ALT: 30 U/L (ref 0–44)
AST: 16 U/L (ref 15–41)
Albumin: 3.7 g/dL (ref 3.5–5.0)
Alkaline Phosphatase: 81 U/L (ref 38–126)
Anion gap: 6 (ref 5–15)
BUN: 18 mg/dL (ref 8–23)
CO2: 26 mmol/L (ref 22–32)
Calcium: 8.8 mg/dL — ABNORMAL LOW (ref 8.9–10.3)
Chloride: 104 mmol/L (ref 98–111)
Creatinine: 1.15 mg/dL (ref 0.61–1.24)
GFR, Est AFR Am: >60 mL/min (ref >60)
GFR, Estimated: >60 mL/min (ref >60)
Glucose, Bld: 224 mg/dL — ABNORMAL HIGH (ref 70–99)
Potassium: 3.8 mmol/L (ref 3.5–5.1)
Sodium: 136 mmol/L (ref 135–145)
Total Bilirubin: 1 mg/dL (ref 0.3–1.2)
Total Protein: 6.2 g/dL — ABNORMAL LOW (ref 6.5–8.1)

## 2020-04-17 MED ORDER — ACETAMINOPHEN 325 MG PO TABS
650.0000 mg | ORAL_TABLET | Freq: Once | ORAL | Status: AC
  Administered 2020-04-17: 650 mg via ORAL

## 2020-04-17 MED ORDER — DIPHENHYDRAMINE HCL 25 MG PO CAPS
50.0000 mg | ORAL_CAPSULE | Freq: Once | ORAL | Status: AC
  Administered 2020-04-17: 50 mg via ORAL

## 2020-04-17 MED ORDER — SODIUM CHLORIDE 0.9 % IV SOLN
375.0000 mg/m2 | Freq: Once | INTRAVENOUS | Status: AC
  Administered 2020-04-17: 800 mg via INTRAVENOUS
  Filled 2020-04-17: qty 50

## 2020-04-17 MED ORDER — METHYLPREDNISOLONE SODIUM SUCC 125 MG IJ SOLR
125.0000 mg | Freq: Once | INTRAMUSCULAR | Status: AC
  Administered 2020-04-17: 125 mg via INTRAVENOUS

## 2020-04-17 MED ORDER — DIPHENHYDRAMINE HCL 25 MG PO CAPS
ORAL_CAPSULE | ORAL | Status: AC
  Filled 2020-04-17: qty 2

## 2020-04-17 MED ORDER — FAMOTIDINE IN NACL 20-0.9 MG/50ML-% IV SOLN
20.0000 mg | Freq: Once | INTRAVENOUS | Status: AC
  Administered 2020-04-17: 20 mg via INTRAVENOUS

## 2020-04-17 MED ORDER — SODIUM CHLORIDE 0.9 % IV SOLN
Freq: Once | INTRAVENOUS | Status: AC
  Filled 2020-04-17: qty 250

## 2020-04-17 MED ORDER — ACETAMINOPHEN 325 MG PO TABS
ORAL_TABLET | ORAL | Status: AC
  Filled 2020-04-17: qty 2

## 2020-04-17 MED ORDER — METHYLPREDNISOLONE SODIUM SUCC 125 MG IJ SOLR
INTRAMUSCULAR | Status: AC
  Filled 2020-04-17: qty 2

## 2020-04-17 MED ORDER — FAMOTIDINE IN NACL 20-0.9 MG/50ML-% IV SOLN
INTRAVENOUS | Status: AC
  Filled 2020-04-17: qty 50

## 2020-04-17 NOTE — Progress Notes (Signed)
04/17/20  Give Rituximab at normal rate today.  Check with MD for cycle #3 for rapid rituximab.  Henreitta Leber, PharmD

## 2020-04-17 NOTE — Patient Instructions (Signed)
[facility] Discharge Instructions for Patients Receiving Chemotherapy  Today you received the following chemotherapy agents: rituximab.  To help prevent nausea and vomiting after your treatment, we encourage you to take your nausea medication as directed.   If you develop nausea and vomiting that is not controlled by your nausea medication, call the clinic.   BELOW ARE SYMPTOMS THAT SHOULD BE REPORTED IMMEDIATELY:  *FEVER GREATER THAN 100.5 F  *CHILLS WITH OR WITHOUT FEVER  NAUSEA AND VOMITING THAT IS NOT CONTROLLED WITH YOUR NAUSEA MEDICATION  *UNUSUAL SHORTNESS OF BREATH  *UNUSUAL BRUISING OR BLEEDING  TENDERNESS IN MOUTH AND THROAT WITH OR WITHOUT PRESENCE OF ULCERS  *URINARY PROBLEMS  *BOWEL PROBLEMS  UNUSUAL RASH Items with * indicate a potential emergency and should be followed up as soon as possible.  Feel free to call the clinic should you have any questions or concerns. The clinic phone number is [phone].  Please show the CHEMO ALERT CARD at check-in to the Emergency Department and triage nurse.   

## 2020-04-19 ENCOUNTER — Ambulatory Visit (INDEPENDENT_AMBULATORY_CARE_PROVIDER_SITE_OTHER): Payer: Medicare HMO | Admitting: Podiatry

## 2020-04-19 ENCOUNTER — Other Ambulatory Visit: Payer: Self-pay

## 2020-04-19 DIAGNOSIS — E1169 Type 2 diabetes mellitus with other specified complication: Secondary | ICD-10-CM

## 2020-04-19 DIAGNOSIS — L84 Corns and callosities: Secondary | ICD-10-CM

## 2020-04-19 DIAGNOSIS — E1151 Type 2 diabetes mellitus with diabetic peripheral angiopathy without gangrene: Secondary | ICD-10-CM

## 2020-04-19 DIAGNOSIS — B351 Tinea unguium: Secondary | ICD-10-CM

## 2020-04-19 NOTE — Progress Notes (Signed)
  Subjective:  Patient ID: James Sweeney, male    DOB: Nov 12, 1940,  MRN: [id number]  Chief Complaint  Patient presents with  . Callouses    Bilateral plantar callous/lesion trim.    79 y.o. male presents with the above complaint. History confirmed with patient.   Objective:  Physical Exam: warm, good capillary refill, no trophic changes or ulcerative lesions, normal DP and reduced PT pulses and normal sensory exam. Onychomycosis bilat. Left Foot: HPK submet 2  Right Foot: HPK submet 2  Assessment:   1. Onychomycosis of multiple toenails with type 2 diabetes mellitus and peripheral angiopathy (HCC)   2. Callus      Plan:  Patient was evaluated and treated and all questions answered.  HPKs 2nd met bilat -Lesions pared x2  Onychomycosis -Nails debrided x10  Return if symptoms worsen or fail to improve.

## 2020-04-20 NOTE — Progress Notes (Signed)
.   Rapid Infusion Rituximab Pharmacist Evaluation  James Sweeney is a 79 y.o. male being treated with rituximab for NHL. This patient may be considered for RIR.   A pharmacist has verified the patient tolerated rituximab infusions per the [facility] standard infusion protocol without grade 3-4 infusion reactions. The treatment plan will be updated to reflect RIR if the patient qualifies per the checklist below:   Age > 81 years old Yes   Clinically significant cardiovascular disease No   Circulating lymphocyte count < 5000/uL prior to cycle two No  Lab Results  Component Value Date   LYMPHSABS 0.2 (L) 04/17/2020    Prior documented grade 3-4 infusion reaction to rituximab No   Prior documented grade 1-2 infusion reaction to rituximab (If YES, Pharmacist will confirm with Physician if patient is still a candidate for RIR) No   Previous rituximab infusion within the past 6 months Yes   Treatment Plan updated orders to reflect RIR Yes    James Sweeney does meet the criteria for Rapid Infusion Rituximab. This patient is going to be switched to rapid infusion rituximab. Confirmed with Dr Irene Limbo ok to proceed with rapid rituximab rate.  Orders updated.  Wynona Neat 04/20/20 1:05 PM

## 2020-04-25 ENCOUNTER — Other Ambulatory Visit: Payer: Self-pay

## 2020-04-25 ENCOUNTER — Inpatient Hospital Stay: Payer: Medicare HMO

## 2020-04-25 VITALS — BP 142/82 | HR 60 | Temp 97.8°F | Resp 18 | Wt 185.2 lb

## 2020-04-25 DIAGNOSIS — C8580 Other specified types of non-Hodgkin lymphoma, unspecified site: Secondary | ICD-10-CM

## 2020-04-25 DIAGNOSIS — R0602 Shortness of breath: Secondary | ICD-10-CM | POA: Diagnosis not present

## 2020-04-25 DIAGNOSIS — I700 Atherosclerosis of aorta: Secondary | ICD-10-CM | POA: Diagnosis not present

## 2020-04-25 DIAGNOSIS — K5900 Constipation, unspecified: Secondary | ICD-10-CM | POA: Diagnosis not present

## 2020-04-25 DIAGNOSIS — C884 Extranodal marginal zone B-cell lymphoma of mucosa-associated lymphoid tissue [MALT-lymphoma]: Secondary | ICD-10-CM | POA: Diagnosis not present

## 2020-04-25 DIAGNOSIS — I4891 Unspecified atrial fibrillation: Secondary | ICD-10-CM | POA: Diagnosis not present

## 2020-04-25 DIAGNOSIS — Z7189 Other specified counseling: Secondary | ICD-10-CM

## 2020-04-25 DIAGNOSIS — Z5112 Encounter for antineoplastic immunotherapy: Secondary | ICD-10-CM | POA: Diagnosis not present

## 2020-04-25 DIAGNOSIS — M48061 Spinal stenosis, lumbar region without neurogenic claudication: Secondary | ICD-10-CM | POA: Diagnosis not present

## 2020-04-25 DIAGNOSIS — M7989 Other specified soft tissue disorders: Secondary | ICD-10-CM | POA: Diagnosis not present

## 2020-04-25 DIAGNOSIS — I2510 Atherosclerotic heart disease of native coronary artery without angina pectoris: Secondary | ICD-10-CM | POA: Diagnosis not present

## 2020-04-25 LAB — CBC WITH DIFFERENTIAL/PLATELET
Abs Immature Granulocytes: 0 10*3/uL (ref 0.00–0.07)
Basophils Absolute: 0 10*3/uL (ref 0.0–0.1)
Basophils Relative: 0 %
Eosinophils Absolute: 0 10*3/uL (ref 0.0–0.5)
Eosinophils Relative: 1 %
HCT: 35 % — ABNORMAL LOW (ref 39.0–52.0)
Hemoglobin: 12.2 g/dL — ABNORMAL LOW (ref 13.0–17.0)
Immature Granulocytes: 0 %
Lymphocytes Relative: 12 %
Lymphs Abs: 0.3 10*3/uL — ABNORMAL LOW (ref 0.7–4.0)
MCH: 37.3 pg — ABNORMAL HIGH (ref 26.0–34.0)
MCHC: 34.9 g/dL (ref 30.0–36.0)
MCV: 107 fL — ABNORMAL HIGH (ref 80.0–100.0)
Monocytes Absolute: 0.1 10*3/uL (ref 0.1–1.0)
Monocytes Relative: 4 %
Neutro Abs: 1.7 10*3/uL (ref 1.7–7.7)
Neutrophils Relative %: 83 %
Platelets: 112 10*3/uL — ABNORMAL LOW (ref 150–400)
RBC: 3.27 MIL/uL — ABNORMAL LOW (ref 4.22–5.81)
RDW: 12.6 % (ref 11.5–15.5)
WBC: 2.1 10*3/uL — ABNORMAL LOW (ref 4.0–10.5)
nRBC: 0 % (ref 0.0–0.2)

## 2020-04-25 LAB — CMP (CANCER CENTER ONLY)
ALT: 49 U/L — ABNORMAL HIGH (ref 0–44)
AST: 26 U/L (ref 15–41)
Albumin: 3.5 g/dL (ref 3.5–5.0)
Alkaline Phosphatase: 101 U/L (ref 38–126)
Anion gap: 8 (ref 5–15)
BUN: 16 mg/dL (ref 8–23)
CO2: 24 mmol/L (ref 22–32)
Calcium: 8.4 mg/dL — ABNORMAL LOW (ref 8.9–10.3)
Chloride: 105 mmol/L (ref 98–111)
Creatinine: 1.19 mg/dL (ref 0.61–1.24)
GFR, Est AFR Am: >60 mL/min (ref >60)
GFR, Estimated: 58 mL/min — ABNORMAL LOW (ref >60)
Glucose, Bld: 221 mg/dL — ABNORMAL HIGH (ref 70–99)
Potassium: 4 mmol/L (ref 3.5–5.1)
Sodium: 137 mmol/L (ref 135–145)
Total Bilirubin: 0.7 mg/dL (ref 0.3–1.2)
Total Protein: 6.2 g/dL — ABNORMAL LOW (ref 6.5–8.1)

## 2020-04-25 MED ORDER — METHYLPREDNISOLONE SODIUM SUCC 125 MG IJ SOLR
INTRAMUSCULAR | Status: AC
  Filled 2020-04-25: qty 2

## 2020-04-25 MED ORDER — DIPHENHYDRAMINE HCL 25 MG PO CAPS
ORAL_CAPSULE | ORAL | Status: AC
  Filled 2020-04-25: qty 2

## 2020-04-25 MED ORDER — FAMOTIDINE IN NACL 20-0.9 MG/50ML-% IV SOLN
20.0000 mg | Freq: Once | INTRAVENOUS | Status: AC
  Administered 2020-04-25: 20 mg via INTRAVENOUS

## 2020-04-25 MED ORDER — FAMOTIDINE IN NACL 20-0.9 MG/50ML-% IV SOLN
INTRAVENOUS | Status: AC
  Filled 2020-04-25: qty 50

## 2020-04-25 MED ORDER — SODIUM CHLORIDE 0.9 % IV SOLN
Freq: Once | INTRAVENOUS | Status: AC
  Filled 2020-04-25: qty 250

## 2020-04-25 MED ORDER — METHYLPREDNISOLONE SODIUM SUCC 125 MG IJ SOLR
125.0000 mg | Freq: Once | INTRAMUSCULAR | Status: AC
  Administered 2020-04-25: 125 mg via INTRAVENOUS

## 2020-04-25 MED ORDER — DIPHENHYDRAMINE HCL 25 MG PO CAPS
50.0000 mg | ORAL_CAPSULE | Freq: Once | ORAL | Status: AC
  Administered 2020-04-25: 50 mg via ORAL

## 2020-04-25 MED ORDER — ACETAMINOPHEN 325 MG PO TABS
650.0000 mg | ORAL_TABLET | Freq: Once | ORAL | Status: AC
  Administered 2020-04-25: 650 mg via ORAL

## 2020-04-25 MED ORDER — SODIUM CHLORIDE 0.9 % IV SOLN
375.0000 mg/m2 | Freq: Once | INTRAVENOUS | Status: AC
  Administered 2020-04-25: 800 mg via INTRAVENOUS
  Filled 2020-04-25: qty 30

## 2020-04-25 MED ORDER — ACETAMINOPHEN 325 MG PO TABS
ORAL_TABLET | ORAL | Status: AC
  Filled 2020-04-25: qty 2

## 2020-04-25 NOTE — Patient Instructions (Signed)
[facility] Discharge Instructions for Patients Receiving Chemotherapy  Today you received the following chemotherapy agents: rituximab.  To help prevent nausea and vomiting after your treatment, we encourage you to take your nausea medication as directed.   If you develop nausea and vomiting that is not controlled by your nausea medication, call the clinic.   BELOW ARE SYMPTOMS THAT SHOULD BE REPORTED IMMEDIATELY:  *FEVER GREATER THAN 100.5 F  *CHILLS WITH OR WITHOUT FEVER  NAUSEA AND VOMITING THAT IS NOT CONTROLLED WITH YOUR NAUSEA MEDICATION  *UNUSUAL SHORTNESS OF BREATH  *UNUSUAL BRUISING OR BLEEDING  TENDERNESS IN MOUTH AND THROAT WITH OR WITHOUT PRESENCE OF ULCERS  *URINARY PROBLEMS  *BOWEL PROBLEMS  UNUSUAL RASH Items with * indicate a potential emergency and should be followed up as soon as possible.  Feel free to call the clinic should you have any questions or concerns. The clinic phone number is [phone].  Please show the CHEMO ALERT CARD at check-in to the Emergency Department and triage nurse.   

## 2020-04-27 ENCOUNTER — Telehealth: Payer: Self-pay | Admitting: *Deleted

## 2020-04-27 ENCOUNTER — Other Ambulatory Visit: Payer: Self-pay | Admitting: Hematology

## 2020-04-27 MED ORDER — ONDANSETRON HCL 8 MG PO TABS: 8.0000 mg | ORAL_TABLET | Freq: Three times a day (TID) | ORAL | 0 refills | Status: DC | PRN

## 2020-04-27 NOTE — Progress Notes (Unsigned)
zofran

## 2020-04-27 NOTE — Telephone Encounter (Signed)
[name] called. [name] nauseated today (had rituxan on 6/23). Not vomiting, just waves of nausea. Dr. Irene Limbo informed. Dr. Irene Limbo prescribed Ondansetron 8 mg q 8 H PRN for nausea. Contacted Ms. Chrissie Noa - No answer. Contacted daughter Sharyn Lull to advise that RX sent to pharmacy - she verbalized understanding. Also encouraged bland foods, crackers, ginger ale (carbonated bev) per Dr.Kale.

## 2020-04-27 NOTE — Telephone Encounter (Signed)
Please review for refill.  

## 2020-05-02 ENCOUNTER — Other Ambulatory Visit: Payer: Self-pay

## 2020-05-02 ENCOUNTER — Inpatient Hospital Stay: Payer: Medicare HMO

## 2020-05-02 ENCOUNTER — Inpatient Hospital Stay (HOSPITAL_BASED_OUTPATIENT_CLINIC_OR_DEPARTMENT_OTHER): Payer: Medicare HMO | Admitting: Hematology

## 2020-05-02 VITALS — BP 153/74 | HR 72 | Temp 97.9°F | Resp 18 | Ht 70.0 in | Wt 184.9 lb

## 2020-05-02 VITALS — BP 146/83 | HR 65 | Temp 98.0°F | Resp 17

## 2020-05-02 DIAGNOSIS — Z298 Encounter for other specified prophylactic measures: Secondary | ICD-10-CM

## 2020-05-02 DIAGNOSIS — C884 Extranodal marginal zone B-cell lymphoma of mucosa-associated lymphoid tissue [MALT-lymphoma]: Secondary | ICD-10-CM | POA: Diagnosis not present

## 2020-05-02 DIAGNOSIS — C8580 Other specified types of non-Hodgkin lymphoma, unspecified site: Secondary | ICD-10-CM

## 2020-05-02 DIAGNOSIS — I700 Atherosclerosis of aorta: Secondary | ICD-10-CM | POA: Diagnosis not present

## 2020-05-02 DIAGNOSIS — I4891 Unspecified atrial fibrillation: Secondary | ICD-10-CM | POA: Diagnosis not present

## 2020-05-02 DIAGNOSIS — Z5112 Encounter for antineoplastic immunotherapy: Secondary | ICD-10-CM | POA: Diagnosis not present

## 2020-05-02 DIAGNOSIS — M48061 Spinal stenosis, lumbar region without neurogenic claudication: Secondary | ICD-10-CM | POA: Diagnosis not present

## 2020-05-02 DIAGNOSIS — M7989 Other specified soft tissue disorders: Secondary | ICD-10-CM | POA: Diagnosis not present

## 2020-05-02 DIAGNOSIS — Z7189 Other specified counseling: Secondary | ICD-10-CM

## 2020-05-02 DIAGNOSIS — I2510 Atherosclerotic heart disease of native coronary artery without angina pectoris: Secondary | ICD-10-CM | POA: Diagnosis not present

## 2020-05-02 DIAGNOSIS — K5900 Constipation, unspecified: Secondary | ICD-10-CM | POA: Diagnosis not present

## 2020-05-02 DIAGNOSIS — R0602 Shortness of breath: Secondary | ICD-10-CM | POA: Diagnosis not present

## 2020-05-02 LAB — CMP (CANCER CENTER ONLY)
ALT: 66 U/L — ABNORMAL HIGH (ref 0–44)
AST: 34 U/L (ref 15–41)
Albumin: 3.3 g/dL — ABNORMAL LOW (ref 3.5–5.0)
Alkaline Phosphatase: 108 U/L (ref 38–126)
Anion gap: 7 (ref 5–15)
BUN: 15 mg/dL (ref 8–23)
CO2: 28 mmol/L (ref 22–32)
Calcium: 8.8 mg/dL — ABNORMAL LOW (ref 8.9–10.3)
Chloride: 104 mmol/L (ref 98–111)
Creatinine: 1.1 mg/dL (ref 0.61–1.24)
GFR, Est AFR Am: >60 mL/min (ref >60)
GFR, Estimated: >60 mL/min (ref >60)
Glucose, Bld: 169 mg/dL — ABNORMAL HIGH (ref 70–99)
Potassium: 4 mmol/L (ref 3.5–5.1)
Sodium: 139 mmol/L (ref 135–145)
Total Bilirubin: 0.6 mg/dL (ref 0.3–1.2)
Total Protein: 6.3 g/dL — ABNORMAL LOW (ref 6.5–8.1)

## 2020-05-02 LAB — CBC WITH DIFFERENTIAL/PLATELET
Abs Immature Granulocytes: 0.01 10*3/uL (ref 0.00–0.07)
Basophils Absolute: 0 10*3/uL (ref 0.0–0.1)
Basophils Relative: 0 %
Eosinophils Absolute: 0.1 10*3/uL (ref 0.0–0.5)
Eosinophils Relative: 2 %
HCT: 35.2 % — ABNORMAL LOW (ref 39.0–52.0)
Hemoglobin: 12.1 g/dL — ABNORMAL LOW (ref 13.0–17.0)
Immature Granulocytes: 0 %
Lymphocytes Relative: 21 %
Lymphs Abs: 0.6 10*3/uL — ABNORMAL LOW (ref 0.7–4.0)
MCH: 37.1 pg — ABNORMAL HIGH (ref 26.0–34.0)
MCHC: 34.4 g/dL (ref 30.0–36.0)
MCV: 108 fL — ABNORMAL HIGH (ref 80.0–100.0)
Monocytes Absolute: 0.2 10*3/uL (ref 0.1–1.0)
Monocytes Relative: 7 %
Neutro Abs: 1.9 10*3/uL (ref 1.7–7.7)
Neutrophils Relative %: 70 %
Platelets: 169 10*3/uL (ref 150–400)
RBC: 3.26 MIL/uL — ABNORMAL LOW (ref 4.22–5.81)
RDW: 12.4 % (ref 11.5–15.5)
WBC: 2.8 10*3/uL — ABNORMAL LOW (ref 4.0–10.5)
nRBC: 0 % (ref 0.0–0.2)

## 2020-05-02 MED ORDER — FAMOTIDINE IN NACL 20-0.9 MG/50ML-% IV SOLN
INTRAVENOUS | Status: AC
  Filled 2020-05-02: qty 50

## 2020-05-02 MED ORDER — DIPHENHYDRAMINE HCL 25 MG PO CAPS
50.0000 mg | ORAL_CAPSULE | Freq: Once | ORAL | Status: AC
  Administered 2020-05-02: 50 mg via ORAL

## 2020-05-02 MED ORDER — METHYLPREDNISOLONE SODIUM SUCC 125 MG IJ SOLR
125.0000 mg | Freq: Once | INTRAMUSCULAR | Status: AC
  Administered 2020-05-02: 125 mg via INTRAVENOUS

## 2020-05-02 MED ORDER — SODIUM CHLORIDE 0.9 % IV SOLN
375.0000 mg/m2 | Freq: Once | INTRAVENOUS | Status: AC
  Administered 2020-05-02: 800 mg via INTRAVENOUS
  Filled 2020-05-02: qty 50

## 2020-05-02 MED ORDER — DIPHENHYDRAMINE HCL 25 MG PO CAPS
ORAL_CAPSULE | ORAL | Status: AC
  Filled 2020-05-02: qty 2

## 2020-05-02 MED ORDER — SODIUM CHLORIDE 0.9 % IV SOLN
Freq: Once | INTRAVENOUS | Status: AC
  Filled 2020-05-02: qty 250

## 2020-05-02 MED ORDER — ACETAMINOPHEN 325 MG PO TABS
ORAL_TABLET | ORAL | Status: AC
  Filled 2020-05-02: qty 2

## 2020-05-02 MED ORDER — METHYLPREDNISOLONE SODIUM SUCC 125 MG IJ SOLR
INTRAMUSCULAR | Status: AC
  Filled 2020-05-02: qty 2

## 2020-05-02 MED ORDER — FAMOTIDINE IN NACL 20-0.9 MG/50ML-% IV SOLN
20.0000 mg | Freq: Once | INTRAVENOUS | Status: AC
  Administered 2020-05-02: 20 mg via INTRAVENOUS

## 2020-05-02 MED ORDER — ACETAMINOPHEN 325 MG PO TABS
650.0000 mg | ORAL_TABLET | Freq: Once | ORAL | Status: AC
  Administered 2020-05-02: 650 mg via ORAL

## 2020-05-02 NOTE — Progress Notes (Signed)
HEMATOLOGY/ONCOLOGY CLINIC NOTE  Date of Service: 05/02/2020  Patient Care Team: James Contras, MD as PCP - General (Family Medicine)  CHIEF COMPLAINTS/PURPOSE OF CONSULTATION:  Extranodal Marginal Zone MALT Lymphoma  HISTORY OF PRESENTING ILLNESS:   James Sweeney is a wonderful 79 y.o. male who has been referred to [name] by Dr James Sweeney for evaluation and management of Extranodal Marginal Zone MALT Lymphoma. He is accompanied today by his wife. The pt reports that he is doing well overall.   The pt notes that about 4-5 years ago his urologist first discovered a small mass in his lung. He had a CT  in June 2016 which showed this mass. A navigation bronchoscopy EBUS was performed in March 2017 that did not reveal a definitive pathology. A repeat bx (VATS  With wedge resection of  RML) biopsy was completed on 04/12/18 which revealed an Extranodal marginal zone lymphoma. He denies any constitutional symptoms. The pt reports that he still has some soreness at his surgical site but denies any difficulty breathing.   He is taking Xarelto for Afib.   He last had pneumonia last summer 2018, and has had pneumonia twice. He maintains regular follow up with his PCP Dr James Sweeney.   Most recent lab results (04/12/18) of CBC  is as follows: all values are WNL except for RBC at 3.44, HGB at 11.8, HCT at 35.3, MCV at 102.6, MCH at 34.3, PLT at 126k.   On review of systems, pt reports good energy levels, chest soreness at surgical site, improving breathing, constipation, and denies fevers, chills, night sweats, unexpected weight loss, abdominal pains, leg swelling, and any other symptoms.   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 He notes having breathed in methanol/ethanol exhaust fumes in his work at times.   Interval History:   James Sweeney returns today for management and evaluation of his Extranodal Marginal Zone MALT lymphoma. He is here today for C4D1  Rituxan. The patient's last visit with [name] was on 04/17/2020. The pt reports that he is doing well overall.  The pt reports that last Thursday he woke in the mornining with chills. He has since been sweating profusely and has to change his shirt multiple times per day. Pt does not check his blood glucose levels at home regularly. He denies any worsening of his breathing. He has been having new lower back pain and has been working in his yard, including mowing the lawn. Pt has been eating and drinking well, but has lost about 10 lbs over the last 9 months.   Lab results today (05/02/20) of CBC w/diff and CMP is as follows: all values are WNL except for WBC at 2.8K, RBC at 3.26, Hgb at 12.1, HCT at 35.2, MCV at 108.0, MCH at 37.1, Lymphs Abs at 0.6K, Glucose at 169, Calcium at 8.8, Total Protein at 6.3, Albumin at 3.3, ALT at 66.  On review of systems, pt reports chills, bruising, lower back pain, excessive sweating, unexpected weight loss and denies SOB, low appetite and any other symptoms.    MEDICAL HISTORY:  Past Medical History:  Diagnosis Date  . Cervical spondylosis without myelopathy 12/01/2013  . Diabetes mellitus    borderline  . Enlarged prostate   . Frequency of urination   . Hearing deficit   . Heart murmur   . History of kidney stones    removed with Lithotripsy  . HOH (hard of hearing)  right ear  . Hyperlipemia   . Hypertension   . Lumbago 12/01/2013  . Pneumonia 2016  . Polio    right leg smaller than left  . Shortness of breath dyspnea    with exertion    SURGICAL HISTORY: Past Surgical History:  Procedure Laterality Date  . CATARACT EXTRACTION Bilateral 2014  . LUNG BIOPSY Right 2018   March  . VIDEO ASSISTED THORACOSCOPY (VATS)/WEDGE RESECTION Right 04/12/2018   Procedure: VIDEO ASSISTED THORACOSCOPY (VATS), WEDGE RESECTION RIGHT MIDDLE LOBE, PLACEMENT OF ON Q CATHETER;  Surgeon: [name], MD;  Location: [place];  Service: Thoracic;  Laterality: Right;    [name] VIDEO BRONCHOSCOPY N/A 04/12/2018   Procedure: VIDEO BRONCHOSCOPY;  Surgeon: [name], MD;  Location: [place];  Service: Thoracic;  Laterality: N/A;  . VIDEO BRONCHOSCOPY WITH ENDOBRONCHIAL NAVIGATION N/A 01/25/2016   Procedure: VIDEO BRONCHOSCOPY WITH ENDOBRONCHIAL NAVIGATION;  Surgeon: [name], MD;  Location: [place];  Service: Thoracic;  Laterality: N/A;  . VIDEO BRONCHOSCOPY WITH ENDOBRONCHIAL ULTRASOUND N/A 01/25/2016   Procedure: VIDEO BRONCHOSCOPY WITH ENDOBRONCHIAL ULTRASOUND;  Surgeon: [name], MD;  Location: [place];  Service: Thoracic;  Laterality: N/A;    SOCIAL HISTORY: Social History   Socioeconomic History  . Marital status: Married    Spouse name: Not on file  . Number of children: 2  . Years of education: Not on file  . Highest education level: Not on file  Occupational History  . Not on file  Tobacco Use  . Smoking status: Former Smoker    Packs/day: 0.50    Years: 9.00    Pack years: 4.50    Types: Cigarettes    Quit date: 12/02/1967    Years since quitting: 52.4  . Smokeless tobacco: Former Systems developer    Types: Chew    Quit date: 2000  Media planner  . Vaping Use: Never used  Substance and Sexual Activity  . Alcohol use: No  . Drug use: No  . Sexual activity: Never  Other Topics Concern  . Not on file  Social History Narrative   Patient is right handed.   Patient drinks 1 cup caffeine daily.      Pt has been married for 30 years and has 2 children, 4 grandchildren, and 3 great-grandchildren. Lives with everyone except great-grandchildren. He does not clean house, but can shop, do yard work, and drive. 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         Epworth Sleepiness Scale Score:  8      --I have HTN   --I seem to be losing my sex drive   --I wake up to urinate frequently at night   --I awake feeling not rested   --I have borderline diabetes   Social Determinants of Health   Financial Resource Strain:    . Difficulty of Paying Living Expenses:   Food Insecurity:   . Worried About Charity fundraiser in the Last Year:   . Arboriculturist in the Last Year:   Transportation Needs:   . Film/video editor (Medical):   [name] Lack of Transportation (Non-Medical):   Physical Activity:   . Days of Exercise per Week:   . Minutes of Exercise per Session:   Stress:   . Feeling of Stress :   Social Connections:   . Frequency of Communication with Friends and Family:   . Frequency of Social Gatherings with Friends and Family:   .  Attends Religious Services:   . Active Member of Clubs or Organizations:   . Attends Archivist Meetings:   [name] Marital Status:   Intimate Partner Violence:   . Fear of Current or Ex-Partner:   . Emotionally Abused:   [name] Physically Abused:   . Sexually Abused:     FAMILY HISTORY: Family History  Problem Relation Age of Onset  . Cancer Mother        Stomach  . Cancer - Lung Father   . Heart attack Brother   . Diabetes Brother     ALLERGIES:  is allergic to levaquin [levofloxacin in d5w] and penicillins.  MEDICATIONS:  Current Outpatient Medications  Medication Sig Dispense Refill  . alfuzosin (UROXATRAL) 10 MG 24 hr tablet Take 10 mg by mouth at bedtime.     [name] amLODipine (NORVASC) 10 MG tablet Take 10 mg by mouth daily.    [name] amLODipine (NORVASC) 5 MG tablet     . Ascorbic Acid (SUPER C COMPLEX PO) Take 1 capsule by mouth every evening. Super C 1000    . Calcium Carb-Cholecalciferol (CALCIUM 600+D3 PO) Take 1 tablet by mouth every evening.    . Cyanocobalamin (VITAMIN B-12) 5000 MCG SUBL Place 1 tablet under the tongue every evening.     . Ferrous Sulfate (IRON) 28 MG TABS Take 28 mg by mouth every evening.    . finasteride (PROSCAR) 5 MG tablet Take 5 mg by mouth at bedtime.     . hydrochlorothiazide (HYDRODIURIL) 12.5 MG tablet Take 12.5 mg by mouth daily.    . Omega-3 Fatty Acids (FISH OIL) 1200 MG CAPS Take 1,200 mg by mouth every evening.     . ondansetron (ZOFRAN) 8 MG tablet Take 1 tablet (8 mg total) by mouth every 8 (eight) hours as needed for nausea. 20 tablet 0  . Polyethyl Glycol-Propyl Glycol (SYSTANE) 0.4-0.3 % SOLN Place 1 drop into both eyes 2 (two) times daily as needed (for dry/irritated eyes.).    [name] simvastatin (ZOCOR) 20 MG tablet Take 20 mg by mouth every evening.    . traMADol (ULTRAM) 50 MG tablet Take 1 tablet (50 mg total) by mouth every 6 (six) hours as needed (pain). (Patient taking differently: Take 25 mg by mouth 2 (two) times daily. ) 28 tablet 0  . triamcinolone ointment (KENALOG) 0.5 % APPLY TO ITCHY RASH ON EXTREMITIES AND WAIST TWICE A DAY FOR 15 DAYS. DO NOT APPLY TO FACE 30 g 0  . Vitamin Mixture (SELENIUM-VITAMIN E) 50-400 MCG-UNIT CAPS Take 1 capsule by mouth every evening.    Alveda Reasons 20 MG TABS tablet TAKE 1 TABLET (20 MG TOTAL) BY MOUTH DAILY WITH SUPPER. 90 tablet 1   No current facility-administered medications for this visit.    REVIEW OF SYSTEMS:   A 10+ POINT REVIEW OF SYSTEMS WAS OBTAINED including neurology, dermatology, psychiatry, cardiac, respiratory, lymph, extremities, GI, GU, Musculoskeletal, constitutional, breasts, reproductive, HEENT.  All pertinent positives are noted in the HPI.  All others are negative.   PHYSICAL EXAMINATION: ECOG PERFORMANCE STATUS: 1 - Symptomatic but completely ambulatory   Exam was given in a chair   GENERAL:alert, in no acute distress and comfortable, HOH SKIN: no acute rashes, no significant lesions EYES: conjunctiva are pink and non-injected, sclera anicteric OROPHARYNX: MMM, no exudates, no oropharyngeal erythema or ulceration NECK: supple, no JVD LYMPH:  no palpable lymphadenopathy in the cervical, axillary or inguinal regions LUNGS: clear to auscultation b/l with normal respiratory effort HEART: regular  rate & rhythm ABDOMEN:  normoactive bowel sounds , non tender, not distended. No palpable hepatosplenomegaly.  Extremity: no pedal  edema PSYCH: alert & oriented x 3 with fluent speech NEURO: no focal motor/sensory deficits  LABORATORY DATA:  I have reviewed the data as listed  . CBC Latest Ref Rng & Units 05/02/2020 04/25/2020 04/17/2020  WBC 4.0 - 10.5 K/uL 2.8(L) 2.1(L) 2.9(L)  Hemoglobin 13.0 - 17.0 g/dL 12.1(L) 12.2(L) 12.3(L)  Hematocrit 39 - 52 % 35.2(L) 35.0(L) 35.2(L)  Platelets 150 - 400 K/uL 169 112(L) 122(L)   ANC 1.6K  . CMP Latest Ref Rng & Units 05/02/2020 04/25/2020 04/17/2020  Glucose 70 - 99 mg/dL 169(H) 221(H) 224(H)  BUN 8 - 23 mg/dL 15 16 18   Creatinine 0.61 - 1.24 mg/dL 1.10 1.19 1.15  Sodium 135 - 145 mmol/L 139 137 136  Potassium 3.5 - 5.1 mmol/L 4.0 4.0 3.8  Chloride 98 - 111 mmol/L 104 105 104  CO2 22 - 32 mmol/L 28 24 26   Calcium 8.9 - 10.3 mg/dL 8.8(L) 8.4(L) 8.8(L)  Total Protein 6.5 - 8.1 g/dL 6.3(L) 6.2(L) 6.2(L)  Total Bilirubin 0.3 - 1.2 mg/dL 0.6 0.7 1.0  Alkaline Phos 38 - 126 U/L 108 101 81  AST 15 - 41 U/L 34 26 16  ALT 0 - 44 U/L 66(H) 49(H) 30   . Lab Results  Component Value Date   LDH 173 02/21/2020    04/12/18 Flow Cytometry:   04/12/18 Lung biopsy:     RADIOGRAPHIC STUDIES: I have personally reviewed the radiological images as listed and agreed with the findings in the report. No results found.  ASSESSMENT & PLAN:  79 y.o. male with  1. 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  If this is an isolated lesion this would make in Stage IE.  04/12/18 bx/resection of the right middle lobe revealed an extranodal marginal zone lymphoma   07/21/18 PET/CT revealed Interval resection of the RIGHT middle lobe pulmonary nodule. 2. Increase thickening of subpleural tissue in the RIGHT upper lobe with increased metabolic activity. In patient with lymphoma history, cannot exclude atypical thoracic lymphoma. 3. No change in small hypermetabolic mediastinal lymph nodes which are favored reactive. 4. Prostatomegaly with nodularity. 5. Stable RIGHT adrenal  mass. Favor benign.  03/11/19 CT Chest revealed "Ill-defined ground-glass and consolidative nodules in both lungs remain stable, except for mild increase in size of 1.6 cm central right perihilar nodule. Consider continued followup by chest CT in 6 months. 2. Stable mild partially-calcified mediastinal and bilateral hilar lymphadenopathy. 3. Stable 2.6 cm right adrenal mass,, likely a benign adenoma. Recommend continued attention on follow-up CT. 4. Aortic and coronary artery atherosclerosis."   09/18/2019 CT Angio Chest/Abd/Pel ([id number]) revealed "1. No acute vascular findings. 2. Trace new right pleural effusion, cause uncertain. 3. Similar sub solid nodules and sub solid right subpleural airspace opacity. In this patient with history of lymphoma, the possibility of atypical pulmonary involvement is not readily excluded. Surveillance imaging of the lung lesions is recommended. 4. Nonspecific 1.8 cm focus of arterial phase enhancement in segment 2 of the liver. There is no prior accentuated metabolic activity in this vicinity on prior PET-CT of 07/21/2018. Possibilities for further workup of this lesion include hepatic protocol MRI with and without contrast, or surveillance on follow up imaging related to the patient's lymphoma. 5. Chronically stable right adrenal mass, likely benign. 6. Other imaging findings of potential clinical significance: Aortic Atherosclerosis (ICD10-I70.0). Coronary atherosclerosis. Mild cardiomegaly. Calcified mediastinal adenopathy, stable.  Postoperative findings in the right upper lobe. Stable small well-defined foci of sclerosis in the right fourth rib. Chronic superior endplate compression at L1. Right foraminal stenosis at L4-5 due to spurring. Lipoma deep to the left gluteus maximus muscle, stable, this mildly abuts the left sciatic nerve. Prostatomegaly."  03/12/2020 PET/CT ([id number]) revealed "1. Interval increase in size and FDG uptake associated with subpleural soft  tissue within the anterior right upper lobe compatible with Deauville criteria 4/5 disease. 2. Paravertebral soft tissue within the posterior mediastinum medial to the descending aorta exhibits increased FDG uptake. Deauville criteria 4. 3. Mild increase in size and degree of FDG uptake associated with bilateral pulmonary nodules. 4. Similar mild to moderate increased FDG uptake associated with calcified mediastinal and hilar lymph nodes. This is a nonspecific finding and may reflect changes of granulomatous inflammation or infection. 5. Similar appearance of indeterminate right adrenal nodule which exhibits mild to moderate FDG uptake with SUV max of 5.4 6. No significant FDG uptake within the liver."  03/12/2020 MRI Liver ([id number]) revealed benign hemangioma, no hypermetabolic lesion  2) Acute Bronchitis  PLAN:  -Discussed pt labwork today, 05/02/20; WBC & Hgb are stable, PLT have normalized, blood chemistries are steady, mild elevation of ALT -The pt has no prohibitive toxicities from continuing C4D1 of Rituxan at this time. This is the last planned cycle.  -Advised pt that steroids given with treatment can contribute to excessive sweating. Should resolve after treatment.  -Advised pt that increased bruising is due to medication, as well as senile purpura.  -Advised pt to avoid pushing, pulling, or lifting anything heavy.  -Will get PET/CT in 10 weeks  -Will see back in 12 weeks with labs   2.  Patient Active Problem List   Diagnosis Date Noted  . Marginal zone lymphoma ([place]) 03/25/2020  . Counseling regarding advance care planning and goals of care 03/25/2020  . Renal insufficiency 04/19/2018  . Chronic anticoagulation 04/19/2018  . Lung mass 04/12/2018  . Permanent atrial fibrillation ([place]) 01/19/2018  . Pneumothorax 01/25/2016  . Pneumothorax after biopsy 01/25/2016  . Coronary artery calcification seen on CT scan 01/21/2016  . Murmur, cardiac 01/21/2016  . Hypercholesterolemia  01/21/2016  . Essential hypertension 01/09/2016  . BPH (benign prostatic hyperplasia) 01/09/2016  . Solitary pulmonary nodule 01/09/2016  . Thrombocytopenia ([place]) 01/09/2016  . Cervical spondylosis without myelopathy 12/01/2013  . Lumbago 12/01/2013  . Diabetes mellitus     FOLLOW UP: PET/CT in 10 weeks RTC with Dr Irene Limbo with labs in 12 weeks    The total time spent in the appt was 20 minutes and more than 50% was on counseling and direct patient cares.  All of the patient's questions were answered with apparent satisfaction. The patient knows to call the clinic with any problems, questions or concerns.    Sullivan Lone MD [place] AAHIVMS [facility] Hematology/Oncology Physician [facility]  (Office):       [phone] (Work cell):  [phone] (Fax):           [phone]  05/02/2020 11:11 AM  I, Yevette Edwards, am acting as a scribe for Dr. Sullivan Lone.   .I have reviewed the above documentation for accuracy and completeness, and I agree with the above. Brunetta Genera MD

## 2020-05-08 ENCOUNTER — Telehealth: Payer: Self-pay | Admitting: *Deleted

## 2020-05-08 MED ORDER — HYDROCORTISONE ACETATE 25 MG RE SUPP
25.0000 mg | Freq: Two times a day (BID) | RECTAL | 0 refills | Status: DC
Start: 2020-05-08 — End: 2023-01-03

## 2020-05-08 NOTE — Telephone Encounter (Signed)
Dr Irene Limbo states to use OTC suppository.

## 2020-05-08 NOTE — Telephone Encounter (Signed)
Wife states James Sweeney has developed hemorrhoids. Is requesting a prescription for Hemril HC 25 mg suppositories. States they have helped in the past with pain

## 2020-05-09 ENCOUNTER — Other Ambulatory Visit: Payer: Self-pay | Admitting: Hematology

## 2020-05-09 NOTE — Telephone Encounter (Signed)
Last ordered 04/30/20 - please review for refill

## 2020-05-16 NOTE — Progress Notes (Signed)
  Subjective:  Patient ID: James Sweeney, male    DOB: April 19, 1941,  MRN: [id number]  Chief Complaint  Patient presents with  . Foot Injury    Pt states stepped on a nail 1 year ago, developed painful lesions 1 month ago, plantar forefoot right.    79 y.o. male presents with the above complaint. History confirmed with patient.   Objective:  Physical Exam: warm, good capillary refill, no trophic changes or ulcerative lesions, normal DP and PT pulses and normal sensory exam. HPK submet 2 bilat Left Foot: normal exam, no swelling, tenderness, instability; ligaments intact, full range of motion of all ankle/foot joints  Right Foot: normal exam, no swelling, tenderness, instability; ligaments intact, full range of motion of all ankle/foot joints   No images are attached to the encounter.  Assessment:   1. Residual foreign body in soft tissue   2. Metatarsal deformity, left   3. Deformity of metatarsal, right     Plan:  Patient was evaluated and treated and all questions answered.  Capsulitis, Metatarsal deformity. -Prior XR reviewed. No residual foreign body -Educted that the lesions are biomechanical and likely not 2/2 to stepping on a nail. -Lesions x2 debrided.  No follow-ups on file.

## 2020-06-21 DIAGNOSIS — H9193 Unspecified hearing loss, bilateral: Secondary | ICD-10-CM | POA: Diagnosis not present

## 2020-06-21 DIAGNOSIS — H6122 Impacted cerumen, left ear: Secondary | ICD-10-CM | POA: Diagnosis not present

## 2020-06-21 DIAGNOSIS — H938X1 Other specified disorders of right ear: Secondary | ICD-10-CM | POA: Diagnosis not present

## 2020-06-21 DIAGNOSIS — Z8669 Personal history of other diseases of the nervous system and sense organs: Secondary | ICD-10-CM | POA: Insufficient documentation

## 2020-06-24 ENCOUNTER — Other Ambulatory Visit: Payer: Self-pay | Admitting: Hematology

## 2020-07-11 ENCOUNTER — Ambulatory Visit (HOSPITAL_COMMUNITY): Admission: RE | Admit: 2020-07-11 | Payer: Medicare HMO | Source: Ambulatory Visit

## 2020-07-13 ENCOUNTER — Other Ambulatory Visit: Payer: Self-pay | Admitting: Hematology

## 2020-07-18 DIAGNOSIS — Z23 Encounter for immunization: Secondary | ICD-10-CM | POA: Diagnosis not present

## 2020-07-24 NOTE — Progress Notes (Signed)
HEMATOLOGY/ONCOLOGY CLINIC NOTE  Date of Service: 07/25/2020  Patient Care Team: Antony Contras, MD as PCP - General (Family Medicine)  CHIEF COMPLAINTS/PURPOSE OF CONSULTATION:  Extranodal Marginal Zone MALT Lymphoma  HISTORY OF PRESENTING ILLNESS:   James Sweeney is a wonderful 79 y.o. male who has been referred to [name] by Dr Antony Contras for evaluation and management of Extranodal Marginal Zone MALT Lymphoma. He is accompanied today by his wife. The pt reports that he is doing well overall.   The pt notes that about 4-5 years ago his urologist first discovered a small mass in his lung. He had a CT  in June 2016 which showed this mass. A navigation bronchoscopy EBUS was performed in March 2017 that did not reveal a definitive pathology. A repeat bx (VATS  With wedge resection of  RML) biopsy was completed on 04/12/18 which revealed an Extranodal marginal zone lymphoma. He denies any constitutional symptoms. The pt reports that he still has some soreness at his surgical site but denies any difficulty breathing.   He is taking Xarelto for Afib.   He last had pneumonia last summer 2018, and has had pneumonia twice. He maintains regular follow up with his PCP Dr Antony Contras.   Most recent lab results (04/12/18) of CBC  is as follows: all values are WNL except for RBC at 3.44, HGB at 11.8, HCT at 35.3, MCV at 102.6, MCH at 34.3, PLT at 126k.   On review of systems, pt reports good energy levels, chest soreness at surgical site, improving breathing, constipation, and denies fevers, chills, night sweats, unexpected weight loss, abdominal pains, leg swelling, and any other symptoms.   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 He notes having breathed in methanol/ethanol exhaust fumes in his work at times.   Interval History:  James Sweeney returns today for management and evaluation of his Extranodal Marginal Zone MALT lymphoma. We are joined today by his wife.  The patient's last visit with [name] was on 05/02/2020. The pt reports that he is doing well overall.  The pt reports that he has been experiencing pain at the site of incision for his lung biopsy. His breathing has been okay. He denies any shortness of breath or chest pain. Pt continues being hard of hearing. He recently had earwax buildup removed in his left ear, which has improved hearing some. Pt has hearing aids, but does not use them as they are not helpful. Pt has been seen by an ENT. He has an appointment with his PCP in three weeks. He had a rash around his midsection that resolved after using Kenalog cream.   Lab results today (07/25/20) of CBC w/diff and CMP is as follows: all values are WNL except for WBC at 3.4K, RBC at 3.97, MCV at 101.8, MCH at 35.0, PLT at 131K, Lymphs Abs at 0.4K, Glucose at 188, Anion gap at 4. 07/25/2020 LDH at 192  On review of systems, pt reports chest discomfort at surgical site, impaired hearing and denies SOB, angina, fevers, chills, unexpected weight loss, rash, abdominal pain and any other symptoms.   MEDICAL HISTORY:  Past Medical History:  Diagnosis Date  . Cervical spondylosis without myelopathy 12/01/2013  . Diabetes mellitus    borderline  . Enlarged prostate   . Frequency of urination   . Hearing deficit   . Heart murmur   . History of kidney stones    removed with Lithotripsy  .  HOH (hard of hearing)    right ear  . Hyperlipemia   . Hypertension   . Lumbago 12/01/2013  . Pneumonia 2016  . Polio    right leg smaller than left  . Shortness of breath dyspnea    with exertion    SURGICAL HISTORY: Past Surgical History:  Procedure Laterality Date  . CATARACT EXTRACTION Bilateral 2014  . LUNG BIOPSY Right 2018   March  . VIDEO ASSISTED THORACOSCOPY (VATS)/WEDGE RESECTION Right 04/12/2018   Procedure: VIDEO ASSISTED THORACOSCOPY (VATS), WEDGE RESECTION RIGHT MIDDLE LOBE, PLACEMENT OF ON Q CATHETER;  Surgeon: [name], MD;   Location: [facility];  Service: Thoracic;  Laterality: Right;  [name] VIDEO BRONCHOSCOPY N/A 04/12/2018   Procedure: VIDEO BRONCHOSCOPY;  Surgeon: [name], MD;  Location: [place];  Service: Thoracic;  Laterality: N/A;  . VIDEO BRONCHOSCOPY WITH ENDOBRONCHIAL NAVIGATION N/A 01/25/2016   Procedure: VIDEO BRONCHOSCOPY WITH ENDOBRONCHIAL NAVIGATION;  Surgeon: [name], MD;  Location: [place];  Service: Thoracic;  Laterality: N/A;  . VIDEO BRONCHOSCOPY WITH ENDOBRONCHIAL ULTRASOUND N/A 01/25/2016   Procedure: VIDEO BRONCHOSCOPY WITH ENDOBRONCHIAL ULTRASOUND;  Surgeon: [name], MD;  Location: [place];  Service: Thoracic;  Laterality: N/A;    SOCIAL HISTORY: Social History   Socioeconomic History  . Marital status: Married    Spouse name: Not on file  . Number of children: 2  . Years of education: Not on file  . Highest education level: Not on file  Occupational History  . Not on file  Tobacco Use  . Smoking status: Former Smoker    Packs/day: 0.50    Years: 9.00    Pack years: 4.50    Types: Cigarettes    Quit date: 12/02/1967    Years since quitting: 52.6  . Smokeless tobacco: Former Systems developer    Types: Chew    Quit date: 2000  Media planner  . Vaping Use: Never used  Substance and Sexual Activity  . Alcohol use: No  . Drug use: No  . Sexual activity: Never  Other Topics Concern  . Not on file  Social History Narrative   Patient is right handed.   Patient drinks 1 cup caffeine daily.      Pt has been married for 60 years and has 2 children, 4 grandchildren, and 3 great-grandchildren. Lives with everyone except great-grandchildren. He does not clean house, but can shop, do yard work, and drive. 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         Epworth Sleepiness Scale Score:  8      --I have HTN   --I seem to be losing my sex drive   --I wake up to urinate frequently at night   --I awake feeling not rested   --I have borderline diabetes   Social  Determinants of Health   Financial Resource Strain:   . Difficulty of Paying Living Expenses: Not on file  Food Insecurity:   . Worried About Charity fundraiser in the Last Year: Not on file  . Ran Out of Food in the Last Year: Not on file  Transportation Needs:   . Lack of Transportation (Medical): Not on file  . Lack of Transportation (Non-Medical): Not on file  Physical Activity:   . Days of Exercise per Week: Not on file  . Minutes of Exercise per Session: Not on file  Stress:   . Feeling of Stress : Not on file  Social  Connections:   . Frequency of Communication with Friends and Family: Not on file  . Frequency of Social Gatherings with Friends and Family: Not on file  . Attends Religious Services: Not on file  . Active Member of Clubs or Organizations: Not on file  . Attends Archivist Meetings: Not on file  . Marital Status: Not on file  Intimate Partner Violence:   . Fear of Current or Ex-Partner: Not on file  . Emotionally Abused: Not on file  . Physically Abused: Not on file  . Sexually Abused: Not on file    FAMILY HISTORY: Family History  Problem Relation Age of Onset  . Cancer Mother        Stomach  . Cancer - Lung Father   . Heart attack Brother   . Diabetes Brother     ALLERGIES:  is allergic to levaquin [levofloxacin in d5w] and penicillins.  MEDICATIONS:  Current Outpatient Medications  Medication Sig Dispense Refill  . alfuzosin (UROXATRAL) 10 MG 24 hr tablet Take 10 mg by mouth at bedtime.     [name] amLODipine (NORVASC) 10 MG tablet Take 10 mg by mouth daily.    [name] amLODipine (NORVASC) 5 MG tablet     . Ascorbic Acid (SUPER C COMPLEX PO) Take 1 capsule by mouth every evening. Super C 1000    . Calcium Carb-Cholecalciferol (CALCIUM 600+D3 PO) Take 1 tablet by mouth every evening.    . Cyanocobalamin (VITAMIN B-12) 5000 MCG SUBL Place 1 tablet under the tongue every evening.     . Ferrous Sulfate (IRON) 28 MG TABS Take 28 mg by mouth every  evening.    . finasteride (PROSCAR) 5 MG tablet Take 5 mg by mouth at bedtime.     . hydrochlorothiazide (HYDRODIURIL) 12.5 MG tablet Take 12.5 mg by mouth daily.    . hydrocortisone (ANUSOL-HC) 25 MG suppository Place 1 suppository (25 mg total) rectally 2 (two) times daily. 12 suppository 0  . Omega-3 Fatty Acids (FISH OIL) 1200 MG CAPS Take 1,200 mg by mouth every evening.    . ondansetron (ZOFRAN) 8 MG tablet Take 1 tablet (8 mg total) by mouth every 8 (eight) hours as needed for nausea. 20 tablet 0  . Polyethyl Glycol-Propyl Glycol (SYSTANE) 0.4-0.3 % SOLN Place 1 drop into both eyes 2 (two) times daily as needed (for dry/irritated eyes.).    [name] simvastatin (ZOCOR) 20 MG tablet Take 20 mg by mouth every evening.    . traMADol (ULTRAM) 50 MG tablet Take 1 tablet (50 mg total) by mouth every 6 (six) hours as needed (pain). (Patient taking differently: Take 25 mg by mouth 2 (two) times daily. ) 28 tablet 0  . triamcinolone ointment (KENALOG) 0.5 % APPLY TO ITCHY RASH ON EXTREMITIES AND WAIST TWICE A DAY FOR 15 DAYS. DO NOT APPLY TO FACE 30 g 0  . Vitamin Mixture (SELENIUM-VITAMIN E) 50-400 MCG-UNIT CAPS Take 1 capsule by mouth every evening.    Alveda Reasons 20 MG TABS tablet TAKE 1 TABLET (20 MG TOTAL) BY MOUTH DAILY WITH SUPPER. 90 tablet 1   No current facility-administered medications for this visit.    REVIEW OF SYSTEMS:   A 10+ POINT REVIEW OF SYSTEMS WAS OBTAINED including neurology, dermatology, psychiatry, cardiac, respiratory, lymph, extremities, GI, GU, Musculoskeletal, constitutional, breasts, reproductive, HEENT.  All pertinent positives are noted in the HPI.  All others are negative.   PHYSICAL EXAMINATION: ECOG PERFORMANCE STATUS: 1 - Symptomatic but completely ambulatory   GENERAL:alert,  in no acute distress and comfortable, HOH SKIN: no acute rashes, no significant lesions EYES: conjunctiva are pink and non-injected, sclera anicteric OROPHARYNX: MMM, no exudates, no  oropharyngeal erythema or ulceration NECK: supple, no JVD LYMPH:  no palpable lymphadenopathy in the cervical, axillary or inguinal regions LUNGS: clear to auscultation b/l with normal respiratory effort HEART: regular rate & rhythm ABDOMEN:  normoactive bowel sounds , non tender, not distended. No palpable hepatosplenomegaly.  Extremity: no pedal edema PSYCH: alert & oriented x 3 with fluent speech NEURO: no focal motor/sensory deficits  LABORATORY DATA:  I have reviewed the data as listed  . CBC Latest Ref Rng & Units 07/25/2020 05/02/2020 04/25/2020  WBC 4.0 - 10.5 K/uL 3.4(L) 2.8(L) 2.1(L)  Hemoglobin 13.0 - 17.0 g/dL 13.9 12.1(L) 12.2(L)  Hematocrit 39 - 52 % 40.4 35.2(L) 35.0(L)  Platelets 150 - 400 K/uL 131(L) 169 112(L)   ANC 1.6K  . CMP Latest Ref Rng & Units 07/25/2020 05/02/2020 04/25/2020  Glucose 70 - 99 mg/dL 188(H) 169(H) 221(H)  BUN 8 - 23 mg/dL 17 15 16   Creatinine 0.61 - 1.24 mg/dL 1.10 1.10 1.19  Sodium 135 - 145 mmol/L 137 139 137  Potassium 3.5 - 5.1 mmol/L 3.9 4.0 4.0  Chloride 98 - 111 mmol/L 103 104 105  CO2 22 - 32 mmol/L 30 28 24   Calcium 8.9 - 10.3 mg/dL 9.1 8.8(L) 8.4(L)  Total Protein 6.5 - 8.1 g/dL 6.5 6.3(L) 6.2(L)  Total Bilirubin 0.3 - 1.2 mg/dL 0.8 0.6 0.7  Alkaline Phos 38 - 126 U/L 79 108 101  AST 15 - 41 U/L 23 34 26  ALT 0 - 44 U/L 27 66(H) 49(H)   . Lab Results  Component Value Date   LDH 192 07/25/2020    04/12/18 Flow Cytometry:   04/12/18 Lung biopsy:     RADIOGRAPHIC STUDIES: I have personally reviewed the radiological images as listed and agreed with the findings in the report. No results found.  ASSESSMENT & PLAN:  79 y.o. male with  1. 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  If this is an isolated lesion this would make in Stage IE.  04/12/18 bx/resection of the right middle lobe revealed an extranodal marginal zone lymphoma   07/21/18 PET/CT revealed Interval resection of the RIGHT middle lobe  pulmonary nodule. 2. Increase thickening of subpleural tissue in the RIGHT upper lobe with increased metabolic activity. In patient with lymphoma history, cannot exclude atypical thoracic lymphoma. 3. No change in small hypermetabolic mediastinal lymph nodes which are favored reactive. 4. Prostatomegaly with nodularity. 5. Stable RIGHT adrenal mass. Favor benign.  03/11/19 CT Chest revealed "Ill-defined ground-glass and consolidative nodules in both lungs remain stable, except for mild increase in size of 1.6 cm central right perihilar nodule. Consider continued followup by chest CT in 6 months. 2. Stable mild partially-calcified mediastinal and bilateral hilar lymphadenopathy. 3. Stable 2.6 cm right adrenal mass,, likely a benign adenoma. Recommend continued attention on follow-up CT. 4. Aortic and coronary artery atherosclerosis."   09/18/2019 CT Angio Chest/Abd/Pel ([id number]) revealed "1. No acute vascular findings. 2. Trace new right pleural effusion, cause uncertain. 3. Similar sub solid nodules and sub solid right subpleural airspace opacity. In this patient with history of lymphoma, the possibility of atypical pulmonary involvement is not readily excluded. Surveillance imaging of the lung lesions is recommended. 4. Nonspecific 1.8 cm focus of arterial phase enhancement in segment 2 of the liver. There is no prior accentuated metabolic activity in this vicinity  on prior PET-CT of 07/21/2018. Possibilities for further workup of this lesion include hepatic protocol MRI with and without contrast, or surveillance on follow up imaging related to the patient's lymphoma. 5. Chronically stable right adrenal mass, likely benign. 6. Other imaging findings of potential clinical significance: Aortic Atherosclerosis (ICD10-I70.0). Coronary atherosclerosis. Mild cardiomegaly. Calcified mediastinal adenopathy, stable. Postoperative findings in the right upper lobe. Stable small well-defined foci of sclerosis in the  right fourth rib. Chronic superior endplate compression at L1. Right foraminal stenosis at L4-5 due to spurring. Lipoma deep to the left gluteus maximus muscle, stable, this mildly abuts the left sciatic nerve. Prostatomegaly."  03/12/2020 PET/CT ([id number]) revealed "1. Interval increase in size and FDG uptake associated with subpleural soft tissue within the anterior right upper lobe compatible with Deauville criteria 4/5 disease. 2. Paravertebral soft tissue within the posterior mediastinum medial to the descending aorta exhibits increased FDG uptake. Deauville criteria 4. 3. Mild increase in size and degree of FDG uptake associated with bilateral pulmonary nodules. 4. Similar mild to moderate increased FDG uptake associated with calcified mediastinal and hilar lymph nodes. This is a nonspecific finding and may reflect changes of granulomatous inflammation or infection. 5. Similar appearance of indeterminate right adrenal nodule which exhibits mild to moderate FDG uptake with SUV max of 5.4 6. No significant FDG uptake within the liver."  03/12/2020 MRI Liver ([id number]) revealed benign hemangioma, no hypermetabolic lesion  2) Acute Bronchitis  PLAN:  -Discussed pt labwork today, 07/25/20; all values are WNL except for WBC at 3.4K, RBC at 3.97, MCV at 101.8, MCH at 35.0, PLT at 131K, Lymphs Abs at 0.4K, Glucose at 188, Anion gap at 4. -Discussed 07/25/2020 LDH at 192 - WNL -No lab or clinical evidence of MALT lymphoma recurrence/progression at this time. Will continue watchful observation.  -Recommend pt f/u with PCP as scheduled -Will get PET/CT in 1 week  -Will see back in 6 months with labs  2.  Patient Active Problem List   Diagnosis Date Noted  . Marginal zone lymphoma ([place]) 03/25/2020  . Counseling regarding advance care planning and goals of care 03/25/2020  . Renal insufficiency 04/19/2018  . Chronic anticoagulation 04/19/2018  . Lung mass 04/12/2018  . Permanent atrial  fibrillation ([place]) 01/19/2018  . Pneumothorax 01/25/2016  . Pneumothorax after biopsy 01/25/2016  . Coronary artery calcification seen on CT scan 01/21/2016  . Murmur, cardiac 01/21/2016  . Hypercholesterolemia 01/21/2016  . Essential hypertension 01/09/2016  . BPH (benign prostatic hyperplasia) 01/09/2016  . Solitary pulmonary nodule 01/09/2016  . Thrombocytopenia ([place]) 01/09/2016  . Cervical spondylosis without myelopathy 12/01/2013  . Lumbago 12/01/2013  . Diabetes mellitus     FOLLOW UP: Plz schedule PET/CT as ordered in 04/2020 for 07/11/2020 -- not scheduled or done yet RTC with Dr Irene Limbo with labs in 6 months   The total time spent in the appt was 20 minutes and more than 50% was on counseling and direct patient cares.  All of the patient's questions were answered with apparent satisfaction. The patient knows to call the clinic with any problems, questions or concerns.    Sullivan Lone MD [employer] AAHIVMS [facility] Hematology/Oncology Physician [facility]  (Office):       [phone] (Work cell):  [phone] (Fax):           [phone]  07/25/2020 11:04 AM  I, Yevette Edwards, am acting as a scribe for Dr. Sullivan Lone.   .I have reviewed the above documentation for accuracy and completeness,  and I agree with the above. Brunetta Genera MD

## 2020-07-25 ENCOUNTER — Inpatient Hospital Stay (HOSPITAL_BASED_OUTPATIENT_CLINIC_OR_DEPARTMENT_OTHER): Payer: Medicare HMO | Admitting: Hematology

## 2020-07-25 ENCOUNTER — Inpatient Hospital Stay: Payer: Medicare HMO | Attending: Hematology

## 2020-07-25 ENCOUNTER — Other Ambulatory Visit: Payer: Self-pay

## 2020-07-25 VITALS — BP 129/82 | HR 72 | Temp 97.5°F | Resp 18 | Ht 70.0 in | Wt 188.9 lb

## 2020-07-25 DIAGNOSIS — Z87891 Personal history of nicotine dependence: Secondary | ICD-10-CM | POA: Insufficient documentation

## 2020-07-25 DIAGNOSIS — Z8249 Family history of ischemic heart disease and other diseases of the circulatory system: Secondary | ICD-10-CM | POA: Diagnosis not present

## 2020-07-25 DIAGNOSIS — Z7901 Long term (current) use of anticoagulants: Secondary | ICD-10-CM | POA: Insufficient documentation

## 2020-07-25 DIAGNOSIS — Z801 Family history of malignant neoplasm of trachea, bronchus and lung: Secondary | ICD-10-CM | POA: Insufficient documentation

## 2020-07-25 DIAGNOSIS — R21 Rash and other nonspecific skin eruption: Secondary | ICD-10-CM | POA: Diagnosis not present

## 2020-07-25 DIAGNOSIS — C8580 Other specified types of non-Hodgkin lymphoma, unspecified site: Secondary | ICD-10-CM

## 2020-07-25 DIAGNOSIS — I4891 Unspecified atrial fibrillation: Secondary | ICD-10-CM | POA: Insufficient documentation

## 2020-07-25 DIAGNOSIS — C884 Extranodal marginal zone B-cell lymphoma of mucosa-associated lymphoid tissue [MALT-lymphoma]: Secondary | ICD-10-CM | POA: Diagnosis not present

## 2020-07-25 DIAGNOSIS — Z298 Encounter for other specified prophylactic measures: Secondary | ICD-10-CM

## 2020-07-25 DIAGNOSIS — H9190 Unspecified hearing loss, unspecified ear: Secondary | ICD-10-CM | POA: Diagnosis not present

## 2020-07-25 DIAGNOSIS — Z833 Family history of diabetes mellitus: Secondary | ICD-10-CM | POA: Insufficient documentation

## 2020-07-25 DIAGNOSIS — R0789 Other chest pain: Secondary | ICD-10-CM | POA: Diagnosis not present

## 2020-07-25 DIAGNOSIS — Z79899 Other long term (current) drug therapy: Secondary | ICD-10-CM | POA: Insufficient documentation

## 2020-07-25 DIAGNOSIS — Z800 Family history of malignant neoplasm of digestive organs: Secondary | ICD-10-CM | POA: Insufficient documentation

## 2020-07-25 LAB — CMP (CANCER CENTER ONLY)
ALT: 27 U/L (ref 0–44)
AST: 23 U/L (ref 15–41)
Albumin: 3.8 g/dL (ref 3.5–5.0)
Alkaline Phosphatase: 79 U/L (ref 38–126)
Anion gap: 4 — ABNORMAL LOW (ref 5–15)
BUN: 17 mg/dL (ref 8–23)
CO2: 30 mmol/L (ref 22–32)
Calcium: 9.1 mg/dL (ref 8.9–10.3)
Chloride: 103 mmol/L (ref 98–111)
Creatinine: 1.1 mg/dL (ref 0.61–1.24)
GFR, Est AFR Am: >60 mL/min (ref >60)
GFR, Estimated: >60 mL/min (ref >60)
Glucose, Bld: 188 mg/dL — ABNORMAL HIGH (ref 70–99)
Potassium: 3.9 mmol/L (ref 3.5–5.1)
Sodium: 137 mmol/L (ref 135–145)
Total Bilirubin: 0.8 mg/dL (ref 0.3–1.2)
Total Protein: 6.5 g/dL (ref 6.5–8.1)

## 2020-07-25 LAB — CBC WITH DIFFERENTIAL/PLATELET
Abs Immature Granulocytes: 0.01 10*3/uL (ref 0.00–0.07)
Basophils Absolute: 0 10*3/uL (ref 0.0–0.1)
Basophils Relative: 0 %
Eosinophils Absolute: 0.1 10*3/uL (ref 0.0–0.5)
Eosinophils Relative: 2 %
HCT: 40.4 % (ref 39.0–52.0)
Hemoglobin: 13.9 g/dL (ref 13.0–17.0)
Immature Granulocytes: 0 %
Lymphocytes Relative: 12 %
Lymphs Abs: 0.4 10*3/uL — ABNORMAL LOW (ref 0.7–4.0)
MCH: 35 pg — ABNORMAL HIGH (ref 26.0–34.0)
MCHC: 34.4 g/dL (ref 30.0–36.0)
MCV: 101.8 fL — ABNORMAL HIGH (ref 80.0–100.0)
Monocytes Absolute: 0.2 10*3/uL (ref 0.1–1.0)
Monocytes Relative: 7 %
Neutro Abs: 2.7 10*3/uL (ref 1.7–7.7)
Neutrophils Relative %: 79 %
Platelets: 131 10*3/uL — ABNORMAL LOW (ref 150–400)
RBC: 3.97 MIL/uL — ABNORMAL LOW (ref 4.22–5.81)
RDW: 11.8 % (ref 11.5–15.5)
WBC: 3.4 10*3/uL — ABNORMAL LOW (ref 4.0–10.5)
nRBC: 0 % (ref 0.0–0.2)

## 2020-07-25 LAB — LACTATE DEHYDROGENASE: LDH: 192 U/L (ref 98–192)

## 2020-07-27 ENCOUNTER — Other Ambulatory Visit: Payer: Self-pay | Admitting: Hematology

## 2020-07-30 ENCOUNTER — Telehealth: Payer: Self-pay | Admitting: *Deleted

## 2020-07-30 DIAGNOSIS — I4891 Unspecified atrial fibrillation: Secondary | ICD-10-CM | POA: Diagnosis not present

## 2020-07-30 DIAGNOSIS — D6869 Other thrombophilia: Secondary | ICD-10-CM | POA: Diagnosis not present

## 2020-07-30 DIAGNOSIS — E1169 Type 2 diabetes mellitus with other specified complication: Secondary | ICD-10-CM | POA: Diagnosis not present

## 2020-07-30 DIAGNOSIS — R42 Dizziness and giddiness: Secondary | ICD-10-CM | POA: Diagnosis not present

## 2020-07-30 NOTE — Telephone Encounter (Signed)
Patient and spouse came to [facility]. Asked to see Dr. Grier Mitts desk nurse in lobby. Ms. Kassebaum stated a family member had seen patient's last glucose level (188 - non-fasting) on lab work 07/25/20 and told them they should come to the office. Dr. Irene Limbo informed and asked that he contact his PCP.  Informed them that they should contact the patient's primary care doctor for treatment of elevated blood sugars. The verbalized understanding and stated they would call them. Dr. Irene Limbo informed

## 2020-08-15 ENCOUNTER — Telehealth: Payer: Self-pay | Admitting: *Deleted

## 2020-08-15 NOTE — Telephone Encounter (Signed)
Patient wife called - when to get PET scan that Dr. Irene Limbo told him to have done. Advised her PET was ordered in June 202. Per Auth staff, PET auth will need to be renewed. Once renewed, PET can be scheduled. James Sweeney verbalized understanding.

## 2020-08-24 ENCOUNTER — Ambulatory Visit (HOSPITAL_COMMUNITY): Admission: RE | Admit: 2020-08-24 | Payer: Medicare HMO | Source: Ambulatory Visit

## 2020-09-01 ENCOUNTER — Other Ambulatory Visit: Payer: Self-pay | Admitting: Hematology

## 2020-09-07 ENCOUNTER — Ambulatory Visit: Payer: Medicare HMO | Admitting: Podiatry

## 2020-09-14 ENCOUNTER — Telehealth: Payer: Self-pay | Admitting: Cardiovascular Disease

## 2020-09-14 ENCOUNTER — Ambulatory Visit (INDEPENDENT_AMBULATORY_CARE_PROVIDER_SITE_OTHER): Payer: Medicare HMO | Admitting: Podiatry

## 2020-09-14 ENCOUNTER — Telehealth: Payer: Self-pay

## 2020-09-14 ENCOUNTER — Other Ambulatory Visit: Payer: Self-pay

## 2020-09-14 DIAGNOSIS — M79675 Pain in left toe(s): Secondary | ICD-10-CM

## 2020-09-14 DIAGNOSIS — B351 Tinea unguium: Secondary | ICD-10-CM | POA: Diagnosis not present

## 2020-09-14 DIAGNOSIS — Q828 Other specified congenital malformations of skin: Secondary | ICD-10-CM | POA: Diagnosis not present

## 2020-09-14 DIAGNOSIS — M79674 Pain in right toe(s): Secondary | ICD-10-CM

## 2020-09-14 NOTE — Telephone Encounter (Signed)
Called pt's wife she states that medication is $131/30 days. She states that pt has 1 pill left for tomorrow

## 2020-09-14 NOTE — Telephone Encounter (Signed)
Pt walked in and states that his Xarelto is too expensive("this is highway robbery") and would like to switch to another medication. Please advise.

## 2020-09-14 NOTE — Patient Instructions (Signed)
The pad I put on your foot is called an aperture pad, you can get these online such as on [place], see if your grandson can order them

## 2020-09-14 NOTE — Telephone Encounter (Signed)
Samples and pt assistance form at the front desk for pt/wife to p/u. Wife notified she will pick up before 5pm today

## 2020-09-14 NOTE — Telephone Encounter (Signed)
Follow Up:     Pt 's wife wanted Sharyn Lull to know that she received her message about the samples. She wanted her to know that she will be today to pick them up.

## 2020-09-17 ENCOUNTER — Encounter: Payer: Self-pay | Admitting: Podiatry

## 2020-09-17 NOTE — Progress Notes (Signed)
  Subjective:  Patient ID: James Sweeney, male    DOB: [birth year],  MRN: [id number]  Chief Complaint  Patient presents with  . Foot Problem    Right plantar foot lesions painful  . Nail Problem    Nail trim 1-5 bilateral    79 y.o. male presents with the above complaint. History confirmed with patient.   Objective:  Physical Exam: warm, good capillary refill, no trophic changes or ulcerative lesions, normal DP and PT pulses, normal sensory exam and onychomycosis x10.  Multiple porokeratosis plantar forefoot.  Assessment:   1. Onychomycosis   2. Pain due to onychomycosis of toenails of both feet   3. Porokeratosis      Plan:  Patient was evaluated and treated and all questions answered.  Discussed the etiology and treatment options for the condition in detail with the patient. Educated patient on the topical and oral treatment options for mycotic nails. Recommended debridement of the nails today. Sharp and mechanical debridement performed of all painful and mycotic nails today. Nails debrided in length and thickness using a nail nipper and a mechanical burr to level of comfort. Discussed treatment options including appropriate shoe gear. Follow up as needed for painful nails.  All symptomatic hyperkeratoses were safely debrided with a sterile #15 blade to patient's level of comfort without incident. We discussed preventative and palliative care of these lesions including supportive and accommodative shoegear, padding, prefabricated and custom molded accommodative orthoses, use of a pumice stone and lotions/creams daily.   Return if symptoms worsen or fail to improve.

## 2020-09-28 ENCOUNTER — Other Ambulatory Visit: Payer: Self-pay | Admitting: Hematology

## 2020-10-01 DIAGNOSIS — Z0000 Encounter for general adult medical examination without abnormal findings: Secondary | ICD-10-CM | POA: Diagnosis not present

## 2020-10-01 DIAGNOSIS — B351 Tinea unguium: Secondary | ICD-10-CM | POA: Diagnosis not present

## 2020-10-01 DIAGNOSIS — D696 Thrombocytopenia, unspecified: Secondary | ICD-10-CM | POA: Diagnosis not present

## 2020-10-01 DIAGNOSIS — E782 Mixed hyperlipidemia: Secondary | ICD-10-CM | POA: Diagnosis not present

## 2020-10-01 DIAGNOSIS — M5412 Radiculopathy, cervical region: Secondary | ICD-10-CM | POA: Diagnosis not present

## 2020-10-01 DIAGNOSIS — C8582 Other specified types of non-Hodgkin lymphoma, intrathoracic lymph nodes: Secondary | ICD-10-CM | POA: Diagnosis not present

## 2020-10-01 DIAGNOSIS — I10 Essential (primary) hypertension: Secondary | ICD-10-CM | POA: Diagnosis not present

## 2020-10-01 DIAGNOSIS — I4891 Unspecified atrial fibrillation: Secondary | ICD-10-CM | POA: Diagnosis not present

## 2020-10-01 DIAGNOSIS — E1169 Type 2 diabetes mellitus with other specified complication: Secondary | ICD-10-CM | POA: Diagnosis not present

## 2020-10-01 DIAGNOSIS — I700 Atherosclerosis of aorta: Secondary | ICD-10-CM | POA: Diagnosis not present

## 2020-10-01 DIAGNOSIS — D649 Anemia, unspecified: Secondary | ICD-10-CM | POA: Diagnosis not present

## 2020-10-03 DIAGNOSIS — Z1211 Encounter for screening for malignant neoplasm of colon: Secondary | ICD-10-CM | POA: Diagnosis not present

## 2020-10-07 ENCOUNTER — Other Ambulatory Visit: Payer: Self-pay | Admitting: Hematology

## 2020-10-08 ENCOUNTER — Ambulatory Visit (INDEPENDENT_AMBULATORY_CARE_PROVIDER_SITE_OTHER): Payer: Medicare HMO | Admitting: Podiatry

## 2020-10-08 ENCOUNTER — Encounter: Payer: Self-pay | Admitting: Podiatry

## 2020-10-08 ENCOUNTER — Other Ambulatory Visit: Payer: Self-pay

## 2020-10-08 DIAGNOSIS — E1169 Type 2 diabetes mellitus with other specified complication: Secondary | ICD-10-CM

## 2020-10-08 DIAGNOSIS — E1142 Type 2 diabetes mellitus with diabetic polyneuropathy: Secondary | ICD-10-CM | POA: Diagnosis not present

## 2020-10-08 DIAGNOSIS — M79674 Pain in right toe(s): Secondary | ICD-10-CM

## 2020-10-08 DIAGNOSIS — M79675 Pain in left toe(s): Secondary | ICD-10-CM

## 2020-10-08 DIAGNOSIS — B351 Tinea unguium: Secondary | ICD-10-CM

## 2020-10-08 NOTE — Patient Instructions (Signed)
Diabetes Mellitus and Foot Care Foot care is an important part of your health, especially when you have diabetes. Diabetes may cause you to have problems because of poor blood flow (circulation) to your feet and legs, which can cause your skin to:  Become thinner and drier.  Break more easily.  Heal more slowly.  Peel and crack. You may also have nerve damage (neuropathy) in your legs and feet, causing decreased feeling in them. This means that you may not notice minor injuries to your feet that could lead to more serious problems. Noticing and addressing any potential problems early is the best way to prevent future foot problems. How to care for your feet Foot hygiene  Wash your feet daily with warm water and mild soap. Do not use hot water. Then, pat your feet and the areas between your toes until they are completely dry. Do not soak your feet as this can dry your skin.  Trim your toenails straight across. Do not dig under them or around the cuticle. File the edges of your nails with an emery board or nail file.  Apply a moisturizing lotion or petroleum jelly to the skin on your feet and to dry, brittle toenails. Use lotion that does not contain alcohol and is unscented. Do not apply lotion between your toes. Shoes and socks  Wear clean socks or stockings every day. Make sure they are not too tight. Do not wear knee-high stockings since they may decrease blood flow to your legs.  Wear shoes that fit properly and have enough cushioning. Always look in your shoes before you put them on to be sure there are no objects inside.  To break in new shoes, wear them for just a few hours a day. This prevents injuries on your feet. Wounds, scrapes, corns, and calluses  Check your feet daily for blisters, cuts, bruises, sores, and redness. If you cannot see the bottom of your feet, use a mirror or ask someone for help.  Do not cut corns or calluses or try to remove them with medicine.  If you  find a minor scrape, cut, or break in the skin on your feet, keep it and the skin around it clean and dry. You may clean these areas with mild soap and water. Do not clean the area with peroxide, alcohol, or iodine.  If you have a wound, scrape, corn, or callus on your foot, look at it several times a day to make sure it is healing and not infected. Check for: ? Redness, swelling, or pain. ? Fluid or blood. ? Warmth. ? Pus or a bad smell. General instructions  Do not cross your legs. This may decrease blood flow to your feet.  Do not use heating pads or hot water bottles on your feet. They may burn your skin. If you have lost feeling in your feet or legs, you may not know this is happening until it is too late.  Protect your feet from hot and cold by wearing shoes, such as at the beach or on hot pavement.  Schedule a complete foot exam at least once a year (annually) or more often if you have foot problems. If you have foot problems, report any cuts, sores, or bruises to your health care provider immediately. Contact a health care provider if:  You have a medical condition that increases your risk of infection and you have any cuts, sores, or bruises on your feet.  You have an injury that is not   healing.  You have redness on your legs or feet.  You feel burning or tingling in your legs or feet.  You have pain or cramps in your legs and feet.  Your legs or feet are numb.  Your feet always feel cold.  You have pain around a toenail. Get help right away if:  You have a wound, scrape, corn, or callus on your foot and: ? You have pain, swelling, or redness that gets worse. ? You have fluid or blood coming from the wound, scrape, corn, or callus. ? Your wound, scrape, corn, or callus feels warm to the touch. ? You have pus or a bad smell coming from the wound, scrape, corn, or callus. ? You have a fever. ? You have a red line going up your leg. Summary  Check your feet every day  for cuts, sores, red spots, swelling, and blisters.  Moisturize feet and legs daily.  Wear shoes that fit properly and have enough cushioning.  If you have foot problems, report any cuts, sores, or bruises to your health care provider immediately.  Schedule a complete foot exam at least once a year (annually) or more often if you have foot problems. This information is not intended to replace advice given to you by your health care provider. Make sure you discuss any questions you have with your health care provider. Document Revised: 07/13/2019 Document Reviewed: 11/21/2016 Elsevier Patient Education  [place].  Diabetic Neuropathy Diabetic neuropathy refers to nerve damage that is caused by diabetes (diabetes mellitus). Over time, people with diabetes can develop nerve damage throughout the body. There are several types of diabetic neuropathy:  Peripheral neuropathy. This is the most common type of diabetic neuropathy. It causes damage to nerves that carry signals between the spinal cord and other parts of the body (peripheral nerves). This usually affects nerves in the feet and legs first, and may eventually affect the hands and arms. The damage affects the ability to sense touch or temperature.  Autonomic neuropathy. This type causes damage to nerves that control involuntary functions (autonomic nerves). These nerves carry signals that control: ? Heartbeat. ? Body temperature. ? Blood pressure. ? Urination. ? Digestion. ? Sweating. ? Sexual function. ? Response to changing blood sugar (glucose) levels.  Focal neuropathy. This type of nerve damage affects one area of the body, such as an arm, a leg, or the face. The injury may involve one nerve or a small group of nerves. Focal neuropathy can be painful and unpredictable, and occurs most often in older adults with diabetes. This often develops suddenly, but usually improves over time and does not cause long-term problems.   Proximal neuropathy. This type of nerve damage affects the nerves of the thighs, hips, buttocks, or legs. It causes severe pain, weakness, and muscle death (atrophy), usually in the thigh muscles. It is more common among older men and people who have type 2 diabetes. The length of recovery time may vary. What are the causes? Peripheral, autonomic, and focal neuropathies are caused by diabetes that is not well controlled with treatment. The cause of proximal neuropathy is not known, but it may be caused by inflammation related to uncontrolled blood glucose levels. What are the signs or symptoms? Peripheral neuropathy Peripheral neuropathy develops slowly over time. When the nerves of the feet and legs no longer work, you may experience:  Burning, stabbing, or aching pain in the legs or feet.  Pain or cramping in the legs or feet.  Loss of feeling (numbness) and inability to feel pressure or pain in the feet. This can lead to: ? Thick calluses or sores on areas of constant pressure. ? Ulcers. ? Reduced ability to feel temperature changes.  Foot deformities.  Muscle weakness.  Loss of balance or coordination. Autonomic neuropathy The symptoms of autonomic neuropathy vary depending on which nerves are affected. Symptoms may include:  Problems with digestion, such as: ? Nausea or vomiting. ? Poor appetite. ? Bloating. ? Diarrhea or constipation. ? Trouble swallowing. ? Losing weight without trying to.  Problems with the heart, blood and lungs, such as: ? Dizziness, especially when standing up. ? Fainting. ? Shortness of breath. ? Irregular heartbeat.  Bladder problems, such as: ? Trouble starting or stopping urination. ? Leaking urine. ? Trouble emptying the bladder. ? Urinary tract infections (UTIs).  Problems with other body functions, such as: ? Sweat. You may sweat too much or too little. ? Temperature. You might get hot easily. Or, you might feel cold more than usual. ?  Sexual function. Men may not be able to get or maintain an erection. Women may have vaginal dryness and difficulty with arousal. Focal neuropathy Symptoms affect only one area of the body. Common symptoms include:  Numbness.  Tingling.  Burning pain.  Prickling feeling.  Very sensitive skin.  Weakness.  Inability to move (paralysis).  Muscle twitching.  Muscles getting smaller (wasting).  Poor coordination.  Double or blurred vision. Proximal neuropathy  Sudden, severe pain in the hip, thigh, or buttocks. Pain may spread from the back into the legs (sciatica).  Pain and numbness in the arms and legs.  Tingling.  Loss of bladder control or bowel control.  Weakness and wasting of thigh muscles.  Difficulty getting up from a seated position.  Abdominal swelling.  Unexplained weight loss. How is this diagnosed? Diagnosis usually involves reviewing your medical history and any symptoms you have. Diagnosis varies depending on the type of neuropathy your health care provider suspects. Peripheral neuropathy Your health care provider will check areas that are affected by your nervous system (neurologic exam), such as your reflexes, how you move, and what you can feel. You may have other tests, such as:  Blood tests.  Removal and examination of fluid that surrounds the spinal cord (lumbar puncture).  CT scan.  MRI.  A test to check the nerves that control muscles (electromyogram, EMG).  Tests of how quickly messages pass through your nerves (nerve conduction velocity tests).  Removal of a small piece of nerve to be examined under a microscope (biopsy). Autonomic neuropathy You may have tests, such as:  Tests to measure your blood pressure and heart rate. This may include monitoring you while you are safely secured to an exam table that moves you from a lying position to an upright position (table tilt test).  Breathing tests to check your lungs.  Tests to  check how food moves through the digestive system (gastric emptying tests).  Blood, sweat, or urine tests.  Ultrasound of your bladder.  Spinal fluid tests. Focal neuropathy This condition may be diagnosed with:  A neurologic exam.  CT scan.  MRI.  EMG.  Nerve conduction velocity tests. Proximal neuropathy There is no test to diagnose this type of neuropathy. You may have tests to rule out other possible causes of this type of neuropathy. Tests may include:  X-rays of your spine and lumbar region.  Lumbar puncture.  MRI. How is this treated? The goal of treatment is  to keep nerve damage from getting worse. The most important part of treatment is keeping your blood glucose level and your A1C level within your target range by following your diabetes management plan. Over time, maintaining lower blood glucose levels helps lessen symptoms. In some cases, you may need prescription pain medicine. Follow these instructions at home:  Lifestyle   Do not use any products that contain nicotine or tobacco, such as cigarettes and e-cigarettes. If you need help quitting, ask your health care provider.  Be physically active every day. Include strength training and balance exercises.  Follow a healthy meal plan.  Work with your health care provider to manage your blood pressure. General instructions  Follow your diabetes management plan as directed. ? Check your blood glucose levels as directed by your health care provider. ? Keep your blood glucose in your target range as directed by your health care provider. ? Have your A1C level checked at least two times a year, or as often as told by your health care provider.  Take over the counter and prescription medicines only as told by your health care provider. This includes insulin and diabetes medicine.  Do not drive or use heavy machinery while taking prescription pain medicines.  Check your skin and feet every day for cuts,  bruises, redness, blisters, or sores.  Keep all follow up visits as told by your health care provider. This is important. Contact a health care provider if:  You have burning, stabbing, or aching pain in your legs or feet.  You are unable to feel pressure or pain in your feet.  You develop problems with digestion, such as: ? Nausea. ? Vomiting. ? Bloating. ? Constipation. ? Diarrhea. ? Abdominal pain.  You have difficulty with urination, such as inability: ? To control when you urinate (incontinence). ? To completely empty the bladder (retention).  You have palpitations.  You feel dizzy, weak, or faint when you stand up. Get help right away if:  You cannot urinate.  You have sudden weakness or loss of coordination.  You have trouble speaking.  You have pain or pressure in your chest.  You have an irregular heart beat.  You have sudden inability to move a part of your body. Summary  Diabetic neuropathy refers to nerve damage that is caused by diabetes. It can affect nerves throughout the entire body, causing numbness and pain in the arms, legs, digestive tract, heart, and other body systems.  Keep your blood glucose level and your blood pressure in your target range, as directed by your health care provider. This can help prevent neuropathy from getting worse.  Check your skin and feet every day for cuts, bruises, redness, blisters, or sores.  Do not use any products that contain nicotine or tobacco, such as cigarettes and e-cigarettes. If you need help quitting, ask your health care provider. This information is not intended to replace advice given to you by your health care provider. Make sure you discuss any questions you have with your health care provider. Document Revised: 12/02/2017 Document Reviewed: 11/24/2016 Elsevier Patient Education  2020 [employer].

## 2020-10-11 NOTE — Progress Notes (Signed)
  Subjective:  Patient ID: James Sweeney, male    DOB: September 08, 1941,  MRN: [id number]  Chief Complaint  Patient presents with  . Callouses    follow up porokeratosis, referral from pcp for neuropathy  . Peripheral Neuropathy    79 y.o. male returns with the above complaint. History confirmed with patient.   Objective:  Physical Exam: warm, good capillary refill, no trophic changes or ulcerative lesions, normal DP and PT pulses, normal sensory exam and onychomycosis x10.  Multiple porokeratosis plantar forefoot.  Assessment:   1. Onychomycosis   2. Pain due to onychomycosis of toenails of both feet   3. Onychomycosis of multiple toenails with type 2 diabetes mellitus and peripheral angiopathy ([place])   4. Diabetic peripheral neuropathy ([place])      Plan:  Patient was evaluated and treated and all questions answered.  Again today I discussed with him that his diabetic peripheral neuropathy is not likely to improve.  He has tried gabapentin and Lyrica previously.  I discussed with him that he should discuss this with his PCP further as I do not primarily treat neuropathy.  I discussed with him the risk of neuropathy and diabetic foot ulcerations and why this is important.  I think he should continue with routine diabetic foot examinations with [name] in nail care in the future.    Return in about 2 months (around 12/09/2020) for diabetic foot exam and nail care.

## 2020-10-12 ENCOUNTER — Ambulatory Visit: Payer: Medicare HMO | Admitting: Cardiovascular Disease

## 2020-10-16 ENCOUNTER — Telehealth: Payer: Self-pay | Admitting: *Deleted

## 2020-10-16 NOTE — Telephone Encounter (Signed)
Xarelto assistance has been faxed to [employer] [facility] [employer]

## 2020-11-07 ENCOUNTER — Telehealth: Payer: Self-pay | Admitting: *Deleted

## 2020-11-07 NOTE — Telephone Encounter (Signed)
Medication Samples have been provided to the patient.  Drug name: Xarelto       Strength: 20 mg        Qty: 3 bottles  LOT: [id number]  Exp.Date: 08/23

## 2020-11-13 DIAGNOSIS — H524 Presbyopia: Secondary | ICD-10-CM | POA: Diagnosis not present

## 2020-11-13 DIAGNOSIS — Z0100 Encounter for examination of eyes and vision without abnormal findings: Secondary | ICD-10-CM | POA: Diagnosis not present

## 2020-11-16 ENCOUNTER — Other Ambulatory Visit: Payer: Self-pay

## 2020-11-16 ENCOUNTER — Ambulatory Visit: Payer: Medicare HMO | Admitting: Cardiovascular Disease

## 2020-11-16 ENCOUNTER — Encounter: Payer: Self-pay | Admitting: Cardiovascular Disease

## 2020-11-16 VITALS — BP 146/76 | HR 63 | Ht 70.0 in | Wt 195.0 lb

## 2020-11-16 DIAGNOSIS — I2510 Atherosclerotic heart disease of native coronary artery without angina pectoris: Secondary | ICD-10-CM | POA: Diagnosis not present

## 2020-11-16 DIAGNOSIS — E7800 Pure hypercholesterolemia, unspecified: Secondary | ICD-10-CM | POA: Diagnosis not present

## 2020-11-16 DIAGNOSIS — C8580 Other specified types of non-Hodgkin lymphoma, unspecified site: Secondary | ICD-10-CM

## 2020-11-16 DIAGNOSIS — I4821 Permanent atrial fibrillation: Secondary | ICD-10-CM

## 2020-11-16 DIAGNOSIS — Z7901 Long term (current) use of anticoagulants: Secondary | ICD-10-CM | POA: Diagnosis not present

## 2020-11-16 DIAGNOSIS — I10 Essential (primary) hypertension: Secondary | ICD-10-CM

## 2020-11-16 MED ORDER — ATORVASTATIN CALCIUM 20 MG PO TABS: 20.0000 mg | ORAL_TABLET | Freq: Every day | ORAL | 3 refills | Status: DC

## 2020-11-16 NOTE — Patient Instructions (Addendum)
Medication Instructions:  STOP the Simvastatin START the Atorvastatin 20 mg once daily  Please call us with the Amlodipine dosage at [phone]  *If you need a refill on your cardiac medications before your next appointment, please call your pharmacy*   Lab Work: None ordered If you have labs (blood work) drawn today and your tests are completely normal, you will receive your results only by: [name] MyChart Message (if you have MyChart) OR . A paper copy in the mail If you have any lab test that is abnormal or we need to change your treatment, we will call you to review the results.   Testing/Procedures: None ordered   Follow-Up: At [facility], you and your health needs are our priority.  As part of our continuing mission to provide you with exceptional heart care, we have created designated Provider Care Teams.  These Care Teams include your primary Cardiologist (physician) and Advanced Practice Providers (APPs -  Physician Assistants and Nurse Practitioners) who all work together to provide you with the care you need, when you need it.  We recommend signing up for the patient portal called "MyChart".  Sign up information is provided on this After Visit Summary.  MyChart is used to connect with patients for Virtual Visits (Telemedicine).  Patients are able to view lab/test results, encounter notes, upcoming appointments, etc.  Non-urgent messages can be sent to your provider as well.   To learn more about what you can do with MyChart, go to [email or web address].    Your next appointment:   12 month(s)  The format for your next appointment:   In Person  Provider:   You may see Sanda Klein, MD or one of the following Advanced Practice Providers on your designated Care Team:    Almyra Deforest, PA-C  Fabian Sharp, PA-C or   Roby Lofts, [place]

## 2020-11-16 NOTE — Progress Notes (Signed)
Patient ID: James Sweeney, male   DOB: [birth year], 80 y.o.   MRN: [id number]     Cardiology Office Note    Date:  11/18/2020   ID:  James Sweeney, DOB [birth year], MRN [id number]  PCP:  James Contras, MD  Cardiologist:   James Klein, MD   Chief Complaint  Patient presents with  . Atrial Fibrillation    History of Present Illness:  James Sweeney is a 80 y.o. male with longstanding persistent atrial fibrillation, numerous coronary risk factors, but a benign 2017 workup for coronary artery calcification seen on chest CT, performed for lung mass, returning for follow-up (repeat biopsy of the lung mass shows that he has a MALT lymphoma, currently being treated with observation).  As before, he complains of leg pain and cold feet, wonders if he has PAD. Pain is always there, actually may improve as he walks during the day. Previous duplex US did not show arterial obstruction. Has prominent symptoms of symmetrical neuropathy.  His major complaints are related to leg pain. His legs hurt at rest, worse if he walks longer distances. He recently performed bilateral lower extremity ultrasonography but did not show evidence of arterial insufficiency.  He has spontaneously rate-controlled permanent atrial fibrillation, on Xarelto. He is unaware of palpitations. He has not had falls or bleeding. Heart rate at home is in the 60s and BP is 125-135/65-70.  The patient specifically denies any chest pain at rest exertion, dyspnea at rest or with exertion, orthopnea, paroxysmal nocturnal dyspnea, syncope, palpitations, focal neurological deficits, intermittent claudication, lower extremity edema, unexplained weight gain, cough, hemoptysis or wheezing.  He has a mildly dilated left atrium and mild LVH, but otherwise no evidence of structural heart disease.  He does not have a history of stroke, TIA or other embolic events.  He is tolerating anticoagulation with Xarelto without any bleeding complications.   He is compliant with statin therapy and his blood pressure is normal on his current medical regimen. We have discussed the option for cardioversion, but he prefers conservative management.   Past Medical History:  Diagnosis Date  . Cervical spondylosis without myelopathy 12/01/2013  . Diabetes mellitus    borderline  . Enlarged prostate   . Frequency of urination   . Hearing deficit   . Heart murmur   . History of kidney stones    removed with Lithotripsy  . HOH (hard of hearing)    right ear  . Hyperlipemia   . Hypertension   . Lumbago 12/01/2013  . Pneumonia 2016  . Polio    right leg smaller than left  . Shortness of breath dyspnea    with exertion    Past Surgical History:  Procedure Laterality Date  . CATARACT EXTRACTION Bilateral 2014  . LUNG BIOPSY Right 2018   March  . VIDEO ASSISTED THORACOSCOPY (VATS)/WEDGE RESECTION Right 04/12/2018   Procedure: VIDEO ASSISTED THORACOSCOPY (VATS), WEDGE RESECTION RIGHT MIDDLE LOBE, PLACEMENT OF ON Q CATHETER;  Surgeon: [name], MD;  Location: [place];  Service: Thoracic;  Laterality: Right;  [name] VIDEO BRONCHOSCOPY N/A 04/12/2018   Procedure: VIDEO BRONCHOSCOPY;  Surgeon: [name], MD;  Location: [place];  Service: Thoracic;  Laterality: N/A;  . VIDEO BRONCHOSCOPY WITH ENDOBRONCHIAL NAVIGATION N/A 01/25/2016   Procedure: VIDEO BRONCHOSCOPY WITH ENDOBRONCHIAL NAVIGATION;  Surgeon: [name], MD;  Location: [place];  Service: Thoracic;  Laterality: N/A;  . VIDEO BRONCHOSCOPY WITH ENDOBRONCHIAL ULTRASOUND N/A 01/25/2016   Procedure: VIDEO BRONCHOSCOPY WITH  ENDOBRONCHIAL ULTRASOUND;  Surgeon: [name], MD;  Location: [facility];  Service: Thoracic;  Laterality: N/A;    Outpatient Medications Prior to Visit  Medication Sig Dispense Refill  . alfuzosin (UROXATRAL) 10 MG 24 hr tablet Take 10 mg by mouth at bedtime.     [name] amLODipine (NORVASC) 10 MG tablet Take 10 mg by mouth daily.    [name] amLODipine (NORVASC) 5 MG  tablet     . Ascorbic Acid (SUPER C COMPLEX PO) Take 1 capsule by mouth every evening. Super C 1000    . Calcium Carb-Cholecalciferol (CALCIUM 600+D3 PO) Take 1 tablet by mouth every evening.    . Cyanocobalamin (VITAMIN B-12) 5000 MCG SUBL Place 1 tablet under the tongue every evening.     . Ferrous Sulfate (IRON) 28 MG TABS Take 28 mg by mouth every evening.    . finasteride (PROSCAR) 5 MG tablet Take 5 mg by mouth at bedtime.     . hydrochlorothiazide (HYDRODIURIL) 12.5 MG tablet Take 12.5 mg by mouth daily.    . hydrocortisone (ANUSOL-HC) 25 MG suppository Place 1 suppository (25 mg total) rectally 2 (two) times daily. 12 suppository 0  . Omega-3 Fatty Acids (FISH OIL) 1200 MG CAPS Take 1,200 mg by mouth every evening.    . ondansetron (ZOFRAN) 8 MG tablet Take 1 tablet (8 mg total) by mouth every 8 (eight) hours as needed for nausea. 20 tablet 0  . Polyethyl Glycol-Propyl Glycol 0.4-0.3 % SOLN Place 1 drop into both eyes 2 (two) times daily as needed (for dry/irritated eyes.).    [name] traMADol (ULTRAM) 50 MG tablet Take 1 tablet (50 mg total) by mouth every 6 (six) hours as needed (pain). (Patient taking differently: Take 25 mg by mouth 2 (two) times daily.) 28 tablet 0  . triamcinolone ointment (KENALOG) 0.5 % APPLY TO ITCHY RASH ON EXTREMITIES AND WAIST TWICE A DAY FOR 15 DAYS. DO NOT APPLY TO FACE 30 g 0  . Vitamin Mixture (SELENIUM-VITAMIN E) 50-400 MCG-UNIT CAPS Take 1 capsule by mouth every evening.    Alveda Reasons 20 MG TABS tablet TAKE 1 TABLET (20 MG TOTAL) BY MOUTH DAILY WITH SUPPER. 90 tablet 1  . simvastatin (ZOCOR) 20 MG tablet Take 20 mg by mouth every evening.     No facility-administered medications prior to visit.     Allergies:   Levaquin [levofloxacin in d5w] and Penicillins   Social History   Socioeconomic History  . Marital status: Married    Spouse name: Not on file  . Number of children: 2  . Years of education: Not on file  . Highest education level: Not on file   Occupational History  . Not on file  Tobacco Use  . Smoking status: Former Smoker    Packs/day: 0.50    Years: 9.00    Pack years: 4.50    Types: Cigarettes    Quit date: 12/02/1967    Years since quitting: 53.0  . Smokeless tobacco: Former Systems developer    Types: Chew    Quit date: 2000  Media planner  . Vaping Use: Never used  Substance and Sexual Activity  . Alcohol use: No  . Drug use: No  . Sexual activity: Never  Other Topics Concern  . Not on file  Social History Narrative   Patient is right handed.   Patient drinks 1 cup caffeine daily.      Pt has been married for 52 years and has 2 children, 4 grandchildren, and  3 great-grandchildren. Lives with everyone except great-grandchildren. He does not clean house, but can shop, do yard work, and drive. 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         Epworth Sleepiness Scale Score:  8      --I have HTN   --I seem to be losing my sex drive   --I wake up to urinate frequently at night   --I awake feeling not rested   --I have borderline diabetes   Social Determinants of Health   Financial Resource Strain: Not on file  Food Insecurity: Not on file  Transportation Needs: Not on file  Physical Activity: Not on file  Stress: Not on file  Social Connections: Not on file     Family History:  The patient's family history includes Cancer in his mother; Cancer - Lung in his father; Diabetes in his brother; Heart attack in his brother.   ROS:   Please see the history of present illness.   All other systems are reviewed and are negative.   PHYSICAL EXAM:   VS:  BP (!) 146/76   Pulse 63   Ht 5\' 10"  (1.778 m)   Wt 195 lb (88.5 kg)   SpO2 97%   BMI 27.98 kg/m     General: Alert, oriented x3, no distress Head: no evidence of trauma, PERRL, EOMI, no exophtalmos or lid lag, no myxedema, no xanthelasma; normal ears, nose and oropharynx Neck: normal jugular venous pulsations and no hepatojugular reflux; brisk  carotid pulses without delay and no carotid bruits Chest: clear to auscultation, no signs of consolidation by percussion or palpation, normal fremitus, symmetrical and full respiratory excursions Cardiovascular: normal position and quality of the apical impulse, irregular rhythm, normal first and second heart sounds, no murmurs, rubs or gallops Abdomen: no tenderness or distention, no masses by palpation, no abnormal pulsatility or arterial bruits, normal bowel sounds, no hepatosplenomegaly Extremities: no clubbing, cyanosis or edema; 2+ radial, ulnar and brachial pulses bilaterally; 2+ right femoral, posterior tibial and dorsalis pedis pulses; 2+ left femoral, posterior tibial and dorsalis pedis pulses; no subclavian or femoral bruits Neurological: grossly nonfocal Psych: Normal mood and affect   Wt Readings from Last 3 Encounters:  11/16/20 195 lb (88.5 kg)  07/25/20 188 lb 14.4 oz (85.7 kg)  05/02/20 184 lb 14.4 oz (83.9 kg)      Studies/Labs Reviewed:  09 26 2020 lower extremity arterial Dopplers  without evidence of obstruction  EKG:  EKG is ordered today. It shows atrial fibrillation at 71 bpm, otw normal. Recent Labs: 07/25/2020: ALT 27; BUN 17; Creatinine 1.10; Hemoglobin 13.9; Platelets 131; Potassium 3.9; Sodium 137   Lipid Panel No results found for: CHOL, TRIG, HDL, CHOLHDL, VLDL, LDLCALC, LDLDIRECT, LABVLDL 09/27/2019  total cholesterol 125, HDL 48, LDL 71, triglycerides 33 Hemoglobin A1c 6.3%, hemoglobin 12.2, creatinine 1.12, potassium 4.2, TSH 1.33  10/01/2020 total cholesterol 139, HDL 54, LDL 76, triglycerides 36   ASSESSMENT:    1. Permanent atrial fibrillation ([place])   2. Long term current use of anticoagulant   3. Coronary artery calcification seen on CT scan   4. Marginal zone lymphoma ([place])   5. Essential hypertension   6. Pure hypercholesterolemia      PLAN:  In order of problems listed above:   1. AFib: permanent, spontaneously rate controlled,  meaning he has underlying conduction system disease.   CHADSVasc at least 3, possibly as high as 5 (age 89, HTN, +/-CAD, +/- borderline  DM).   2. Anticoagulation: no bleeding or falls recently. On lower dose Xarelto due to easy bruising, advanced age (he will be 86 in 2 months), history of falls. 3. Coronary calcification: asymptomatic. Normal Lexiscan Myoview in March 2017.   4. 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 Last f/u w Dr. Irene Limbo was in September. Patient canceled f/u PET. 5. HTN: per home BP, adequate control. No change in meds. 6. HLP: On statin. Recent LDL 76. Ideally <70, but will not change meds.  Medication Adjustments/Labs and Tests Ordered: Current medicines are reviewed at length with the patient today.  Concerns regarding medicines are outlined above.  Medication changes, Labs and Tests ordered today are listed in the Patient Instructions below. Patient Instructions  Medication Instructions:  STOP the Simvastatin START the Atorvastatin 20 mg once daily  Please call us with the Amlodipine dosage at [phone]  *If you need a refill on your cardiac medications before your next appointment, please call your pharmacy*   Lab Work: None ordered If you have labs (blood work) drawn today and your tests are completely normal, you will receive your results only by: [name] MyChart Message (if you have MyChart) OR . A paper copy in the mail If you have any lab test that is abnormal or we need to change your treatment, we will call you to review the results.   Testing/Procedures: None ordered   Follow-Up: At [facility], you and your health needs are our priority.  As part of our continuing mission to provide you with exceptional heart care, we have created designated Provider Care Teams.  These Care Teams include your primary Cardiologist (physician) and Advanced Practice Providers (APPs -  Physician Assistants and Nurse  Practitioners) who all work together to provide you with the care you need, when you need it.  We recommend signing up for the patient portal called "MyChart".  Sign up information is provided on this After Visit Summary.  MyChart is used to connect with patients for Virtual Visits (Telemedicine).  Patients are able to view lab/test results, encounter notes, upcoming appointments, etc.  Non-urgent messages can be sent to your provider as well.   To learn more about what you can do with MyChart, go to [email or web address].    Your next appointment:   12 month(s)  The format for your next appointment:   In Person  Provider:   You may see James Klein, MD or one of the following Advanced Practice Providers on your designated Care Team:    Almyra Deforest, PA-C  Fabian Sharp, [place] or   Roby Lofts, PA-C       Signed, James Klein, MD  11/18/2020 10:32 AM    [address], [place], [place]  [address] Phone: [phone]; Fax: [phone]

## 2020-11-18 ENCOUNTER — Encounter: Payer: Self-pay | Admitting: Cardiovascular Disease

## 2020-12-11 ENCOUNTER — Other Ambulatory Visit: Payer: Self-pay

## 2020-12-11 ENCOUNTER — Ambulatory Visit (INDEPENDENT_AMBULATORY_CARE_PROVIDER_SITE_OTHER): Payer: Medicare HMO | Admitting: Podiatry

## 2020-12-11 DIAGNOSIS — M79674 Pain in right toe(s): Secondary | ICD-10-CM

## 2020-12-11 DIAGNOSIS — L84 Corns and callosities: Secondary | ICD-10-CM

## 2020-12-11 DIAGNOSIS — R52 Pain, unspecified: Secondary | ICD-10-CM

## 2020-12-11 DIAGNOSIS — E1142 Type 2 diabetes mellitus with diabetic polyneuropathy: Secondary | ICD-10-CM | POA: Diagnosis not present

## 2020-12-11 DIAGNOSIS — M79675 Pain in left toe(s): Secondary | ICD-10-CM

## 2020-12-11 DIAGNOSIS — B351 Tinea unguium: Secondary | ICD-10-CM

## 2020-12-12 ENCOUNTER — Encounter: Payer: Self-pay | Admitting: Podiatry

## 2020-12-12 NOTE — Progress Notes (Signed)
  Subjective:  Patient ID: James Sweeney, male    DOB: May 26, 1941,  MRN: [id number]  Chief Complaint  Patient presents with  . Nail Problem    Routine foot care     80 y.o. male returns with the above complaint. History confirmed with patient.   Objective:  Physical Exam: warm, good capillary refill, no trophic changes or ulcerative lesions, normal DP and PT pulses, normal sensory exam and onychomycosis x10 with subungual debris, yellow discoloration thickening the nail plate and dystrophy.  Multiple porokeratosis plantar forefoot bilaterally.  Assessment:   1. Onychomycosis   2. Pain due to onychomycosis of toenails of both feet   3. Callus of foot   4. Pain   5. Type 2 diabetes mellitus with diabetic polyneuropathy, without long-term current use of insulin ([place])      Plan:  Patient was evaluated and treated and all questions answered.  Patient educated on diabetes. Discussed proper diabetic foot care and discussed risks and complications of disease. Educated patient in depth on reasons to return to the office immediately should he/she discover anything concerning or new on the feet. All questions answered. Discussed proper shoes as well.   Discussed the etiology and treatment options for the condition in detail with the patient. Educated patient on the topical and oral treatment options for mycotic nails. Recommended debridement of the nails today. Sharp and mechanical debridement performed of all painful and mycotic nails today. Nails debrided in length and thickness using a nail nipper to level of comfort. Discussed treatment options including appropriate shoe gear. Follow up as needed for painful nails.   Return in about 3 months (around 03/10/2021) for at risk diabetic foot care.

## 2021-01-20 ENCOUNTER — Other Ambulatory Visit: Payer: Self-pay | Admitting: Cardiovascular Disease

## 2021-01-21 NOTE — Progress Notes (Signed)
HEMATOLOGY/ONCOLOGY CLINIC NOTE  Date of Service: 01/22/2021  Patient Care Team: Antony Contras, MD as PCP - General (Family Medicine)  CHIEF COMPLAINTS/PURPOSE OF CONSULTATION:  Extranodal Marginal Zone MALT Lymphoma  HISTORY OF PRESENTING ILLNESS:   James Sweeney is a wonderful 80 y.o. male who has been referred to [name] by Dr Antony Contras for evaluation and management of Extranodal Marginal Zone MALT Lymphoma. He is accompanied today by his wife. The pt reports that he is doing well overall.   The pt notes that about 4-5 years ago his urologist first discovered a small mass in his lung. He had a CT  in June 2016 which showed this mass. A navigation bronchoscopy EBUS was performed in March 2017 that did not reveal a definitive pathology. A repeat bx (VATS  With wedge resection of  RML) biopsy was completed on 04/12/18 which revealed an Extranodal marginal zone lymphoma. He denies any constitutional symptoms. The pt reports that he still has some soreness at his surgical site but denies any difficulty breathing.   He is taking Xarelto for Afib.   He last had pneumonia last summer 2018, and has had pneumonia twice. He maintains regular follow up with his PCP Dr Antony Contras.   Most recent lab results (04/12/18) of CBC  is as follows: all values are WNL except for RBC at 3.44, HGB at 11.8, HCT at 35.3, MCV at 102.6, MCH at 34.3, PLT at 126k.   On review of systems, pt reports good energy levels, chest soreness at surgical site, improving breathing, constipation, and denies fevers, chills, night sweats, unexpected weight loss, abdominal pains, leg swelling, and any other symptoms.   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 He notes having breathed in methanol/ethanol exhaust fumes in his work at times.   Interval History:   James Sweeney returns today for management and evaluation of his Extranodal Marginal Zone MALT lymphoma. We are joined today by his wife.  The patient's last visit with [name] was on 07/25/2020. The pt reports that he is doing well overall.  The pt reports that his ankles and legs are very painful. The pt notes they get cold at night. He notes this pain is mostly laying down at night while he is trying to sleep. The pt notes he averages two hours of sleep a night. The pt notes he takes half a Tramadol (25 mg) at morning and night. The pt notes he was unaware he needed to get the PET scan and was not called regarding scheduling this. The pt notes he changed PCP and is now going to [facility] and not Dr. Moreen Fowler.  Lab results today 01/22/2021 of CBC w/diff and CMP is as follows: all values are WNL except for WBC of 2.5K, RBC of 3.65, HCT of 38.5, MCV of 105.5, MCH of 36.4, Plt of 115K, Lymphs Abs of 0.5K. CMP pending. 01/22/2021 [facility] . Lab Results  Component Value Date   LDH 175 01/22/2021   On review of systems, pt reports ankle and leg pain and denies changes in breathing, SOB, fevers, chills, night sweats, headaches, decreased appetite, sudden weight loss, changes in bowel habits, leg swelling, abdominal pain, and any other symptoms.  MEDICAL HISTORY:  Past Medical History:  Diagnosis Date  . Cervical spondylosis without myelopathy 12/01/2013  . Diabetes mellitus    borderline  . Enlarged prostate   . Frequency of urination   . Hearing deficit   .  Heart murmur   . History of kidney stones    removed with Lithotripsy  . HOH (hard of hearing)    right ear  . Hyperlipemia   . Hypertension   . Lumbago 12/01/2013  . Pneumonia 2016  . Polio    right leg smaller than left  . Shortness of breath dyspnea    with exertion    SURGICAL HISTORY: Past Surgical History:  Procedure Laterality Date  . CATARACT EXTRACTION Bilateral 2014  . LUNG BIOPSY Right 2018   March  . VIDEO ASSISTED THORACOSCOPY (VATS)/WEDGE RESECTION Right 04/12/2018   Procedure: VIDEO ASSISTED THORACOSCOPY (VATS), WEDGE RESECTION RIGHT MIDDLE LOBE, PLACEMENT  OF ON Q CATHETER;  Surgeon: [name], MD;  Location: [place];  Service: Thoracic;  Laterality: Right;  [name] VIDEO BRONCHOSCOPY N/A 04/12/2018   Procedure: VIDEO BRONCHOSCOPY;  Surgeon: [name], MD;  Location: [place];  Service: Thoracic;  Laterality: N/A;  . VIDEO BRONCHOSCOPY WITH ENDOBRONCHIAL NAVIGATION N/A 01/25/2016   Procedure: VIDEO BRONCHOSCOPY WITH ENDOBRONCHIAL NAVIGATION;  Surgeon: [name], MD;  Location: [place];  Service: Thoracic;  Laterality: N/A;  . VIDEO BRONCHOSCOPY WITH ENDOBRONCHIAL ULTRASOUND N/A 01/25/2016   Procedure: VIDEO BRONCHOSCOPY WITH ENDOBRONCHIAL ULTRASOUND;  Surgeon: [name], MD;  Location: [place];  Service: Thoracic;  Laterality: N/A;    SOCIAL HISTORY: Social History   Socioeconomic History  . Marital status: Married    Spouse name: Not on file  . Number of children: 2  . Years of education: Not on file  . Highest education level: Not on file  Occupational History  . Not on file  Tobacco Use  . Smoking status: Former Smoker    Packs/day: 0.50    Years: 9.00    Pack years: 4.50    Types: Cigarettes    Quit date: 12/02/1967    Years since quitting: 53.1  . Smokeless tobacco: Former Systems developer    Types: Chew    Quit date: 2000  Media planner  . Vaping Use: Never used  Substance and Sexual Activity  . Alcohol use: No  . Drug use: No  . Sexual activity: Never  Other Topics Concern  . Not on file  Social History Narrative   Patient is right handed.   Patient drinks 1 cup caffeine daily.      Pt has been married for 22 years and has 2 children, 4 grandchildren, and 3 great-grandchildren. Lives with everyone except great-grandchildren. He does not clean house, but can shop, do yard work, and drive. 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         Epworth Sleepiness Scale Score:  8      --I have HTN   --I seem to be losing my sex drive   --I wake up to urinate frequently at night   --I awake feeling  not rested   --I have borderline diabetes   Social Determinants of Health   Financial Resource Strain: Not on file  Food Insecurity: Not on file  Transportation Needs: Not on file  Physical Activity: Not on file  Stress: Not on file  Social Connections: Not on file  Intimate Partner Violence: Not on file    FAMILY HISTORY: Family History  Problem Relation Age of Onset  . Cancer Mother        Stomach  . Cancer - Lung Father   . Heart attack Brother   . Diabetes Brother     ALLERGIES:  is allergic to levaquin [levofloxacin in d5w] and penicillins.  MEDICATIONS:  Current Outpatient Medications  Medication Sig Dispense Refill  . alfuzosin (UROXATRAL) 10 MG 24 hr tablet Take 10 mg by mouth at bedtime.     [name] amLODipine (NORVASC) 10 MG tablet Take 10 mg by mouth daily.    [name] amLODipine (NORVASC) 5 MG tablet     . Ascorbic Acid (SUPER C COMPLEX PO) Take 1 capsule by mouth every evening. Super C 1000    . atorvastatin (LIPITOR) 20 MG tablet Take 1 tablet (20 mg total) by mouth daily. 90 tablet 3  . Calcium Carb-Cholecalciferol (CALCIUM 600+D3 PO) Take 1 tablet by mouth every evening.    . Cyanocobalamin (VITAMIN B-12) 5000 MCG SUBL Place 1 tablet under the tongue every evening.     . Ferrous Sulfate (IRON) 28 MG TABS Take 28 mg by mouth every evening.    . finasteride (PROSCAR) 5 MG tablet Take 5 mg by mouth at bedtime.     . hydrochlorothiazide (HYDRODIURIL) 12.5 MG tablet Take 12.5 mg by mouth daily.    . hydrocortisone (ANUSOL-HC) 25 MG suppository Place 1 suppository (25 mg total) rectally 2 (two) times daily. 12 suppository 0  . Omega-3 Fatty Acids (FISH OIL) 1200 MG CAPS Take 1,200 mg by mouth every evening.    . ondansetron (ZOFRAN) 8 MG tablet Take 1 tablet (8 mg total) by mouth every 8 (eight) hours as needed for nausea. 20 tablet 0  . Polyethyl Glycol-Propyl Glycol 0.4-0.3 % SOLN Place 1 drop into both eyes 2 (two) times daily as needed (for dry/irritated eyes.).    [name]  traMADol (ULTRAM) 50 MG tablet Take 1 tablet (50 mg total) by mouth every 6 (six) hours as needed (pain). (Patient taking differently: Take 25 mg by mouth 2 (two) times daily.) 28 tablet 0  . triamcinolone ointment (KENALOG) 0.5 % APPLY TO ITCHY RASH ON EXTREMITIES AND WAIST TWICE A DAY FOR 15 DAYS. DO NOT APPLY TO FACE 30 g 0  . Vitamin Mixture (SELENIUM-VITAMIN E) 50-400 MCG-UNIT CAPS Take 1 capsule by mouth every evening.    Alveda Reasons 20 MG TABS tablet TAKE 1 TABLET (20 MG TOTAL) BY MOUTH DAILY WITH SUPPER. 90 tablet 1   No current facility-administered medications for this visit.    REVIEW OF SYSTEMS:   10 Point review of Systems was done is negative except as noted above.  PHYSICAL EXAMINATION: ECOG PERFORMANCE STATUS: 1 - Symptomatic but completely ambulatory   Exam was given in a chair. .BP 140/63 (BP Location: Left Arm, Patient Position: Sitting)   Pulse 88   Temp (!) 97.5 F (36.4 C) (Tympanic)   Resp 14   Ht 5\' 10"  (1.778 m)   Wt 189 lb 11.2 oz (86 kg)   SpO2 98%   BMI 27.22 kg/m   GENERAL:alert, in no acute distress and comfortable SKIN: no acute rashes, no significant lesions EYES: conjunctiva are pink and non-injected, sclera anicteric OROPHARYNX: MMM, no exudates, no oropharyngeal erythema or ulceration NECK: supple, no JVD LYMPH:  no palpable lymphadenopathy in the cervical, axillary or inguinal regions LUNGS: clear to auscultation b/l with normal respiratory effort HEART: regular rate & rhythm ABDOMEN:  normoactive bowel sounds , non tender, not distended. Extremity: no pedal edema PSYCH: alert & oriented x 3 with fluent speech NEURO: no focal motor/sensory deficits   LABORATORY DATA:  I have reviewed the data as listed  . CBC Latest Ref Rng & Units 01/22/2021 07/25/2020 05/02/2020  WBC 4.0 - 10.5 K/uL 2.5(L) 3.4(L) 2.8(L)  Hemoglobin 13.0 - 17.0 g/dL 13.3 13.9 12.1(L)  Hematocrit 39.0 - 52.0 % 38.5(L) 40.4 35.2(L)  Platelets 150 - 400 K/uL 115(L) 131(L)  169    . CMP Latest Ref Rng & Units 07/25/2020 05/02/2020 04/25/2020  Glucose 70 - 99 mg/dL 188(H) 169(H) 221(H)  BUN 8 - 23 mg/dL 17 15 16   Creatinine 0.61 - 1.24 mg/dL 1.10 1.10 1.19  Sodium 135 - 145 mmol/L 137 139 137  Potassium 3.5 - 5.1 mmol/L 3.9 4.0 4.0  Chloride 98 - 111 mmol/L 103 104 105  CO2 22 - 32 mmol/L 30 28 24   Calcium 8.9 - 10.3 mg/dL 9.1 8.8(L) 8.4(L)  Total Protein 6.5 - 8.1 g/dL 6.5 6.3(L) 6.2(L)  Total Bilirubin 0.3 - 1.2 mg/dL 0.8 0.6 0.7  Alkaline Phos 38 - 126 U/L 79 108 101  AST 15 - 41 U/L 23 34 26  ALT 0 - 44 U/L 27 66(H) 49(H)   . Lab Results  Component Value Date   LDH 175 01/22/2021    04/12/18 Flow Cytometry:   04/12/18 Lung biopsy:     RADIOGRAPHIC STUDIES: I have personally reviewed the radiological images as listed and agreed with the findings in the report. No results found.  ASSESSMENT & PLAN:   80 y.o. male with  1. 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  If this is an isolated lesion this would make in Stage IE.  04/12/18 bx/resection of the right middle lobe revealed an extranodal marginal zone lymphoma   07/21/18 PET/CT revealed Interval resection of the RIGHT middle lobe pulmonary nodule. 2. Increase thickening of subpleural tissue in the RIGHT upper lobe with increased metabolic activity. In patient with lymphoma history, cannot exclude atypical thoracic lymphoma. 3. No change in small hypermetabolic mediastinal lymph nodes which are favored reactive. 4. Prostatomegaly with nodularity. 5. Stable RIGHT adrenal mass. Favor benign.  03/11/19 CT Chest revealed "Ill-defined ground-glass and consolidative nodules in both lungs remain stable, except for mild increase in size of 1.6 cm central right perihilar nodule. Consider continued followup by chest CT in 6 months. 2. Stable mild partially-calcified mediastinal and bilateral hilar lymphadenopathy. 3. Stable 2.6 cm right adrenal mass,, likely a benign adenoma. Recommend  continued attention on follow-up CT. 4. Aortic and coronary artery atherosclerosis."   09/18/2019 CT Angio Chest/Abd/Pel ([id number]) revealed "1. No acute vascular findings. 2. Trace new right pleural effusion, cause uncertain. 3. Similar sub solid nodules and sub solid right subpleural airspace opacity. In this patient with history of lymphoma, the possibility of atypical pulmonary involvement is not readily excluded. Surveillance imaging of the lung lesions is recommended. 4. Nonspecific 1.8 cm focus of arterial phase enhancement in segment 2 of the liver. There is no prior accentuated metabolic activity in this vicinity on prior PET-CT of 07/21/2018. Possibilities for further workup of this lesion include hepatic protocol MRI with and without contrast, or surveillance on follow up imaging related to the patient's lymphoma. 5. Chronically stable right adrenal mass, likely benign. 6. Other imaging findings of potential clinical significance: Aortic Atherosclerosis (ICD10-I70.0). Coronary atherosclerosis. Mild cardiomegaly. Calcified mediastinal adenopathy, stable. Postoperative findings in the right upper lobe. Stable small well-defined foci of sclerosis in the right fourth rib. Chronic superior endplate compression at L1. Right foraminal stenosis at L4-5 due to spurring. Lipoma deep to the left gluteus maximus muscle, stable, this mildly abuts the left sciatic nerve. Prostatomegaly."  03/12/2020 PET/CT ([id number]) revealed "1. Interval increase in size  and FDG uptake associated with subpleural soft tissue within the anterior right upper lobe compatible with Deauville criteria 4/5 disease. 2. Paravertebral soft tissue within the posterior mediastinum medial to the descending aorta exhibits increased FDG uptake. Deauville criteria 4. 3. Mild increase in size and degree of FDG uptake associated with bilateral pulmonary nodules. 4. Similar mild to moderate increased FDG uptake associated with calcified  mediastinal and hilar lymph nodes. This is a nonspecific finding and may reflect changes of granulomatous inflammation or infection. 5. Similar appearance of indeterminate right adrenal nodule which exhibits mild to moderate FDG uptake with SUV max of 5.4 6. No significant FDG uptake within the liver."  03/12/2020 MRI Liver ([id number]) revealed benign hemangioma, no hypermetabolic lesion  2) Acute Bronchitis  PLAN:  -Discussed pt labwork today, 01/22/2021; blood counts stable, WBC slightly lower, Plt stable.  -Will try to get repeat PET scan in the next few weeks. Will monitor visits following the results of this. -Recommended pt see ENT regarding chronic hearing loss and newer hearing aids. -No lab or clinical evidence of MALT lymphoma recurrence/progression at this time. Will continue watchful observation.  -Will see back in 3 weeks via phone call with results of PET. -Will see back in 6 months with labs.    2.  Patient Active Problem List   Diagnosis Date Noted  . History of hearing loss 06/21/2020  . Impacted cerumen of left ear 06/21/2020  . Marginal zone lymphoma ([place]) 03/25/2020  . Counseling regarding advance care planning and goals of care 03/25/2020  . Renal insufficiency 04/19/2018  . Chronic anticoagulation 04/19/2018  . Lung mass 04/12/2018  . Permanent atrial fibrillation ([place]) 01/19/2018  . Pneumothorax 01/25/2016  . Pneumothorax after biopsy 01/25/2016  . Coronary artery calcification seen on CT scan 01/21/2016  . Murmur, cardiac 01/21/2016  . Hypercholesterolemia 01/21/2016  . Essential hypertension 01/09/2016  . BPH (benign prostatic hyperplasia) 01/09/2016  . Solitary pulmonary nodule 01/09/2016  . Thrombocytopenia ([place]) 01/09/2016  . Cervical spondylosis without myelopathy 12/01/2013  . Lumbago 12/01/2013  . Diabetes mellitus     FOLLOW UP: -PET/CT in 1-2 weeks -phone visit in 3 weeks -Will see back in 6 months with labs     The total time spent in  the appointment was 20 minutes and more than 50% was on counseling and direct patient cares.    All of the patient's questions were answered with apparent satisfaction. The patient knows to call the clinic with any problems, questions or concerns.    Sullivan Lone MD [place] AAHIVMS [facility] Hematology/Oncology Physician [facility]  (Office):       [phone] (Work cell):  [phone] (Fax):           [phone]  01/22/2021 11:27 AM  I, Reinaldo Raddle, am acting as scribe for Dr. Sullivan Lone, MD.      .I have reviewed the above documentation for accuracy and completeness, and I agree with the above. Brunetta Genera MD

## 2021-01-22 ENCOUNTER — Other Ambulatory Visit: Payer: Self-pay

## 2021-01-22 ENCOUNTER — Inpatient Hospital Stay (HOSPITAL_BASED_OUTPATIENT_CLINIC_OR_DEPARTMENT_OTHER): Payer: Medicare HMO | Admitting: Hematology

## 2021-01-22 ENCOUNTER — Inpatient Hospital Stay: Payer: Medicare HMO | Attending: Hematology

## 2021-01-22 VITALS — BP 140/63 | HR 88 | Temp 97.5°F | Resp 14 | Ht 70.0 in | Wt 189.7 lb

## 2021-01-22 DIAGNOSIS — I4891 Unspecified atrial fibrillation: Secondary | ICD-10-CM | POA: Diagnosis not present

## 2021-01-22 DIAGNOSIS — Z801 Family history of malignant neoplasm of trachea, bronchus and lung: Secondary | ICD-10-CM | POA: Insufficient documentation

## 2021-01-22 DIAGNOSIS — Z833 Family history of diabetes mellitus: Secondary | ICD-10-CM | POA: Diagnosis not present

## 2021-01-22 DIAGNOSIS — Z79899 Other long term (current) drug therapy: Secondary | ICD-10-CM | POA: Insufficient documentation

## 2021-01-22 DIAGNOSIS — C8580 Other specified types of non-Hodgkin lymphoma, unspecified site: Secondary | ICD-10-CM

## 2021-01-22 DIAGNOSIS — J209 Acute bronchitis, unspecified: Secondary | ICD-10-CM | POA: Diagnosis not present

## 2021-01-22 DIAGNOSIS — Z800 Family history of malignant neoplasm of digestive organs: Secondary | ICD-10-CM | POA: Diagnosis not present

## 2021-01-22 DIAGNOSIS — I517 Cardiomegaly: Secondary | ICD-10-CM | POA: Insufficient documentation

## 2021-01-22 DIAGNOSIS — C884 Extranodal marginal zone B-cell lymphoma of mucosa-associated lymphoid tissue [MALT-lymphoma]: Secondary | ICD-10-CM | POA: Insufficient documentation

## 2021-01-22 DIAGNOSIS — I700 Atherosclerosis of aorta: Secondary | ICD-10-CM | POA: Diagnosis not present

## 2021-01-22 DIAGNOSIS — Z8249 Family history of ischemic heart disease and other diseases of the circulatory system: Secondary | ICD-10-CM | POA: Diagnosis not present

## 2021-01-22 DIAGNOSIS — Z87891 Personal history of nicotine dependence: Secondary | ICD-10-CM | POA: Insufficient documentation

## 2021-01-22 DIAGNOSIS — I10 Essential (primary) hypertension: Secondary | ICD-10-CM | POA: Diagnosis not present

## 2021-01-22 LAB — CBC WITH DIFFERENTIAL/PLATELET
Abs Immature Granulocytes: 0 10*3/uL (ref 0.00–0.07)
Basophils Absolute: 0 10*3/uL (ref 0.0–0.1)
Basophils Relative: 0 %
Eosinophils Absolute: 0.1 10*3/uL (ref 0.0–0.5)
Eosinophils Relative: 2 %
HCT: 38.5 % — ABNORMAL LOW (ref 39.0–52.0)
Hemoglobin: 13.3 g/dL (ref 13.0–17.0)
Immature Granulocytes: 0 %
Lymphocytes Relative: 20 %
Lymphs Abs: 0.5 10*3/uL — ABNORMAL LOW (ref 0.7–4.0)
MCH: 36.4 pg — ABNORMAL HIGH (ref 26.0–34.0)
MCHC: 34.5 g/dL (ref 30.0–36.0)
MCV: 105.5 fL — ABNORMAL HIGH (ref 80.0–100.0)
Monocytes Absolute: 0.1 10*3/uL (ref 0.1–1.0)
Monocytes Relative: 4 %
Neutro Abs: 1.8 10*3/uL (ref 1.7–7.7)
Neutrophils Relative %: 74 %
Platelets: 115 10*3/uL — ABNORMAL LOW (ref 150–400)
RBC: 3.65 MIL/uL — ABNORMAL LOW (ref 4.22–5.81)
RDW: 13 % (ref 11.5–15.5)
WBC: 2.5 10*3/uL — ABNORMAL LOW (ref 4.0–10.5)
nRBC: 0 % (ref 0.0–0.2)

## 2021-01-22 LAB — CMP (CANCER CENTER ONLY)
ALT: 35 U/L (ref 0–44)
AST: 23 U/L (ref 15–41)
Albumin: 4.1 g/dL (ref 3.5–5.0)
Alkaline Phosphatase: 95 U/L (ref 38–126)
Anion gap: 9 (ref 5–15)
BUN: 22 mg/dL (ref 8–23)
CO2: 30 mmol/L (ref 22–32)
Calcium: 9 mg/dL (ref 8.9–10.3)
Chloride: 102 mmol/L (ref 98–111)
Creatinine: 1.37 mg/dL — ABNORMAL HIGH (ref 0.61–1.24)
GFR, Estimated: 52 mL/min — ABNORMAL LOW (ref >60)
Glucose, Bld: 177 mg/dL — ABNORMAL HIGH (ref 70–99)
Potassium: 4 mmol/L (ref 3.5–5.1)
Sodium: 141 mmol/L (ref 135–145)
Total Bilirubin: 1.1 mg/dL (ref 0.3–1.2)
Total Protein: 6.9 g/dL (ref 6.5–8.1)

## 2021-01-22 LAB — LACTATE DEHYDROGENASE: LDH: 175 U/L (ref 98–192)

## 2021-02-12 ENCOUNTER — Ambulatory Visit (HOSPITAL_COMMUNITY)
Admission: RE | Admit: 2021-02-12 | Discharge: 2021-02-12 | Disposition: A | Discharge status: Home / Self Care | Payer: PPO | Source: Ambulatory Visit | Attending: Hematology | Admitting: Hematology

## 2021-02-12 ENCOUNTER — Other Ambulatory Visit: Payer: Self-pay

## 2021-02-12 DIAGNOSIS — C8580 Other specified types of non-Hodgkin lymphoma, unspecified site: Secondary | ICD-10-CM | POA: Insufficient documentation

## 2021-02-12 DIAGNOSIS — E279 Disorder of adrenal gland, unspecified: Secondary | ICD-10-CM | POA: Diagnosis not present

## 2021-02-12 DIAGNOSIS — I700 Atherosclerosis of aorta: Secondary | ICD-10-CM | POA: Insufficient documentation

## 2021-02-12 DIAGNOSIS — C8590 Non-Hodgkin lymphoma, unspecified, unspecified site: Secondary | ICD-10-CM | POA: Diagnosis not present

## 2021-02-12 DIAGNOSIS — I2510 Atherosclerotic heart disease of native coronary artery without angina pectoris: Secondary | ICD-10-CM | POA: Diagnosis not present

## 2021-02-12 LAB — GLUCOSE, CAPILLARY: Glucose-Capillary: 96 mg/dL (ref 70–99)

## 2021-02-12 MED ORDER — FLUDEOXYGLUCOSE F - 18 (FDG) INJECTION
9.0000 | Freq: Once | INTRAVENOUS | Status: AC | PRN
  Administered 2021-02-12: 9.59 via INTRAVENOUS

## 2021-02-12 NOTE — Progress Notes (Signed)
HEMATOLOGY/ONCOLOGY CLINIC NOTE  Date of Service: 02/14/2021  Patient Care Team: Lois Huxley, PA as PCP - General (Family Medicine)  CHIEF COMPLAINTS/PURPOSE OF CONSULTATION:  Extranodal Marginal Zone MALT Lymphoma  HISTORY OF PRESENTING ILLNESS:   James Sweeney is a wonderful 80 y.o. male who has been referred to [name] by Dr Antony Contras for evaluation and management of Extranodal Marginal Zone MALT Lymphoma. He is accompanied today by his wife. The pt reports that he is doing well overall.   The pt notes that about 4-5 years ago his urologist first discovered a small mass in his lung. He had a CT  in June 2016 which showed this mass. A navigation bronchoscopy EBUS was performed in March 2017 that did not reveal a definitive pathology. A repeat bx (VATS  With wedge resection of  RML) biopsy was completed on 04/12/18 which revealed an Extranodal marginal zone lymphoma. He denies any constitutional symptoms. The pt reports that he still has some soreness at his surgical site but denies any difficulty breathing.   He is taking Xarelto for Afib.   He last had pneumonia last summer 2018, and has had pneumonia twice. He maintains regular follow up with his PCP Dr Antony Contras.   Most recent lab results (04/12/18) of CBC  is as follows: all values are WNL except for RBC at 3.44, HGB at 11.8, HCT at 35.3, MCV at 102.6, MCH at 34.3, PLT at 126k.   On review of systems, pt reports good energy levels, chest soreness at surgical site, improving breathing, constipation, and denies fevers, chills, night sweats, unexpected weight loss, abdominal pains, leg swelling, and any other symptoms.   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 He notes having breathed in methanol/ethanol exhaust fumes in his work at times.   INTERVAL HISTORY:   I connected with James Sweeney on 02/14/2021 by telephone and verified that I am speaking with the correct person using two  identifiers.   I discussed the limitations of evaluation and management by telemedicine. The patient expressed understanding and agreed to proceed.   Other persons participating in the visit and their role in the encounter:                                                         - Reinaldo Raddle, Medical Scribe    Patient's location: Home Provider's location: [place] at [employer] returns today for management and evaluation of his Extranodal Marginal Zone MALT lymphoma. We are joined today by his wife. The patient's last visit with [name] was on 01/22/2021. The pt reports that he is doing well overall.  The pt reports no new symptoms or concerns. The pt notes that he is slightly more SOB over the last two months. The pt notes he has slowed down and this has helped him. The pt notes his current problem is that his ankles start hurting when he lays down. The pt notes that his legs do not bother him when moving. The pt notes that he has recently switched PCP due to billing and other issues. The pt notes he has a visit with his new PCP in the next upcoming weeks.  Of note since the patient's last visit, pt has had PET  Skull Base to Thigh ([id number]) on 02/12/2021, which revealed "1. Relatively similar disease burden. Some lesions, including a superior segment left lower lobe pulmonary nodule are progressive. The majority of lesions are similar in size with relatively similar hypermetabolism. No new sites of disease. 2. The dominant lesion, within the subpleural right upper lobe is relatively similar in size and hypermetabolism. (Deauville) 4. 3. Similar size of and hypermetabolism within an indeterminate right adrenal lesion. 4. Coronary artery atherosclerosis. Aortic Atherosclerosis (ICD10-I70.0)."  On review of systems, pt reports SOB, ankle pain when lying down, congestion, frequent urination at night and denies chest pain, and any other symptoms.  MEDICAL HISTORY:  Past Medical History:   Diagnosis Date  . Cervical spondylosis without myelopathy 12/01/2013  . Diabetes mellitus    borderline  . Enlarged prostate   . Frequency of urination   . Hearing deficit   . Heart murmur   . History of kidney stones    removed with Lithotripsy  . HOH (hard of hearing)    right ear  . Hyperlipemia   . Hypertension   . Lumbago 12/01/2013  . Pneumonia 2016  . Polio    right leg smaller than left  . Shortness of breath dyspnea    with exertion    SURGICAL HISTORY: Past Surgical History:  Procedure Laterality Date  . CATARACT EXTRACTION Bilateral 2014  . LUNG BIOPSY Right 2018   March  . VIDEO ASSISTED THORACOSCOPY (VATS)/WEDGE RESECTION Right 04/12/2018   Procedure: VIDEO ASSISTED THORACOSCOPY (VATS), WEDGE RESECTION RIGHT MIDDLE LOBE, PLACEMENT OF ON Q CATHETER;  Surgeon: [name], MD;  Location: [facility];  Service: Thoracic;  Laterality: Right;  [name] VIDEO BRONCHOSCOPY N/A 04/12/2018   Procedure: VIDEO BRONCHOSCOPY;  Surgeon: [name], MD;  Location: [facility];  Service: Thoracic;  Laterality: N/A;  . VIDEO BRONCHOSCOPY WITH ENDOBRONCHIAL NAVIGATION N/A 01/25/2016   Procedure: VIDEO BRONCHOSCOPY WITH ENDOBRONCHIAL NAVIGATION;  Surgeon: [name], MD;  Location: [facility];  Service: Thoracic;  Laterality: N/A;  . VIDEO BRONCHOSCOPY WITH ENDOBRONCHIAL ULTRASOUND N/A 01/25/2016   Procedure: VIDEO BRONCHOSCOPY WITH ENDOBRONCHIAL ULTRASOUND;  Surgeon: [name], MD;  Location: [facility];  Service: Thoracic;  Laterality: N/A;    SOCIAL HISTORY: Social History   Socioeconomic History  . Marital status: Married    Spouse name: Not on file  . Number of children: 2  . Years of education: Not on file  . Highest education level: Not on file  Occupational History  . Not on file  Tobacco Use  . Smoking status: Former Smoker    Packs/day: 0.50    Years: 9.00    Pack years: 4.50    Types: Cigarettes    Quit date: 12/02/1967    Years since quitting: 53.2  .  Smokeless tobacco: Former Systems developer    Types: Chew    Quit date: 2000  Media planner  . Vaping Use: Never used  Substance and Sexual Activity  . Alcohol use: No  . Drug use: No  . Sexual activity: Never  Other Topics Concern  . Not on file  Social History Narrative   Patient is right handed.   Patient drinks 1 cup caffeine daily.      Pt has been married for 55 years and has 2 children, 4 grandchildren, and 3 great-grandchildren. Lives with everyone except great-grandchildren. He does not clean house, but can shop, do yard work, and drive. 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  Epworth Sleepiness Scale Score:  8      --I have HTN   --I seem to be losing my sex drive   --I wake up to urinate frequently at night   --I awake feeling not rested   --I have borderline diabetes   Social Determinants of Health   Financial Resource Strain: Not on file  Food Insecurity: Not on file  Transportation Needs: Not on file  Physical Activity: Not on file  Stress: Not on file  Social Connections: Not on file  Intimate Partner Violence: Not on file    FAMILY HISTORY: Family History  Problem Relation Age of Onset  . Cancer Mother        Stomach  . Cancer - Lung Father   . Heart attack Brother   . Diabetes Brother     ALLERGIES:  is allergic to levaquin [levofloxacin in d5w] and penicillins.  MEDICATIONS:  Current Outpatient Medications  Medication Sig Dispense Refill  . alfuzosin (UROXATRAL) 10 MG 24 hr tablet Take 10 mg by mouth at bedtime.     [name] amLODipine (NORVASC) 10 MG tablet Take 10 mg by mouth daily.    [name] amLODipine (NORVASC) 5 MG tablet     . Ascorbic Acid (SUPER C COMPLEX PO) Take 1 capsule by mouth every evening. Super C 1000    . atorvastatin (LIPITOR) 20 MG tablet Take 1 tablet (20 mg total) by mouth daily. 90 tablet 3  . Calcium Carb-Cholecalciferol (CALCIUM 600+D3 PO) Take 1 tablet by mouth every evening.    . Cyanocobalamin (VITAMIN B-12)  5000 MCG SUBL Place 1 tablet under the tongue every evening.     . Ferrous Sulfate (IRON) 28 MG TABS Take 28 mg by mouth every evening.    . finasteride (PROSCAR) 5 MG tablet Take 5 mg by mouth at bedtime.     . hydrochlorothiazide (HYDRODIURIL) 12.5 MG tablet Take 12.5 mg by mouth daily.    . hydrocortisone (ANUSOL-HC) 25 MG suppository Place 1 suppository (25 mg total) rectally 2 (two) times daily. 12 suppository 0  . Omega-3 Fatty Acids (FISH OIL) 1200 MG CAPS Take 1,200 mg by mouth every evening.    . ondansetron (ZOFRAN) 8 MG tablet Take 1 tablet (8 mg total) by mouth every 8 (eight) hours as needed for nausea. 20 tablet 0  . Polyethyl Glycol-Propyl Glycol 0.4-0.3 % SOLN Place 1 drop into both eyes 2 (two) times daily as needed (for dry/irritated eyes.).    [name] traMADol (ULTRAM) 50 MG tablet Take 1 tablet (50 mg total) by mouth every 6 (six) hours as needed (pain). (Patient taking differently: Take 25 mg by mouth 2 (two) times daily.) 28 tablet 0  . triamcinolone ointment (KENALOG) 0.5 % APPLY TO ITCHY RASH ON EXTREMITIES AND WAIST TWICE A DAY FOR 15 DAYS. DO NOT APPLY TO FACE 30 g 0  . Vitamin Mixture (SELENIUM-VITAMIN E) 50-400 MCG-UNIT CAPS Take 1 capsule by mouth every evening.    Alveda Reasons 20 MG TABS tablet TAKE 1 TABLET (20 MG TOTAL) BY MOUTH DAILY WITH SUPPER. 90 tablet 1   No current facility-administered medications for this visit.    REVIEW OF SYSTEMS:   10 Point review of Systems was done is negative except as noted above.  PHYSICAL EXAMINATION: ECOG PERFORMANCE STATUS: 1 - Symptomatic but completely ambulatory   Exam was given in a chair. .There were no vitals taken for this visit.  Telehealth Visit.   LABORATORY DATA:  I have reviewed the data  as listed  . CBC Latest Ref Rng & Units 01/22/2021 07/25/2020 05/02/2020  WBC 4.0 - 10.5 K/uL 2.5(L) 3.4(L) 2.8(L)  Hemoglobin 13.0 - 17.0 g/dL 13.3 13.9 12.1(L)  Hematocrit 39.0 - 52.0 % 38.5(L) 40.4 35.2(L)  Platelets 150 -  400 K/uL 115(L) 131(L) 169    . CMP Latest Ref Rng & Units 01/22/2021 07/25/2020 05/02/2020  Glucose 70 - 99 mg/dL 177(H) 188(H) 169(H)  BUN 8 - 23 mg/dL 22 17 15   Creatinine 0.61 - 1.24 mg/dL 1.37(H) 1.10 1.10  Sodium 135 - 145 mmol/L 141 137 139  Potassium 3.5 - 5.1 mmol/L 4.0 3.9 4.0  Chloride 98 - 111 mmol/L 102 103 104  CO2 22 - 32 mmol/L 30 30 28   Calcium 8.9 - 10.3 mg/dL 9.0 9.1 8.8(L)  Total Protein 6.5 - 8.1 g/dL 6.9 6.5 6.3(L)  Total Bilirubin 0.3 - 1.2 mg/dL 1.1 0.8 0.6  Alkaline Phos 38 - 126 U/L 95 79 108  AST 15 - 41 U/L 23 23 34  ALT 0 - 44 U/L 35 27 66(H)   . Lab Results  Component Value Date   LDH 175 01/22/2021    04/12/18 Flow Cytometry:   04/12/18 Lung biopsy:     RADIOGRAPHIC STUDIES: I have personally reviewed the radiological images as listed and agreed with the findings in the report. NM PET Image Restag (PS) Skull Base To Thigh  Result Date: 02/14/2021 CLINICAL DATA:  Subsequent treatment strategy for marginal zone lymphoma diagnosed in 2019. Lung cancer diagnosed in 2018 with wedge resection. EXAM: NUCLEAR MEDICINE PET SKULL BASE TO THIGH TECHNIQUE: 9.6 mCi F-18 FDG was injected intravenously. Full-ring PET imaging was performed from the skull base to thigh after the radiotracer. CT data was obtained and used for attenuation correction and anatomic localization. Fasting blood glucose: 96 mg/dl COMPARISON:  Abdominal MRI and PET of 03/12/2020. FINDINGS: Mediastinal blood pool activity: SUV max 2.7 Liver activity: SUV max 3.2 NECK: No areas of abnormal hypermetabolism. Incidental CT findings: No cervical adenopathy. Bilateral carotid atherosclerosis. Probable sebaceous cyst about the left posterior paramidline region. CHEST: Superior segment left lower lobe pulmonary nodule measures 1.6 cm and a S.U.V. max of 5.0 on 20/8. Compare 1.2 cm and a S.U.V. max of 3.2 on the prior exam. Vague right upper lobe pulmonary nodule measures 9 mm and a S.U.V. max of 2.2 on  19/8. Compare 7 mm and a S.U.V. max of 2.1 on the prior exam. Vague right upper lobe, perihilar node measures 1.5 cm and a S.U.V. max of 3.3 on 31/8. Compare 1.5 cm and a S.U.V. max of 2.6 on the prior exam. Pleural based right upper lobe soft tissue density measures 4.1 x 1.4 cm and a S.U.V. max of 7.1 on 70/4. Compare 4.5 x 1.2 cm and a S.U.V. max of 8.0 on the prior exam. Again identified are calcified hypermetabolic mediastinal and hilar nodes. Example subcarinal node of 1.1 cm and a S.U.V. max of 4.2. Compare similar in size and a S.U.V. max of 4.6 on the prior exam. The soft tissue within the posterior mediastinum/paravertebral space measures 1.1 cm and a S.U.V. max of 5.2 on 76/4. Compare 1.0 cm and a S.U.V. max of 6.0 on the prior exam. Incidental CT findings: Aortic and coronary artery atherosclerosis. Mild cardiomegaly. Right upper lobe wedge resection. ABDOMEN/PELVIS: Right adrenal nodule measures 2.4 cm and a S.U.V. max of 3.8 versus 2.6 cm and a S.U.V. max of 5.4 on the prior exam. No abdominopelvic nodal hypermetabolism. Incidental CT  findings: Bilateral renal sinus cysts. Abdominal aortic atherosclerosis. Hepatic cysts. Mild prostatomegaly. SKELETON: No abnormal marrow activity. Incidental CT findings: Degenerative partial fusion of bilateral sacroiliac joints. IMPRESSION: 1. Relatively similar disease burden. Some lesions, including a superior segment left lower lobe pulmonary nodule are progressive. The majority of lesions are similar in size with relatively similar hypermetabolism. No new sites of disease. 2. The dominant lesion, within the subpleural right upper lobe is relatively similar in size and hypermetabolism. (Deauville) 4. 3. Similar size of and hypermetabolism within an indeterminate right adrenal lesion. 4. Coronary artery atherosclerosis. Aortic Atherosclerosis (ICD10-I70.0). Electronically Signed   By: Abigail Miyamoto M.D.   On: 02/14/2021 09:47    ASSESSMENT & PLAN:   80 y.o. male  with  1. 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  If this is an isolated lesion this would make in Stage IE.  04/12/18 bx/resection of the right middle lobe revealed an extranodal marginal zone lymphoma   07/21/18 PET/CT revealed Interval resection of the RIGHT middle lobe pulmonary nodule. 2. Increase thickening of subpleural tissue in the RIGHT upper lobe with increased metabolic activity. In patient with lymphoma history, cannot exclude atypical thoracic lymphoma. 3. No change in small hypermetabolic mediastinal lymph nodes which are favored reactive. 4. Prostatomegaly with nodularity. 5. Stable RIGHT adrenal mass. Favor benign.  03/11/19 CT Chest revealed "Ill-defined ground-glass and consolidative nodules in both lungs remain stable, except for mild increase in size of 1.6 cm central right perihilar nodule. Consider continued followup by chest CT in 6 months. 2. Stable mild partially-calcified mediastinal and bilateral hilar lymphadenopathy. 3. Stable 2.6 cm right adrenal mass,, likely a benign adenoma. Recommend continued attention on follow-up CT. 4. Aortic and coronary artery atherosclerosis."   09/18/2019 CT Angio Chest/Abd/Pel ([id number]) revealed "1. No acute vascular findings. 2. Trace new right pleural effusion, cause uncertain. 3. Similar sub solid nodules and sub solid right subpleural airspace opacity. In this patient with history of lymphoma, the possibility of atypical pulmonary involvement is not readily excluded. Surveillance imaging of the lung lesions is recommended. 4. Nonspecific 1.8 cm focus of arterial phase enhancement in segment 2 of the liver. There is no prior accentuated metabolic activity in this vicinity on prior PET-CT of 07/21/2018. Possibilities for further workup of this lesion include hepatic protocol MRI with and without contrast, or surveillance on follow up imaging related to the patient's lymphoma. 5. Chronically stable right adrenal mass, likely  benign. 6. Other imaging findings of potential clinical significance: Aortic Atherosclerosis (ICD10-I70.0). Coronary atherosclerosis. Mild cardiomegaly. Calcified mediastinal adenopathy, stable. Postoperative findings in the right upper lobe. Stable small well-defined foci of sclerosis in the right fourth rib. Chronic superior endplate compression at L1. Right foraminal stenosis at L4-5 due to spurring. Lipoma deep to the left gluteus maximus muscle, stable, this mildly abuts the left sciatic nerve. Prostatomegaly."  03/12/2020 PET/CT ([id number]) revealed "1. Interval increase in size and FDG uptake associated with subpleural soft tissue within the anterior right upper lobe compatible with Deauville criteria 4/5 disease. 2. Paravertebral soft tissue within the posterior mediastinum medial to the descending aorta exhibits increased FDG uptake. Deauville criteria 4. 3. Mild increase in size and degree of FDG uptake associated with bilateral pulmonary nodules. 4. Similar mild to moderate increased FDG uptake associated with calcified mediastinal and hilar lymph nodes. This is a nonspecific finding and may reflect changes of granulomatous inflammation or infection. 5. Similar appearance of indeterminate right adrenal nodule which exhibits mild to moderate FDG uptake with SUV  max of 5.4 6. No significant FDG uptake within the liver."  03/12/2020 MRI Liver ([id number]) revealed benign hemangioma, no hypermetabolic lesion  2) Acute Bronchitis  PLAN:  -Discussed pt PET Skull Base to Thigh ([id number]) on 02/12/2021; nothing dramatically new. Most have shrunk in size. One lesion borderline larger in left lower lung that will continued to be monitored. -Advised pt that his Lymphopenia is from treatment and unconcerning at this time. All other blood counts were stable.  -Recommended pt contact PCP regarding ankle pain and potential gout or arthritis. -Advised pt we will get a CT scan of the lungs in 4-6 months to  observe the spot that is borderline in the left lower lung. -Recommended pt continue to eat healthy and stay physically active. -No lab or clinical evidence of MALT lymphoma recurrence/progression at this time. Will continue watchful observation.  -Will see back in 4 months with labs. CT Chest 1-2 weeks prior.    2.  Patient Active Problem List   Diagnosis Date Noted  . History of hearing loss 06/21/2020  . Impacted cerumen of left ear 06/21/2020  . Marginal zone lymphoma ([place]) 03/25/2020  . Counseling regarding advance care planning and goals of care 03/25/2020  . Renal insufficiency 04/19/2018  . Chronic anticoagulation 04/19/2018  . Lung mass 04/12/2018  . Permanent atrial fibrillation ([place]) 01/19/2018  . Pneumothorax 01/25/2016  . Pneumothorax after biopsy 01/25/2016  . Coronary artery calcification seen on CT scan 01/21/2016  . Murmur, cardiac 01/21/2016  . Hypercholesterolemia 01/21/2016  . Essential hypertension 01/09/2016  . BPH (benign prostatic hyperplasia) 01/09/2016  . Solitary pulmonary nodule 01/09/2016  . Thrombocytopenia ([place]) 01/09/2016  . Cervical spondylosis without myelopathy 12/01/2013  . Lumbago 12/01/2013  . Diabetes mellitus     FOLLOW UP: Ct chest wo contrast in 14 weeks with labs MD visit in 16 weeks    The total time spent in the appointment was 15 minutes and more than 50% was on counseling and direct patient cares.   All of the patient's questions were answered with apparent satisfaction. The patient knows to call the clinic with any problems, questions or concerns.    Sullivan Lone MD [place] AAHIVMS [facility] Hematology/Oncology Physician [facility]  (Office):       [phone] (Work cell):  [phone] (Fax):           [phone]  02/14/2021 4:22 PM  I, Reinaldo Raddle, am acting as scribe for Dr. Sullivan Lone, MD.    .I have reviewed the above documentation for accuracy and completeness, and I agree with the above. Brunetta Genera MD

## 2021-02-13 ENCOUNTER — Inpatient Hospital Stay: Payer: PPO | Attending: Hematology | Admitting: Hematology

## 2021-02-13 DIAGNOSIS — C8582 Other specified types of non-Hodgkin lymphoma, intrathoracic lymph nodes: Secondary | ICD-10-CM

## 2021-03-12 ENCOUNTER — Other Ambulatory Visit: Payer: Self-pay

## 2021-03-12 ENCOUNTER — Ambulatory Visit: Payer: Medicare HMO | Admitting: Podiatry

## 2021-04-03 DIAGNOSIS — R3914 Feeling of incomplete bladder emptying: Secondary | ICD-10-CM | POA: Diagnosis not present

## 2021-04-03 DIAGNOSIS — R351 Nocturia: Secondary | ICD-10-CM | POA: Diagnosis not present

## 2021-04-03 DIAGNOSIS — N401 Enlarged prostate with lower urinary tract symptoms: Secondary | ICD-10-CM | POA: Diagnosis not present

## 2021-04-04 DIAGNOSIS — I10 Essential (primary) hypertension: Secondary | ICD-10-CM | POA: Diagnosis not present

## 2021-04-04 DIAGNOSIS — I700 Atherosclerosis of aorta: Secondary | ICD-10-CM | POA: Diagnosis not present

## 2021-04-04 DIAGNOSIS — E1169 Type 2 diabetes mellitus with other specified complication: Secondary | ICD-10-CM | POA: Diagnosis not present

## 2021-04-04 DIAGNOSIS — J309 Allergic rhinitis, unspecified: Secondary | ICD-10-CM | POA: Diagnosis not present

## 2021-04-04 DIAGNOSIS — N400 Enlarged prostate without lower urinary tract symptoms: Secondary | ICD-10-CM | POA: Diagnosis not present

## 2021-04-04 DIAGNOSIS — D6869 Other thrombophilia: Secondary | ICD-10-CM | POA: Diagnosis not present

## 2021-04-04 DIAGNOSIS — C8582 Other specified types of non-Hodgkin lymphoma, intrathoracic lymph nodes: Secondary | ICD-10-CM | POA: Diagnosis not present

## 2021-04-04 DIAGNOSIS — I4891 Unspecified atrial fibrillation: Secondary | ICD-10-CM | POA: Diagnosis not present

## 2021-04-04 DIAGNOSIS — E782 Mixed hyperlipidemia: Secondary | ICD-10-CM | POA: Diagnosis not present

## 2021-04-04 DIAGNOSIS — M545 Low back pain, unspecified: Secondary | ICD-10-CM | POA: Diagnosis not present

## 2021-04-08 ENCOUNTER — Other Ambulatory Visit: Payer: Self-pay

## 2021-04-08 ENCOUNTER — Encounter: Payer: Self-pay | Admitting: Podiatry

## 2021-04-08 ENCOUNTER — Ambulatory Visit (INDEPENDENT_AMBULATORY_CARE_PROVIDER_SITE_OTHER): Payer: PPO | Admitting: Podiatry

## 2021-04-08 DIAGNOSIS — L84 Corns and callosities: Secondary | ICD-10-CM

## 2021-04-08 DIAGNOSIS — E1142 Type 2 diabetes mellitus with diabetic polyneuropathy: Secondary | ICD-10-CM

## 2021-04-08 DIAGNOSIS — E1151 Type 2 diabetes mellitus with diabetic peripheral angiopathy without gangrene: Secondary | ICD-10-CM | POA: Diagnosis not present

## 2021-04-08 DIAGNOSIS — B351 Tinea unguium: Secondary | ICD-10-CM | POA: Diagnosis not present

## 2021-04-08 DIAGNOSIS — E1169 Type 2 diabetes mellitus with other specified complication: Secondary | ICD-10-CM

## 2021-04-08 NOTE — Progress Notes (Signed)
  Subjective:  Patient ID: James Sweeney, male    DOB: 03-21-1941,  MRN: [id number]  Chief Complaint  Patient presents with  . Nail Problem    Thick painful toenails, 3 month follow up    80 y.o. male returns with the above complaint. History confirmed with patient.   Objective:  Physical Exam: warm, good capillary refill, no trophic changes or ulcerative lesions, normal DP and PT pulses, normal sensory exam and onychomycosis x10 with subungual debris, yellow discoloration thickening the nail plate and dystrophy.  Multiple porokeratosis plantar forefoot bilaterally.  Assessment:   1. Onychomycosis of multiple toenails with type 2 diabetes mellitus and peripheral angiopathy ([place])   2. Type 2 diabetes mellitus with diabetic polyneuropathy, without long-term current use of insulin ([facility])   3. Callus of foot      Plan:  Patient was evaluated and treated and all questions answered.  Patient educated on diabetes. Discussed proper diabetic foot care and discussed risks and complications of disease. Educated patient in depth on reasons to return to the office immediately should he/she discover anything concerning or new on the feet. All questions answered. Discussed proper shoes as well.   Discussed the etiology and treatment options for the condition in detail with the patient. Educated patient on the topical and oral treatment options for mycotic nails. Recommended debridement of the nails today. Sharp and mechanical debridement performed of all painful and mycotic nails today. Nails debrided in length and thickness using a nail nipper to level of comfort. Discussed treatment options including appropriate shoe gear. Follow up as needed for painful nails.   Return in about 3 months (around 07/09/2021) for at risk diabetic foot care.

## 2021-06-06 ENCOUNTER — Other Ambulatory Visit: Payer: Self-pay | Admitting: Hematology

## 2021-06-20 ENCOUNTER — Telehealth: Payer: Self-pay

## 2021-06-20 DIAGNOSIS — I4821 Permanent atrial fibrillation: Secondary | ICD-10-CM

## 2021-06-20 NOTE — Telephone Encounter (Signed)
Pt's spouse called stating that they need samples as they are out of xarelto I have also asked if they have been sent the pt assistance forms and they stated that they haven't. I will route to the pharmd pool for permission to give samples and give pt assistance forms with them as well.

## 2021-06-20 NOTE — Telephone Encounter (Signed)
Dr Sallyanne Kuster, You saw patient on 11/16/20 and in your plan you mentioned lower dose Xarelto due to easy bruising and age.  Looks like he has still been taking Xarelto '20mg'$ .  Patient requesting samples and patient assistance.  Still borderline qualifies for '20mg'$  (CrCl 39m/min). Would you prefer him be reduced to '15mg'$  or continue on '20mg'$ ?

## 2021-06-20 NOTE — Telephone Encounter (Signed)
Ok to dispense Xarelto '15mg'$  sample once daily and begin patient assistance paperwork

## 2021-06-21 ENCOUNTER — Telehealth: Payer: Self-pay | Admitting: Cardiovascular Disease

## 2021-06-21 MED ORDER — RIVAROXABAN 15 MG PO TABS
15.0000 mg | ORAL_TABLET | Freq: Every day | ORAL | 0 refills | Status: DC
Start: 2021-06-21 — End: 2021-07-19

## 2021-06-21 NOTE — Telephone Encounter (Signed)
Patient calling the office for samples of medication:   1.  What medication and dosage are you requesting samples for? Xarelto  2.  Are you currently out of this medication? Yes- need some until he can get his in 2 weeks

## 2021-06-21 NOTE — Telephone Encounter (Signed)
Called and lmom pt's grandson. Stated that we are placing xarelto '15mg'$  samples out front along w/pt assistance forms for the pt to complete and that we also decreased dosage per dr. Victorino December request

## 2021-06-21 NOTE — Telephone Encounter (Signed)
Returned call to patient Xarelto 15 mg samples Lot # [id number] 4/24 left a front desk.

## 2021-07-19 ENCOUNTER — Telehealth: Payer: Self-pay

## 2021-07-19 MED ORDER — RIVAROXABAN 15 MG PO TABS: 15.0000 mg | ORAL_TABLET | Freq: Every day | ORAL | 1 refills | Status: DC

## 2021-07-19 NOTE — Telephone Encounter (Signed)
Prescription refill request for Xarelto received.  Indication:atrial fib Last office visit:1/22 Weight:86 kg Age:80 Scr:1.3 CrCl:55.13 ml/min  Prescription refilled

## 2021-07-23 ENCOUNTER — Other Ambulatory Visit: Payer: Self-pay | Admitting: *Deleted

## 2021-07-23 NOTE — Telephone Encounter (Signed)
Refill needed for Xarelto.

## 2021-07-23 NOTE — Addendum Note (Signed)
Addended by: Allean Found on: 07/23/2021 01:06 PM   Modules accepted: Orders

## 2021-07-23 NOTE — Telephone Encounter (Signed)
Dr C would like patient to be on Xarelto 15mg .  Has already been refilled.

## 2021-07-23 NOTE — Telephone Encounter (Signed)
Pt qualifies for dose increase. Routing to pharmd pool.  Prescription refill request for Xarelto received.  Indication:afib Last office visit:croitoru 11/16/20 Weight:86kg Age:81m Scr:1.37 01/22/21 CrCl:52.3kg

## 2021-07-25 ENCOUNTER — Inpatient Hospital Stay: Payer: PPO

## 2021-07-25 ENCOUNTER — Telehealth: Payer: Self-pay | Admitting: Hematology

## 2021-07-25 ENCOUNTER — Inpatient Hospital Stay: Payer: PPO | Admitting: Hematology

## 2021-07-25 NOTE — Telephone Encounter (Signed)
Scheduled per sch msg.called and was not able to leave a msg.

## 2021-07-30 ENCOUNTER — Telehealth: Payer: Self-pay | Admitting: Cardiovascular Disease

## 2021-07-30 MED ORDER — RIVAROXABAN 15 MG PO TABS
15.0000 mg | ORAL_TABLET | Freq: Every day | ORAL | 1 refills | Status: DC
Start: 2021-07-30 — End: 2021-11-22

## 2021-07-30 NOTE — Telephone Encounter (Signed)
*  STAT* If patient is at the pharmacy, call can be transferred to refill team.   1. Which medications need to be refilled? (please list name of each medication and dose if known)  Rivaroxaban (XARELTO) 15 MG TABS tablet  2. Which pharmacy/location (including street and city if local pharmacy) is medication to be sent to? [id number] [place], [place] - [address]  3. Do they need a 30 day or 90 day supply? 30 with refills  Patient is scheduled to see Dr. Sallyanne Kuster 11/22/21. The wife states sh e has only been getting 4 pills at a time, and it is too much to keep going back and forth to the pharmacy

## 2021-07-30 NOTE — Telephone Encounter (Signed)
Prescription refill request for Xarelto received.  Indication:Afib  Last office visit: 11/16/20 Weight: 86kg Age: 80 Scr: 1.37 (01/22/21) CrCl: 38ml/min  Appropriate dose and refill sent to requested pharmacy.

## 2021-08-07 ENCOUNTER — Ambulatory Visit (HOSPITAL_COMMUNITY): Payer: PPO

## 2021-08-15 ENCOUNTER — Other Ambulatory Visit: Payer: Self-pay | Admitting: Cardiovascular Disease

## 2021-08-16 ENCOUNTER — Inpatient Hospital Stay: Payer: PPO | Admitting: Hematology

## 2021-08-16 ENCOUNTER — Inpatient Hospital Stay: Payer: PPO

## 2021-08-16 NOTE — Telephone Encounter (Signed)
Prescription refill request for Xarelto received.  Indication:Afib Last office visit:1/22 Weight:86 kg Age:80 Scr:1.3 CrCl:55.13 ml/min  Prescription refilled

## 2021-08-23 DIAGNOSIS — H6122 Impacted cerumen, left ear: Secondary | ICD-10-CM | POA: Diagnosis not present

## 2021-08-31 ENCOUNTER — Other Ambulatory Visit: Payer: Self-pay | Admitting: Cardiovascular Disease

## 2021-09-02 ENCOUNTER — Inpatient Hospital Stay (HOSPITAL_BASED_OUTPATIENT_CLINIC_OR_DEPARTMENT_OTHER): Payer: PPO | Admitting: Hematology

## 2021-09-02 ENCOUNTER — Inpatient Hospital Stay: Payer: PPO | Attending: Hematology

## 2021-09-02 ENCOUNTER — Other Ambulatory Visit: Payer: Self-pay

## 2021-09-02 VITALS — BP 153/64 | HR 80 | Temp 98.1°F | Resp 18 | Wt 196.7 lb

## 2021-09-02 DIAGNOSIS — D61818 Other pancytopenia: Secondary | ICD-10-CM | POA: Insufficient documentation

## 2021-09-02 DIAGNOSIS — Z833 Family history of diabetes mellitus: Secondary | ICD-10-CM | POA: Insufficient documentation

## 2021-09-02 DIAGNOSIS — R0609 Other forms of dyspnea: Secondary | ICD-10-CM | POA: Diagnosis not present

## 2021-09-02 DIAGNOSIS — Z800 Family history of malignant neoplasm of digestive organs: Secondary | ICD-10-CM | POA: Diagnosis not present

## 2021-09-02 DIAGNOSIS — C884 Extranodal marginal zone B-cell lymphoma of mucosa-associated lymphoid tissue [MALT-lymphoma]: Secondary | ICD-10-CM | POA: Diagnosis not present

## 2021-09-02 DIAGNOSIS — Z8249 Family history of ischemic heart disease and other diseases of the circulatory system: Secondary | ICD-10-CM | POA: Diagnosis not present

## 2021-09-02 DIAGNOSIS — Z801 Family history of malignant neoplasm of trachea, bronchus and lung: Secondary | ICD-10-CM | POA: Diagnosis not present

## 2021-09-02 DIAGNOSIS — I10 Essential (primary) hypertension: Secondary | ICD-10-CM | POA: Diagnosis not present

## 2021-09-02 DIAGNOSIS — Z87891 Personal history of nicotine dependence: Secondary | ICD-10-CM | POA: Diagnosis not present

## 2021-09-02 DIAGNOSIS — I4891 Unspecified atrial fibrillation: Secondary | ICD-10-CM | POA: Insufficient documentation

## 2021-09-02 DIAGNOSIS — H9190 Unspecified hearing loss, unspecified ear: Secondary | ICD-10-CM | POA: Insufficient documentation

## 2021-09-02 DIAGNOSIS — K5900 Constipation, unspecified: Secondary | ICD-10-CM | POA: Diagnosis not present

## 2021-09-02 DIAGNOSIS — Z79899 Other long term (current) drug therapy: Secondary | ICD-10-CM | POA: Diagnosis not present

## 2021-09-02 DIAGNOSIS — D539 Nutritional anemia, unspecified: Secondary | ICD-10-CM | POA: Diagnosis not present

## 2021-09-02 DIAGNOSIS — C8580 Other specified types of non-Hodgkin lymphoma, unspecified site: Secondary | ICD-10-CM

## 2021-09-02 DIAGNOSIS — C8582 Other specified types of non-Hodgkin lymphoma, intrathoracic lymph nodes: Secondary | ICD-10-CM

## 2021-09-02 LAB — CMP (CANCER CENTER ONLY)
ALT: 30 U/L (ref 0–44)
AST: 22 U/L (ref 15–41)
Albumin: 3.9 g/dL (ref 3.5–5.0)
Alkaline Phosphatase: 118 U/L (ref 38–126)
Anion gap: 5 (ref 5–15)
BUN: 15 mg/dL (ref 8–23)
CO2: 28 mmol/L (ref 22–32)
Calcium: 8.9 mg/dL (ref 8.9–10.3)
Chloride: 105 mmol/L (ref 98–111)
Creatinine: 1.12 mg/dL (ref 0.61–1.24)
GFR, Estimated: >60 mL/min (ref >60)
Glucose, Bld: 102 mg/dL — ABNORMAL HIGH (ref 70–99)
Potassium: 4.2 mmol/L (ref 3.5–5.1)
Sodium: 138 mmol/L (ref 135–145)
Total Bilirubin: 1 mg/dL (ref 0.3–1.2)
Total Protein: 6.6 g/dL (ref 6.5–8.1)

## 2021-09-02 LAB — CBC WITH DIFFERENTIAL/PLATELET
Abs Immature Granulocytes: 0 10*3/uL (ref 0.00–0.07)
Basophils Absolute: 0 10*3/uL (ref 0.0–0.1)
Basophils Relative: 1 %
Eosinophils Absolute: 0.1 10*3/uL (ref 0.0–0.5)
Eosinophils Relative: 4 %
HCT: 28.7 % — ABNORMAL LOW (ref 39.0–52.0)
Hemoglobin: 9.8 g/dL — ABNORMAL LOW (ref 13.0–17.0)
Immature Granulocytes: 0 %
Lymphocytes Relative: 16 %
Lymphs Abs: 0.3 10*3/uL — ABNORMAL LOW (ref 0.7–4.0)
MCH: 38 pg — ABNORMAL HIGH (ref 26.0–34.0)
MCHC: 34.1 g/dL (ref 30.0–36.0)
MCV: 111.2 fL — ABNORMAL HIGH (ref 80.0–100.0)
Monocytes Absolute: 0.1 10*3/uL (ref 0.1–1.0)
Monocytes Relative: 4 %
Neutro Abs: 1.5 10*3/uL — ABNORMAL LOW (ref 1.7–7.7)
Neutrophils Relative %: 75 %
Platelets: 131 10*3/uL — ABNORMAL LOW (ref 150–400)
RBC: 2.58 MIL/uL — ABNORMAL LOW (ref 4.22–5.81)
RDW: 13.9 % (ref 11.5–15.5)
WBC: 2 10*3/uL — ABNORMAL LOW (ref 4.0–10.5)
nRBC: 0 % (ref 0.0–0.2)

## 2021-09-02 LAB — LACTATE DEHYDROGENASE: LDH: 186 U/L (ref 98–192)

## 2021-09-08 ENCOUNTER — Encounter: Payer: Self-pay | Admitting: Hematology

## 2021-09-09 NOTE — Addendum Note (Signed)
Addended by: Sullivan Lone on: 09/09/2021 09:28 AM   Modules accepted: Orders, Level of Service

## 2021-09-09 NOTE — Progress Notes (Signed)
HEMATOLOGY/ONCOLOGY CLINIC NOTE  Date of Service: .09/02/2021   Patient Care Team: Lois Huxley, PA as PCP - General (Family Medicine)  CHIEF COMPLAINTS/PURPOSE OF CONSULTATION:  Extranodal Marginal Zone MALT Lymphoma  HISTORY OF PRESENTING ILLNESS:   James Sweeney is a wonderful 80 y.o. male who has been referred to [name] by Dr Antony Contras for evaluation and management of Extranodal Marginal Zone MALT Lymphoma. He is accompanied today by his wife. The pt reports that he is doing well overall.   The pt notes that about 4-5 years ago his urologist first discovered a small mass in his lung. He had a CT  in June 2016 which showed this mass. A navigation bronchoscopy EBUS was performed in March 2017 that did not reveal a definitive pathology. A repeat bx (VATS  With wedge resection of  RML) biopsy was completed on 04/12/18 which revealed an Extranodal marginal zone lymphoma. He denies any constitutional symptoms. The pt reports that he still has some soreness at his surgical site but denies any difficulty breathing.   He is taking Xarelto for Afib.   He last had pneumonia last summer 2018, and has had pneumonia twice. He maintains regular follow up with his PCP Dr Antony Contras.   Most recent lab results (04/12/18) of CBC  is as follows: all values are WNL except for RBC at 3.44, HGB at 11.8, HCT at 35.3, MCV at 102.6, MCH at 34.3, PLT at 126k.   On review of systems, pt reports good energy levels, chest soreness at surgical site, improving breathing, constipation, and denies fevers, chills, night sweats, unexpected weight loss, abdominal pains, leg swelling, and any other symptoms.   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 He notes having breathed in methanol/ethanol exhaust fumes in his work at times.   INTERVAL HISTORY:   James Sweeney returns today for management and evaluation of his Extranodal Marginal Zone MALT lymphoma. We are joined today by his  wife. The patient's last visit with [name] was on 02/13/2021.   Patient is accompanied by his wife.  He is very hard of hearing and very difficult to communicate with.  He has been recommended evaluation for hearing aids but has not done this after multiple recommendations.  He has also not followed up for his CT of the chest as per schedule appointment and this is still pending.  Patient reports he has been having some dyspnea on exertion.  His wife suggests this could be just related to his age.  Labs done today 09/02/2021 show CBC with a hemoglobin of 9.8, MCV of 111.2, WBC count of 2k platelets of 131k. CMP unremarkable LDH 186 Patient reports no fevers no chills no night sweats no new cough no new wheezing. No reported weight loss.  In fact he has gained 7 pounds since his last visit.   MEDICAL HISTORY:  Past Medical History:  Diagnosis Date   Cervical spondylosis without myelopathy 12/01/2013   Diabetes mellitus    borderline   Enlarged prostate    Frequency of urination    Hearing deficit    Heart murmur    History of kidney stones    removed with Lithotripsy   HOH (hard of hearing)    right ear   Hyperlipemia    Hypertension    Lumbago 12/01/2013   Pneumonia 2016   [place]    right leg smaller than left   Shortness of breath dyspnea  with exertion    SURGICAL HISTORY: Past Surgical History:  Procedure Laterality Date   CATARACT EXTRACTION Bilateral 2014   LUNG BIOPSY Right 2018   March   VIDEO ASSISTED THORACOSCOPY (VATS)/WEDGE RESECTION Right 04/12/2018   Procedure: VIDEO ASSISTED THORACOSCOPY (VATS), WEDGE RESECTION RIGHT MIDDLE LOBE, PLACEMENT OF ON Q CATHETER;  Surgeon: [name], MD;  Location: [facility];  Service: Thoracic;  Laterality: Right;   VIDEO BRONCHOSCOPY N/A 04/12/2018   Procedure: VIDEO BRONCHOSCOPY;  Surgeon: [name], MD;  Location: [facility];  Service: Thoracic;  Laterality: N/A;   VIDEO BRONCHOSCOPY WITH ENDOBRONCHIAL NAVIGATION N/A  01/25/2016   Procedure: VIDEO BRONCHOSCOPY WITH ENDOBRONCHIAL NAVIGATION;  Surgeon: [name], MD;  Location: [facility];  Service: Thoracic;  Laterality: N/A;   VIDEO BRONCHOSCOPY WITH ENDOBRONCHIAL ULTRASOUND N/A 01/25/2016   Procedure: VIDEO BRONCHOSCOPY WITH ENDOBRONCHIAL ULTRASOUND;  Surgeon: [name], MD;  Location: [facility];  Service: Thoracic;  Laterality: N/A;    SOCIAL HISTORY: Social History   Socioeconomic History   Marital status: Married    Spouse name: Not on file   Number of children: 2   Years of education: Not on file   Highest education level: Not on file  Occupational History   Not on file  Tobacco Use   Smoking status: Former    Packs/day: 0.50    Years: 9.00    Pack years: 4.50    Types: Cigarettes    Quit date: 12/02/1967    Years since quitting: 53.8   Smokeless tobacco: Former    Types: Chew    Quit date: 2000  Vaping Use   Vaping Use: Never used  Substance and Sexual Activity   Alcohol use: No   Drug use: No   Sexual activity: Never  Other Topics Concern   Not on file  Social History Narrative   Patient is right handed.   Patient drinks 1 cup caffeine daily.      Pt has been married for 69 years and has 2 children, 4 grandchildren, and 3 great-grandchildren. Lives with everyone except great-grandchildren. He does not clean house, but can shop, do yard work, and drive. 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         Epworth Sleepiness Scale Score:  8      --I have HTN   --I seem to be losing my sex drive   --I wake up to urinate frequently at night   --I awake feeling not rested   --I have borderline diabetes   Social Determinants of Health   Financial Resource Strain: Not on file  Food Insecurity: Not on file  Transportation Needs: Not on file  Physical Activity: Not on file  Stress: Not on file  Social Connections: Not on file  Intimate Partner Violence: Not on file    FAMILY HISTORY: Family History   Problem Relation Age of Onset   Cancer Mother        Stomach   Cancer - Lung Father    Heart attack Brother    Diabetes Brother     ALLERGIES:  is allergic to levaquin [levofloxacin in d5w] and penicillins.  MEDICATIONS:  Current Outpatient Medications  Medication Sig Dispense Refill   alfuzosin (UROXATRAL) 10 MG 24 hr tablet Take 10 mg by mouth at bedtime.      amLODipine (NORVASC) 10 MG tablet Take 10 mg by mouth daily.     amLODipine (NORVASC) 5 MG tablet  Ascorbic Acid (SUPER C COMPLEX PO) Take 1 capsule by mouth every evening. Super C 1000     atorvastatin (LIPITOR) 20 MG tablet TAKE 1 TABLET BY MOUTH EVERY DAY 90 tablet 1   Calcium Carb-Cholecalciferol (CALCIUM 600+D3 PO) Take 1 tablet by mouth every evening.     Cyanocobalamin (VITAMIN B-12) 5000 MCG SUBL Place 1 tablet under the tongue every evening.      Ferrous Sulfate (IRON) 28 MG TABS Take 28 mg by mouth every evening.     finasteride (PROSCAR) 5 MG tablet Take 5 mg by mouth at bedtime.      hydrochlorothiazide (HYDRODIURIL) 12.5 MG tablet Take 12.5 mg by mouth daily.     hydrocortisone (ANUSOL-HC) 25 MG suppository Place 1 suppository (25 mg total) rectally 2 (two) times daily. 12 suppository 0   Omega-3 Fatty Acids (FISH OIL) 1200 MG CAPS Take 1,200 mg by mouth every evening.     ondansetron (ZOFRAN) 8 MG tablet Take 1 tablet (8 mg total) by mouth every 8 (eight) hours as needed for nausea. 20 tablet 0   Polyethyl Glycol-Propyl Glycol 0.4-0.3 % SOLN Place 1 drop into both eyes 2 (two) times daily as needed (for dry/irritated eyes.).     Rivaroxaban (XARELTO) 15 MG TABS tablet Take 1 tablet (15 mg total) by mouth daily with supper. 30 tablet 1   traMADol (ULTRAM) 50 MG tablet Take 1 tablet (50 mg total) by mouth every 6 (six) hours as needed (pain). (Patient taking differently: Take 25 mg by mouth 2 (two) times daily.) 28 tablet 0   triamcinolone ointment (KENALOG) 0.5 % APPLY TO ITCHY RASH ON EXTREMITIES AND WAIST  TWICE A DAY FOR 15 DAYS. DO NOT APPLY TO FACE 30 g 0   Vitamin Mixture (SELENIUM-VITAMIN E) 50-400 MCG-UNIT CAPS Take 1 capsule by mouth every evening.     XARELTO 20 MG TABS tablet TAKE 1 TABLET BY MOUTH DAILY WITH SUPPER. 30 tablet 5   No current facility-administered medications for this visit.    REVIEW OF SYSTEMS:   .10 Point review of Systems was done is negative except as noted above.   PHYSICAL EXAMINATION: ECOG PERFORMANCE STATUS: 1 - Symptomatic but completely ambulatory   Exam was given in a chair. .BP (!) 153/64   Pulse 80   Temp 98.1 F (36.7 C)   Resp 18   Wt 196 lb 11.2 oz (89.2 kg)   SpO2 99%   BMI 28.22 kg/m  . GENERAL:alert, in no acute distress and comfortable SKIN: no acute rashes, no significant lesions EYES: conjunctiva are pink and non-injected, sclera anicteric OROPHARYNX: MMM, no exudates, no oropharyngeal erythema or ulceration NECK: supple, no JVD LYMPH:  no palpable lymphadenopathy in the cervical, axillary or inguinal regions LUNGS: clear to auscultation  HEART: regular rate & rhythm ABDOMEN:  normoactive bowel sounds , non tender, not distended. Extremity: Trace pedal edema PSYCH: alert & oriented x 3 with fluent speech NEURO: no focal motor/sensory deficits    LABORATORY DATA:  I have reviewed the data as listed  . CBC Latest Ref Rng & Units 09/02/2021 01/22/2021 07/25/2020  WBC 4.0 - 10.5 K/uL 2.0(L) 2.5(L) 3.4(L)  Hemoglobin 13.0 - 17.0 g/dL 9.8(L) 13.3 13.9  Hematocrit 39.0 - 52.0 % 28.7(L) 38.5(L) 40.4  Platelets 150 - 400 K/uL 131(L) 115(L) 131(L)    . CMP Latest Ref Rng & Units 09/02/2021 01/22/2021 07/25/2020  Glucose 70 - 99 mg/dL 102(H) 177(H) 188(H)  BUN 8 - 23 mg/dL 15 22 17  Creatinine 0.61 - 1.24 mg/dL 1.12 1.37(H) 1.10  Sodium 135 - 145 mmol/L 138 141 137  Potassium 3.5 - 5.1 mmol/L 4.2 4.0 3.9  Chloride 98 - 111 mmol/L 105 102 103  CO2 22 - 32 mmol/L _0 Calcium 8.9 - 10.3 mg/dL 8.9 9.0 9.1  Total Protein 6.5  - 8.1 g/dL 6.6 6.9 6.5  Total Bilirubin 0.3 - 1.2 mg/dL 1.0 1.1 0.8  Alkaline Phos 38 - 126 U/L 118 95 79  AST 15 - 41 U/L _1 ALT 0 - 44 U/L 30 35 27   . Lab Results  Component Value Date   LDH 186 09/02/2021    04/12/18 Flow Cytometry:   04/12/18 Lung biopsy:     RADIOGRAPHIC STUDIES: I have personally reviewed the radiological images as listed and agreed with the findings in the report. No results found.   ASSESSMENT & PLAN:   80 y.o. male with  1. 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  If this is an isolated lesion this would make in Stage IE.  04/12/18 bx/resection of the right middle lobe revealed an extranodal marginal zone lymphoma   07/21/18 PET/CT revealed Interval resection of the RIGHT middle lobe pulmonary nodule. 2. Increase thickening of subpleural tissue in the RIGHT upper lobe with increased metabolic activity. In patient with lymphoma history, cannot exclude atypical thoracic lymphoma. 3. No change in small hypermetabolic mediastinal lymph nodes which are favored reactive. 4. Prostatomegaly with nodularity. 5. Stable RIGHT adrenal mass. Favor benign.  03/11/19 CT Chest revealed "Ill-defined ground-glass and consolidative nodules in both lungs remain stable, except for mild increase in size of 1.6 cm central right perihilar nodule. Consider continued followup by chest CT in 6 months. 2. Stable mild partially-calcified mediastinal and bilateral hilar lymphadenopathy. 3. Stable 2.6 cm right adrenal mass,, likely a benign adenoma. Recommend continued attention on follow-up CT. 4. Aortic and coronary artery atherosclerosis."   09/18/2019 CT Angio Chest/Abd/Pel ([id number]) revealed "1. No acute vascular findings. 2. Trace new right pleural effusion, cause uncertain. 3. Similar sub solid nodules and sub solid right subpleural airspace opacity. In this patient with history of lymphoma, the possibility of atypical pulmonary involvement is not readily  excluded. Surveillance imaging of the lung lesions is recommended. 4. Nonspecific 1.8 cm focus of arterial phase enhancement in segment 2 of the liver. There is no prior accentuated metabolic activity in this vicinity on prior PET-CT of 07/21/2018. Possibilities for further workup of this lesion include hepatic protocol MRI with and without contrast, or surveillance on follow up imaging related to the patient's lymphoma. 5. Chronically stable right adrenal mass, likely benign. 6. Other imaging findings of potential clinical significance: Aortic Atherosclerosis (ICD10-I70.0). Coronary atherosclerosis. Mild cardiomegaly. Calcified mediastinal adenopathy, stable. Postoperative findings in the right upper lobe. Stable small well-defined foci of sclerosis in the right fourth rib. Chronic superior endplate compression at L1. Right foraminal stenosis at L4-5 due to spurring. Lipoma deep to the left gluteus maximus muscle, stable, this mildly abuts the left sciatic nerve. Prostatomegaly."  03/12/2020 PET/CT ([id number]) revealed "1. Interval increase in size and FDG uptake associated with subpleural soft tissue within the anterior right upper lobe compatible with Deauville criteria 4/5 disease. 2. Paravertebral soft tissue within the posterior mediastinum medial to the descending aorta exhibits increased FDG uptake. Deauville criteria 4. 3. Mild increase in size and degree of FDG uptake associated with bilateral pulmonary nodules. 4. Similar mild to moderate increased FDG uptake associated with calcified  mediastinal and hilar lymph nodes. This is a nonspecific finding and may reflect changes of granulomatous inflammation or infection. 5. Similar appearance of indeterminate right adrenal nodule which exhibits mild to moderate FDG uptake with SUV max of 5.4 6. No significant FDG uptake within the liver."  03/12/2020 MRI Liver ([id number]) revealed benign hemangioma, no hypermetabolic lesion  2) Pancytopenia with  macrocytic anemia. Patient with worsening leukopenia and anemia concerning for possible bone marrow involvement from his lymphoma versus MDS.  3) Severe hearing loss-which significantly limits patient's ability to hear and communicate.  Wife is helping him with most of the communication.  We used a stethoscope in his ears and his spoke to him through the [facility] with some benefits. PLAN:  -Discussed his labs from today 09/02/2021 Labs done today 09/02/2021 show CBC with a hemoglobin of 9.8, MCV of 111.2, WBC count of 2k platelets of 131k. CMP unremarkable LDH 186 -We discussed that he has worsening anemia and leukopenia as well as persistent thrombocytopenia and we were concerned that he does have progression of his extranodal marginal zone lymphoma and possible bone marrow involvement with this. -We discussed that with his macrocytic anemia we would also be concerned about possible MDS and would do additional labs to rule out other possible etiologies. -Patient and wife wanted to hold off on additional labs today and hold off on considerations of her bone marrow biopsy at this time. -Patient and his wife are agreeable to get his CT chest that has been pending and we also added a CT abdomen and pelvis to evaluate for splenomegaly and other evidence of lymphoma progression. -They want to keep an eye on his labs in 3 months and not pursue additional work-up at this time. -My nurse gave him scheduling information to be scheduled his CT chest as well as a CT abdomen and pelvis -He was also recommended to follow-up with his primary care physician to address his severe hearing deficits which is significantly limiting his quality of life and situational awareness. 4.  Patient Active Problem List   Diagnosis Date Noted   History of hearing loss 06/21/2020   Impacted cerumen of left ear 06/21/2020   Marginal zone lymphoma ([place]) 03/25/2020   Counseling regarding advance care planning and goals of care  03/25/2020   Renal insufficiency 04/19/2018   Chronic anticoagulation 04/19/2018   Lung mass 04/12/2018   Permanent atrial fibrillation ([place]) 01/19/2018   Pneumothorax 01/25/2016   Pneumothorax after biopsy 01/25/2016   Coronary artery calcification seen on CT scan 01/21/2016   Murmur, cardiac 01/21/2016   Hypercholesterolemia 01/21/2016   Essential hypertension 01/09/2016   BPH (benign prostatic hyperplasia) 01/09/2016   Solitary pulmonary nodule 01/09/2016   Thrombocytopenia ([place]) 01/09/2016   Cervical spondylosis without myelopathy 12/01/2013   Lumbago 12/01/2013   Diabetes mellitus     FOLLOW UP: CT chest abdomen pelvis in 1 week Return to clinic with Dr. Irene Limbo with labs in 3 months  . The total time spent in the appointment was 30 minutes and more than 50% was on counseling and direct patient cares.    All of the patient's questions were answered with apparent satisfaction. The patient knows to call the clinic with any problems, questions or concerns.    Sullivan Lone MD [place] AAHIVMS [facility] Hematology/Oncology Physician [facility]

## 2021-09-16 ENCOUNTER — Other Ambulatory Visit: Payer: Self-pay

## 2021-09-16 ENCOUNTER — Encounter (HOSPITAL_COMMUNITY): Payer: Self-pay

## 2021-09-16 ENCOUNTER — Ambulatory Visit (HOSPITAL_COMMUNITY)
Admission: RE | Admit: 2021-09-16 | Discharge: 2021-09-16 | Disposition: A | Discharge status: Home / Self Care | Payer: PPO | Source: Ambulatory Visit | Attending: Hematology | Admitting: Hematology

## 2021-09-16 DIAGNOSIS — K7689 Other specified diseases of liver: Secondary | ICD-10-CM | POA: Diagnosis not present

## 2021-09-16 DIAGNOSIS — N402 Nodular prostate without lower urinary tract symptoms: Secondary | ICD-10-CM | POA: Diagnosis not present

## 2021-09-16 DIAGNOSIS — R918 Other nonspecific abnormal finding of lung field: Secondary | ICD-10-CM | POA: Diagnosis not present

## 2021-09-16 DIAGNOSIS — C884 Extranodal marginal zone B-cell lymphoma of mucosa-associated lymphoid tissue [MALT-lymphoma]: Secondary | ICD-10-CM | POA: Diagnosis not present

## 2021-09-16 DIAGNOSIS — C8580 Other specified types of non-Hodgkin lymphoma, unspecified site: Secondary | ICD-10-CM | POA: Diagnosis not present

## 2021-09-16 DIAGNOSIS — I898 Other specified noninfective disorders of lymphatic vessels and lymph nodes: Secondary | ICD-10-CM | POA: Diagnosis not present

## 2021-09-16 MED ORDER — IOHEXOL 350 MG/ML SOLN
75.0000 mL | Freq: Once | INTRAVENOUS | Status: AC | PRN
  Administered 2021-09-16: 75 mL via INTRAVENOUS

## 2021-10-04 DIAGNOSIS — E1169 Type 2 diabetes mellitus with other specified complication: Secondary | ICD-10-CM | POA: Diagnosis not present

## 2021-10-04 DIAGNOSIS — Z0000 Encounter for general adult medical examination without abnormal findings: Secondary | ICD-10-CM | POA: Diagnosis not present

## 2021-10-04 DIAGNOSIS — C8582 Other specified types of non-Hodgkin lymphoma, intrathoracic lymph nodes: Secondary | ICD-10-CM | POA: Diagnosis not present

## 2021-10-04 DIAGNOSIS — N400 Enlarged prostate without lower urinary tract symptoms: Secondary | ICD-10-CM | POA: Diagnosis not present

## 2021-10-04 DIAGNOSIS — I10 Essential (primary) hypertension: Secondary | ICD-10-CM | POA: Diagnosis not present

## 2021-10-04 DIAGNOSIS — J309 Allergic rhinitis, unspecified: Secondary | ICD-10-CM | POA: Diagnosis not present

## 2021-10-04 DIAGNOSIS — E782 Mixed hyperlipidemia: Secondary | ICD-10-CM | POA: Diagnosis not present

## 2021-10-04 DIAGNOSIS — M545 Low back pain, unspecified: Secondary | ICD-10-CM | POA: Diagnosis not present

## 2021-10-04 DIAGNOSIS — Z1389 Encounter for screening for other disorder: Secondary | ICD-10-CM | POA: Diagnosis not present

## 2021-10-04 DIAGNOSIS — D6869 Other thrombophilia: Secondary | ICD-10-CM | POA: Diagnosis not present

## 2021-10-04 DIAGNOSIS — I4891 Unspecified atrial fibrillation: Secondary | ICD-10-CM | POA: Diagnosis not present

## 2021-10-04 DIAGNOSIS — I700 Atherosclerosis of aorta: Secondary | ICD-10-CM | POA: Diagnosis not present

## 2021-10-09 DIAGNOSIS — J22 Unspecified acute lower respiratory infection: Secondary | ICD-10-CM | POA: Diagnosis not present

## 2021-11-22 ENCOUNTER — Other Ambulatory Visit: Payer: Self-pay

## 2021-11-22 ENCOUNTER — Ambulatory Visit (INDEPENDENT_AMBULATORY_CARE_PROVIDER_SITE_OTHER): Payer: PPO | Admitting: Cardiovascular Disease

## 2021-11-22 ENCOUNTER — Encounter: Payer: Self-pay | Admitting: Cardiovascular Disease

## 2021-11-22 VITALS — BP 130/60 | HR 76 | Ht 70.0 in | Wt 190.8 lb

## 2021-11-22 DIAGNOSIS — D61818 Other pancytopenia: Secondary | ICD-10-CM

## 2021-11-22 DIAGNOSIS — D6869 Other thrombophilia: Secondary | ICD-10-CM | POA: Diagnosis not present

## 2021-11-22 DIAGNOSIS — C8580 Other specified types of non-Hodgkin lymphoma, unspecified site: Secondary | ICD-10-CM

## 2021-11-22 DIAGNOSIS — I4821 Permanent atrial fibrillation: Secondary | ICD-10-CM

## 2021-11-22 DIAGNOSIS — I10 Essential (primary) hypertension: Secondary | ICD-10-CM

## 2021-11-22 DIAGNOSIS — I2510 Atherosclerotic heart disease of native coronary artery without angina pectoris: Secondary | ICD-10-CM | POA: Diagnosis not present

## 2021-11-22 DIAGNOSIS — E7800 Pure hypercholesterolemia, unspecified: Secondary | ICD-10-CM

## 2021-11-22 NOTE — Patient Instructions (Addendum)
Medication Instructions:  XARELTO: Take 20 mg once daily   Please call Lattie Haw at [phone] with the correct Amlodipine dosage  *If you need a refill on your cardiac medications before your next appointment, please call your pharmacy*   Lab Work: None ordered If you have labs (blood work) drawn today and your tests are completely normal, you will receive your results only by: [place] (if you have MyChart) OR A paper copy in the mail If you have any lab test that is abnormal or we need to change your treatment, we will call you to review the results.   Testing/Procedures: None ordered   Follow-Up: At [facility], you and your health needs are our priority.  As part of our continuing mission to provide you with exceptional heart care, we have created designated Provider Care Teams.  These Care Teams include your primary Cardiologist (physician) and Advanced Practice Providers (APPs -  Physician Assistants and Nurse Practitioners) who all work together to provide you with the care you need, when you need it.  We recommend signing up for the patient portal called "MyChart".  Sign up information is provided on this After Visit Summary.  MyChart is used to connect with patients for Virtual Visits (Telemedicine).  Patients are able to view lab/test results, encounter notes, upcoming appointments, etc.  Non-urgent messages can be sent to your provider as well.   To learn more about what you can do with MyChart, go to [email or web address].    Your next appointment:   12 month(s)  The format for your next appointment:   In Person  Provider:   Sanda Klein, MD

## 2021-11-22 NOTE — Progress Notes (Signed)
Patient ID: James Sweeney, male   DOB: 10-30-1941, 81 y.o.   MRN: [id number]     Cardiology Office Note    Date:  11/22/2021   ID:  James Sweeney, DOB 22-Apr-1941, MRN [id number]  PCP:  Lois Huxley, PA  Cardiologist:   Sanda Klein, MD   Chief Complaint  Patient presents with   Atrial Fibrillation    History of Present Illness:  James Sweeney is a 81 y.o. male with longstanding persistent atrial fibrillation, numerous coronary risk factors,  benign 2017 workup for coronary artery calcification seen on chest CT, performed for lung mass, returning for follow-up (repeat biopsy of the lung mass shows that he has a MALT lymphoma, currently being treated with observation).  He is severely hard of hearing.  He tells me that he is tried 3 different hearing aids, the most recent one cost $2200 , and none of them have helped.  He partially reads lips and understood be much better when I was not wearing a mask.  He is in atrial fibrillation and completely oblivious to the arrhythmia.  The ventricular rate is spontaneously controlled without medications.  He has not had any focal neurological events.  He does not experience palpitations.  He has not had any serious falls, injuries or bleeding problems.  He had been on rivaroxaban 15 mg daily since he had borderline kidney function and easy bruising, but his kidney function has improved and his most recent creatinine was essentially normal at 1.06.  He is currently taking rivaroxaban 20 mg daily without problems.  Medication lists include 2 doses for amlodipine, 5 mg and 10 mg.  He is not sure which when he is taking.  He will call us when he gets home and looks at the bottle.  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  He denies any issues with shortness of breath or chest pain either at rest or with activity.  He does not have orthopnea, PND or edema.  He has not experienced syncope.    He has  symptoms of symmetrical peripheral neuropathy of the lower extremities and "cold feet all the time" but a previous duplex ultrasound showed no evidence of arterial obstruction (07/29/2019).    When coronary artery calcifications were incidentally noted on chest CT in 2017 he underwent a nuclear stress test and echocardiogram.  Neither study showed any evidence of coronary insufficiency.  He does have mild LVH and left atrial dilation and aortic valve sclerosis.  Cardioversion has been offered in the past but he has preferred conservative management.  CBC in April 2022 showed leukopenia with a white blood cell count of 2.5K and mild thrombocytopenia at 115 K.   Most recent labs performed in October 2022 showed further reduction in WBC count to 2.0K and mild macrocytic anemia with a hemoglobin of 9.8 and MCV of 111.  The platelet count remains only mildly abnormal at 131K. At his last interview with his hematologist/oncologist, there was concern about possible progression of his lymphoma or development of myelodysplasia.  Bone marrow biopsy and labs were offered, but declined by the patient.   Past Medical History:  Diagnosis Date   Cervical spondylosis without myelopathy 12/01/2013   Diabetes mellitus    borderline   Enlarged prostate    Frequency of urination    Hearing deficit    Heart murmur    History of kidney stones    removed with Lithotripsy  HOH (hard of hearing)    right ear   Hyperlipemia    Hypertension    Lumbago 12/01/2013   Pneumonia 2016   Polio    right leg smaller than left   Shortness of breath dyspnea    with exertion    Past Surgical History:  Procedure Laterality Date   CATARACT EXTRACTION Bilateral 2014   LUNG BIOPSY Right 2018   March   VIDEO ASSISTED THORACOSCOPY (VATS)/WEDGE RESECTION Right 04/12/2018   Procedure: VIDEO ASSISTED THORACOSCOPY (VATS), WEDGE RESECTION RIGHT MIDDLE LOBE, PLACEMENT OF ON Q CATHETER;  Surgeon: [name], MD;  Location: [facility];  Service: Thoracic;  Laterality: Right;   VIDEO BRONCHOSCOPY N/A 04/12/2018   Procedure: VIDEO BRONCHOSCOPY;  Surgeon: [name], MD;  Location: [facility];  Service: Thoracic;  Laterality: N/A;   VIDEO BRONCHOSCOPY WITH ENDOBRONCHIAL NAVIGATION N/A 01/25/2016   Procedure: VIDEO BRONCHOSCOPY WITH ENDOBRONCHIAL NAVIGATION;  Surgeon: [name], MD;  Location: [facility];  Service: Thoracic;  Laterality: N/A;   VIDEO BRONCHOSCOPY WITH ENDOBRONCHIAL ULTRASOUND N/A 01/25/2016   Procedure: VIDEO BRONCHOSCOPY WITH ENDOBRONCHIAL ULTRASOUND;  Surgeon: [name], MD;  Location: [facility];  Service: Thoracic;  Laterality: N/A;    Outpatient Medications Prior to Visit  Medication Sig Dispense Refill   alfuzosin (UROXATRAL) 10 MG 24 hr tablet Take 10 mg by mouth at bedtime.      amLODipine (NORVASC) 10 MG tablet Take 10 mg by mouth daily.     amLODipine (NORVASC) 5 MG tablet      Ascorbic Acid (SUPER C COMPLEX PO) Take 1 capsule by mouth every evening. Super C 1000     atorvastatin (LIPITOR) 20 MG tablet TAKE 1 TABLET BY MOUTH EVERY DAY 90 tablet 1   Calcium Carb-Cholecalciferol (CALCIUM 600+D3 PO) Take 1 tablet by mouth every evening.     Cyanocobalamin (VITAMIN B-12) 5000 MCG SUBL Place 1 tablet under the tongue every evening.      Ferrous Sulfate (IRON) 28 MG TABS Take 28 mg by mouth every evening.     finasteride (PROSCAR) 5 MG tablet Take 5 mg by mouth at bedtime.      hydrochlorothiazide (HYDRODIURIL) 12.5 MG tablet Take 12.5 mg by mouth daily.     hydrocortisone (ANUSOL-HC) 25 MG suppository Place 1 suppository (25 mg total) rectally 2 (two) times daily. 12 suppository 0   loratadine (CLARITIN) 10 MG tablet Take 10 mg by mouth as needed.     Omega-3 Fatty Acids (FISH OIL) 1200 MG CAPS Take 1,200 mg by mouth every evening.     ondansetron (ZOFRAN) 8 MG tablet Take 1 tablet (8 mg total) by mouth every 8 (eight) hours as needed for nausea. 20 tablet 0   Polyethyl Glycol-Propyl Glycol  0.4-0.3 % SOLN Place 1 drop into both eyes 2 (two) times daily as needed (for dry/irritated eyes.).     traMADol (ULTRAM) 50 MG tablet Take 1 tablet (50 mg total) by mouth every 6 (six) hours as needed (pain). (Patient taking differently: Take 25 mg by mouth 2 (two) times daily.) 28 tablet 0   triamcinolone ointment (KENALOG) 0.5 % APPLY TO ITCHY RASH ON EXTREMITIES AND WAIST TWICE A DAY FOR 15 DAYS. DO NOT APPLY TO FACE 30 g 0   Vitamin Mixture (SELENIUM-VITAMIN E) 50-400 MCG-UNIT CAPS Take 1 capsule by mouth every evening.     XARELTO 20 MG TABS tablet TAKE 1 TABLET BY MOUTH DAILY WITH SUPPER. 30 tablet 5   Rivaroxaban (XARELTO)  15 MG TABS tablet Take 1 tablet (15 mg total) by mouth daily with supper. 30 tablet 1   No facility-administered medications prior to visit.     Allergies:   Losartan potassium-hctz, Levaquin [levofloxacin in d5w], and Penicillins   Social History   Socioeconomic History   Marital status: Married    Spouse name: Not on file   Number of children: 2   Years of education: Not on file   Highest education level: Not on file  Occupational History   Not on file  Tobacco Use   Smoking status: Former    Packs/day: 0.50    Years: 9.00    Pack years: 4.50    Types: Cigarettes    Quit date: 12/02/1967    Years since quitting: 54.0   Smokeless tobacco: Former    Types: Chew    Quit date: 2000  Vaping Use   Vaping Use: Never used  Substance and Sexual Activity   Alcohol use: No   Drug use: No   Sexual activity: Never  Other Topics Concern   Not on file  Social History Narrative   Patient is right handed.   Patient drinks 1 cup caffeine daily.      Pt has been married for 33 years and has 2 children, 4 grandchildren, and 3 great-grandchildren. Lives with everyone except great-grandchildren. He does not clean house, but can shop, do yard work, and drive. 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         Epworth Sleepiness Scale Score:  8       --I have HTN   --I seem to be losing my sex drive   --I wake up to urinate frequently at night   --I awake feeling not rested   --I have borderline diabetes   Social Determinants of Health   Financial Resource Strain: Not on file  Food Insecurity: Not on file  Transportation Needs: Not on file  Physical Activity: Not on file  Stress: Not on file  Social Connections: Not on file     Family History:  The patient's family history includes Cancer in his mother; Cancer - Lung in his father; Diabetes in his brother; Heart attack in his brother.   ROS:   Please see the history of present illness.   All other systems are reviewed and are negative.   PHYSICAL EXAM:   VS:  BP 130/60    Pulse 76    Ht 5' 10" (1.778 m)    Wt 190 lb 12.8 oz (86.5 kg)    SpO2 98%    BMI 27.38 kg/m      General: Alert, oriented x3, no distress Head: no evidence of trauma, PERRL, EOMI, no exophtalmos or lid lag, no myxedema, no xanthelasma; normal ears, nose and oropharynx Neck: normal jugular venous pulsations and no hepatojugular reflux; brisk carotid pulses without delay and no carotid bruits Chest: clear to auscultation, no signs of consolidation by percussion or palpation, normal fremitus, symmetrical and full respiratory excursions Cardiovascular: normal position and quality of the apical impulse, irregular rhythm, normal first and second heart sounds, faint short aortic ejection murmur, no diastolic murmurs, rubs or gallops Abdomen: no tenderness or distention, no masses by palpation, no abnormal pulsatility or arterial bruits, normal bowel sounds, no hepatosplenomegaly Extremities: no clubbing, cyanosis or edema; 2+ radial, ulnar and brachial pulses bilaterally; 2+ right femoral, posterior tibial and dorsalis pedis pulses; 2+ left femoral, posterior tibial and dorsalis pedis pulses;  no subclavian or femoral bruits Neurological: grossly nonfocal Psych: Normal mood and affect   Wt Readings from Last  3 Encounters:  11/22/21 190 lb 12.8 oz (86.5 kg)  09/02/21 196 lb 11.2 oz (89.2 kg)  01/22/21 189 lb 11.2 oz (86 kg)      Studies/Labs Reviewed:  09 26 2020 lower extremity arterial Dopplers  without evidence of obstruction  ECHO 2017: - Left ventricle: The cavity size was normal. There was mild    concentric hypertrophy. Systolic function was vigorous. The    estimated ejection fraction was in the range of 65% to 70%. Wall    motion was normal; there were no regional wall motion    abnormalities. Doppler parameters are consistent with abnormal    left ventricular relaxation (grade 1 diastolic dysfunction).    There was no evidence of elevated ventricular filling pressure by    Doppler parameters.  - Aortic valve: Trileaflet; normal thickness leaflets. Valve area    (Vmax): 1.58 cm^2.  - Aortic root: The aortic root was normal in size.  - Left atrium: The atrium was mildly dilated.  - Right ventricle: Systolic function was normal.  - Right atrium: The atrium was normal in size.  - Tricuspid valve: There was trivial regurgitation.  - Pulmonary arteries: Systolic pressure was within the normal    range.  - Inferior vena cava: The vessel was normal in size.  - Pericardium, extracardiac: There was no pericardial effusion.   Lexiscan nuclear perfusion study 2017:  The left ventricular ejection fraction is normal (55-65%). Nuclear stress EF: 57%. The study is normal. This is a low risk study. There was no ST segment deviation noted during stress.   Low risk stress nuclear study with normal perfusion and normal left ventricular regional and global systolic function.      EKG:  EKG is ordered today. It shows atrial fibrillation at 71 bpm, otw normal. Recent Labs: 09/02/2021: ALT 30; BUN 15; Creatinine 1.12; Hemoglobin 9.8; Platelets 131; Potassium 4.2; Sodium 138   Lipid Panel No results found for: CHOL, TRIG, HDL, CHOLHDL, VLDL, LDLCALC, LDLDIRECT,  LABVLDL  10/04/2021 Cholesterol 109, HDL 56, LDL 44, triglycerides 109 Hemoglobin A1c 5.8% Creatinine 1.06, BUN 15   ASSESSMENT:    1. Permanent atrial fibrillation ([place])   2. Acquired thrombophilia ([place])   3. Coronary artery calcification seen on CT scan   4. 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 Pancytopenia ([place])   6. Essential hypertension   7. Pure hypercholesterolemia       PLAN:  In order of problems listed above:   AFib: Rate is spontaneously controlled consistent with underlying conduction system disease.  Permanent arrhythmia.[name]   CHADSVasc at least 3, possibly as high as 5 (age 50, HTN, +/-CAD, +/- borderline DM).   Anticoagulation: Has not had recent falls or easy bruising.  Creatinine has improved.  Current rivaroxaban dose is 20 mg daily. Coronary calcification: asymptomatic. Normal Lexiscan Myoview in March 2017.   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 Last f/u w Dr. Irene Limbo was in April 2022, telephonically.  Some concern about possible progression of disease or development of myelodysplasia due to worsening blood counts.  Most recent CBC on 09/02/2021 showed WBC 2.0, hemoglobin 9.8 with macrocytic indices, platelets 131k.  I encouraged him to keep his appointment with Dr. Irene Limbo scheduled in a couple of weeks.  I also advised that he bring his wife with him since he is so hard  of hearing. HTN: Well-controlled. HLP: On statin with LDL well within target range (less than 70).  Medication Adjustments/Labs and Tests Ordered: Current medicines are reviewed at length with the patient today.  Concerns regarding medicines are outlined above.  Medication changes, Labs and Tests ordered today are listed in the Patient Instructions below. Patient Instructions  Medication Instructions:  XARELTO: Take 20 mg once daily   Please call Lattie Haw at [phone] with the correct Amlodipine dosage  *If you need a refill on your cardiac  medications before your next appointment, please call your pharmacy*   Lab Work: None ordered If you have labs (blood work) drawn today and your tests are completely normal, you will receive your results only by: [place] (if you have MyChart) OR A paper copy in the mail If you have any lab test that is abnormal or we need to change your treatment, we will call you to review the results.   Testing/Procedures: None ordered   Follow-Up: At [facility], you and your health needs are our priority.  As part of our continuing mission to provide you with exceptional heart care, we have created designated Provider Care Teams.  These Care Teams include your primary Cardiologist (physician) and Advanced Practice Providers (APPs -  Physician Assistants and Nurse Practitioners) who all work together to provide you with the care you need, when you need it.  We recommend signing up for the patient portal called "MyChart".  Sign up information is provided on this After Visit Summary.  MyChart is used to connect with patients for Virtual Visits (Telemedicine).  Patients are able to view lab/test results, encounter notes, upcoming appointments, etc.  Non-urgent messages can be sent to your provider as well.   To learn more about what you can do with MyChart, go to [email or web address].    Your next appointment:   12 month(s)  The format for your next appointment:   In Person  Provider:   Sanda Klein, MD          Signed, Sanda Klein, MD  11/22/2021 1:53 PM    [facility] [address], [place], [place]  [address] Phone: [phone]; Fax: [phone]

## 2021-12-04 ENCOUNTER — Ambulatory Visit: Payer: PPO | Admitting: Hematology

## 2021-12-04 ENCOUNTER — Other Ambulatory Visit: Payer: PPO

## 2022-01-03 DIAGNOSIS — R509 Fever, unspecified: Secondary | ICD-10-CM | POA: Diagnosis not present

## 2022-01-03 DIAGNOSIS — J189 Pneumonia, unspecified organism: Secondary | ICD-10-CM | POA: Diagnosis not present

## 2022-01-03 DIAGNOSIS — R062 Wheezing: Secondary | ICD-10-CM | POA: Diagnosis not present

## 2022-01-03 DIAGNOSIS — Z03818 Encounter for observation for suspected exposure to other biological agents ruled out: Secondary | ICD-10-CM | POA: Diagnosis not present

## 2022-01-03 DIAGNOSIS — R058 Other specified cough: Secondary | ICD-10-CM | POA: Diagnosis not present

## 2022-01-05 ENCOUNTER — Inpatient Hospital Stay (HOSPITAL_COMMUNITY)
Admission: EM | Admit: 2022-01-05 | Discharge: 2022-01-10 | DRG: 193 | Disposition: A | Discharge status: Home Health | Payer: PPO | Attending: Internal Medicine | Admitting: Internal Medicine

## 2022-01-05 ENCOUNTER — Encounter (HOSPITAL_COMMUNITY): Payer: Self-pay

## 2022-01-05 ENCOUNTER — Emergency Department (HOSPITAL_COMMUNITY): Payer: PPO

## 2022-01-05 DIAGNOSIS — H9190 Unspecified hearing loss, unspecified ear: Secondary | ICD-10-CM | POA: Diagnosis not present

## 2022-01-05 DIAGNOSIS — I5033 Acute on chronic diastolic (congestive) heart failure: Secondary | ICD-10-CM | POA: Diagnosis present

## 2022-01-05 DIAGNOSIS — N1831 Chronic kidney disease, stage 3a: Secondary | ICD-10-CM | POA: Diagnosis present

## 2022-01-05 DIAGNOSIS — Z20822 Contact with and (suspected) exposure to covid-19: Secondary | ICD-10-CM | POA: Diagnosis present

## 2022-01-05 DIAGNOSIS — C884 Extranodal marginal zone B-cell lymphoma of mucosa-associated lymphoid tissue [MALT-lymphoma]: Secondary | ICD-10-CM | POA: Diagnosis present

## 2022-01-05 DIAGNOSIS — I4821 Permanent atrial fibrillation: Secondary | ICD-10-CM

## 2022-01-05 DIAGNOSIS — D649 Anemia, unspecified: Secondary | ICD-10-CM

## 2022-01-05 DIAGNOSIS — D696 Thrombocytopenia, unspecified: Secondary | ICD-10-CM

## 2022-01-05 DIAGNOSIS — I4891 Unspecified atrial fibrillation: Secondary | ICD-10-CM | POA: Diagnosis not present

## 2022-01-05 DIAGNOSIS — R042 Hemoptysis: Secondary | ICD-10-CM | POA: Diagnosis not present

## 2022-01-05 DIAGNOSIS — D6859 Other primary thrombophilia: Secondary | ICD-10-CM | POA: Diagnosis not present

## 2022-01-05 DIAGNOSIS — D849 Immunodeficiency, unspecified: Secondary | ICD-10-CM | POA: Diagnosis present

## 2022-01-05 DIAGNOSIS — E871 Hypo-osmolality and hyponatremia: Secondary | ICD-10-CM | POA: Diagnosis present

## 2022-01-05 DIAGNOSIS — Z888 Allergy status to other drugs, medicaments and biological substances status: Secondary | ICD-10-CM

## 2022-01-05 DIAGNOSIS — Z880 Allergy status to penicillin: Secondary | ICD-10-CM

## 2022-01-05 DIAGNOSIS — E861 Hypovolemia: Secondary | ICD-10-CM | POA: Diagnosis not present

## 2022-01-05 DIAGNOSIS — Z7901 Long term (current) use of anticoagulants: Secondary | ICD-10-CM | POA: Diagnosis not present

## 2022-01-05 DIAGNOSIS — J9601 Acute respiratory failure with hypoxia: Secondary | ICD-10-CM | POA: Diagnosis not present

## 2022-01-05 DIAGNOSIS — E7800 Pure hypercholesterolemia, unspecified: Secondary | ICD-10-CM | POA: Diagnosis not present

## 2022-01-05 DIAGNOSIS — Z8612 Personal history of poliomyelitis: Secondary | ICD-10-CM

## 2022-01-05 DIAGNOSIS — R0902 Hypoxemia: Secondary | ICD-10-CM | POA: Diagnosis not present

## 2022-01-05 DIAGNOSIS — N179 Acute kidney failure, unspecified: Secondary | ICD-10-CM

## 2022-01-05 DIAGNOSIS — I10 Essential (primary) hypertension: Secondary | ICD-10-CM

## 2022-01-05 DIAGNOSIS — N401 Enlarged prostate with lower urinary tract symptoms: Secondary | ICD-10-CM | POA: Diagnosis not present

## 2022-01-05 DIAGNOSIS — S0990XA Unspecified injury of head, initial encounter: Secondary | ICD-10-CM | POA: Diagnosis not present

## 2022-01-05 DIAGNOSIS — I5032 Chronic diastolic (congestive) heart failure: Secondary | ICD-10-CM

## 2022-01-05 DIAGNOSIS — E860 Dehydration: Secondary | ICD-10-CM | POA: Diagnosis not present

## 2022-01-05 DIAGNOSIS — R338 Other retention of urine: Secondary | ICD-10-CM | POA: Diagnosis not present

## 2022-01-05 DIAGNOSIS — C8580 Other specified types of non-Hodgkin lymphoma, unspecified site: Secondary | ICD-10-CM

## 2022-01-05 DIAGNOSIS — I130 Hypertensive heart and chronic kidney disease with heart failure and stage 1 through stage 4 chronic kidney disease, or unspecified chronic kidney disease: Secondary | ICD-10-CM | POA: Diagnosis not present

## 2022-01-05 DIAGNOSIS — Z833 Family history of diabetes mellitus: Secondary | ICD-10-CM

## 2022-01-05 DIAGNOSIS — Z8249 Family history of ischemic heart disease and other diseases of the circulatory system: Secondary | ICD-10-CM

## 2022-01-05 DIAGNOSIS — Z800 Family history of malignant neoplasm of digestive organs: Secondary | ICD-10-CM

## 2022-01-05 DIAGNOSIS — J123 Human metapneumovirus pneumonia: Secondary | ICD-10-CM | POA: Diagnosis not present

## 2022-01-05 DIAGNOSIS — D6869 Other thrombophilia: Secondary | ICD-10-CM | POA: Diagnosis not present

## 2022-01-05 DIAGNOSIS — D638 Anemia in other chronic diseases classified elsewhere: Secondary | ICD-10-CM | POA: Diagnosis not present

## 2022-01-05 DIAGNOSIS — Z881 Allergy status to other antibiotic agents status: Secondary | ICD-10-CM

## 2022-01-05 DIAGNOSIS — I739 Peripheral vascular disease, unspecified: Secondary | ICD-10-CM | POA: Diagnosis not present

## 2022-01-05 DIAGNOSIS — D61818 Other pancytopenia: Secondary | ICD-10-CM | POA: Diagnosis not present

## 2022-01-05 DIAGNOSIS — I6523 Occlusion and stenosis of bilateral carotid arteries: Secondary | ICD-10-CM | POA: Diagnosis not present

## 2022-01-05 DIAGNOSIS — Z9889 Other specified postprocedural states: Secondary | ICD-10-CM | POA: Diagnosis not present

## 2022-01-05 DIAGNOSIS — R531 Weakness: Secondary | ICD-10-CM | POA: Diagnosis not present

## 2022-01-05 DIAGNOSIS — M47812 Spondylosis without myelopathy or radiculopathy, cervical region: Secondary | ICD-10-CM | POA: Diagnosis not present

## 2022-01-05 DIAGNOSIS — Z87442 Personal history of urinary calculi: Secondary | ICD-10-CM

## 2022-01-05 DIAGNOSIS — E119 Type 2 diabetes mellitus without complications: Secondary | ICD-10-CM | POA: Diagnosis not present

## 2022-01-05 DIAGNOSIS — Z8701 Personal history of pneumonia (recurrent): Secondary | ICD-10-CM

## 2022-01-05 DIAGNOSIS — R221 Localized swelling, mass and lump, neck: Secondary | ICD-10-CM | POA: Diagnosis not present

## 2022-01-05 DIAGNOSIS — J189 Pneumonia, unspecified organism: Secondary | ICD-10-CM | POA: Diagnosis not present

## 2022-01-05 DIAGNOSIS — R0602 Shortness of breath: Secondary | ICD-10-CM | POA: Diagnosis not present

## 2022-01-05 DIAGNOSIS — Z9181 History of falling: Secondary | ICD-10-CM

## 2022-01-05 DIAGNOSIS — I517 Cardiomegaly: Secondary | ICD-10-CM

## 2022-01-05 DIAGNOSIS — E1122 Type 2 diabetes mellitus with diabetic chronic kidney disease: Secondary | ICD-10-CM | POA: Diagnosis present

## 2022-01-05 DIAGNOSIS — Z79899 Other long term (current) drug therapy: Secondary | ICD-10-CM

## 2022-01-05 DIAGNOSIS — C8590 Non-Hodgkin lymphoma, unspecified, unspecified site: Secondary | ICD-10-CM | POA: Diagnosis not present

## 2022-01-05 DIAGNOSIS — Z902 Acquired absence of lung [part of]: Secondary | ICD-10-CM

## 2022-01-05 DIAGNOSIS — Z9221 Personal history of antineoplastic chemotherapy: Secondary | ICD-10-CM

## 2022-01-05 DIAGNOSIS — J329 Chronic sinusitis, unspecified: Secondary | ICD-10-CM | POA: Diagnosis not present

## 2022-01-05 DIAGNOSIS — Z87891 Personal history of nicotine dependence: Secondary | ICD-10-CM

## 2022-01-05 LAB — IRON AND TIBC
Iron: 144 ug/dL (ref 45–182)
Saturation Ratios: 90 % — ABNORMAL HIGH (ref 17.9–39.5)
TIBC: 160 ug/dL — ABNORMAL LOW (ref 250–450)
UIBC: 16 ug/dL

## 2022-01-05 LAB — PHOSPHORUS: Phosphorus: 3.1 mg/dL (ref 2.5–4.6)

## 2022-01-05 LAB — CBC
HCT: 26.3 % — ABNORMAL LOW (ref 39.0–52.0)
HCT: 23.2 % — ABNORMAL LOW (ref 39.0–52.0)
Hemoglobin: 8.8 g/dL — ABNORMAL LOW (ref 13.0–17.0)
Hemoglobin: 8.2 g/dL — ABNORMAL LOW (ref 13.0–17.0)
MCH: 38.4 pg — ABNORMAL HIGH (ref 26.0–34.0)
MCH: 39.4 pg — ABNORMAL HIGH (ref 26.0–34.0)
MCHC: 33.5 g/dL (ref 30.0–36.0)
MCHC: 35.3 g/dL (ref 30.0–36.0)
MCV: 114.8 fL — ABNORMAL HIGH (ref 80.0–100.0)
MCV: 111.5 fL — ABNORMAL HIGH (ref 80.0–100.0)
Platelets: 112 10*3/uL — ABNORMAL LOW (ref 150–400)
Platelets: 182 10*3/uL (ref 150–400)
RBC: 2.29 MIL/uL — ABNORMAL LOW (ref 4.22–5.81)
RBC: 2.08 MIL/uL — ABNORMAL LOW (ref 4.22–5.81)
RDW: 14.8 % (ref 11.5–15.5)
RDW: 15.1 % (ref 11.5–15.5)
WBC: 1.6 10*3/uL — ABNORMAL LOW (ref 4.0–10.5)
WBC: 1.5 10*3/uL — ABNORMAL LOW (ref 4.0–10.5)
nRBC: 0 % (ref 0.0–0.2)
nRBC: 0 % (ref 0.0–0.2)

## 2022-01-05 LAB — BLOOD GAS, VENOUS
Acid-Base Excess: 2 mmol/L (ref 0.0–2.0)
Bicarbonate: 26.5 mmol/L (ref 20.0–28.0)
O2 Saturation: 33.5 %
Patient temperature: 37.2
pCO2, Ven: 40 mmHg — ABNORMAL LOW (ref 44–60)
pH, Ven: 7.43 (ref 7.25–7.43)
pO2, Ven: <31 mmHg — CL (ref 32–45)

## 2022-01-05 LAB — DIFFERENTIAL
Abs Immature Granulocytes: 0.01 10*3/uL (ref 0.00–0.07)
Basophils Absolute: 0 10*3/uL (ref 0.0–0.1)
Basophils Relative: 0 %
Eosinophils Absolute: 0 10*3/uL (ref 0.0–0.5)
Eosinophils Relative: 1 %
Immature Granulocytes: 1 %
Lymphocytes Relative: 15 %
Lymphs Abs: 0.2 10*3/uL — ABNORMAL LOW (ref 0.7–4.0)
Monocytes Absolute: 0 10*3/uL — ABNORMAL LOW (ref 0.1–1.0)
Monocytes Relative: 3 %
Neutro Abs: 1.2 10*3/uL — ABNORMAL LOW (ref 1.7–7.7)
Neutrophils Relative %: 80 %

## 2022-01-05 LAB — RETICULOCYTES
Immature Retic Fract: 7.1 % (ref 2.3–15.9)
RBC.: 2.1 MIL/uL — ABNORMAL LOW (ref 4.22–5.81)
Retic Count, Absolute: 33.8 10*3/uL (ref 19.0–186.0)
Retic Ct Pct: 1.6 % (ref 0.4–3.1)

## 2022-01-05 LAB — VITAMIN B12: Vitamin B-12: 1317 pg/mL — ABNORMAL HIGH (ref 180–914)

## 2022-01-05 LAB — RESP PANEL BY RT-PCR (FLU A&B, COVID) ARPGX2
Influenza A by PCR: NEGATIVE
Influenza B by PCR: NEGATIVE
SARS Coronavirus 2 by RT PCR: NEGATIVE

## 2022-01-05 LAB — TSH: TSH: 1.43 u[IU]/mL (ref 0.350–4.500)

## 2022-01-05 LAB — HEPATIC FUNCTION PANEL
ALT: 52 U/L — ABNORMAL HIGH (ref 0–44)
AST: 55 U/L — ABNORMAL HIGH (ref 15–41)
Albumin: 3.7 g/dL (ref 3.5–5.0)
Alkaline Phosphatase: 78 U/L (ref 38–126)
Bilirubin, Direct: 0.4 mg/dL — ABNORMAL HIGH (ref 0.0–0.2)
Indirect Bilirubin: 1 mg/dL — ABNORMAL HIGH (ref 0.3–0.9)
Total Bilirubin: 1.4 mg/dL — ABNORMAL HIGH (ref 0.3–1.2)
Total Protein: 6.6 g/dL (ref 6.5–8.1)

## 2022-01-05 LAB — BASIC METABOLIC PANEL
Anion gap: 9 (ref 5–15)
BUN: 26 mg/dL — ABNORMAL HIGH (ref 8–23)
CO2: 22 mmol/L (ref 22–32)
Calcium: 8.5 mg/dL — ABNORMAL LOW (ref 8.9–10.3)
Chloride: 97 mmol/L — ABNORMAL LOW (ref 98–111)
Creatinine, Ser: 1.52 mg/dL — ABNORMAL HIGH (ref 0.61–1.24)
GFR, Estimated: 46 mL/min — ABNORMAL LOW (ref >60)
Glucose, Bld: 164 mg/dL — ABNORMAL HIGH (ref 70–99)
Potassium: 3.9 mmol/L (ref 3.5–5.1)
Sodium: 128 mmol/L — ABNORMAL LOW (ref 135–145)

## 2022-01-05 LAB — MAGNESIUM: Magnesium: 1.9 mg/dL (ref 1.7–2.4)

## 2022-01-05 LAB — GLUCOSE, CAPILLARY: Glucose-Capillary: 118 mg/dL — ABNORMAL HIGH (ref 70–99)

## 2022-01-05 LAB — LACTIC ACID, PLASMA
Lactic Acid, Venous: 1.3 mmol/L (ref 0.5–1.9)
Lactic Acid, Venous: 1.2 mmol/L (ref 0.5–1.9)

## 2022-01-05 LAB — CK: Total CK: 142 U/L (ref 49–397)

## 2022-01-05 LAB — PROTIME-INR
INR: 2.3 — ABNORMAL HIGH (ref 0.8–1.2)
Prothrombin Time: 25.2 seconds — ABNORMAL HIGH (ref 11.4–15.2)

## 2022-01-05 LAB — OSMOLALITY: Osmolality: 278 mOsm/kg (ref 275–295)

## 2022-01-05 LAB — TROPONIN I (HIGH SENSITIVITY)
Troponin I (High Sensitivity): 19 ng/L — ABNORMAL HIGH (ref <18)
Troponin I (High Sensitivity): 20 ng/L — ABNORMAL HIGH (ref <18)

## 2022-01-05 LAB — HEMOGLOBIN A1C
Hgb A1c MFr Bld: 5.3 % (ref 4.8–5.6)
Mean Plasma Glucose: 105.41 mg/dL

## 2022-01-05 LAB — PREALBUMIN: Prealbumin: 7.2 mg/dL — ABNORMAL LOW (ref 18–38)

## 2022-01-05 LAB — FERRITIN: Ferritin: 421 ng/mL — ABNORMAL HIGH (ref 24–336)

## 2022-01-05 LAB — BRAIN NATRIURETIC PEPTIDE: B Natriuretic Peptide: 216 pg/mL — ABNORMAL HIGH (ref 0.0–100.0)

## 2022-01-05 MED ORDER — SODIUM CHLORIDE 0.9% FLUSH
3.0000 mL | Freq: Two times a day (BID) | INTRAVENOUS | Status: DC
  Administered 2022-01-06 – 2022-01-10 (×5): 3 mL via INTRAVENOUS

## 2022-01-05 MED ORDER — HYDROCODONE-ACETAMINOPHEN 5-325 MG PO TABS
1.0000 | ORAL_TABLET | ORAL | Status: DC | PRN
  Administered 2022-01-07: 1 via ORAL
  Filled 2022-01-05: qty 1
  Filled 2022-01-05: qty 2

## 2022-01-05 MED ORDER — AMLODIPINE BESYLATE 10 MG PO TABS
10.0000 mg | ORAL_TABLET | Freq: Every day | ORAL | Status: DC
  Administered 2022-01-05 – 2022-01-09 (×5): 10 mg via ORAL
  Filled 2022-01-05 (×6): qty 1

## 2022-01-05 MED ORDER — ACETAMINOPHEN 650 MG RE SUPP: 650.0000 mg | Freq: Four times a day (QID) | RECTAL | Status: DC | PRN

## 2022-01-05 MED ORDER — SODIUM CHLORIDE 0.9 % IV SOLN
INTRAVENOUS | Status: DC | PRN
Start: 2022-01-05 — End: 2022-01-06

## 2022-01-05 MED ORDER — SODIUM CHLORIDE 0.9 % IV SOLN
250.0000 mL | INTRAVENOUS | Status: DC | PRN
  Administered 2022-01-08: 250 mL via INTRAVENOUS

## 2022-01-05 MED ORDER — RIVAROXABAN 20 MG PO TABS
20.0000 mg | ORAL_TABLET | Freq: Every day | ORAL | Status: DC
  Administered 2022-01-06 – 2022-01-09 (×4): 20 mg via ORAL
  Filled 2022-01-05 (×5): qty 1

## 2022-01-05 MED ORDER — ALBUTEROL SULFATE (2.5 MG/3ML) 0.083% IN NEBU
2.5000 mg | INHALATION_SOLUTION | RESPIRATORY_TRACT | Status: DC | PRN
Start: 2022-01-05 — End: 2022-01-10
  Administered 2022-01-06: 2.5 mg via RESPIRATORY_TRACT
  Filled 2022-01-05 (×3): qty 3

## 2022-01-05 MED ORDER — ALBUTEROL SULFATE HFA 108 (90 BASE) MCG/ACT IN AERS: 2.0000 | INHALATION_SPRAY | RESPIRATORY_TRACT | Status: DC | PRN

## 2022-01-05 MED ORDER — SODIUM CHLORIDE 0.9 % IV SOLN
500.0000 mg | INTRAVENOUS | Status: DC
  Administered 2022-01-06 – 2022-01-09 (×4): 500 mg via INTRAVENOUS
  Filled 2022-01-05 (×5): qty 5

## 2022-01-05 MED ORDER — SODIUM CHLORIDE 0.9 % IV SOLN
500.0000 mg | Freq: Once | INTRAVENOUS | Status: AC
  Administered 2022-01-05: 500 mg via INTRAVENOUS
  Filled 2022-01-05: qty 5

## 2022-01-05 MED ORDER — ATORVASTATIN CALCIUM 10 MG PO TABS
20.0000 mg | ORAL_TABLET | Freq: Every day | ORAL | Status: DC
  Administered 2022-01-05 – 2022-01-10 (×6): 20 mg via ORAL
  Filled 2022-01-05 (×6): qty 2

## 2022-01-05 MED ORDER — LACTATED RINGERS IV BOLUS
1000.0000 mL | Freq: Once | INTRAVENOUS | Status: AC
Start: 2022-01-05 — End: 2022-01-05
  Administered 2022-01-05: 1000 mL via INTRAVENOUS

## 2022-01-05 MED ORDER — SODIUM CHLORIDE 0.9 % IV SOLN
1.0000 g | Freq: Once | INTRAVENOUS | Status: AC
  Administered 2022-01-05: 1 g via INTRAVENOUS
  Filled 2022-01-05: qty 10

## 2022-01-05 MED ORDER — INSULIN ASPART 100 UNIT/ML IJ SOLN
0.0000 [IU] | Freq: Three times a day (TID) | INTRAMUSCULAR | Status: DC
  Administered 2022-01-06: 1 [IU] via SUBCUTANEOUS

## 2022-01-05 MED ORDER — SODIUM CHLORIDE 0.9 % IV SOLN
2.0000 g | INTRAVENOUS | Status: DC
  Administered 2022-01-06 – 2022-01-09 (×4): 2 g via INTRAVENOUS
  Filled 2022-01-05 (×5): qty 20

## 2022-01-05 MED ORDER — INSULIN ASPART 100 UNIT/ML IJ SOLN: 0.0000 [IU] | INTRAMUSCULAR | Status: DC

## 2022-01-05 MED ORDER — SODIUM CHLORIDE 0.9% FLUSH: 3.0000 mL | INTRAVENOUS | Status: DC | PRN

## 2022-01-05 MED ORDER — ACETAMINOPHEN 325 MG PO TABS
650.0000 mg | ORAL_TABLET | Freq: Four times a day (QID) | ORAL | Status: DC | PRN
  Filled 2022-01-05: qty 2

## 2022-01-05 NOTE — Assessment & Plan Note (Addendum)
Chest x-ray with cardiomegaly cannot rule out edema.  Check BNP set of troponins repeat echogram given cardiomegaly ?Clinically appear dry ?

## 2022-01-05 NOTE — Assessment & Plan Note (Signed)
Order urine electrolytes rehydrate ?

## 2022-01-05 NOTE — Assessment & Plan Note (Signed)
Continue Xarelto not on rate control medication ?

## 2022-01-05 NOTE — Assessment & Plan Note (Signed)
- 

## 2022-01-05 NOTE — Assessment & Plan Note (Signed)
Followed by oncology.  Given pancytopenia emailed oncology to see if needs further work-up. ?Noted AKI ?Appears to be dry.  No recent chemotherapy to suggest tumor lysis with can add on LDH and uric acid for completion ?

## 2022-01-05 NOTE — Assessment & Plan Note (Signed)
- -  Patient presenting with   Cough   Hypoxia and infiltrate  on chest x-ray ?-Infiltrate on CXR and 2-3 characteristics (fever, leukocytosis, purulent sputum) are consistent with pneumonia. ?-This appears to be most likely community-acquired pneumonia.  ?   will admit for treatment of CAP will start on appropriate antibiotic coverage. - Rocephin/azithromycin ?  Obtain:  sputum cultures, ?                Obtain respiratory panel  ?                  ?                 Pending but unlikely ?                 blood cultures and sputum cultures ordered  ?                 strep pneumo UA antigen,  ?                 check for Legionella antigen. ?               Provide oxygen as needed.  ? ? ?

## 2022-01-05 NOTE — Assessment & Plan Note (Signed)
Due to atrial fibrillation on Xarelto ?

## 2022-01-05 NOTE — Subjective & Objective (Signed)
Patient with known history of lymphoma followed by oncology if recurrent pneumonia and pneumothorax presents with increased shortness of breath for the past few weeks.  Also endorsing cough chills and fatigue congestion.  Increased wheezing.  On arrival noted to be satting 89% on room air started on 2 L ? Patient has history of A-fib on Eliquis ?

## 2022-01-05 NOTE — Assessment & Plan Note (Signed)
-   Order Sensitive  SSI     - Hold by mouth medications    

## 2022-01-05 NOTE — Assessment & Plan Note (Signed)
Chronic-stable.

## 2022-01-05 NOTE — Assessment & Plan Note (Addendum)
Check anemia panel, patient agreeable for transfusion as needed we will transfuse if symptomatic or if below 7.  Let oncology know if patient is being admitted given pancytopenia may need father work-up for bone marrow dysfunction ?

## 2022-01-05 NOTE — Assessment & Plan Note (Signed)
Chronic stable continue Lipitor ?

## 2022-01-05 NOTE — ED Provider Notes (Signed)
[facility]ENC[facility] DEPARTMENT Provider Note   CSN: [id number] Arrival date & time: 01/05/22  1701     History  Chief Complaint  Patient presents with   Shortness of Breath    James Sweeney is a 81 y.o. male with history of chronic A-fib anticoagulated on Eliquis and known right lower lobe pneumonia who presents today with concern for low oxygen saturation in the outpatient setting.  Patient was seen in urgent care 2 weeks ago and was prescribed Augmentin for right lower lobe pneumonia.  He did not complete that week course as he did not feel like finishing it.  He was seen today at urgent care [facility]) again for follow-up at which time he was prescribed Vantin but the outpatient pharmacy had it on backorder.  They were directed to the emergency department due to his oxygen saturation of 93% on room air at urgent care.  He endorses 3 weeks of progressively worsening cough and new dyspnea on exertion.  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  He is accompanied at the bedside by his wife James Sweeney and his grandson who provides collateral history as the patient is extremely hard of hearing.  In addition to the above listed complications has history of MALT lymphoma of the lung currently being observed with subsequent pneumothorax after biopsy in the past.  His history of BPH, diabetes, hypertension.  Has history of polio as a child. Endorses compliance with his daily medications.  Denies any chest pain.  HPI     Home Medications Prior to Admission medications   Medication Sig Start Date End Date Taking? Authorizing Provider  alfuzosin (UROXATRAL) 10 MG 24 hr tablet Take 10 mg by mouth at bedtime.     [provider]  amLODipine (NORVASC) 10 MG tablet Take 10 mg by mouth daily.    [provider]  amLODipine (NORVASC) 5 MG tablet  03/30/20   [provider]  Ascorbic Acid (SUPER C COMPLEX PO) Take 1 capsule by mouth every evening.  Super C 1000    [provider]  atorvastatin (LIPITOR) 20 MG tablet TAKE 1 TABLET BY MOUTH EVERY DAY 09/02/21   Croitoru, Mihai, MD  Calcium Carb-Cholecalciferol (CALCIUM 600+D3 PO) Take 1 tablet by mouth every evening.    [provider]  Cyanocobalamin (VITAMIN B-12) 5000 MCG SUBL Place 1 tablet under the tongue every evening.     [provider]  Ferrous Sulfate (IRON) 28 MG TABS Take 28 mg by mouth every evening.    [provider]  finasteride (PROSCAR) 5 MG tablet Take 5 mg by mouth at bedtime.  05/16/15   [provider]  hydrochlorothiazide (HYDRODIURIL) 12.5 MG tablet Take 12.5 mg by mouth daily.    [provider]  hydrocortisone (ANUSOL-HC) 25 MG suppository Place 1 suppository (25 mg total) rectally 2 (two) times daily. 05/08/20   Brunetta Genera, MD  loratadine (CLARITIN) 10 MG tablet Take 10 mg by mouth as needed. 10/04/21   [provider]  Omega-3 Fatty Acids (FISH OIL) 1200 MG CAPS Take 1,200 mg by mouth every evening.    [provider]  ondansetron (ZOFRAN) 8 MG tablet Take 1 tablet (8 mg total) by mouth every 8 (eight) hours as needed for nausea. 04/27/20   Brunetta Genera, MD  Polyethyl Glycol-Propyl Glycol 0.4-0.3 % SOLN Place 1 drop into both eyes 2 (two) times daily as needed (for dry/irritated eyes.).  [provider]  traMADol (ULTRAM) 50 MG tablet Take 1 tablet (50 mg total) by mouth every 6 (six) hours as needed (pain). Patient taking differently: Take 25 mg by mouth 2 (two) times daily. 04/23/18   [name], MD  triamcinolone ointment (KENALOG) 0.5 % APPLY TO ITCHY RASH ON EXTREMITIES AND WAIST TWICE A DAY FOR 15 DAYS. DO NOT APPLY TO FACE 06/06/21   Brunetta Genera, MD  Vitamin Mixture (SELENIUM-VITAMIN E) 50-400 MCG-UNIT CAPS Take 1 capsule by mouth every evening.    [provider]  XARELTO 20 MG TABS tablet TAKE 1 TABLET BY MOUTH DAILY WITH SUPPER. 08/16/21    Croitoru, Mihai, MD      Allergies    Losartan potassium-hctz, Levaquin [levofloxacin in d5w], and Penicillins    Review of Systems   Review of Systems  Constitutional:  Positive for activity change, chills and fatigue. Negative for appetite change and fever.  HENT:  Positive for congestion.   Eyes: Negative.   Respiratory:  Positive for cough and shortness of breath.   Gastrointestinal: Negative.   Genitourinary: Negative.   Musculoskeletal: Negative.   Neurological: Negative.    Physical Exam Updated Vital Signs BP (!) 165/55    Pulse 84    Temp 99.8 F (37.7 C) (Oral)    Resp 18    SpO2 97%  Physical Exam Vitals and nursing note reviewed.  Constitutional:      Appearance: He is ill-appearing. He is not toxic-appearing.  HENT:     Head: Normocephalic and atraumatic.     Nose: Nose normal.     Mouth/Throat:     Mouth: Mucous membranes are moist.     Pharynx: Oropharynx is clear. Uvula midline. No oropharyngeal exudate or posterior oropharyngeal erythema.  Eyes:     General: Lids are normal. Vision grossly intact.        Right eye: No discharge.        Left eye: No discharge.     Extraocular Movements: Extraocular movements intact.     Conjunctiva/sclera: Conjunctivae normal.     Pupils: Pupils are equal, round, and reactive to light.  Neck:     Trachea: Trachea and phonation normal.  Cardiovascular:     Rate and Rhythm: Normal rate. Rhythm irregularly irregular.     Pulses: Normal pulses.     Heart sounds: Normal heart sounds. No murmur heard.    Comments: Known afib Pulmonary:     Effort: Accessory muscle usage present. No tachypnea, bradypnea, prolonged expiration or respiratory distress.     Breath sounds: Examination of the right-lower field reveals rhonchi. Examination of the left-lower field reveals rhonchi. Rhonchi present. No wheezing or rales.     Comments: Hypoxic to 89% on RA with good pleth, started on 2L by  Chest:     Chest wall: No mass,  lacerations, deformity, swelling, tenderness, crepitus or edema.  Abdominal:     General: Bowel sounds are normal. There is no distension.     Palpations: Abdomen is soft.     Tenderness: There is no abdominal tenderness. There is no right CVA tenderness, left CVA tenderness, guarding or rebound.  Musculoskeletal:        General: No deformity.     Cervical back: Normal range of motion and neck supple.     Right lower leg: No edema.     Left lower leg: No edema.  Lymphadenopathy:     Cervical: No cervical adenopathy.  Skin:  General: Skin is warm and dry.     Capillary Refill: Capillary refill takes less than 2 seconds.  Neurological:     General: No focal deficit present.     Mental Status: He is alert and oriented to person, place, and time. Mental status is at baseline.     GCS: GCS eye subscore is 4. GCS verbal subscore is 5. GCS motor subscore is 6.     Gait: Gait is intact.  Psychiatric:        Mood and Affect: Mood normal.    ED Results / Procedures / Treatments   Labs (all labs ordered are listed, but only abnormal results are displayed) Labs Reviewed  BASIC METABOLIC PANEL - Abnormal; Notable for the following components:      Result Value   Sodium 128 (*)    Chloride 97 (*)    Glucose, Bld 164 (*)    BUN 26 (*)    Creatinine, Ser 1.52 (*)    Calcium 8.5 (*)    GFR, Estimated 46 (*)    All other components within normal limits  CBC - Abnormal; Notable for the following components:   WBC 1.6 (*)    RBC 2.29 (*)    Hemoglobin 8.8 (*)    HCT 26.3 (*)    MCV 114.8 (*)    MCH 38.4 (*)    All other components within normal limits  CULTURE, BLOOD (ROUTINE X 2)  CULTURE, BLOOD (ROUTINE X 2)  LACTIC ACID, PLASMA  LACTIC ACID, PLASMA    EKG EKG Interpretation  Date/Time:  Sunday January 05 2022 17:09:50 EST Ventricular Rate:  92 PR Interval:    QRS Duration: 94 QT Interval:  356 QTC Calculation: 440 R Axis:   88 Text Interpretation: Atrial fibrillation  Abnormal ECG When compared with ECG of 18-Sep-2019 08:50, unchanged since previous Confirmed by Wandra Arthurs [phone]) on 01/05/2022 5:19:47 PM  Radiology No results found.  Procedures Procedures    Medications Ordered in ED Medications  albuterol (VENTOLIN HFA) 108 (90 Base) MCG/ACT inhaler 2 puff (has no administration in time range)    ED Course/ Medical Decision Making/ A&P Clinical Course as of 01/05/22 1907  Nancy Fetter Jan 05, 2022  1815 Consult to pharmacist to confirm that patient has had both penicillins and cephalosporins in the past without difficulty.  Recent completed 7-day course of Augmentin for pneumonia.  Safe to proceed with Rocephin for CAP coverage. [RS]  1905 Consult to hospitalist, Dr. Roel Cluck, who is agreeable to seeing this patient and admitting him to her service.  I appreciate her collaboration in the care of this patient [RS]    Clinical Course User Index [RS] Elizabeth Paulsen, Gypsy Balsam, PA-C                           Medical Decision Making 88 -year-old male presents with concern for cough, shortness of breath, and borderline low oxygen of 93% at urgent care today.  Known right lower lobe pneumonia.  Hypertensive on intake, vital signs otherwise normal.  Oxygen saturation 92% on room air.  Cardiac exam with irregularly irregular rhythm with known A-fib.  Pulmonary exam with rhonchi in the bilateral lower lobes right greater than left, oxygen saturation dropped to 89% with good Pleth on room air while I was in the room.  He has been started on 2 L supplemental oxygen by nasal cannula with improvement in his saturations.  Differential diagnosis includes was limited  to PE, pleural effusion, pneumothorax, pneumonia, CHF.  Patient without known heart failure with LVEF of 60 to 65% in 2017.  Doubt PE as patient is compliant with Xarelto.  Amount and/or Complexity of Data Reviewed Labs: ordered.    Details: CBC with leukopenia near patient's baseline, anemia at hemoglobin of 8  near patient's baseline.  BMP with mild AKI with creatinine of 1.5, 1.3 at baseline.  Hyponatremia and hypochloremia as well. Radiology: ordered and independent interpretation performed.    Details: Chest x-ray with diffuse bilateral interstitial pulmonary opacities.  Images visualized by this provider.  Additionally there is focal airspace opacity in the right midlung consistent with previously seen masslike opacity which was biopsied in the past and consistent with mild lymphoma. ECG/medicine tests: ordered and independent interpretation performed.    Details: EKG with atrial fibrillation without RVR.  Risk Prescription drug management. Decision regarding hospitalization.   Given concern for pneumonia, CAP coverage has been ordered.  Additionally fluid bolus given mild AKI.  New for patient would benefit from mission to the hospital given new hypoxia today requiring 2 L supplemental oxygen by [facility]. Consult to hospitalist as above.  [facility] was understanding of his medical evaluation and treatment plan.  Each of his questions was answered to his expressed satisfaction.  He is amenable to plan for admission at this time.  This chart was dictated using voice recognition software, Dragon. Despite the best efforts of this provider to proofread and correct errors, errors may still occur which can change documentation meaning.   Final Clinical Impression(s) / ED Diagnoses Final diagnoses:  None    Rx / DC Orders ED Discharge Orders     None         Aura Dials 01/05/22 1912    Drenda Freeze, MD 01/06/22 1501

## 2022-01-05 NOTE — H&P (Signed)
James Sweeney LEX:[id number] DOB: 08-20-1941 DOA: 01/05/2022     PCP: Lois Huxley, PA   Outpatient Specialists:  CARDS:  Dr. Sallyanne Kuster    Oncology  Dr.Kale   Patient arrived to ER on 01/05/22 at 1701 Referred by Attending Drenda Freeze, MD   Patient coming from:    home Lives  With family    Chief Complaint:   Chief Complaint  Patient presents with   Shortness of Breath      HPI: James Sweeney is a 81 y.o. male with medical history significant of    longstanding persistent atrial fibrillation, MALT lymphoma, DM2, BPH, HLD, HTN, reccurent PNA  Presented with   cough chills shortness of breath Patient with known history of lymphoma followed by oncology if recurrent pneumonia and pneumothorax presents with increased shortness of breath for the past few weeks.  Also endorsing cough chills and fatigue congestion.  Increased wheezing.  On arrival noted to be satting 89% on room air started on 2 L  Patient has history of A-fib on Eliquis  Was seen for PNA few wks ago at Urgent care prescribed Augmentin but he did not finish Came back to Urgent care and hypoxic brought to ER Has been taking his Xarelto  History of lung mass turned out to be multiple lymphoma undergone wedge resection in 2019 No CP This AM tem 100.4   Initial COVID TEST  NEGATIVE   Lab Results  Component Value Date   [place] NEGATIVE 01/05/2022   [place] NOT DETECTED 08/12/2019     Regarding pertinent Chronic problems:    Hyperlipidemia - on statins Lipitor Lipid Panel  No results found for: CHOL, TRIG, HDL, CHOLHDL, VLDL, LDLCALC, LDLDIRECT, LABVLDL  HTN on Norvasc HCTZ   chronic CHF diastolic - last echo 4496 diastolic chf  MALT  Extranodal Marginal Zone MALT Lymphoma.  in June 2016 which showed this mass. A navigation bronchoscopy EBUS was performed in March 2017 that did not reveal a definitive pathology. A repeat bx (VATS  With wedge resection of  RML) biopsy was completed on  04/12/18 which revealed an Extranodal marginal zone lymphoma. He denies any constitutional symptoms. The pt reports that he still has some soreness at his surgical site but denies any difficulty breathing.  Followed by Dr. Irene Limbo    DM 2 -  Lab Results  Component Value Date   HGBA1C 6.3 (H) 04/08/2018   diet controlled       A. Fib -  - CHA2DS2 vas score    5       current  on anticoagulation with Xarelto      CKD stage IIIa- baseline Cr 1.2-.14 CrCl cannot be calculated (Unknown ideal weight.).  Lab Results  Component Value Date   CREATININE 1.52 (H) 01/05/2022   CREATININE 1.12 09/02/2021   CREATININE 1.37 (H) 01/22/2021    Chronic anemia - baseline hg Hemoglobin & Hematocrit  Recent Labs    01/22/21 1054 09/02/21 1359 01/05/22 1715  HGB 13.3 9.8* 8.8*     While in ER: Clinical Course as of 01/05/22 1921  Nancy Fetter Jan 05, 2022  1815 Consult to pharmacist to confirm that patient has had both penicillins and cephalosporins in the past without difficulty.  Recent completed 7-day course of Augmentin for pneumonia.  Safe to proceed with Rocephin for CAP coverage. [RS]  1905 Consult to hospitalist, Dr. Roel Cluck, who is agreeable to seeing this patient and admitting him to her service.  I appreciate  her collaboration in the care of this patient [RS]    Clinical Course User Index [RS] Sponseller, Rebekah R, PA-C     Ordered    CXR -cardiomegaly with diffuse bilateral interstitial pulmonary opacity consistent with edema but there is also focal opacity of the right midlung consistent with prior masslike     Following Medications were ordered in ER: Medications  albuterol (VENTOLIN HFA) 108 (90 Base) MCG/ACT inhaler 2 puff (has no administration in time range)  lactated ringers bolus 1,000 mL (has no administration in time range)  cefTRIAXone (ROCEPHIN) 1 g in sodium chloride 0.9 % 100 mL IVPB (has no administration in time range)  azithromycin (ZITHROMAX) 500 mg in sodium chloride  0.9 % 250 mL IVPB (has no administration in time range)    _______________________________________________________    ED Triage Vitals  Enc Vitals Group     BP 01/05/22 1705 (!) 171/66     Pulse Rate 01/05/22 1705 79     Resp 01/05/22 1705 20     Temp 01/05/22 1705 99.8 F (37.7 C)     Temp Source 01/05/22 1705 Oral     SpO2 01/05/22 1705 92 %     Weight --      Height --      Head Circumference --      Peak Flow --      Pain Score 01/05/22 1712 2     Pain Loc --      Pain Edu? --      Excl. in [place]? --   TMAX(24)@     _________________________________________ Significant initial  Findings: Abnormal Labs Reviewed  BASIC METABOLIC PANEL - Abnormal; Notable for the following components:      Result Value   Sodium 128 (*)    Chloride 97 (*)    Glucose, Bld 164 (*)    BUN 26 (*)    Creatinine, Ser 1.52 (*)    Calcium 8.5 (*)    GFR, Estimated 46 (*)    All other components within normal limits  CBC - Abnormal; Notable for the following components:   WBC 1.6 (*)    RBC 2.29 (*)    Hemoglobin 8.8 (*)    HCT 26.3 (*)    MCV 114.8 (*)    MCH 38.4 (*)    Platelets 112 (*)    All other components within normal limits     _________________________ Troponin  19  ECG: Ordered Personally reviewed by me showing: HR : 92 Rhythm:  A.fib.     no evidence of ischemic changes QTC 460   The recent clinical data is shown below. Vitals:   01/05/22 1705 01/05/22 1745 01/05/22 1830 01/05/22 1845  BP: (!) 171/66 (!) 165/55 140/61 (!) 146/67  Pulse: 79 84 82 (!) 102  Resp: '20 18  18  '$ Temp: 99.8 F (37.7 C)     TempSrc: Oral     SpO2: 92% 97% 97% 91%     WBC     Component Value Date/Time   WBC 1.6 (L) 01/05/2022 1715   LYMPHSABS 0.3 (L) 09/02/2021 1359   MONOABS 0.1 09/02/2021 1359   EOSABS 0.1 09/02/2021 1359   BASOSABS 0.0 09/02/2021 1359      Procalcitonin  Ordered      Results for orders placed or performed during the hospital encounter of 01/05/22  Resp  Panel by RT-PCR (Flu A&B, Covid) Nasopharyngeal Swab     Status: None   Collection Time: 01/05/22  6:55 PM  Specimen: Nasopharyngeal Swab; Nasopharyngeal(NP) swabs in vial transport medium  Result Value Ref Range Status   SARS Coronavirus 2 by RT PCR NEGATIVE NEGATIVE Final         Influenza A by PCR NEGATIVE NEGATIVE Final   Influenza B by PCR NEGATIVE NEGATIVE Final           _______________________________________________ Hospitalist was called for admission for CAP acute respiratory failure with hypoxia  The following Work up has been ordered so far:  Orders Placed This Encounter  Procedures   Blood culture (routine x 2)   Resp Panel by RT-PCR (Flu A&B, Covid) Nasopharyngeal Swab   DG Chest Port 1 View   Basic metabolic panel   CBC   Lactic acid, plasma   Protime-INR   Brain natriuretic peptide   Cardiac monitoring   Utilize spacer/aerochamber with mdi inhaler for COVID-19 positive patients or PUI for COVID-19   If O2 Sat <94% administer O2 at 2 liters/minute via nasal cannula   Document Height and Actual Weight   Consult to hospitalist   Pulse oximetry, continuous   Adult wheeze protocol - initiate by RT   EKG 12-Lead   ED EKG     OTHER Significant initial  Findings:  labs showing:    Recent Labs  Lab 01/05/22 1715  NA 128*  K 3.9  CO2 22  GLUCOSE 164*  BUN 26*  CREATININE 1.52*  CALCIUM 8.5*  MG 1.9  PHOS 3.1    Cr    Up from baseline see below Lab Results  Component Value Date   CREATININE 1.52 (H) 01/05/2022   CREATININE 1.12 09/02/2021   CREATININE 1.37 (H) 01/22/2021    Recent Labs  Lab 01/05/22 1715  AST 55*  ALT 52*  ALKPHOS 78  BILITOT 1.4*  PROT 6.6  ALBUMIN 3.7   Lab Results  Component Value Date   CALCIUM 8.5 (L) 01/05/2022   PHOS 3.1 01/05/2022        Plt: Lab Results  Component Value Date   PLT 112 (L) 01/05/2022      COVID-19 Labs  No results for input(s): DDIMER, FERRITIN, LDH, CRP in the last 72  hours.  Lab Results  Component Value Date   SARSCOV2NAA NOT DETECTED 08/12/2019    Venous  Blood Gas ordered  ABG    Component Value Date/Time   PHART 7.402 04/13/2018 0535   PCO2ART 40.6 04/13/2018 0535   PO2ART 65.0 (L) 04/13/2018 0535   HCO3 25.4 04/13/2018 0535   TCO2 27 04/13/2018 0535   O2SAT 93.0 04/13/2018 0535       Recent Labs  Lab 01/05/22 1715  WBC 1.6*  HGB 8.8*  HCT 26.3*  MCV 114.8*  PLT 112*    HG/HCT  stable,       Component Value Date/Time   HGB 8.8 (L) 01/05/2022 1715   HGB 12.9 (L) 04/11/2020 0856   HCT 26.3 (L) 01/05/2022 1715   MCV 114.8 (H) 01/05/2022 1715     Cardiac Panel (last 3 results) Recent Labs    01/05/22 1715  CKTOTAL 142     BNP (last 3 results) Recent Labs    01/05/22 1715  BNP 216.0*      Cultures: No results found for: SDES, SPECREQUEST, CULT, REPTSTATUS   Radiological Exams on Admission: DG Chest Port 1 View  Result Date: 01/05/2022 CLINICAL DATA:  Shortness of breath, hypoxia EXAM: PORTABLE CHEST 1 VIEW COMPARISON:  Chest radiographs, 01/03/2022, CT chest abdomen pelvis, 09/16/2021 FINDINGS: Cardiomegaly.  Diffuse bilateral interstitial pulmonary opacity, increased compared to prior. Relatively focal airspace opacity of the right midlung, similar to prior. IMPRESSION: 1. Cardiomegaly with diffuse bilateral interstitial pulmonary opacity, increased compared to prior and most consistent with edema. 2. Relatively focal airspace opacity of the right mid lung, which corresponds to a subpleural masslike opacity seen on prior CT. Electronically Signed   By: Delanna Ahmadi M.D.   On: 01/05/2022 18:11   _______________________________________________________________________________________________________ Latest  Blood pressure (!) 146/67, pulse (!) 102, temperature 99.8 F (37.7 C), temperature source Oral, resp. rate 18, SpO2 91 %.   Vitals  labs and radiology finding personally reviewed  Review of Systems:    Pertinent  positives include:  Fevers, chills, fatigue, shortness of breath at rest.  dyspnea on exertion,  productive cough,  Constitutional:  No weight loss, night sweats,  weight loss  HEENT:  No headaches, Difficulty swallowing,Tooth/dental problems,Sore throat,  No sneezing, itching, ear ache, nasal congestion, post nasal drip,  Cardio-vascular:  No chest pain, Orthopnea, PND, anasarca, dizziness, palpitations.no Bilateral lower extremity swelling  GI:  No heartburn, indigestion, abdominal pain, nausea, vomiting, diarrhea, change in bowel habits, loss of appetite, melena, blood in stool, hematemesis Resp:  no No excess mucus, noNo non-productive cough, No coughing up of blood.No change in color of mucus.No wheezing. Skin:  no rash or lesions. No jaundice GU:  no dysuria, change in color of urine, no urgency or frequency. No straining to urinate.  No flank pain.  Musculoskeletal:  No joint pain or no joint swelling. No decreased range of motion. No back pain.  Psych:  No change in mood or affect. No depression or anxiety. No memory loss.  Neuro: no localizing neurological complaints, no tingling, no weakness, no double vision, no gait abnormality, no slurred speech, no confusion  All systems reviewed and apart from [place] all are negative _______________________________________________________________________________________________ Past Medical History:   Past Medical History:  Diagnosis Date   Cervical spondylosis without myelopathy 12/01/2013   Diabetes mellitus    borderline   Enlarged prostate    Frequency of urination    Hearing deficit    Heart murmur    History of kidney stones    removed with Lithotripsy   HOH (hard of hearing)    right ear   Hyperlipemia    Hypertension    Lumbago 12/01/2013   Pneumonia 2016   [place]    right leg smaller than left   Shortness of breath dyspnea    with exertion     Past Surgical History:  Procedure Laterality Date   CATARACT  EXTRACTION Bilateral 2014   LUNG BIOPSY Right 2018   March   VIDEO ASSISTED THORACOSCOPY (VATS)/WEDGE RESECTION Right 04/12/2018   Procedure: VIDEO ASSISTED THORACOSCOPY (VATS), WEDGE RESECTION RIGHT MIDDLE LOBE, PLACEMENT OF ON Q CATHETER;  Surgeon: [name], MD;  Location: [facility];  Service: Thoracic;  Laterality: Right;   VIDEO BRONCHOSCOPY N/A 04/12/2018   Procedure: VIDEO BRONCHOSCOPY;  Surgeon: [name], MD;  Location: [facility];  Service: Thoracic;  Laterality: N/A;   VIDEO BRONCHOSCOPY WITH ENDOBRONCHIAL NAVIGATION N/A 01/25/2016   Procedure: VIDEO BRONCHOSCOPY WITH ENDOBRONCHIAL NAVIGATION;  Surgeon: [name], MD;  Location: [facility];  Service: Thoracic;  Laterality: N/A;   VIDEO BRONCHOSCOPY WITH ENDOBRONCHIAL ULTRASOUND N/A 01/25/2016   Procedure: VIDEO BRONCHOSCOPY WITH ENDOBRONCHIAL ULTRASOUND;  Surgeon: [name], MD;  Location: [facility];  Service: Thoracic;  Laterality: N/A;    Social History:  Ambulatory  independently       reports that he quit smoking about 54 years ago. His smoking use included cigarettes. He has a 4.50 pack-year smoking history. He quit smokeless tobacco use about 23 years ago.  His smokeless tobacco use included chew. He reports that he does not drink alcohol and does not use drugs.     Family History:   Family History  Problem Relation Age of Onset   Cancer Mother        Stomach   Cancer - Lung Father    Heart attack Brother    Diabetes Brother    ______________________________________________________________________________________________ Allergies: Allergies  Allergen Reactions   Losartan Potassium-Hctz     Other reaction(s): nausea   Levaquin [Levofloxacin In D5w] Nausea Only   Penicillins Hives and Other (See Comments)    Has patient had a PCN reaction causing immediate rash, facial/tongue/throat swelling, SOB or lightheadedness with hypotension: unknown, childhood allergy Has patient had a PCN reaction causing  severe rash involving mucus membranes or skin necrosis: No Has patient had a PCN reaction that required hospitalization No Has patient had a PCN reaction occurring within the last 10 years: No If all of the above answers are "NO", then may proceed with Cephalosporin use.      Prior to Admission medications   Medication Sig Start Date End Date Taking? Authorizing Provider  alfuzosin (UROXATRAL) 10 MG 24 hr tablet Take 10 mg by mouth at bedtime.     [provider]  amLODipine (NORVASC) 10 MG tablet Take 10 mg by mouth daily.    [provider]  amLODipine (NORVASC) 5 MG tablet  03/30/20   [provider]  Ascorbic Acid (SUPER C COMPLEX PO) Take 1 capsule by mouth every evening. Super C 1000    [provider]  atorvastatin (LIPITOR) 20 MG tablet TAKE 1 TABLET BY MOUTH EVERY DAY 09/02/21   Croitoru, Mihai, MD  Calcium Carb-Cholecalciferol (CALCIUM 600+D3 PO) Take 1 tablet by mouth every evening.    [provider]  Cyanocobalamin (VITAMIN B-12) 5000 MCG SUBL Place 1 tablet under the tongue every evening.     [provider]  Ferrous Sulfate (IRON) 28 MG TABS Take 28 mg by mouth every evening.    [provider]  finasteride (PROSCAR) 5 MG tablet Take 5 mg by mouth at bedtime.  05/16/15   [provider]  hydrochlorothiazide (HYDRODIURIL) 12.5 MG tablet Take 12.5 mg by mouth daily.    [provider]  hydrocortisone (ANUSOL-HC) 25 MG suppository Place 1 suppository (25 mg total) rectally 2 (two) times daily. 05/08/20   Brunetta Genera, MD  loratadine (CLARITIN) 10 MG tablet Take 10 mg by mouth as needed. 10/04/21   [provider]  Omega-3 Fatty Acids (FISH OIL) 1200 MG CAPS Take 1,200 mg by mouth every evening.    [provider]  ondansetron (ZOFRAN) 8 MG tablet Take 1 tablet (8 mg total) by mouth every 8 (eight) hours as needed for nausea. 04/27/20   Brunetta Genera, MD  Polyethyl  Glycol-Propyl Glycol 0.4-0.3 % SOLN Place 1 drop into both eyes 2 (two) times daily as needed (for dry/irritated eyes.).    [provider]  traMADol (ULTRAM) 50 MG tablet Take 1 tablet (50 mg total) by mouth every 6 (six) hours as needed (pain). Patient taking differently: Take 25 mg by mouth 2 (two) times daily. 04/23/18   [name], MD  triamcinolone ointment (KENALOG) 0.5 % APPLY TO ITCHY  RASH ON EXTREMITIES AND WAIST TWICE A DAY FOR 15 DAYS. DO NOT APPLY TO FACE 06/06/21   Brunetta Genera, MD  Vitamin Mixture (SELENIUM-VITAMIN E) 50-400 MCG-UNIT CAPS Take 1 capsule by mouth every evening.    [provider]  XARELTO 20 MG TABS tablet TAKE 1 TABLET BY MOUTH DAILY WITH SUPPER. 08/16/21   Croitoru, Dani Gobble, MD    ___________________________________________________________________________________________________ Physical Exam: Vitals with BMI 01/05/2022 01/05/2022 01/05/2022  Height - - -  Weight - - -  BMI - - -  Systolic 097 353 299  Diastolic 67 61 55  Pulse 242 82 84     1. General:  in No  Acute distress   Chronically ill   -appearing 2. Psychological: Alert and   Oriented 3. Head/ENT:    Dry Mucous Membranes                          Head Non traumatic, neck supple                          Poor Dentition 4. SKIN:  decreased Skin turgor,  Skin clean Dry and intact no rash 5. Heart: Regular rate and rhythm no  Murmur, no Rub or gallop 6. Lungs:   no wheezes or crackles  decreased breath sounds 7. Abdomen: Soft,  non-tender, Non distended   bowel sounds present 8. Lower extremities: no clubbing, cyanosis, no  edema 9. Neurologically Grossly intact, moving all 4 extremities equally   10. MSK: Normal range of motion    Chart has been reviewed  ______________________________________________________________________________________________  Assessment/Plan   80 y.o. male with medical history significant of    longstanding persistent atrial fibrillation,  MALT lymphoma, DM2, BPH, HLD, HTN, reccurent PNA Admitted for  CAP and acute respiratory failure  Present on Admission:  Essential hypertension  Permanent atrial fibrillation ([place])  CAP (community acquired pneumonia)  Thrombocytopenia ([place])  Anemia  AKI (acute kidney injury) ([place])  Hyponatremia  Chronic diastolic CHF (congestive heart failure) ([place])  Cardiomegaly  Acute respiratory failure with hypoxia ([place])  Hypercholesterolemia  Acquired thrombophilia ([place])     AKI (acute kidney injury) ([place]) Order urine electrolytes rehydrate  Hyponatremia Likely secondary to dehydration/pneumonia rehydrate order urine electrolytes Hold hydrochlorothiazide and other medications that could affect sodium  Thrombocytopenia (HCC) Chronic stable  Anemia Check anemia panel, patient agreeable for transfusion as needed we will transfuse if symptomatic or if below 7.  Let oncology know if patient is being admitted given pancytopenia may need father work-up for bone marrow dysfunction  Cardiomegaly repeat echo  CAP (community acquired pneumonia)  - -Patient presenting with   Cough   Hypoxia and infiltrate  on chest x-ray -Infiltrate on CXR and 2-3 characteristics (fever, leukocytosis, purulent sputum) are consistent with pneumonia. -This appears to be most likely community-acquired pneumonia.     will admit for treatment of CAP will start on appropriate antibiotic coverage. - Rocephin/azithromycin   Obtain:  sputum cultures,                 Obtain respiratory panel                                     Pending but unlikely                  blood cultures and sputum cultures  ordered                   strep pneumo UA antigen,                   check for Legionella antigen.                Provide oxygen as needed.     DM (diabetes mellitus), type 2 ([place])  - Order Sensitive SSI       - Hold by mouth medications     Acute respiratory failure with hypoxia (HCC)  this patient has acute  respiratory failure with Hypoxia  as documented by the presence of following: O2 saturatio< 90% on RA Likely due to: Pneumonia, vs CHF exacerbation,  Provide O2 therapy and titrate as needed  Continuous pulse ox   check Pulse ox with ambulation prior to discharge   may need  TC consult for home O2 set up    Hypercholesterolemia Chronic stable continue Lipitor  Permanent atrial fibrillation (HCC) Continue Xarelto not on rate control medication  Chronic diastolic CHF (congestive heart failure) ([place]) Chest x-ray with cardiomegaly cannot rule out edema.  Check BNP set of troponins repeat echogram given cardiomegaly Clinically appear dry  Acquired thrombophilia (HCC) Due to atrial fibrillation on Xarelto  Marginal zone lymphoma ([place]) Followed by oncology.  Given pancytopenia emailed oncology to see if needs further work-up. Noted AKI Appears to be dry.  No recent chemotherapy to suggest tumor lysis with can add on LDH and uric acid for completion   Other plan as per orders.  DVT prophylaxis:  xarelto    Code Status:    Code Status: Prior FULL CODE  as per patient   I had personally discussed CODE STATUS with patient and family    Family Communication:   Family  at  Bedside  plan of care was discussed GrandSon,    Disposition Plan:    To home once workup is complete and patient is stable   Following barriers for discharge:                            Electrolytes corrected                               Anemia stable                                                    able to transition to PO antibiotics                             Will need to be able to tolerate PO                            Will likely need home health, home O2, set up                                           Nutrition    consulted  Consults called:    email onlocology that pt has been admitted  Admission status:  ED Disposition     ED Disposition  [place]: [place] [[id number]]  Level of Care: Telemetry Cardiac [[id number]]  May admit patient to [facility] or [facility] if equivalent level of care is available:: No  Covid Evaluation: Asymptomatic Screening Protocol (No Symptoms)  Diagnosis: CAP (community acquired pneumonia) [948016]  Admitting Physician: Toy Baker [[id number]]  Attending Physician: Toy Baker [[id number]]  Estimated length of stay: past midnight tomorrow  Certification:: I certify this patient will need inpatient services for at least 2 midnights             inpatient     I Expect 2 midnight stay secondary to severity of patient's current illness need for inpatient interventions justified by the following:  hemodynamic instability despite optimal treatment ( hypoxia ) * Severe lab/radiological/exam abnormalities including:     and extensive comorbidities including:    DM2   CHF  malignancy,   Chronic anticoagulation  That are currently affecting medical management.   I expect  patient to be hospitalized for 2 midnights requiring inpatient medical care.  Patient is at high risk for adverse outcome (such as loss of life or disability) if not treated.  Indication for inpatient stay as follows:    New or worsening hypoxia   Need for IV antibiotics, I     Level of care     tele  For  24H        Lab Results  Component Value Date   [place] NEGATIVE 01/05/2022     Precautions: admitted as  Covid Negative  No active isolations    Kassidy Frankson 01/05/2022, 8:42 PM    [facility]     after 2 AM please page floor coverage PA If 7AM-7PM, please contact the day team taking care of the patient using [email or web address]   Patient was evaluated in the context of the global COVID-19 pandemic, which necessitated consideration that the patient might be at risk for infection with the SARS-CoV-2 virus that causes COVID-19. Institutional protocols and  algorithms that pertain to the evaluation of patients at risk for COVID-19 are in a state of rapid change based on information released by regulatory bodies including the CDC and federal and state organizations. These policies and algorithms were followed during the patient's care.

## 2022-01-05 NOTE — ED Triage Notes (Signed)
Pt c/o SHOB x3 wks, states it was after "doctor poked a hole in my lung." Hx pneumothorax, afib, pneumonia. O2 low 90's in triage ? ?Pt EXTREMELY HOH ?

## 2022-01-05 NOTE — Assessment & Plan Note (Signed)
this patient has acute respiratory failure with Hypoxia  as documented by the presence of following: ?O2 saturatio< 90% on RA Likely due to: Pneumonia, vs CHF exacerbation,  ?Provide O2 therapy and titrate as needed ? Continuous pulse ox ?  check Pulse ox with ambulation prior to discharge ?  may need  TC consult for home O2 set up ?  ?

## 2022-01-05 NOTE — Assessment & Plan Note (Addendum)
Likely secondary to dehydration/pneumonia rehydrate order urine electrolytes ?Hold hydrochlorothiazide and other medications that could affect sodium ?

## 2022-01-06 ENCOUNTER — Inpatient Hospital Stay (HOSPITAL_COMMUNITY): Payer: PPO

## 2022-01-06 DIAGNOSIS — I517 Cardiomegaly: Secondary | ICD-10-CM | POA: Diagnosis not present

## 2022-01-06 DIAGNOSIS — J189 Pneumonia, unspecified organism: Secondary | ICD-10-CM | POA: Diagnosis not present

## 2022-01-06 DIAGNOSIS — N179 Acute kidney failure, unspecified: Secondary | ICD-10-CM | POA: Diagnosis not present

## 2022-01-06 DIAGNOSIS — D61818 Other pancytopenia: Secondary | ICD-10-CM

## 2022-01-06 DIAGNOSIS — I5032 Chronic diastolic (congestive) heart failure: Secondary | ICD-10-CM | POA: Diagnosis not present

## 2022-01-06 DIAGNOSIS — J9601 Acute respiratory failure with hypoxia: Secondary | ICD-10-CM | POA: Diagnosis not present

## 2022-01-06 LAB — BASIC METABOLIC PANEL
Anion gap: 9 (ref 5–15)
BUN: 25 mg/dL — ABNORMAL HIGH (ref 8–23)
CO2: 23 mmol/L (ref 22–32)
Calcium: 8.3 mg/dL — ABNORMAL LOW (ref 8.9–10.3)
Chloride: 97 mmol/L — ABNORMAL LOW (ref 98–111)
Creatinine, Ser: 1.41 mg/dL — ABNORMAL HIGH (ref 0.61–1.24)
GFR, Estimated: 50 mL/min — ABNORMAL LOW (ref >60)
Glucose, Bld: 113 mg/dL — ABNORMAL HIGH (ref 70–99)
Potassium: 3.7 mmol/L (ref 3.5–5.1)
Sodium: 129 mmol/L — ABNORMAL LOW (ref 135–145)

## 2022-01-06 LAB — CBC WITH DIFFERENTIAL/PLATELET
Abs Immature Granulocytes: 0 10*3/uL (ref 0.00–0.07)
Basophils Absolute: 0 10*3/uL (ref 0.0–0.1)
Basophils Relative: 0 %
Eosinophils Absolute: 0 10*3/uL (ref 0.0–0.5)
Eosinophils Relative: 1 %
HCT: 24.2 % — ABNORMAL LOW (ref 39.0–52.0)
Hemoglobin: 8.2 g/dL — ABNORMAL LOW (ref 13.0–17.0)
Immature Granulocytes: 0 %
Lymphocytes Relative: 18 %
Lymphs Abs: 0.2 10*3/uL — ABNORMAL LOW (ref 0.7–4.0)
MCH: 37.8 pg — ABNORMAL HIGH (ref 26.0–34.0)
MCHC: 33.9 g/dL (ref 30.0–36.0)
MCV: 111.5 fL — ABNORMAL HIGH (ref 80.0–100.0)
Monocytes Absolute: 0.1 10*3/uL (ref 0.1–1.0)
Monocytes Relative: 4 %
Neutro Abs: 1 10*3/uL — ABNORMAL LOW (ref 1.7–7.7)
Neutrophils Relative %: 77 %
Platelets: 95 10*3/uL — ABNORMAL LOW (ref 150–400)
RBC: 2.17 MIL/uL — ABNORMAL LOW (ref 4.22–5.81)
RDW: 14.7 % (ref 11.5–15.5)
Smear Review: NORMAL
WBC: 1.3 10*3/uL — CL (ref 4.0–10.5)
nRBC: 0 % (ref 0.0–0.2)

## 2022-01-06 LAB — RESPIRATORY PANEL BY PCR

## 2022-01-06 LAB — COMPREHENSIVE METABOLIC PANEL
ALT: 49 U/L — ABNORMAL HIGH (ref 0–44)
AST: 47 U/L — ABNORMAL HIGH (ref 15–41)
Albumin: 3.2 g/dL — ABNORMAL LOW (ref 3.5–5.0)
Alkaline Phosphatase: 69 U/L (ref 38–126)
Anion gap: 7 (ref 5–15)
BUN: 23 mg/dL (ref 8–23)
CO2: 23 mmol/L (ref 22–32)
Calcium: 8.1 mg/dL — ABNORMAL LOW (ref 8.9–10.3)
Chloride: 101 mmol/L (ref 98–111)
Creatinine, Ser: 1.27 mg/dL — ABNORMAL HIGH (ref 0.61–1.24)
GFR, Estimated: 57 mL/min — ABNORMAL LOW (ref >60)
Glucose, Bld: 115 mg/dL — ABNORMAL HIGH (ref 70–99)
Potassium: 4 mmol/L (ref 3.5–5.1)
Sodium: 131 mmol/L — ABNORMAL LOW (ref 135–145)
Total Bilirubin: 1.1 mg/dL (ref 0.3–1.2)
Total Protein: 5.8 g/dL — ABNORMAL LOW (ref 6.5–8.1)

## 2022-01-06 LAB — URINALYSIS, COMPLETE (UACMP) WITH MICROSCOPIC
Bilirubin Urine: NEGATIVE
Glucose, UA: NEGATIVE mg/dL
Hgb urine dipstick: NEGATIVE
Ketones, ur: NEGATIVE mg/dL
Leukocytes,Ua: NEGATIVE
Nitrite: NEGATIVE
Protein, ur: 30 mg/dL — AB
Specific Gravity, Urine: 1.015 (ref 1.005–1.030)
pH: 5 (ref 5.0–8.0)

## 2022-01-06 LAB — MAGNESIUM: Magnesium: 1.9 mg/dL (ref 1.7–2.4)

## 2022-01-06 LAB — PHOSPHORUS: Phosphorus: 3.5 mg/dL (ref 2.5–4.6)

## 2022-01-06 LAB — STREP PNEUMONIAE URINARY ANTIGEN: Strep Pneumo Urinary Antigen: NEGATIVE

## 2022-01-06 LAB — ECHOCARDIOGRAM COMPLETE
AR max vel: 2.12 cm2
AV Area VTI: 1.89 cm2
AV Area mean vel: 2 cm2
AV Mean grad: 8 mmHg
AV Peak grad: 13.5 mmHg
Ao pk vel: 1.84 m/s
Area-P 1/2: 4.21 cm2
Height: 70 in
S' Lateral: 3.75 cm
Weight: 2932.8 oz

## 2022-01-06 LAB — GLUCOSE, CAPILLARY
Glucose-Capillary: 125 mg/dL — ABNORMAL HIGH (ref 70–99)
Glucose-Capillary: 105 mg/dL — ABNORMAL HIGH (ref 70–99)
Glucose-Capillary: 113 mg/dL — ABNORMAL HIGH (ref 70–99)

## 2022-01-06 LAB — OSMOLALITY, URINE: Osmolality, Ur: 528 mOsm/kg (ref 300–900)

## 2022-01-06 LAB — LACTATE DEHYDROGENASE: LDH: 193 U/L — ABNORMAL HIGH (ref 98–192)

## 2022-01-06 LAB — EXPECTORATED SPUTUM ASSESSMENT W GRAM STAIN, RFLX TO RESP C

## 2022-01-06 LAB — TROPONIN I (HIGH SENSITIVITY)
Troponin I (High Sensitivity): 23 ng/L — ABNORMAL HIGH (ref <18)
Troponin I (High Sensitivity): 25 ng/L — ABNORMAL HIGH (ref <18)

## 2022-01-06 LAB — SODIUM, URINE, RANDOM: Sodium, Ur: 31 mmol/L

## 2022-01-06 LAB — FOLATE: Folate: 63.9 ng/mL (ref >5.9)

## 2022-01-06 LAB — TSH: TSH: 1.255 u[IU]/mL (ref 0.350–4.500)

## 2022-01-06 LAB — CREATININE, URINE, RANDOM: Creatinine, Urine: 131.96 mg/dL

## 2022-01-06 LAB — URIC ACID: Uric Acid, Serum: 5.5 mg/dL (ref 3.7–8.6)

## 2022-01-06 LAB — PROCALCITONIN
Procalcitonin: 0.17 ng/mL
Procalcitonin: 0.15 ng/mL

## 2022-01-06 LAB — SAVE SMEAR(SSMR), FOR PROVIDER SLIDE REVIEW

## 2022-01-06 MED ORDER — SODIUM CHLORIDE 0.9 % IV SOLN: INTRAVENOUS | Status: DC

## 2022-01-06 MED ORDER — SALINE SPRAY 0.65 % NA SOLN
1.0000 | NASAL | Status: DC | PRN
  Administered 2022-01-06 – 2022-01-09 (×2): 1 via NASAL
  Filled 2022-01-06: qty 44

## 2022-01-06 MED ORDER — GUAIFENESIN ER 600 MG PO TB12
1200.0000 mg | ORAL_TABLET | Freq: Two times a day (BID) | ORAL | Status: DC
  Administered 2022-01-06: 1200 mg via ORAL
  Filled 2022-01-06 (×2): qty 2

## 2022-01-06 MED ORDER — LEVALBUTEROL HCL 0.63 MG/3ML IN NEBU
0.6300 mg | INHALATION_SOLUTION | Freq: Three times a day (TID) | RESPIRATORY_TRACT | Status: DC
  Administered 2022-01-06 – 2022-01-07 (×5): 0.63 mg via RESPIRATORY_TRACT
  Filled 2022-01-06 (×6): qty 3

## 2022-01-06 MED ORDER — LORATADINE 10 MG PO TABS
10.0000 mg | ORAL_TABLET | Freq: Every day | ORAL | Status: DC
  Administered 2022-01-06 – 2022-01-10 (×5): 10 mg via ORAL
  Filled 2022-01-06 (×5): qty 1

## 2022-01-06 MED ORDER — PHENOL 1.4 % MT LIQD
1.0000 | OROMUCOSAL | Status: DC | PRN
  Filled 2022-01-06: qty 177

## 2022-01-06 MED ORDER — GUAIFENESIN-DM 100-10 MG/5ML PO SYRP
15.0000 mL | ORAL_SOLUTION | ORAL | Status: DC | PRN
  Administered 2022-01-06 – 2022-01-10 (×9): 15 mL via ORAL
  Filled 2022-01-06 (×9): qty 15

## 2022-01-06 MED ORDER — METHYLPREDNISOLONE SODIUM SUCC 125 MG IJ SOLR
60.0000 mg | Freq: Once | INTRAMUSCULAR | Status: AC
  Administered 2022-01-06: 60 mg via INTRAVENOUS
  Filled 2022-01-06: qty 2

## 2022-01-06 MED ORDER — IPRATROPIUM BROMIDE 0.02 % IN SOLN
0.5000 mg | Freq: Three times a day (TID) | RESPIRATORY_TRACT | Status: DC
Start: 2022-01-06 — End: 2022-01-07
  Administered 2022-01-06 – 2022-01-07 (×5): 0.5 mg via RESPIRATORY_TRACT
  Filled 2022-01-06 (×6): qty 2.5

## 2022-01-06 MED ORDER — FINASTERIDE 5 MG PO TABS
5.0000 mg | ORAL_TABLET | Freq: Every day | ORAL | Status: DC
  Administered 2022-01-06 – 2022-01-09 (×4): 5 mg via ORAL
  Filled 2022-01-06 (×4): qty 1

## 2022-01-06 MED ORDER — ADULT MULTIVITAMIN W/MINERALS CH
1.0000 | ORAL_TABLET | Freq: Every day | ORAL | Status: DC
  Administered 2022-01-06 – 2022-01-10 (×5): 1 via ORAL
  Filled 2022-01-06 (×5): qty 1

## 2022-01-06 MED ORDER — PANTOPRAZOLE SODIUM 40 MG PO TBEC
40.0000 mg | DELAYED_RELEASE_TABLET | Freq: Every day | ORAL | Status: DC
  Administered 2022-01-06 – 2022-01-10 (×5): 40 mg via ORAL
  Filled 2022-01-06 (×5): qty 1

## 2022-01-06 MED ORDER — FLUTICASONE PROPIONATE 50 MCG/ACT NA SUSP
2.0000 | Freq: Every day | NASAL | Status: DC
  Administered 2022-01-06 – 2022-01-10 (×4): 2 via NASAL
  Filled 2022-01-06: qty 16

## 2022-01-06 MED ORDER — HYDROCORTISONE ACETATE 25 MG RE SUPP
25.0000 mg | Freq: Every day | RECTAL | Status: DC | PRN
  Filled 2022-01-06: qty 1

## 2022-01-06 MED ORDER — ALFUZOSIN HCL ER 10 MG PO TB24
10.0000 mg | ORAL_TABLET | Freq: Every day | ORAL | Status: DC
  Administered 2022-01-06 – 2022-01-09 (×4): 10 mg via ORAL
  Filled 2022-01-06 (×5): qty 1

## 2022-01-06 MED ORDER — ENSURE ENLIVE PO LIQD
237.0000 mL | Freq: Three times a day (TID) | ORAL | Status: DC
  Administered 2022-01-07 – 2022-01-10 (×6): 237 mL via ORAL
  Filled 2022-01-06: qty 237

## 2022-01-06 NOTE — Progress Notes (Signed)
PROGRESS NOTE    James Sweeney  MBW:[id number] DOB: 1941/08/16 DOA: 01/05/2022 PCP: Lois Huxley, PA   Chief Complaint  Patient presents with   Shortness of Breath    Brief Narrative:  Patient is a pleasant 81 year old gentleman, hard of hearing, history of longstanding persistent atrial fibrillation,MALT lymphoma, type 2 diabetes, BPH, hyperlipidemia, hypertension, recurrent pneumonia presented to the ED with cough, chills, shortness of breath.  Patient noted to have increasing wheezing and placed on O2 of 2 L.  Patient noted to have recently been treated with oral antibiotics for pneumonia in a few weeks but did not complete the course.  Patient presented to urgent care noted to be hypoxic and sent to the ED.  Patient noted to be compliant with his anticoagulation of Xarelto.  Patient seen in the ED noted to be hyponatremic, concern for community-acquired pneumonia, worsening pancytopenia, AKI.  Patient placed empirically on IV antibiotics of Rocephin and azithromycin to cover for community-acquired pneumonia.  Pancultured.  COVID-19 PCR negative.  Respiratory viral panel obtained.  Patient placed on hydration.   Assessment & Plan:   Principal Problem:   CAP (community acquired pneumonia) Active Problems:   DM (diabetes mellitus), type 2 ([place])   Acquired thrombophilia ([place])   Essential hypertension   Thrombocytopenia (HCC)   Hypercholesterolemia   Permanent atrial fibrillation (HCC)   Marginal zone lymphoma (HCC)   Anemia   AKI (acute kidney injury) ([place])   Hyponatremia   Chronic diastolic CHF (congestive heart failure) (HCC)   Cardiomegaly   Acute respiratory failure with hypoxia (HCC)   Pancytopenia (HCC)  #1 acute respiratory failure with hypoxia likely secondary to probable community-acquired pneumonia/metapneumovirus. -Patient presented with hypoxia, symptoms clinically consistent with community-acquired pneumonia of cough, congestion, shortness of breath. -Patient  looks clinically dry on examination and doubt if secondary to volume overload. -Respiratory viral panel positive for metapneumovirus. -Chest x-ray with cardiomegaly, diffuse bilateral interstitial pulmonary opacity increased from prior concerning for edema, relatively focal airspace opacity in the right midlung corresponding to subpleural masslike opacity seen on prior CT. -Patient does not look volume overloaded on examination -Continue empiric IV Rocephin, IV azithromycin due to patient's immunocompromise state. -Placed on Flonase, Mucinex, Claritin, Mucinex, scheduled nebs. -Patient with some minimal wheezing noted on examination and as such we will give a dose of IV Solu-Medrol 60 mg x 1. -Supportive care.  2.  Hyponatremia -Felt likely secondary to hypovolemic hyponatremia. -Patient looks clinically dry on examination. -Patient on IV fluids with improvement with hyponatremia with sodium at 131 this morning. -Continue gentle hydration. -Follow.  3.  Acute kidney injury -Likely secondary to prerenal azotemia. -Improving with gentle hydration. -Follow-up.  4.??  Diabetes mellitus type 2 -Per family patient does not have a diagnosis of diabetes and were told patient had prediabetes which was being managed with a diet and lifestyle modifications and patient not on oral hypoglycemic agents. -Hemoglobin A1c 5.3 (01/05/2022) -Discontinue CBGs and sliding scale insulin.  5.  Permanent atrial fibrillation -Currently rate controlled. -Continue Xarelto for anticoagulation.  6.  Pancytopenia -Patient with a pancytopenia with worsening neutropenia white count of 1.3 today, hemoglobin of 8.2, platelet count of 95. -Check an anemia panel. -Patient with no overt bleeding. -Patient with history of MALT lymphoma with concern for possible bone marrow involvement, noted to have been seen by oncologist in the outpatient setting and bone marrow biopsy was offered at that time. -Continue IV antibiotics  for problem #1. -Consult with hematology/oncology for further evaluation and  management.  7.  Hyperlipidemia -Continue statin.  8.  Chronic diastolic CHF -Chest x-ray with cardiomegaly.?  Edema. -Patient looks clinically dry on examination. -BNP noted at 216. -Monitor closely with gentle hydration.  9.  Marginal zone lymphoma/MALT lymphoma -Being followed by oncology in the outpatient setting. -Consult with oncology. -   DVT prophylaxis: Xarelto Code Status: Full Family Communication: Updated patient, wife, grandson, granddaughter at bedside. Disposition:   Status is: Inpatient Remains inpatient appropriate because: Severity of illness   Consultants:  Oncology  Procedures:  Chest x-ray 01/05/2022    Antimicrobials:  IV azithromycin 01/05/2022>>> IV Rocephin 01/05/2022>>>>>   Subjective: Patient states not feeling well.  No chest pain.  No change in shortness of breath.  Still with productive cough.  Wife, grandson and granddaughter at bedside.  Objective: Vitals:   01/06/22 0953 01/06/22 1137 01/06/22 1628 01/06/22 1650  BP:  140/63  (!) 149/76  Pulse:  86  86  Resp:  18  19  Temp:  98.2 F (36.8 C)  98.9 F (37.2 C)  TempSrc:  Oral  Oral  SpO2: 94% 92% (!) 89% 92%  Weight:      Height:        Intake/Output Summary (Last 24 hours) at 01/06/2022 1937 Last data filed at 01/06/2022 1900 Gross per 24 hour  Intake 2317.51 ml  Output 1725 ml  Net 592.51 ml   Filed Weights   01/05/22 2146  Weight: 83.1 kg    Examination:  General exam: Appears calm and comfortable  Respiratory system: Some coarse breath sounds anterior lung fields minimal expiratory wheezing .  Fair air movement.  Speaking in full sentences.   Cardiovascular system: Irregularly irregular.  No JVD.  No murmurs rubs or gallops.  No lower extremity edema. Gastrointestinal system: Abdomen is nondistended, soft and nontender. No organomegaly or masses felt. Normal bowel sounds heard. Central  nervous system: Alert and oriented. No focal neurological deficits. Extremities: Symmetric 5 x 5 power. Skin: No rashes, lesions or ulcers Psychiatry: Judgement and insight appear fair. Mood & affect appropriate.     Data Reviewed: I have personally reviewed following labs and imaging studies  CBC: Recent Labs  Lab 01/05/22 1715 01/05/22 2027 01/06/22 0239  WBC 1.6* 1.5* 1.3*  NEUTROABS  --  1.2* 1.0*  HGB 8.8* 8.2* 8.2*  HCT 26.3* 23.2* 24.2*  MCV 114.8* 111.5* 111.5*  PLT 112* 182 95*    Basic Metabolic Panel: Recent Labs  Lab 01/05/22 1715 01/05/22 2233 01/06/22 0239  NA 128* 129* 131*  K 3.9 3.7 4.0  CL 97* 97* 101  CO2 _0 GLUCOSE 164* 113* 115*  BUN 26* 25* 23  CREATININE 1.52* 1.41* 1.27*  CALCIUM 8.5* 8.3* 8.1*  MG 1.9  --  1.9  PHOS 3.1  --  3.5    GFR: Estimated Creatinine Clearance: 47.9 mL/min (A) (by C-G formula based on SCr of 1.27 mg/dL (H)).  Liver Function Tests: Recent Labs  Lab 01/05/22 1715 01/06/22 0239  AST 55* 47*  ALT 52* 49*  ALKPHOS 78 69  BILITOT 1.4* 1.1  PROT 6.6 5.8*  ALBUMIN 3.7 3.2*    CBG: Recent Labs  Lab 01/05/22 2152 01/06/22 0815 01/06/22 1136 01/06/22 1649  GLUCAP 118* 125* 105* 113*     Recent Results (from the past 240 hour(s))  Blood culture (routine x 2)     Status: None (Preliminary result)   Collection Time: 01/05/22  5:01 PM   Specimen: BLOOD  Result  Value Ref Range Status   Specimen Description BLOOD SITE NOT SPECIFIED  Final   Special Requests   Final    BOTTLES DRAWN AEROBIC AND ANAEROBIC Blood Culture adequate volume   Culture   Final    NO GROWTH < 24 HOURS Performed at [facility], [address], [place], [place] [address]    Report Status PENDING  Incomplete  Resp Panel by RT-PCR (Flu A&B, Covid) Nasopharyngeal Swab     Status: None   Collection Time: 01/05/22  6:55 PM   Specimen: Nasopharyngeal Swab; Nasopharyngeal(NP) swabs in vial transport medium  Result Value Ref  Range Status   SARS Coronavirus 2 by RT PCR NEGATIVE NEGATIVE Final    Comment: (NOTE) SARS-CoV-2 target nucleic acids are NOT DETECTED.  The SARS-CoV-2 RNA is generally detectable in upper respiratory specimens during the acute phase of infection. The lowest concentration of SARS-CoV-2 viral copies this assay can detect is 138 copies/mL. A negative result does not preclude SARS-Cov-2 infection and should not be used as the sole basis for treatment or other patient management decisions. A negative result may occur with  improper specimen collection/handling, submission of specimen other than nasopharyngeal swab, presence of viral mutation(s) within the areas targeted by this assay, and inadequate number of viral copies(<138 copies/mL). A negative result must be combined with clinical observations, patient history, and epidemiological information. The expected result is Negative.  Fact Sheet for Patients:  EntrepreneurPulse.com.au  Fact Sheet for Healthcare Providers:  IncredibleEmployment.be  This test is no t yet approved or cleared by the [place] FDA and  has been authorized for detection and/or diagnosis of SARS-CoV-2 by FDA under an Emergency Use Authorization (EUA). This EUA will remain  in effect (meaning this test can be used) for the duration of the COVID-19 declaration under Section 564(b)(1) of the Act, 21 U.S.C.section 360bbb-3(b)(1), unless the authorization is terminated  or revoked sooner.       Influenza A by PCR NEGATIVE NEGATIVE Final   Influenza B by PCR NEGATIVE NEGATIVE Final    Comment: (NOTE) The Xpert Xpress SARS-CoV-2/FLU/RSV plus assay is intended as an aid in the diagnosis of influenza from Nasopharyngeal swab specimens and should not be used as a sole basis for treatment. Nasal washings and aspirates are unacceptable for Xpert Xpress SARS-CoV-2/FLU/RSV testing.  Fact Sheet for  Patients: EntrepreneurPulse.com.au  Fact Sheet for Healthcare Providers: IncredibleEmployment.be  This test is not yet approved or cleared by the [place] FDA and has been authorized for detection and/or diagnosis of SARS-CoV-2 by FDA under an Emergency Use Authorization (EUA). This EUA will remain in effect (meaning this test can be used) for the duration of the COVID-19 declaration under Section 564(b)(1) of the Act, 21 U.S.C. section 360bbb-3(b)(1), unless the authorization is terminated or revoked.  Performed at [facility], [place] [address], [place], [place] [address]   Respiratory (~20 pathogens) panel by PCR     Status: Abnormal   Collection Time: 01/05/22  6:55 PM   Specimen: Nasopharyngeal Swab; Respiratory  Result Value Ref Range Status   Adenovirus NOT DETECTED NOT DETECTED Final   Coronavirus 229E NOT DETECTED NOT DETECTED Final    Comment: (NOTE) The Coronavirus on the Respiratory Panel, DOES NOT test for the novel  Coronavirus (2019 nCoV)    Coronavirus HKU1 NOT DETECTED NOT DETECTED Final   Coronavirus NL63 NOT DETECTED NOT DETECTED Final   Coronavirus OC43 NOT DETECTED NOT DETECTED Final   Metapneumovirus DETECTED (A) NOT  DETECTED Final   Rhinovirus / Enterovirus NOT DETECTED NOT DETECTED Final   Influenza A NOT DETECTED NOT DETECTED Final   Influenza B NOT DETECTED NOT DETECTED Final   Parainfluenza Virus 1 NOT DETECTED NOT DETECTED Final   Parainfluenza Virus 2 NOT DETECTED NOT DETECTED Final   Parainfluenza Virus 3 NOT DETECTED NOT DETECTED Final   Parainfluenza Virus 4 NOT DETECTED NOT DETECTED Final   Respiratory Syncytial Virus NOT DETECTED NOT DETECTED Final   Bordetella pertussis NOT DETECTED NOT DETECTED Final   Bordetella Parapertussis NOT DETECTED NOT DETECTED Final   Chlamydophila pneumoniae NOT DETECTED NOT DETECTED Final   Mycoplasma pneumoniae NOT DETECTED NOT DETECTED Final    Comment: Performed at  [facility], [place] [address] [place], [place] [address]  Blood culture (routine x 2)     Status: None (Preliminary result)   Collection Time: 01/05/22 10:31 PM   Specimen: BLOOD  Result Value Ref Range Status   Specimen Description BLOOD LEFT ANTECUBITAL  Final   Special Requests   Final    BOTTLES DRAWN AEROBIC AND ANAEROBIC Blood Culture results may not be optimal due to an inadequate volume of blood received in culture bottles   Culture   Final    NO GROWTH < 12 HOURS Performed at [facility], [place] [address], [place], [place] [address]    Report Status PENDING  Incomplete  Expectorated Sputum Assessment w Gram Stain, Rflx to Resp Cult     Status: None   Collection Time: 01/05/22 11:45 PM   Specimen: Sputum  Result Value Ref Range Status   Specimen Description SPUTUM  Final   Special Requests NONE  Final   Sputum evaluation   Final    THIS SPECIMEN IS ACCEPTABLE FOR SPUTUM CULTURE Performed at [facility], [place] [address], [place], [place] [address]    Report Status 01/06/2022 FINAL  Final  Culture, Respiratory w Gram Stain     Status: None (Preliminary result)   Collection Time: 01/05/22 11:45 PM   Specimen: SPU  Result Value Ref Range Status   Specimen Description SPUTUM  Final   Special Requests NONE Reflexed from [id number]  Final   Gram Stain   Final    FEW SQUAMOUS EPITHELIAL CELLS PRESENT FEW GRAM POSITIVE COCCI IN PAIRS FEW GRAM NEGATIVE RODS FEW GRAM VARIABLE ROD Performed at [facility], [place] [address], [place], [place] [address]    Culture PENDING  Incomplete   Report Status PENDING  Incomplete         Radiology Studies: DG Chest Port 1 View  Result Date: 01/05/2022 CLINICAL DATA:  Shortness of breath, hypoxia EXAM: PORTABLE CHEST 1 VIEW COMPARISON:  Chest radiographs, 01/03/2022, CT chest abdomen pelvis, 09/16/2021 FINDINGS: Cardiomegaly. Diffuse bilateral interstitial pulmonary opacity, increased compared to prior. Relatively focal  airspace opacity of the right midlung, similar to prior. IMPRESSION: 1. Cardiomegaly with diffuse bilateral interstitial pulmonary opacity, increased compared to prior and most consistent with edema. 2. Relatively focal airspace opacity of the right mid lung, which corresponds to a subpleural masslike opacity seen on prior CT. Electronically Signed   By: Delanna Ahmadi M.D.   On: 01/05/2022 18:11   ECHOCARDIOGRAM COMPLETE  Result Date: 01/06/2022    ECHOCARDIOGRAM REPORT   Patient Name:   GRAM SIEDLECKI Date of Exam: 01/06/2022 Medical Rec #:  [id number]       Height:       70.0 in Accession #:  [id number]      Weight:       183.3 lb Date of Birth:  10-Jan-1941       BSA:          2.011 m Patient Age:    40 years        BP:           145/64 mmHg Patient Gender: M               HR:           86 bpm. Exam Location:  Inpatient Procedure: 2D Echo Indications:    Cardiomegaly  History:        Patient has prior history of Echocardiogram examinations, most                 recent 01/23/2016. Signs/Symptoms:Shortness of Breath; Risk                 Factors:Diabetes, Dyslipidemia and Hypertension.  Sonographer:    Arlyss Gandy Referring Phys: [place]  1. Left ventricular ejection fraction, by estimation, is 60 to 65%. The left ventricle has normal function. The left ventricle has no regional wall motion abnormalities. Left ventricular diastolic parameters are indeterminate.  2. Right ventricular systolic function is normal. The right ventricular size is normal. There is mildly elevated pulmonary artery systolic pressure. The estimated right ventricular systolic pressure is 53.6 mmHg.  3. Left atrial size was moderately dilated.  4. Right atrial size was mildly dilated.  5. The mitral valve is normal in structure. Trivial mitral valve regurgitation. No evidence of mitral stenosis.  6. The aortic valve is tricuspid. Aortic valve regurgitation is not visualized. No aortic stenosis is present.  7. The  inferior vena cava is dilated in size with <50% respiratory variability, suggesting right atrial pressure of 15 mmHg.  8. The patient was in atrial fibrillation. FINDINGS  Left Ventricle: Left ventricular ejection fraction, by estimation, is 60 to 65%. The left ventricle has normal function. The left ventricle has no regional wall motion abnormalities. The left ventricular internal cavity size was normal in size. There is  no left ventricular hypertrophy. Left ventricular diastolic parameters are indeterminate. Right Ventricle: The right ventricular size is normal. No increase in right ventricular wall thickness. Right ventricular systolic function is normal. There is mildly elevated pulmonary artery systolic pressure. The tricuspid regurgitant velocity is 2.61  m/s, and with an assumed right atrial pressure of 15 mmHg, the estimated right ventricular systolic pressure is 64.4 mmHg. Left Atrium: Left atrial size was moderately dilated. Right Atrium: Right atrial size was mildly dilated. Pericardium: There is no evidence of pericardial effusion. Mitral Valve: The mitral valve is normal in structure. Trivial mitral valve regurgitation. No evidence of mitral valve stenosis. Tricuspid Valve: The tricuspid valve is normal in structure. Tricuspid valve regurgitation is trivial. Aortic Valve: The aortic valve is tricuspid. Aortic valve regurgitation is not visualized. No aortic stenosis is present. Aortic valve mean gradient measures 8.0 mmHg. Aortic valve peak gradient measures 13.5 mmHg. Aortic valve area, by VTI measures 1.89  cm. Pulmonic Valve: The pulmonic valve was normal in structure. Pulmonic valve regurgitation is trivial. Aorta: The aortic root is normal in size and structure. Venous: The inferior vena cava is dilated in size with less than 50% respiratory variability, suggesting right atrial pressure of 15 mmHg. IAS/Shunts: No atrial level shunt detected by color flow Doppler.  LEFT VENTRICLE PLAX 2D LVIDd:  5.35 cm   Diastology LVIDs:         3.75 cm   LV e' medial:    12.80 cm/s LV PW:         1.02 cm   LV E/e' medial:  9.7 LV IVS:        1.03 cm   LV e' lateral:   15.10 cm/s LVOT diam:     2.00 cm   LV E/e' lateral: 8.2 LV SV:         75 LV SV Index:   37 LVOT Area:     3.14 cm  RIGHT VENTRICLE RV Basal diam:  3.83 cm RV Mid diam:    3.03 cm RV S prime:     14.60 cm/s TAPSE (M-mode): 1.3 cm LEFT ATRIUM           Index        RIGHT ATRIUM           Index LA diam:      4.20 cm 2.09 cm/m   RA Area:     22.00 cm LA Vol (A2C): 56.1 ml 27.89 ml/m  RA Volume:   64.60 ml  32.12 ml/m LA Vol (A4C): 89.4 ml 44.45 ml/m  AORTIC VALVE AV Area (Vmax):    2.12 cm AV Area (Vmean):   2.00 cm AV Area (VTI):     1.89 cm AV Vmax:           184.00 cm/s AV Vmean:          131.000 cm/s AV VTI:            0.397 m AV Peak Grad:      13.5 mmHg AV Mean Grad:      8.0 mmHg LVOT Vmax:         124.00 cm/s LVOT Vmean:        83.200 cm/s LVOT VTI:          0.239 m LVOT/AV VTI ratio: 0.60  AORTA Ao Root diam: 3.20 cm Ao Asc diam:  3.20 cm MITRAL VALVE                TRICUSPID VALVE MV Area (PHT): 4.21 cm     TR Peak grad:   27.2 mmHg MV Decel Time: 180 msec     TR Vmax:        261.00 cm/s MV E velocity: 124.00 cm/s                             SHUNTS                             Systemic VTI:  0.24 m                             Systemic Diam: 2.00 cm Dalton McleanMD Electronically signed by Franki Monte Signature Date/Time: 01/06/2022/4:14:17 PM    Final         Scheduled Meds:  amLODipine  10 mg Oral Daily   atorvastatin  20 mg Oral Daily   feeding supplement  237 mL Oral TID BM   fluticasone  2 spray Each Nare Daily   guaiFENesin  1,200 mg Oral BID   ipratropium  0.5 mg Nebulization Q8H   levalbuterol  0.63 mg Nebulization Q8H   loratadine  10 mg Oral Daily  methylPREDNISolone (SOLU-MEDROL) injection  60 mg Intravenous Once   multivitamin with minerals  1 tablet Oral Daily   pantoprazole  40 mg Oral Q0600    rivaroxaban  20 mg Oral Q supper   sodium chloride flush  3 mL Intravenous Q12H   Continuous Infusions:  sodium chloride     sodium chloride 75 mL/hr at 01/06/22 1432   azithromycin     cefTRIAXone (ROCEPHIN)  IV 2 g (01/06/22 1807)     LOS: 1 day    Time spent: 40 minutes    Irine Seal, MD [facility]   To contact the attending provider between 7A-7P or the covering provider during after hours 7P-7A, please log into the web site [email or web address] and access using universal [place] password for that web site. If you do not have the password, please call the hospital operator.  01/06/2022, 7:37 PM

## 2022-01-06 NOTE — Progress Notes (Signed)
Admission documentation not completed d/t communication barrier because of pt's very impaired hearing. Per grandson at bedside, pt's spouse will be here in the AM.  ?

## 2022-01-06 NOTE — Care Management (Signed)
?  Transition of Care (TOC) Screening Note ? ? ?Patient Details  ?Name: James Sweeney ?Date of Birth: [birth year] ? ? ?Transition of Care (TOC) CM/SW Contact:    ?Graves-Bigelow, Ocie Cornfield, RN ?Phone Number: ?01/06/2022, 2:49 PM ? ? ? ?Transition of Care Department [facility]) has reviewed patient and no TOC needs have been identified at this time. We will continue to monitor patient advancement through interdisciplinary progression rounds. If new patient transition needs arise, please place a TOC consult. ?  ?

## 2022-01-06 NOTE — Progress Notes (Signed)
Overnight events ? ?01/05/22 ~ 22:45  ?Notified by lab of critical lab value.  ?Pt's venous pO2 = 27mHg  ?No change in pt's condition since arrival to unit. ?TMitzi Hansen MD notified & return call received. No new orders d/t no change in pt's condition ? ?01/05/22 ~ 23:45 ?Pt coughing up small to moderate amount of blood tinged/tan, thick sputum. Per pt's family at bedside, pt was coughing this up yesterday as well. Sputum sample sent to lab & TMitzi Hansen MD notified ? ?01/06/22 ~ 04:16 ?Notified by lab of critical lab value.  ?WBC 1.3, similar to previous WBC resulted 3/5 PM. TMitzi Hansen MD notified ? ? ?

## 2022-01-06 NOTE — Progress Notes (Signed)
Initial Nutrition Assessment ? ?DOCUMENTATION CODES:  ? ?Not applicable ? ?INTERVENTION:  ?Liberalize diet to regular  ? ?Ensure Enlive po TID, each supplement provides 350 kcal and 20 grams of protein. ? ?MVI with minerals daily  ? ?NUTRITION DIAGNOSIS:  ? ?Inadequate oral intake related to decreased appetite, acute illness (pneumonia with shortness of breath) as evidenced by per patient/family report. ? ? ?GOAL:  ? ?Patient will meet greater than or equal to 90% of their needs ? ? ?MONITOR:  ? ?PO intake, Supplement acceptance, Labs, Weight trends ? ?REASON FOR ASSESSMENT:  ? ?Consult ?Assessment of nutrition requirement/status ? ?ASSESSMENT:  ? ?Pt is a 80 year old male with medical history significant of HTN, HLD, atrial fibrillation on Eliquis, MALT lymphoma of the lung, T2DM, BPH, recurrent PNA who was directed to the ED from urgent care [facility]) due to low oxygen saturation of 93% on room air and pt also presenting with known right lower lobe pneumonia, chills, fatigue, worsening cough, shortness of breath and dyspnea on exertion. Pt was admitted for further evaluation of CAP. ? ?Pt is currently on a heart healthy diet and meal completion of 50% on 01/06/22.  ? ?Met with pt at bedside. Pt asleep during most of visit, but pt's grand daughter and grandson present during visit and able to provide pt history. Per pt's grandchildren, pt has had a decreased appetite since yesterday due to shortness of breath and worsening cough. Pt's grandson reports that pt only ate one bit of his eggs this morning and maybe a bit of his [place] sausage. Pt's grandson also reports that pt ate a few bits of mashed potatoes last night. Prior to admission, pt's grandchildren report that pt typically endorses a really good appetite and eats ~5 meals a day. They report that pt typically wakes up really early and has something light to eat around 4 AM and then he goes to breakfast with his wife later that morning. Pt typically also eats  lunch and dinner and snacking during the day. Pt's grandchildren report that pt is not a picky eater and typically eats different types of meats, vegetables, grains, etc. Per pt's grandchildren report, pt lives at home with his wife and ambulates without the use of a walker or cane at baseline. They state that their grandfather is very active and has a good appetite at baseline. They report that his decreased appetite is unusual for him and related to his current symptoms. Per pt's grandchildren, no known unintentional weight loss suspected. They do report that over a year ago pt was ~198-202#, and that pt was cutting back on sodium and other foods and intentionally lost weight related to dietary changes.  ? ?Discussed with pt's family the importance of adequate PO intake to promote healing and to meet nutritional needs. Discussed oral nutrition supplementation with pt's family and they report that pt drinks Boost (vanilla) sometimes at home and that he likes Ensure vanilla. Dietetic intern to order Ensure (vanilla) TID for pt to aid in caloric and protein intake. Discussed MVI supplementation with pt's family and pt's family agreeable to oral nutrition supplementation and MVI supplementation plan.  ? ?Current wt: 83.1 kg  ?Per history, pt has experienced a 7% weight loss within the last 4 months which is not significant for time frame.  ? ?Medications reviewed and include: Novolog, protonix, Zithromax, rocephin  ? ?Labs reviewed and include: CBG: 125, A1C: 5.3, Na: 131 (low), Corrected calcium: 8.7 (low), Creatinine: 1.27 (high), hemoglobin: 8.2 (low)  ? ? ? ?  NUTRITION - FOCUSED PHYSICAL EXAM: ? ?Flowsheet Row Most Recent Value  ?Orbital Region No depletion  ?Upper Arm Region Mild depletion  ?Thoracic and Lumbar Region No depletion  ?Buccal Region No depletion  ?Temple Region No depletion  ?Clavicle Bone Region No depletion  ?Clavicle and Acromion Bone Region No depletion  ?Scapular Bone Region No depletion   ?Dorsal Hand No depletion  ?Patellar Region Mild depletion  ?Anterior Thigh Region Mild depletion  ?Posterior Calf Region Moderate depletion  ?Edema (RD Assessment) None  ?Hair Reviewed  ?Eyes Reviewed  ?Mouth Reviewed  ?Skin Reviewed  ?Nails Reviewed  ? ?  ? ? ?Diet Order:   ?Diet Order   ? ?       ?  Diet Heart Room service appropriate? Yes; Fluid consistency: Thin  Diet effective now       ?  ? ?  ?  ? ?  ? ? ?EDUCATION NEEDS:  ? ?Not appropriate for education at this time ? ?Skin:  Skin Assessment: Reviewed RN Assessment ? ?Last BM:  3/5 ? ?Height:  ? ?Ht Readings from Last 1 Encounters:  ?01/05/22 _0  (1.778 m)  ? ? ?Weight:  ? ?Wt Readings from Last 1 Encounters:  ?01/05/22 83.1 kg  ? ? ?Ideal Body Weight:  75.4 kg ? ?BMI:  Body mass index is 26.3 kg/m?. ? ?Estimated Nutritional Needs:  ? ?Kcal:  2000 - 2200 ? ?Protein:  100 - 115 grams ? ?Fluid:  >/= 2 L ? ? ? ?Maryruth Hancock, Dietetic Intern ?01/06/2022 5:00 PM ?

## 2022-01-06 NOTE — Progress Notes (Signed)
HEMATOLOGY-ONCOLOGY PROGRESS NOTE  ASSESSMENT AND PLAN: 81 y.o. male with   1. 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  If this is an isolated lesion this would make in Stage IE.   04/12/18 bx/resection of the right middle lobe revealed an extranodal marginal zone lymphoma    07/21/18 PET/CT revealed Interval resection of the RIGHT middle lobe pulmonary nodule. 2. Increase thickening of subpleural tissue in the RIGHT upper lobe with increased metabolic activity. In patient with lymphoma history, cannot exclude atypical thoracic lymphoma. 3. No change in small hypermetabolic mediastinal lymph nodes which are favored reactive. 4. Prostatomegaly with nodularity. 5. Stable RIGHT adrenal mass. Favor benign.   03/11/19 CT Chest revealed "Ill-defined ground-glass and consolidative nodules in both lungs remain stable, except for mild increase in size of 1.6 cm central right perihilar nodule. Consider continued followup by chest CT in 6 months. 2. Stable mild partially-calcified mediastinal and bilateral hilar lymphadenopathy. 3. Stable 2.6 cm right adrenal mass,, likely a benign adenoma. Recommend continued attention on follow-up CT. 4. Aortic and coronary artery atherosclerosis."    09/18/2019 CT Angio Chest/Abd/Pel ([id number]) revealed "1. No acute vascular findings. 2. Trace new right pleural effusion, cause uncertain. 3. Similar sub solid nodules and sub solid right subpleural airspace opacity. In this patient with history of lymphoma, the possibility of atypical pulmonary involvement is not readily excluded. Surveillance imaging of the lung lesions is recommended. 4. Nonspecific 1.8 cm focus of arterial phase enhancement in segment 2 of the liver. There is no prior accentuated metabolic activity in this vicinity on prior PET-CT of 07/21/2018. Possibilities for further workup of this lesion include hepatic protocol MRI with and without contrast, or surveillance on follow up imaging related to  the patient's lymphoma. 5. Chronically stable right adrenal mass, likely benign. 6. Other imaging findings of potential clinical significance: Aortic Atherosclerosis (ICD10-I70.0). Coronary atherosclerosis. Mild cardiomegaly. Calcified mediastinal adenopathy, stable. Postoperative findings in the right upper lobe. Stable small well-defined foci of sclerosis in the right fourth rib. Chronic superior endplate compression at L1. Right foraminal stenosis at L4-5 due to spurring. Lipoma deep to the left gluteus maximus muscle, stable, this mildly abuts the left sciatic nerve. Prostatomegaly."   03/12/2020 PET/CT ([id number]) revealed "1. Interval increase in size and FDG uptake associated with subpleural soft tissue within the anterior right upper lobe compatible with Deauville criteria 4/5 disease. 2. Paravertebral soft tissue within the posterior mediastinum medial to the descending aorta exhibits increased FDG uptake. Deauville criteria 4. 3. Mild increase in size and degree of FDG uptake associated with bilateral pulmonary nodules. 4. Similar mild to moderate increased FDG uptake associated with calcified mediastinal and hilar lymph nodes. This is a nonspecific finding and may reflect changes of granulomatous inflammation or infection. 5. Similar appearance of indeterminate right adrenal nodule which exhibits mild to moderate FDG uptake with SUV max of 5.4 6. No significant FDG uptake within the liver."   03/12/2020 MRI Liver ([id number]) revealed benign hemangioma, no hypermetabolic lesion  0/08/2724 PET/CT-relatively similar disease burden, some lesions including the superior segment of the left lower lobe pulmonary nodule are progressive but the majority of lesions are similar in size with relatively similar hypermetabolism, dominant lesion within the subpleural right upper lobe is relatively similar in size and hypermetabolism  09/16/2021 CT chest/abdomen/pelvis-right upper lobe peripheral pulmonary mass  along the pleura measures slightly larger, left lower lobe subsolid nodules also slightly increased in size   2) Pancytopenia with macrocytic anemia. Patient with worsening leukopenia and anemia  concerning for possible bone marrow involvement from his lymphoma versus MDS.   3) Severe hearing loss-which significantly limits patient's ability to hear and communicate.  Wife is helping him with most of the communication.  We used a stethoscope in his ears and his spoke to him through the [place] with some benefits.  4)      Patient Active Problem List    Diagnosis Date Noted   History of hearing loss 06/21/2020   Impacted cerumen of left ear 06/21/2020   Marginal zone lymphoma ([place]) 03/25/2020   Counseling regarding advance care planning and goals of care 03/25/2020   Renal insufficiency 04/19/2018   Chronic anticoagulation 04/19/2018   Lung mass 04/12/2018   Permanent atrial fibrillation ([place]) 01/19/2018   Pneumothorax 01/25/2016   Pneumothorax after biopsy 01/25/2016   Coronary artery calcification seen on CT scan 01/21/2016   Murmur, cardiac 01/21/2016   Hypercholesterolemia 01/21/2016   Essential hypertension 01/09/2016   BPH (benign prostatic hyperplasia) 01/09/2016   Solitary pulmonary nodule 01/09/2016   Thrombocytopenia ([place]) 01/09/2016   Cervical spondylosis without myelopathy 12/01/2013   Lumbago 12/01/2013   Diabetes mellitus       PLAN:  -Labs from this admission have been reviewed.  Discussed with grandson at the bedside.  We discussed the worsening anemia, leukopenia, thrombocytopenia with concern for progression of extranodal marginal zone lymphoma and possible bone marrow involvement with this. -Reviewed previous discussion in our office with the patient and his wife about proceeding with a bone marrow biopsy but they were not interested in proceeding at that time. -I again recommended proceeding with bone marrow biopsy to further evaluate his pancytopenia.  I explained  the procedure in detail.  The patient and family would like to discuss this further before deciding if they would like to proceed. -I further discussed with the patient and his family that if he is not interested in pursuing a bone marrow biopsy that we can have further discussion regarding goals of care and consideration of palliative care/hospice. -Recommend PRBC transfusion for hemoglobin less than 7.5 and platelets for platelet count less than 10,000. -Okay to continue anticoagulation as long as platelet count is 50,000 or higher.  Mikey Bussing, DNP, AGPCNP-BC, AOCNP  SUBJECTIVE: Mr. Bogus has been followed by our office for pulmonary extranodal marginal zone lymphoma in the right middle lobe.  This was an isolated lesion it would make it stage IE.  He was last seen in our office on 09/02/2021.  He was noted to have pancytopenia with macrocytic anemia with worsening leukopenia which was concerning for possible bone marrow involvement from his lymphoma versus MDS.  At his last visit, additional work-up for his pancytopenia including a bone marrow biopsy should be performed.  At that visit, the patient and his wife wanted to hold off on additional labs and consideration of bone marrow biopsy.  They were advised to follow-up in 3 months but did not come to this appointment.  He presented to the emergency department with shortness of breath.  He was seen in urgent care a few weeks ago and diagnosed with pneumonia.  He was given Augmentin.  He went back to urgent care and was found to be hypoxic and was brought to the emergency department.  He had a temp at home of 100.4.  Admission CBC showed a WBC of 1.6, hemoglobin 8.8, MCV 114.8, platelets 112,000.  Positive for metapneumovirus.  He has no evidence of iron deficiency, vitamin B12 deficiency, or folate deficiency.  Chest x-ray  showed cardiomegaly with diffuse bilateral interstitial pulmonary opacity which is increased compared to prior most consistent  with edema.  Today, the patient is resting quietly in bed.  He has a lot of coughing with sputum production.  Very hard of hearing.  Not currently febrile.  No bleeding reported.  His grandson is at the bedside.  Oncology History  Marginal zone lymphoma ([place])  03/25/2020 Initial Diagnosis   Marginal zone lymphoma ([place])   04/11/2020 -  Chemotherapy   The patient had riTUXimab-pvvr (RUXIENCE) 800 mg in sodium chloride 0.9 % 250 mL (2.4242 mg/mL) infusion, 375 mg/m2 = 800 mg, Intravenous,  Once, 2 of 2 cycles Administration: 800 mg (04/11/2020), 800 mg (04/17/2020)   for chemotherapy treatment.      REVIEW OF SYSTEMS:   Review of Systems  Constitutional:  Positive for malaise/fatigue.  HENT:  Positive for hearing loss.   Eyes: Negative.   Respiratory:  Positive for cough and sputum production. Negative for hemoptysis.   Cardiovascular:  Negative for chest pain.  Gastrointestinal:  Negative for nausea and vomiting.  Genitourinary: Negative.   Skin: Negative.   Neurological: Negative.    I have reviewed the past medical history, past surgical history, social history and family history with the patient and they are unchanged from previous note.   PHYSICAL EXAMINATION: ECOG PERFORMANCE STATUS: 3 - Symptomatic, >50% confined to bed  Vitals:   01/06/22 0953 01/06/22 1137  BP:  140/63  Pulse:  86  Resp:  18  Temp:  98.2 F (36.8 C)  SpO2: 94% 92%   Filed Weights   01/05/22 2146  Weight: 83.1 kg    Intake/Output from previous day: 03/05 0701 - 03/06 0700 In: 2297.5 [P.O.:480; I.V.:492.5; IV Piggyback:1325] Out: 325 [Urine:325]  Physical Exam Constitutional:      Appearance: He is ill-appearing.  HENT:     Head: Normocephalic.  Cardiovascular:     Rate and Rhythm: Normal rate and regular rhythm.  Pulmonary:     Comments: Diminished breath sounds Abdominal:     Palpations: Abdomen is soft.  Skin:    General: Skin is warm and dry.  Neurological:     Mental Status: He  is alert. Mental status is at baseline.    LABORATORY DATA:  I have reviewed the data as listed CMP Latest Ref Rng & Units 01/06/2022 01/05/2022 01/05/2022  Glucose 70 - 99 mg/dL 115(H) 113(H) 164(H)  BUN 8 - 23 mg/dL 23 25(H) 26(H)  Creatinine 0.61 - 1.24 mg/dL 1.27(H) 1.41(H) 1.52(H)  Sodium 135 - 145 mmol/L 131(L) 129(L) 128(L)  Potassium 3.5 - 5.1 mmol/L 4.0 3.7 3.9  Chloride 98 - 111 mmol/L 101 97(L) 97(L)  CO2 22 - 32 mmol/L _0 Calcium 8.9 - 10.3 mg/dL 8.1(L) 8.3(L) 8.5(L)  Total Protein 6.5 - 8.1 g/dL 5.8(L) - 6.6  Total Bilirubin 0.3 - 1.2 mg/dL 1.1 - 1.4(H)  Alkaline Phos 38 - 126 U/L 69 - 78  AST 15 - 41 U/L 47(H) - 55(H)  ALT 0 - 44 U/L 49(H) - 52(H)    Lab Results  Component Value Date   WBC 1.3 (LL) 01/06/2022   HGB 8.2 (L) 01/06/2022   HCT 24.2 (L) 01/06/2022   MCV 111.5 (H) 01/06/2022   PLT 95 (L) 01/06/2022   NEUTROABS 1.0 (L) 01/06/2022    No results found for: CEA1, CEA, ZHY865, CA125, PSA1  DG Chest Port 1 View  Result Date: 01/05/2022 CLINICAL DATA:  Shortness of breath, hypoxia EXAM:  PORTABLE CHEST 1 VIEW COMPARISON:  Chest radiographs, 01/03/2022, CT chest abdomen pelvis, 09/16/2021 FINDINGS: Cardiomegaly. Diffuse bilateral interstitial pulmonary opacity, increased compared to prior. Relatively focal airspace opacity of the right midlung, similar to prior. IMPRESSION: 1. Cardiomegaly with diffuse bilateral interstitial pulmonary opacity, increased compared to prior and most consistent with edema. 2. Relatively focal airspace opacity of the right mid lung, which corresponds to a subpleural masslike opacity seen on prior CT. Electronically Signed   By: Delanna Ahmadi M.D.   On: 01/05/2022 18:11     No future appointments.    LOS: 1 day

## 2022-01-06 NOTE — Progress Notes (Signed)
Echocardiogram ?2D Echocardiogram has been performed. ? ?James Sweeney ?01/06/2022, 10:41 AM ?

## 2022-01-07 DIAGNOSIS — J189 Pneumonia, unspecified organism: Secondary | ICD-10-CM | POA: Diagnosis not present

## 2022-01-07 DIAGNOSIS — J9601 Acute respiratory failure with hypoxia: Secondary | ICD-10-CM | POA: Diagnosis not present

## 2022-01-07 DIAGNOSIS — N179 Acute kidney failure, unspecified: Secondary | ICD-10-CM | POA: Diagnosis not present

## 2022-01-07 DIAGNOSIS — I5032 Chronic diastolic (congestive) heart failure: Secondary | ICD-10-CM | POA: Diagnosis not present

## 2022-01-07 LAB — COMPREHENSIVE METABOLIC PANEL
ALT: 65 U/L — ABNORMAL HIGH (ref 0–44)
AST: 55 U/L — ABNORMAL HIGH (ref 15–41)
Albumin: 3 g/dL — ABNORMAL LOW (ref 3.5–5.0)
Alkaline Phosphatase: 74 U/L (ref 38–126)
Anion gap: 11 (ref 5–15)
BUN: 21 mg/dL (ref 8–23)
CO2: 20 mmol/L — ABNORMAL LOW (ref 22–32)
Calcium: 7.9 mg/dL — ABNORMAL LOW (ref 8.9–10.3)
Chloride: 103 mmol/L (ref 98–111)
Creatinine, Ser: 1.2 mg/dL (ref 0.61–1.24)
GFR, Estimated: >60 mL/min (ref >60)
Glucose, Bld: 200 mg/dL — ABNORMAL HIGH (ref 70–99)
Potassium: 4 mmol/L (ref 3.5–5.1)
Sodium: 134 mmol/L — ABNORMAL LOW (ref 135–145)
Total Bilirubin: 1 mg/dL (ref 0.3–1.2)
Total Protein: 5.9 g/dL — ABNORMAL LOW (ref 6.5–8.1)

## 2022-01-07 LAB — CBC WITH DIFFERENTIAL/PLATELET
Abs Immature Granulocytes: 0 10*3/uL (ref 0.00–0.07)
Basophils Absolute: 0 10*3/uL (ref 0.0–0.1)
Basophils Relative: 0 %
Eosinophils Absolute: 0 10*3/uL (ref 0.0–0.5)
Eosinophils Relative: 0 %
HCT: 24.8 % — ABNORMAL LOW (ref 39.0–52.0)
Hemoglobin: 8.9 g/dL — ABNORMAL LOW (ref 13.0–17.0)
Immature Granulocytes: 0 %
Lymphocytes Relative: 7 %
Lymphs Abs: 0.1 10*3/uL — ABNORMAL LOW (ref 0.7–4.0)
MCH: 39.6 pg — ABNORMAL HIGH (ref 26.0–34.0)
MCHC: 35.9 g/dL (ref 30.0–36.0)
MCV: 110.2 fL — ABNORMAL HIGH (ref 80.0–100.0)
Monocytes Absolute: 0 10*3/uL — ABNORMAL LOW (ref 0.1–1.0)
Monocytes Relative: 1 %
Neutro Abs: 1.2 10*3/uL — ABNORMAL LOW (ref 1.7–7.7)
Neutrophils Relative %: 92 %
Platelets: 92 10*3/uL — ABNORMAL LOW (ref 150–400)
RBC: 2.25 MIL/uL — ABNORMAL LOW (ref 4.22–5.81)
RDW: 14.6 % (ref 11.5–15.5)
WBC: 1.3 10*3/uL — CL (ref 4.0–10.5)
nRBC: 0 % (ref 0.0–0.2)

## 2022-01-07 LAB — LEGIONELLA PNEUMOPHILA SEROGP 1 UR AG: L. pneumophila Serogp 1 Ur Ag: NEGATIVE

## 2022-01-07 LAB — MAGNESIUM: Magnesium: 2.1 mg/dL (ref 1.7–2.4)

## 2022-01-07 LAB — PROCALCITONIN: Procalcitonin: 0.11 ng/mL

## 2022-01-07 MED ORDER — LEVALBUTEROL HCL 0.63 MG/3ML IN NEBU
0.6300 mg | INHALATION_SOLUTION | Freq: Three times a day (TID) | RESPIRATORY_TRACT | Status: DC
  Administered 2022-01-07 – 2022-01-08 (×2): 0.63 mg via RESPIRATORY_TRACT
  Filled 2022-01-07: qty 3

## 2022-01-07 MED ORDER — TRAMADOL HCL 50 MG PO TABS
25.0000 mg | ORAL_TABLET | Freq: Two times a day (BID) | ORAL | Status: DC
  Administered 2022-01-07 – 2022-01-10 (×6): 25 mg via ORAL
  Filled 2022-01-07 (×6): qty 1

## 2022-01-07 MED ORDER — IPRATROPIUM BROMIDE 0.02 % IN SOLN
0.5000 mg | Freq: Three times a day (TID) | RESPIRATORY_TRACT | Status: DC
  Administered 2022-01-07 – 2022-01-08 (×2): 0.5 mg via RESPIRATORY_TRACT
  Filled 2022-01-07: qty 2.5

## 2022-01-07 MED ORDER — FUROSEMIDE 10 MG/ML IJ SOLN
20.0000 mg | Freq: Once | INTRAMUSCULAR | Status: AC
  Administered 2022-01-07: 20 mg via INTRAVENOUS
  Filled 2022-01-07: qty 2

## 2022-01-07 MED ORDER — CHLORHEXIDINE GLUCONATE CLOTH 2 % EX PADS
6.0000 | MEDICATED_PAD | Freq: Every day | CUTANEOUS | Status: DC
  Administered 2022-01-08: 6 via TOPICAL

## 2022-01-07 MED ORDER — TRAMADOL HCL 50 MG PO TABS: 25.0000 mg | ORAL_TABLET | Freq: Two times a day (BID) | ORAL | Status: DC

## 2022-01-07 NOTE — Evaluation (Signed)
Physical Therapy Evaluation ?Patient Details ?Name: James Sweeney ?MRN: [id number] ?DOB: 1941-06-08 ?Today's Date: 01/07/2022 ? ?History of Present Illness ? Patient is a 81 y/o male who presents on 01/05/22 with SOB, cough. Found to have acute respiratory failure with hypoxia and CAP. Diagnosed with PNA recently, but did not finish antibiotics. PMH includes HOH, DM, HTN, persistent atrial fibrillation, MALT lymphoma.  ?Clinical Impression ? Patient presents with back pain, generalized weakness, dyspnea on exertion, decreased endurance and impaired mobility s/p above. Pt lives at home with wife who has dementia and is independent with ADLs at baseline. Today, pt requires Min-Mod A for transfers and gait training with use of RW for support. 2/4 DOE with ambulation and SP02 remained >90% on RA.  Grandson present during session and helped provide PLOF/history as pt very HOH. Grandson plans to move in with them at some point to help out however not immediately. Encouraged walking a few times daily with staff/mobility tech. Recommend maximizing HH services- HHPT/OT/aide to assist with transition to home. Will follow acutely to maximize independence and mobility prior to return home. ?   ? ?Recommendations for follow up therapy are one component of a multi-disciplinary discharge planning process, led by the attending physician.  Recommendations may be updated based on patient status, additional functional criteria and insurance authorization. ? ?Follow Up Recommendations Home health PT ? ?  ?Assistance Recommended at Discharge Intermittent Supervision/Assistance  ?Patient can return home with the following ? A little help with walking and/or transfers;A little help with bathing/dressing/bathroom;Assistance with cooking/housework;Assist for transportation;Help with stairs or ramp for entrance ? ?  ?Equipment Recommendations BSC/3in1  ?Recommendations for Other Services ?    ?  ?Functional Status Assessment Patient has had a  recent decline in their functional status and demonstrates the ability to make significant improvements in function in a reasonable and predictable amount of time.  ? ?  ?Precautions / Restrictions Precautions ?Precautions: Fall;Other (comment) ?Precaution Comments: watch 02 ?Restrictions ?Weight Bearing Restrictions: No  ? ?  ? ?Mobility ? Bed Mobility ?Overal bed mobility: Needs Assistance ?Bed Mobility: Supine to Sit ?  ?  ?Supine to sit: HOB elevated, Mod assist ?  ?  ?General bed mobility comments: Mod A with trunk to get to EOB with pt reaching for therapist. ?  ? ?Transfers ?Overall transfer level: Needs assistance ?Equipment used: Rolling walker (2 wheels) ?Transfers: Sit to/from Stand, Bed to chair/wheelchair/BSC ?Sit to Stand: Min assist ?Stand pivot transfers: Min guard ?  ?  ?  ?  ?General transfer comment: Min A to power to standing with cues for hand placement/technique as pt wanting to pull up on RW; stood from EOB x2, transferred to [facility]. ?  ? ?Ambulation/Gait ?Ambulation/Gait assistance: Min guard ?Gait Distance (Feet): 70 Feet ?Assistive device: Rolling walker (2 wheels) ?Gait Pattern/deviations: Step-through pattern, Decreased stride length, Trunk flexed ?Gait velocity: decreased ?  ?  ?General Gait Details: Slow, mildly unsteady gait with 1 standing rest break. 2/4 DOE. Sp02 stayed >90% on RA. Reports back pain. ? ?Stairs ?  ?  ?  ?  ?  ? ?Wheelchair Mobility ?  ? ?Modified Rankin (Stroke Patients Only) ?  ? ?  ? ?Balance Overall balance assessment: Needs assistance ?Sitting-balance support: Feet supported, No upper extremity supported ?Sitting balance-Leahy Scale: Good ?  ?  ?Standing balance support: During functional activity, Reliant on assistive device for balance ?Standing balance-Leahy Scale: Poor ?  ?  ?  ?  ?  ?  ?  ?  ?  ?  ?  ?  ?   ? ? ? ?  Pertinent Vitals/Pain Pain Assessment ?Pain Assessment: Faces ?Faces Pain Scale: Hurts little more ?Pain Location: chronic back pain ?Pain  Descriptors / Indicators: Discomfort ?Pain Intervention(s): Monitored during session, Repositioned  ? ? ?Home Living Family/patient expects to be discharged to:: Private residence ?Living Arrangements: Spouse/significant other ?Available Help at Discharge: Family;Available PRN/intermittently ?Type of Home: House ?Home Access: Stairs to enter ?  ?Entrance Stairs-Number of Steps: 1 flight vs ramp in back of house ?  ?Home Layout: One level ?Home Equipment: Conservation officer, nature (2 wheels) ?   ?  ?Prior Function Prior Level of Function : Independent/Modified Independent ?  ?  ?  ?  ?  ?  ?Mobility Comments: independent ?ADLs Comments: independent, bird baths ?  ? ? ?Hand Dominance  ?   ? ?  ?Extremity/Trunk Assessment  ? Upper Extremity Assessment ?Upper Extremity Assessment: Defer to OT evaluation ?  ? ?Lower Extremity Assessment ?Lower Extremity Assessment: Generalized weakness ?  ? ?   ?Communication  ? Communication: HOH  ?Cognition Arousal/Alertness: Awake/alert ?Behavior During Therapy: [facility] for tasks assessed/performed ?Overall Cognitive Status: Difficult to assess ?  ?  ?  ?  ?  ?  ?  ?  ?  ?  ?  ?  ?  ?  ?  ?  ?  ?  ?  ? ?  ?General Comments General comments (skin integrity, edema, etc.): Grandson present during session. Sp02 remained >90% on RA during activity. ? ?  ?Exercises    ? ?Assessment/Plan  ?  ?PT Assessment Patient needs continued PT services  ?PT Problem List Decreased strength;Decreased mobility;Pain;Decreased balance;Decreased activity tolerance;Cardiopulmonary status limiting activity ? ?   ?  ?PT Treatment Interventions Therapeutic exercise;Gait training;Patient/family education;Therapeutic activities;Functional mobility training;Balance training;DME instruction   ? ?PT Goals (Current goals can be found in the Care Plan section)  ?Acute Rehab PT Goals ?Patient Stated Goal: to go home ?PT Goal Formulation: With patient ?Time For Goal Achievement: 01/21/22 ?Potential to Achieve Goals: Fair ? ?   ?Frequency Min 3X/week ?  ? ? ?Co-evaluation   ?  ?  ?  ?  ? ? ?  ?AM-PAC PT "6 Clicks" Mobility  ?Outcome Measure Help needed turning from your back to your side while in a flat bed without using bedrails?: A Little ?Help needed moving from lying on your back to sitting on the side of a flat bed without using bedrails?: A Little ?Help needed moving to and from a bed to a chair (including a wheelchair)?: A Little ?Help needed standing up from a chair using your arms (e.g., wheelchair or bedside chair)?: A Little ?Help needed to walk in hospital room?: A Little ?Help needed climbing 3-5 steps with a railing? : A Lot ?6 Click Score: 17 ? ?  ?End of Session Equipment Utilized During Treatment: Gait belt ?Activity Tolerance: Patient limited by fatigue ?Patient left: Other (comment) (on [facility] with grandson present) ?Nurse Communication: Mobility status;Other (comment) (not needing 02) ?PT Visit Diagnosis: Muscle weakness (generalized) (M62.81);Difficulty in walking, not elsewhere classified (R26.2);Other (comment) (DOE) ?  ? ?Time: 1355-1420 ?PT Time Calculation (min) (ACUTE ONLY): 25 min ? ? ?Charges:   PT Evaluation ?$PT Eval Moderate Complexity: 1 Mod ?PT Treatments ?$Gait Training: 8-22 mins ?  ?   ? ? ?Marisa Severin, PT, DPT ?Acute Rehabilitation Services ?Pager [phone] ?Office [phone] ? ? ? ? ?[place] ?01/07/2022, 3:49 PM ? ?

## 2022-01-07 NOTE — TOC Progression Note (Signed)
Transition of Care (TOC) - Progression Note  ? ? ?Patient Details  ?Name: James Sweeney ?MRN: [id number] ?Date of Birth: 04/26/1941 ? ?Transition of Care (TOC) CM/SW Contact  ?Zenon Mayo, RN ?Phone Number: ?01/07/2022, 4:14 PM ? ?Clinical Narrative:    ?NCM received notification from physical therapy that patient needs HHPT, HHOT, HHAIDE,  NCM spoke with grandson [name] at the bedside, he states patient lives with his wife, he is HOH.  [name] is support person , who will transport him home at discharge.  He is not sure if patient will need a rolling walker or not, he thought he saw one at the patient's home but will let the Nurse know if he will need a walker.  NCM offered choice, he has no preference, NCM informed him will try to see if [name] can take referral, NCM made referral to [facility] with [facility] Awaiting call back.  ? ? ?  ?  ? ?Expected Discharge Plan and Services ?  ?  ?  ?  ?  ?                ?  ?  ?  ?  ?  ?  ?  ?  ?  ?  ? ? ?Social Determinants of Health (SDOH) Interventions ?  ? ?Readmission Risk Interventions ?No flowsheet data found. ? ?

## 2022-01-07 NOTE — TOC Progression Note (Signed)
Transition of Care (TOC) - Progression Note  ? ? ?Patient Details  ?Name: James Sweeney ?MRN: [id number] ?Date of Birth: 1941-02-14 ? ?Transition of Care (TOC) CM/SW Contact  ?Zenon Mayo, RN ?Phone Number: ?01/07/2022, 5:05 PM ? ?Clinical Narrative:    ?NCM received notification from physical therapy that patient needs HHPT, HHOT, HHAIDE,  NCM spoke with grandson [name] at the bedside, he states patient lives with his wife, he is HOH.  [name] is support person , who will transport him home at discharge.  He is not sure if patient will need a rolling walker or not, he thought he saw one at the patient's home but will let the Nurse know if he will need a walker.  NCM offered choice, he has no preference, NCM informed him will try to see if [name] can take referral, NCM made referral to [facility] with [facility]. Awaiting call back.  ? ?36- per [facility] with [name] , he can take referral.  Soc will begin 24 to 48 hrs post dc.  TOC will continue to follow for dc needs. Will need HH orders.  ?  ? ? ?Expected Discharge Plan: [place] ?Barriers to Discharge: Continued Medical Work up ? ?Expected Discharge Plan and Services ?Expected Discharge Plan: [place] ?In-house Referral: NA ?Discharge Planning Services: CM Consult ?Post Acute Care Choice: Home Health ?Living arrangements for the past 2 months: [place] ?                ?  ?DME Agency: NA ?  ?  ?  ?HH Arranged: PT, OT, Nurse's Aide ?[place] Agency: [place] ?Date HH Agency Contacted: 01/07/22 ?Time [place]: [id number] ?Representative spoke with at [place]: [name] ? ? ?Social Determinants of Health (SDOH) Interventions ?  ? ?Readmission Risk Interventions ?No flowsheet data found. ? ?

## 2022-01-07 NOTE — Progress Notes (Addendum)
PROGRESS NOTE    James Sweeney  QVZ:[id number] DOB: [birth year] DOA: 01/05/2022 PCP: Lois Huxley, PA   Chief Complaint  Patient presents with   Shortness of Breath    Brief Narrative:  Patient is a pleasant 81 year old gentleman, hard of hearing, history of longstanding persistent atrial fibrillation,MALT lymphoma, type 2 diabetes, BPH, hyperlipidemia, hypertension, recurrent pneumonia presented to the ED with cough, chills, shortness of breath.  Patient noted to have increasing wheezing and placed on O2 of 2 L.  Patient noted to have recently been treated with oral antibiotics for pneumonia in a few weeks but did not complete the course.  Patient presented to urgent care noted to be hypoxic and sent to the ED.  Patient noted to be compliant with his anticoagulation of Xarelto.  Patient seen in the ED noted to be hyponatremic, concern for community-acquired pneumonia, worsening pancytopenia, AKI.  Patient placed empirically on IV antibiotics of Rocephin and azithromycin to cover for community-acquired pneumonia.  Pancultured.  COVID-19 PCR negative.  Respiratory viral panel obtained.  Patient placed on hydration.   Assessment & Plan:   Principal Problem:   CAP (community acquired pneumonia) Active Problems:   DM (diabetes mellitus), type 2 ([place])   Acquired thrombophilia ([place])   Essential hypertension   Thrombocytopenia (HCC)   Hypercholesterolemia   Permanent atrial fibrillation (HCC)   Marginal zone lymphoma (HCC)   Anemia   AKI (acute kidney injury) ([place])   Hyponatremia   Chronic diastolic CHF (congestive heart failure) (HCC)   Cardiomegaly   Acute respiratory failure with hypoxia (HCC)   Pancytopenia (HCC)  #1 acute respiratory failure with hypoxia likely secondary to probable community-acquired pneumonia/metapneumovirus. -Patient presented with hypoxia, symptoms clinically consistent with community-acquired pneumonia of cough, congestion, shortness of breath. -Patient  looks clinically dry on examination and doubt if secondary to volume overload. -Respiratory viral panel positive for metapneumovirus. -Chest x-ray with cardiomegaly, diffuse bilateral interstitial pulmonary opacity increased from prior concerning for edema, relatively focal airspace opacity in the right midlung corresponding to subpleural masslike opacity seen on prior CT. -Patient does not look volume overloaded on examination however patient with some diffuse coarse breath sounds and some crackles noted. -Continue empiric IV Rocephin, IV azithromycin due to patient's immunocompromised state. -Continue Flonase, Mucinex, Claritin, Mucinex, scheduled nebs. -Patient with some minimal wheezing noted on examination and as such received a dose of IV Solu-Medrol 60 mg x 1 on 01/06/2022 with improvement with wheezing.   -Saline lock IV fluids.   -Lasix 20 mg IV x1.   -Monitor urine output.   -If some clinical improvement after IV Lasix we will give another dose of Lasix 20 mg IV every 12 hours x2 doses.  -Supportive care.  2.  Hyponatremia -Felt likely secondary to hypovolemic hyponatremia. -Patient looked clinically dry on examination. -Patient on IV fluids with improvement with hyponatremia with sodium at 134 this morning. -Saline lock IV fluid. -Lasix 20 mg IV x1 ordered today. -Repeat labs in the a.m.  3.  Acute kidney injury on CKD stage IIIa -Likely secondary to prerenal azotemia. -Improved with hydration.   -Patient given a dose of IV Lasix today, monitor kidney function.    4.??  Diabetes mellitus type 2 -Per family patient does not have a diagnosis of diabetes and were told patient had prediabetes which was being managed with a diet and lifestyle modifications and patient not on oral hypoglycemic agents. -Hemoglobin A1c 5.3 (01/05/2022) -Discontinued CBGs and sliding scale insulin.  5.  Permanent atrial fibrillation -  Currently rate controlled. -Continue Xarelto for  anticoagulation. -Hold Xarelto if platelet count <50,000, no significant bleeding.  6.  Pancytopenia -Patient with a pancytopenia with worsening neutropenia white count of 1.3 today, hemoglobin of 8.9, platelet count of 92. -Anemia panel consistent with anemia of chronic disease. -Patient with no overt bleeding. -Patient with history of MALT lymphoma with concern for possible bone marrow involvement, noted to have been seen by oncologist in the outpatient setting and bone marrow biopsy was offered at that time. -Patient seen by hematology/oncology who had recommended bone marrow biopsy that family is thinking about. -Continue IV antibiotics for problem #1.  7.  Hyperlipidemia -Statin.  8.  Chronic diastolic CHF -Chest x-ray with cardiomegaly.?  Edema. -Patient looks clinically dry on examination. -Patient noted with some diffuse crackles/coarse breath sounds on examination today. -BNP noted at 216. -Saline lock IV fluids. -Trial of Lasix 20 mg IV x1. -Strict I's and O's, daily weights, monitor urine output.  9.  Marginal zone lymphoma/MALT lymphoma -Being followed by oncology in the outpatient setting. -Cardiology consulted, assessed patient and had recommended bone marrow biopsy that patient and family were thinking about.  -Per oncology.  10.  Urinary retention -Check bladder scan. -Place Foley catheter. -Continue home regimen Proscar, uroxatral.   DVT prophylaxis: Xarelto Code Status: Full Family Communication: Updated patient, wife, granddaughter at bedside. Disposition:   Status is: Inpatient Remains inpatient appropriate because: Severity of illness   Consultants:  Oncology: Dr. Irene Limbo 01/06/2022  Procedures:  Chest x-ray 01/05/2022    Antimicrobials:  IV azithromycin 01/05/2022>>> IV Rocephin 01/05/2022>>>>>   Subjective: Patient states he feels terrible.  No change with shortness of breath.  Denies any chest pain.  States having some bouts of hemoptysis per wife  and granddaughter at bedside.  Objective: Vitals:   01/07/22 0030 01/07/22 0432 01/07/22 0436 01/07/22 1235  BP:   133/71 129/74  Pulse:  77 80 87  Resp:  20 20 20   Temp: 97.9 F (36.6 C)  97.9 F (36.6 C) 98 F (36.7 C)  TempSrc:   Oral Oral  SpO2:  98% 99% 96%  Weight:      Height:        Intake/Output Summary (Last 24 hours) at 01/07/2022 1241 Last data filed at 01/06/2022 2350 Gross per 24 hour  Intake 1414.73 ml  Output 450 ml  Net 964.73 ml    Filed Weights   01/05/22 2146  Weight: 83.1 kg    Examination:  General exam: NAD. Respiratory system: Coarse diffuse rhonchorous breath sounds.  Some scattered crackles.  No expiratory wheezing noted.  Fair air movement.  Speaking in full sentences. Cardiovascular system: Irregularly irregular.  No JVD.  No murmurs rubs or gallops.  No lower extremity edema. Gastrointestinal system: Abdomen is soft, nontender, nondistended, positive bowel sounds.  No rebound.  No guarding.  Central nervous system: Alert and oriented. No focal neurological deficits. Extremities: Symmetric 5 x 5 power. Skin: No rashes, lesions or ulcers Psychiatry: Judgement and insight appear fair. Mood & affect appropriate.     Data Reviewed: I have personally reviewed following labs and imaging studies  CBC: Recent Labs  Lab 01/05/22 1715 01/05/22 2027 01/06/22 0239 01/07/22 0332  WBC 1.6* 1.5* 1.3* 1.3*  NEUTROABS  --  1.2* 1.0* 1.2*  HGB 8.8* 8.2* 8.2* 8.9*  HCT 26.3* 23.2* 24.2* 24.8*  MCV 114.8* 111.5* 111.5* 110.2*  PLT 112* 182 95* 92*     Basic Metabolic Panel: Recent Labs  Lab 01/05/22 1715  01/05/22 2233 01/06/22 0239 01/07/22 0332  NA 128* 129* 131* 134*  K 3.9 3.7 4.0 4.0  CL 97* 97* 101 103  CO2 22 23 23  20*  GLUCOSE 164* 113* 115* 200*  BUN 26* 25* 23 21  CREATININE 1.52* 1.41* 1.27* 1.20  CALCIUM 8.5* 8.3* 8.1* 7.9*  MG 1.9  --  1.9 2.1  PHOS 3.1  --  3.5  --      GFR: Estimated Creatinine Clearance: 50.7 mL/min  (by C-G formula based on SCr of 1.2 mg/dL).  Liver Function Tests: Recent Labs  Lab 01/05/22 1715 01/06/22 0239 01/07/22 0332  AST 55* 47* 55*  ALT 52* 49* 65*  ALKPHOS 78 69 74  BILITOT 1.4* 1.1 1.0  PROT 6.6 5.8* 5.9*  ALBUMIN 3.7 3.2* 3.0*     CBG: Recent Labs  Lab 01/05/22 2152 01/06/22 0815 01/06/22 1136 01/06/22 1649  GLUCAP 118* 125* 105* 113*      Recent Results (from the past 240 hour(s))  Blood culture (routine x 2)     Status: None (Preliminary result)   Collection Time: 01/05/22  5:01 PM   Specimen: BLOOD  Result Value Ref Range Status   Specimen Description BLOOD SITE NOT SPECIFIED  Final   Special Requests   Final    BOTTLES DRAWN AEROBIC AND ANAEROBIC Blood Culture adequate volume   Culture   Final    NO GROWTH 2 DAYS Performed at [facility], [place] [address] [place], [place] [address]    Report Status PENDING  Incomplete  Resp Panel by RT-PCR (Flu A&B, Covid) Nasopharyngeal Swab     Status: None   Collection Time: 01/05/22  6:55 PM   Specimen: Nasopharyngeal Swab; Nasopharyngeal(NP) swabs in vial transport medium  Result Value Ref Range Status   SARS Coronavirus 2 by RT PCR NEGATIVE NEGATIVE Final    Comment: (NOTE) SARS-CoV-2 target nucleic acids are NOT DETECTED.  The SARS-CoV-2 RNA is generally detectable in upper respiratory specimens during the acute phase of infection. The lowest concentration of SARS-CoV-2 viral copies this assay can detect is 138 copies/mL. A negative result does not preclude SARS-Cov-2 infection and should not be used as the sole basis for treatment or other patient management decisions. A negative result may occur with  improper specimen collection/handling, submission of specimen other than nasopharyngeal swab, presence of viral mutation(s) within the areas targeted by this assay, and inadequate number of viral copies(<138 copies/mL). A negative result must be combined with clinical observations, patient  history, and epidemiological information. The expected result is Negative.  Fact Sheet for Patients:  EntrepreneurPulse.com.au  Fact Sheet for Healthcare Providers:  IncredibleEmployment.be  This test is no t yet approved or cleared by the [place] FDA and  has been authorized for detection and/or diagnosis of SARS-CoV-2 by FDA under an Emergency Use Authorization (EUA). This EUA will remain  in effect (meaning this test can be used) for the duration of the COVID-19 declaration under Section 564(b)(1) of the Act, 21 U.S.C.section 360bbb-3(b)(1), unless the authorization is terminated  or revoked sooner.       Influenza A by PCR NEGATIVE NEGATIVE Final   Influenza B by PCR NEGATIVE NEGATIVE Final    Comment: (NOTE) The Xpert Xpress SARS-CoV-2/FLU/RSV plus assay is intended as an aid in the diagnosis of influenza from Nasopharyngeal swab specimens and should not be used as a sole basis for treatment. Nasal washings and aspirates are unacceptable for Xpert Xpress SARS-CoV-2/FLU/RSV testing.  Fact Sheet for  Patients: EntrepreneurPulse.com.au  Fact Sheet for Healthcare Providers: IncredibleEmployment.be  This test is not yet approved or cleared by the [place] FDA and has been authorized for detection and/or diagnosis of SARS-CoV-2 by FDA under an Emergency Use Authorization (EUA). This EUA will remain in effect (meaning this test can be used) for the duration of the COVID-19 declaration under Section 564(b)(1) of the Act, 21 U.S.C. section 360bbb-3(b)(1), unless the authorization is terminated or revoked.  Performed at [facility], [place] [address] [place], [place] [address]   Respiratory (~20 pathogens) panel by PCR     Status: Abnormal   Collection Time: 01/05/22  6:55 PM   Specimen: Nasopharyngeal Swab; Respiratory  Result Value Ref Range Status   Adenovirus NOT DETECTED NOT DETECTED  Final   Coronavirus 229E NOT DETECTED NOT DETECTED Final    Comment: (NOTE) The Coronavirus on the Respiratory Panel, DOES NOT test for the novel  Coronavirus (2019 nCoV)    Coronavirus HKU1 NOT DETECTED NOT DETECTED Final   Coronavirus NL63 NOT DETECTED NOT DETECTED Final   Coronavirus OC43 NOT DETECTED NOT DETECTED Final   Metapneumovirus DETECTED (A) NOT DETECTED Final   Rhinovirus / Enterovirus NOT DETECTED NOT DETECTED Final   Influenza A NOT DETECTED NOT DETECTED Final   Influenza B NOT DETECTED NOT DETECTED Final   Parainfluenza Virus 1 NOT DETECTED NOT DETECTED Final   Parainfluenza Virus 2 NOT DETECTED NOT DETECTED Final   Parainfluenza Virus 3 NOT DETECTED NOT DETECTED Final   Parainfluenza Virus 4 NOT DETECTED NOT DETECTED Final   Respiratory Syncytial Virus NOT DETECTED NOT DETECTED Final   Bordetella pertussis NOT DETECTED NOT DETECTED Final   Bordetella Parapertussis NOT DETECTED NOT DETECTED Final   Chlamydophila pneumoniae NOT DETECTED NOT DETECTED Final   Mycoplasma pneumoniae NOT DETECTED NOT DETECTED Final    Comment: Performed at [facility], [address], [place], [place] [address]  Blood culture (routine x 2)     Status: None (Preliminary result)   Collection Time: 01/05/22 10:31 PM   Specimen: BLOOD  Result Value Ref Range Status   Specimen Description BLOOD LEFT ANTECUBITAL  Final   Special Requests   Final    BOTTLES DRAWN AEROBIC AND ANAEROBIC Blood Culture results may not be optimal due to an inadequate volume of blood received in culture bottles   Culture   Final    NO GROWTH 2 DAYS Performed at [facility], [place] [address], [place], [place] [address]    Report Status PENDING  Incomplete  Expectorated Sputum Assessment w Gram Stain, Rflx to Resp Cult     Status: None   Collection Time: 01/05/22 11:45 PM   Specimen: Sputum  Result Value Ref Range Status   Specimen Description SPUTUM  Final   Special Requests NONE  Final   Sputum  evaluation   Final    THIS SPECIMEN IS ACCEPTABLE FOR SPUTUM CULTURE Performed at [facility], [place] [address] [place], [place] [address]    Report Status 01/06/2022 FINAL  Final  Culture, Respiratory w Gram Stain     Status: None (Preliminary result)   Collection Time: 01/05/22 11:45 PM   Specimen: SPU  Result Value Ref Range Status   Specimen Description SPUTUM  Final   Special Requests NONE Reflexed from [id number]  Final   Gram Stain   Final    FEW SQUAMOUS EPITHELIAL CELLS PRESENT FEW GRAM POSITIVE COCCI IN PAIRS FEW [place] NEGATIVE RODS FEW  GRAM VARIABLE ROD    Culture   Final    CULTURE REINCUBATED FOR BETTER GROWTH Performed at [facility], [place] [address], [place], [place] [address]    Report Status PENDING  Incomplete          Radiology Studies: DG Chest Port 1 View  Result Date: 01/05/2022 CLINICAL DATA:  Shortness of breath, hypoxia EXAM: PORTABLE CHEST 1 VIEW COMPARISON:  Chest radiographs, 01/03/2022, CT chest abdomen pelvis, 09/16/2021 FINDINGS: Cardiomegaly. Diffuse bilateral interstitial pulmonary opacity, increased compared to prior. Relatively focal airspace opacity of the right midlung, similar to prior. IMPRESSION: 1. Cardiomegaly with diffuse bilateral interstitial pulmonary opacity, increased compared to prior and most consistent with edema. 2. Relatively focal airspace opacity of the right mid lung, which corresponds to a subpleural masslike opacity seen on prior CT. Electronically Signed   By: Delanna Ahmadi M.D.   On: 01/05/2022 18:11   ECHOCARDIOGRAM COMPLETE  Result Date: 01/06/2022    ECHOCARDIOGRAM REPORT   Patient Name:   ATHOL BOLDS Date of Exam: 01/06/2022 Medical Rec #:  [id number]       Height:       70.0 in Accession #:    [id number]      Weight:       183.3 lb Date of Birth:  [birth year]       BSA:          2.011 m Patient Age:    84 years        BP:           145/64 mmHg Patient Gender: M               HR:           86 bpm. Exam Location:   Inpatient Procedure: 2D Echo Indications:    Cardiomegaly  History:        Patient has prior history of Echocardiogram examinations, most                 recent 01/23/2016. Signs/Symptoms:Shortness of Breath; Risk                 Factors:Diabetes, Dyslipidemia and Hypertension.  Sonographer:    Arlyss Gandy Referring Phys: [place]  1. Left ventricular ejection fraction, by estimation, is 60 to 65%. The left ventricle has normal function. The left ventricle has no regional wall motion abnormalities. Left ventricular diastolic parameters are indeterminate.  2. Right ventricular systolic function is normal. The right ventricular size is normal. There is mildly elevated pulmonary artery systolic pressure. The estimated right ventricular systolic pressure is 12.2 mmHg.  3. Left atrial size was moderately dilated.  4. Right atrial size was mildly dilated.  5. The mitral valve is normal in structure. Trivial mitral valve regurgitation. No evidence of mitral stenosis.  6. The aortic valve is tricuspid. Aortic valve regurgitation is not visualized. No aortic stenosis is present.  7. The inferior vena cava is dilated in size with <50% respiratory variability, suggesting right atrial pressure of 15 mmHg.  8. The patient was in atrial fibrillation. FINDINGS  Left Ventricle: Left ventricular ejection fraction, by estimation, is 60 to 65%. The left ventricle has normal function. The left ventricle has no regional wall motion abnormalities. The left ventricular internal cavity size was normal in size. There is  no left ventricular hypertrophy. Left ventricular diastolic parameters are indeterminate. Right Ventricle: The right ventricular size is normal. No increase in right ventricular wall thickness. Right  ventricular systolic function is normal. There is mildly elevated pulmonary artery systolic pressure. The tricuspid regurgitant velocity is 2.61  m/s, and with an assumed right atrial pressure of 15  mmHg, the estimated right ventricular systolic pressure is 24.4 mmHg. Left Atrium: Left atrial size was moderately dilated. Right Atrium: Right atrial size was mildly dilated. Pericardium: There is no evidence of pericardial effusion. Mitral Valve: The mitral valve is normal in structure. Trivial mitral valve regurgitation. No evidence of mitral valve stenosis. Tricuspid Valve: The tricuspid valve is normal in structure. Tricuspid valve regurgitation is trivial. Aortic Valve: The aortic valve is tricuspid. Aortic valve regurgitation is not visualized. No aortic stenosis is present. Aortic valve mean gradient measures 8.0 mmHg. Aortic valve peak gradient measures 13.5 mmHg. Aortic valve area, by VTI measures 1.89  cm. Pulmonic Valve: The pulmonic valve was normal in structure. Pulmonic valve regurgitation is trivial. Aorta: The aortic root is normal in size and structure. Venous: The inferior vena cava is dilated in size with less than 50% respiratory variability, suggesting right atrial pressure of 15 mmHg. IAS/Shunts: No atrial level shunt detected by color flow Doppler.  LEFT VENTRICLE PLAX 2D LVIDd:         5.35 cm   Diastology LVIDs:         3.75 cm   LV e' medial:    12.80 cm/s LV PW:         1.02 cm   LV E/e' medial:  9.7 LV IVS:        1.03 cm   LV e' lateral:   15.10 cm/s LVOT diam:     2.00 cm   LV E/e' lateral: 8.2 LV SV:         75 LV SV Index:   37 LVOT Area:     3.14 cm  RIGHT VENTRICLE RV Basal diam:  3.83 cm RV Mid diam:    3.03 cm RV S prime:     14.60 cm/s TAPSE (M-mode): 1.3 cm LEFT ATRIUM           Index        RIGHT ATRIUM           Index LA diam:      4.20 cm 2.09 cm/m   RA Area:     22.00 cm LA Vol (A2C): 56.1 ml 27.89 ml/m  RA Volume:   64.60 ml  32.12 ml/m LA Vol (A4C): 89.4 ml 44.45 ml/m  AORTIC VALVE AV Area (Vmax):    2.12 cm AV Area (Vmean):   2.00 cm AV Area (VTI):     1.89 cm AV Vmax:           184.00 cm/s AV Vmean:          131.000 cm/s AV VTI:            0.397 m AV Peak  Grad:      13.5 mmHg AV Mean Grad:      8.0 mmHg LVOT Vmax:         124.00 cm/s LVOT Vmean:        83.200 cm/s LVOT VTI:          0.239 m LVOT/AV VTI ratio: 0.60  AORTA Ao Root diam: 3.20 cm Ao Asc diam:  3.20 cm MITRAL VALVE                TRICUSPID VALVE MV Area (PHT): 4.21 cm     TR Peak grad:   27.2 mmHg MV  Decel Time: 180 msec     TR Vmax:        261.00 cm/s MV E velocity: 124.00 cm/s                             SHUNTS                             Systemic VTI:  0.24 m                             Systemic Diam: 2.00 cm Dalton McleanMD Electronically signed by Franki Monte Signature Date/Time: 01/06/2022/4:14:17 PM    Final         Scheduled Meds:  alfuzosin  10 mg Oral QHS   amLODipine  10 mg Oral Daily   atorvastatin  20 mg Oral Daily   feeding supplement  237 mL Oral TID BM   finasteride  5 mg Oral QHS   fluticasone  2 spray Each Nare Daily   furosemide  20 mg Intravenous Once   ipratropium  0.5 mg Nebulization Q8H   levalbuterol  0.63 mg Nebulization Q8H   loratadine  10 mg Oral Daily   multivitamin with minerals  1 tablet Oral Daily   pantoprazole  40 mg Oral Q0600   rivaroxaban  20 mg Oral Q supper   sodium chloride flush  3 mL Intravenous Q12H   Continuous Infusions:  sodium chloride     azithromycin 500 mg (01/06/22 2019)   cefTRIAXone (ROCEPHIN)  IV 2 g (01/06/22 1807)     LOS: 2 days    Time spent: 40 minutes    Irine Seal, MD [facility]   To contact the attending provider between 7A-7P or the covering provider during after hours 7P-7A, please log into the web site [email or web address] and access using universal [place] password for that web site. If you do not have the password, please call the hospital operator.  01/07/2022, 12:41 PM

## 2022-01-07 NOTE — Progress Notes (Signed)
Critical Lab Value ?WBC 1.3  ?

## 2022-01-07 NOTE — Progress Notes (Signed)
Bladder scan done for 455cc urine.   ?

## 2022-01-07 NOTE — Discharge Planning (Signed)
Oncology Discharge Planning Admission Note ? ?[place] at [facility] ?Address: [address], [place], [place] [address] ?Hours of Operation:  8am - 5pm, Monday - Friday  ?Clinic Contact Information:  [phone] ? ?Oncology Care Team: ?Medical Oncologist:  Dr. Sullivan Lone ? ?Dr. Irene Limbo and Myrtha Mantis - NP are aware of this hospital admission dated 01/05/22 and have assessed patient at bedside.  The cancer center will follow [place] inpatient care to assist with discharge planning as indicated by the oncologist.  We will arrange for any necessary outpatient follow up prior to discharge. ? ?Disclaimer:  This [place] note does not imply a formal consult request has been made by the admitting attending for this admission or there will be an inpatient consult completed by oncology.  Please request oncology consults as per standard process as indicated. ?

## 2022-01-08 ENCOUNTER — Inpatient Hospital Stay (HOSPITAL_COMMUNITY): Payer: PPO

## 2022-01-08 DIAGNOSIS — W19XXXA Unspecified fall, initial encounter: Secondary | ICD-10-CM

## 2022-01-08 LAB — CBC WITH DIFFERENTIAL/PLATELET
Abs Immature Granulocytes: 0.01 10*3/uL (ref 0.00–0.07)
Basophils Absolute: 0 10*3/uL (ref 0.0–0.1)
Basophils Relative: 0 %
Eosinophils Absolute: 0 10*3/uL (ref 0.0–0.5)
Eosinophils Relative: 0 %
HCT: 23.5 % — ABNORMAL LOW (ref 39.0–52.0)
Hemoglobin: 8.3 g/dL — ABNORMAL LOW (ref 13.0–17.0)
Immature Granulocytes: 1 %
Lymphocytes Relative: 9 %
Lymphs Abs: 0.2 10*3/uL — ABNORMAL LOW (ref 0.7–4.0)
MCH: 38.8 pg — ABNORMAL HIGH (ref 26.0–34.0)
MCHC: 35.3 g/dL (ref 30.0–36.0)
MCV: 109.8 fL — ABNORMAL HIGH (ref 80.0–100.0)
Monocytes Absolute: 0 10*3/uL — ABNORMAL LOW (ref 0.1–1.0)
Monocytes Relative: 2 %
Neutro Abs: 1.6 10*3/uL — ABNORMAL LOW (ref 1.7–7.7)
Neutrophils Relative %: 88 %
Platelets: 96 10*3/uL — ABNORMAL LOW (ref 150–400)
RBC: 2.14 MIL/uL — ABNORMAL LOW (ref 4.22–5.81)
RDW: 14.4 % (ref 11.5–15.5)
WBC: 1.8 10*3/uL — ABNORMAL LOW (ref 4.0–10.5)
nRBC: 0 % (ref 0.0–0.2)

## 2022-01-08 LAB — COMPREHENSIVE METABOLIC PANEL
ALT: 75 U/L — ABNORMAL HIGH (ref 0–44)
AST: 61 U/L — ABNORMAL HIGH (ref 15–41)
Albumin: 2.9 g/dL — ABNORMAL LOW (ref 3.5–5.0)
Alkaline Phosphatase: 72 U/L (ref 38–126)
Anion gap: 7 (ref 5–15)
BUN: 28 mg/dL — ABNORMAL HIGH (ref 8–23)
CO2: 23 mmol/L (ref 22–32)
Calcium: 8 mg/dL — ABNORMAL LOW (ref 8.9–10.3)
Chloride: 103 mmol/L (ref 98–111)
Creatinine, Ser: 1.2 mg/dL (ref 0.61–1.24)
GFR, Estimated: >60 mL/min (ref >60)
Glucose, Bld: 178 mg/dL — ABNORMAL HIGH (ref 70–99)
Potassium: 4 mmol/L (ref 3.5–5.1)
Sodium: 133 mmol/L — ABNORMAL LOW (ref 135–145)
Total Bilirubin: 0.6 mg/dL (ref 0.3–1.2)
Total Protein: 5.5 g/dL — ABNORMAL LOW (ref 6.5–8.1)

## 2022-01-08 LAB — CULTURE, RESPIRATORY W GRAM STAIN: Culture: NORMAL

## 2022-01-08 LAB — MAGNESIUM: Magnesium: 2.3 mg/dL (ref 1.7–2.4)

## 2022-01-08 MED ORDER — SENNOSIDES-DOCUSATE SODIUM 8.6-50 MG PO TABS
1.0000 | ORAL_TABLET | Freq: Every evening | ORAL | Status: DC | PRN
  Administered 2022-01-08 – 2022-01-09 (×2): 1 via ORAL
  Filled 2022-01-08 (×2): qty 1

## 2022-01-08 MED ORDER — IPRATROPIUM BROMIDE 0.02 % IN SOLN
0.5000 mg | Freq: Two times a day (BID) | RESPIRATORY_TRACT | Status: DC
  Administered 2022-01-08 – 2022-01-10 (×4): 0.5 mg via RESPIRATORY_TRACT
  Filled 2022-01-08 (×4): qty 2.5

## 2022-01-08 MED ORDER — POLYETHYLENE GLYCOL 3350 17 G PO PACK
17.0000 g | PACK | Freq: Three times a day (TID) | ORAL | Status: DC
  Administered 2022-01-08 – 2022-01-10 (×5): 17 g via ORAL
  Filled 2022-01-08 (×5): qty 1

## 2022-01-08 MED ORDER — LEVALBUTEROL HCL 0.63 MG/3ML IN NEBU
0.6300 mg | INHALATION_SOLUTION | Freq: Two times a day (BID) | RESPIRATORY_TRACT | Status: DC
  Administered 2022-01-08 – 2022-01-10 (×4): 0.63 mg via RESPIRATORY_TRACT
  Filled 2022-01-08 (×4): qty 3

## 2022-01-08 NOTE — Progress Notes (Signed)
PROGRESS NOTE    James Sweeney  ZOX:[id number] DOB: June 30, 1941 DOA: 01/05/2022 PCP: Lois Huxley, PA   Brief Narrative:  Patient is a pleasant 81 year old gentleman, hard of hearing, history of longstanding persistent atrial fibrillation,MALT lymphoma, type 2 diabetes, BPH, hyperlipidemia, hypertension, recurrent pneumonia presented to the ED with cough, chills, shortness of breath, wheezing and notable hypoxia. Patient placed empirically on IV antibiotics of Rocephin and azithromycin to cover for community-acquired pneumonia.   Assessment & Plan:   Principal Problem:   CAP (community acquired pneumonia) Active Problems:   DM (diabetes mellitus), type 2 ([place])   Acquired thrombophilia ([place])   Essential hypertension   Thrombocytopenia (HCC)   Hypercholesterolemia   Permanent atrial fibrillation (HCC)   Marginal zone lymphoma (HCC)   Anemia   AKI (acute kidney injury) ([place])   Hyponatremia   Chronic diastolic CHF (congestive heart failure) (HCC)   Cardiomegaly   Acute respiratory failure with hypoxia (HCC)   Pancytopenia (HCC)  Acute respiratory failure with hypoxia likely secondary to probable community-acquired pneumonia/metapneumovirus, POA -Viral panel positive for metapneumovirus, will cover for community-acquired pneumonia as well with azithromycin and ceftriaxone -Questionable immunocompromise state given below with pancytopenia at intake  -Received Lasix x3 at intake -hold further dosing  Mechanical fall, POA, not reported  -Patient unfortunately had a mechanical fall out of a truck while attempting to move a lawnmower prior to admission, did not discuss this with ED or admission team -Without midline tenderness but decreased range of motion, CT head and neck without contrast ordered and pending  Hyponatremia -Status post fluids and subsequent Lasix -hold further IV fluids or diuretics at this point follow repeat labs  Acute kidney injury on CKD stage IIIa -Status  post IV fluids and subsequent Lasix, currently within normal limits  Questionable diet controlled diabetes mellitus type 2 -Hemoglobin A1c 5.3 (01/05/2022) -family indicates he has prediabetes no formal diagnosis of diabetes -Discontinued CBGs and sliding scale insulin.  Permanent atrial fibrillation -Currently rate controlled. -Continue Xarelto for anticoagulation. -Hold Xarelto if platelet count <50,000, no significant bleed currently  Pancytopenia -Likely acute exacerbation in the setting of above infection and stressors -Chronic anemia of chronic disease, stable without any bleeding -Thrombocytopenia and leukopenia in the setting of multiple lymphoma with questionable history of bone marrow involvement -follow outpatient oncology - HemeOnc following outpatient recommending bone marrow biopsy but patient is not interested in further work-up at this time  Hyperlipidemia -Continue statin  Chronic diastolic CHF, without acute exacerbation - Strict I's and O's, daily weights, monitor urine output -remove Foley -Continue to hold IV fluids and diuretics  Marginal zone lymphoma/MALT lymphoma -Follow outpatient oncology as scheduled  Urinary retention -Continue home regimen Proscar, uroxatral -discontinue Foley catheter follow urinary output, if continues to have difficulties will discharge with Foley catheter for outpatient follow-up  DVT prophylaxis: Xarelto Code Status: Full Family Communication: At bedside  Status is: Inpatient  Dispo: The patient is from: Home              Anticipated d/c is to: To be determined              Anticipated d/c date is: 48 to 72 hours pending clinical course              Patient currently not medically stable for discharge  Consultants:  -None  Procedures:  None  Antimicrobials:  Azithromycin ceftriaxone  Subjective: No acute issues or events overnight denies nausea vomiting diarrhea constipation headache fevers chills or chest  pain  Objective: Vitals:   01/07/22 2034 01/07/22 2204 01/08/22 0500 01/08/22 0807  BP:      Pulse:      Resp: (!) 22 18    Temp:  98 F (36.7 C)    TempSrc:  Oral    SpO2: 91%   94%  Weight:   82.4 kg   Height:        Intake/Output Summary (Last 24 hours) at 01/08/2022 0814 Last data filed at 01/08/2022 0500 Gross per 24 hour  Intake --  Output 1600 ml  Net -1600 ml   Filed Weights   01/05/22 2146 01/08/22 0500  Weight: 83.1 kg 82.4 kg    Examination:  General:  Pleasantly resting in bed, No acute distress. Lungs: Without overt rhonchi wheezes or rales. Heart:  Regular rate and rhythm.  Without murmurs, rubs, or gallops. Abdomen:  Soft, nontender, nondistended.  Without guarding or rebound.  Data Reviewed: I have personally reviewed following labs and imaging studies  CBC: Recent Labs  Lab 01/05/22 1715 01/05/22 2027 01/06/22 0239 01/07/22 0332 01/08/22 0308  WBC 1.6* 1.5* 1.3* 1.3* 1.8*  NEUTROABS  --  1.2* 1.0* 1.2* 1.6*  HGB 8.8* 8.2* 8.2* 8.9* 8.3*  HCT 26.3* 23.2* 24.2* 24.8* 23.5*  MCV 114.8* 111.5* 111.5* 110.2* 109.8*  PLT 112* 182 95* 92* 96*   Basic Metabolic Panel: Recent Labs  Lab 01/05/22 1715 01/05/22 2233 01/06/22 0239 01/07/22 0332 01/08/22 0308  NA 128* 129* 131* 134* 133*  K 3.9 3.7 4.0 4.0 4.0  CL 97* 97* 101 103 103  CO2 _0 20* 23  GLUCOSE 164* 113* 115* 200* 178*  BUN 26* 25* 23 21 28*  CREATININE 1.52* 1.41* 1.27* 1.20 1.20  CALCIUM 8.5* 8.3* 8.1* 7.9* 8.0*  MG 1.9  --  1.9 2.1 2.3  PHOS 3.1  --  3.5  --   --    GFR: Estimated Creatinine Clearance: 50.7 mL/min (by C-G formula based on SCr of 1.2 mg/dL). Liver Function Tests: Recent Labs  Lab 01/05/22 1715 01/06/22 0239 01/07/22 0332 01/08/22 0308  AST 55* 47* 55* 61*  ALT 52* 49* 65* 75*  ALKPHOS 78 69 74 72  BILITOT 1.4* 1.1 1.0 0.6  PROT 6.6 5.8* 5.9* 5.5*  ALBUMIN 3.7 3.2* 3.0* 2.9*   No results for input(s): LIPASE, AMYLASE in the last 168 hours. No  results for input(s): AMMONIA in the last 168 hours. Coagulation Profile: Recent Labs  Lab 01/05/22 1901  INR 2.3*   Cardiac Enzymes: Recent Labs  Lab 01/05/22 1715  CKTOTAL 142   BNP (last 3 results) No results for input(s): PROBNP in the last 8760 hours. HbA1C: Recent Labs    01/05/22 2027  HGBA1C 5.3   CBG: Recent Labs  Lab 01/05/22 2152 01/06/22 0815 01/06/22 1136 01/06/22 1649  GLUCAP 118* 125* 105* 113*   Lipid Profile: No results for input(s): CHOL, HDL, LDLCALC, TRIG, CHOLHDL, LDLDIRECT in the last 72 hours. Thyroid Function Tests: Recent Labs    01/06/22 0239  TSH 1.255   Anemia Panel: Recent Labs    01/05/22 2027  VITAMINB12 1,317*  FOLATE 63.9  FERRITIN 421*  TIBC 160*  IRON 144  RETICCTPCT 1.6   Sepsis Labs: Recent Labs  Lab 01/05/22 1715 01/05/22 1830 01/05/22 2027 01/06/22 0239 01/07/22 0332  PROCALCITON 0.17  --   --  0.15 0.11  LATICACIDVEN  --  1.3 1.2  --   --     Recent Results (from  the past 240 hour(s))  Blood culture (routine x 2)     Status: None (Preliminary result)   Collection Time: 01/05/22  5:01 PM   Specimen: BLOOD  Result Value Ref Range Status   Specimen Description BLOOD SITE NOT SPECIFIED  Final   Special Requests   Final    BOTTLES DRAWN AEROBIC AND ANAEROBIC Blood Culture adequate volume   Culture   Final    NO GROWTH 2 DAYS Performed at [facility], [address] [place], [place] [address]    Report Status PENDING  Incomplete  Resp Panel by RT-PCR (Flu A&B, Covid) Nasopharyngeal Swab     Status: None   Collection Time: 01/05/22  6:55 PM   Specimen: Nasopharyngeal Swab; Nasopharyngeal(NP) swabs in vial transport medium  Result Value Ref Range Status   SARS Coronavirus 2 by RT PCR NEGATIVE NEGATIVE Final    Comment: (NOTE) SARS-CoV-2 target nucleic acids are NOT DETECTED.  The SARS-CoV-2 RNA is generally detectable in upper respiratory specimens during the acute phase of infection. The  lowest concentration of SARS-CoV-2 viral copies this assay can detect is 138 copies/mL. A negative result does not preclude SARS-Cov-2 infection and should not be used as the sole basis for treatment or other patient management decisions. A negative result may occur with  improper specimen collection/handling, submission of specimen other than nasopharyngeal swab, presence of viral mutation(s) within the areas targeted by this assay, and inadequate number of viral copies(<138 copies/mL). A negative result must be combined with clinical observations, patient history, and epidemiological information. The expected result is Negative.  Fact Sheet for Patients:  EntrepreneurPulse.com.au  Fact Sheet for Healthcare Providers:  IncredibleEmployment.be  This test is no t yet approved or cleared by the [place] FDA and  has been authorized for detection and/or diagnosis of SARS-CoV-2 by FDA under an Emergency Use Authorization (EUA). This EUA will remain  in effect (meaning this test can be used) for the duration of the COVID-19 declaration under Section 564(b)(1) of the Act, 21 U.S.C.section 360bbb-3(b)(1), unless the authorization is terminated  or revoked sooner.       Influenza A by PCR NEGATIVE NEGATIVE Final   Influenza B by PCR NEGATIVE NEGATIVE Final    Comment: (NOTE) The Xpert Xpress SARS-CoV-2/FLU/RSV plus assay is intended as an aid in the diagnosis of influenza from Nasopharyngeal swab specimens and should not be used as a sole basis for treatment. Nasal washings and aspirates are unacceptable for Xpert Xpress SARS-CoV-2/FLU/RSV testing.  Fact Sheet for Patients: EntrepreneurPulse.com.au  Fact Sheet for Healthcare Providers: IncredibleEmployment.be  This test is not yet approved or cleared by the [place] FDA and has been authorized for detection and/or diagnosis of SARS-CoV-2 by FDA under  an Emergency Use Authorization (EUA). This EUA will remain in effect (meaning this test can be used) for the duration of the COVID-19 declaration under Section 564(b)(1) of the Act, 21 U.S.C. section 360bbb-3(b)(1), unless the authorization is terminated or revoked.  Performed at [facility], [place] [address] [place], [place] [address]   Respiratory (~20 pathogens) panel by PCR     Status: Abnormal   Collection Time: 01/05/22  6:55 PM   Specimen: Nasopharyngeal Swab; Respiratory  Result Value Ref Range Status   Adenovirus NOT DETECTED NOT DETECTED Final   Coronavirus 229E NOT DETECTED NOT DETECTED Final    Comment: (NOTE) The Coronavirus on the Respiratory Panel, DOES NOT test for the novel  Coronavirus (2019 nCoV)  Coronavirus HKU1 NOT DETECTED NOT DETECTED Final   Coronavirus NL63 NOT DETECTED NOT DETECTED Final   Coronavirus OC43 NOT DETECTED NOT DETECTED Final   Metapneumovirus DETECTED (A) NOT DETECTED Final   Rhinovirus / Enterovirus NOT DETECTED NOT DETECTED Final   Influenza A NOT DETECTED NOT DETECTED Final   Influenza B NOT DETECTED NOT DETECTED Final   Parainfluenza Virus 1 NOT DETECTED NOT DETECTED Final   Parainfluenza Virus 2 NOT DETECTED NOT DETECTED Final   Parainfluenza Virus 3 NOT DETECTED NOT DETECTED Final   Parainfluenza Virus 4 NOT DETECTED NOT DETECTED Final   Respiratory Syncytial Virus NOT DETECTED NOT DETECTED Final   Bordetella pertussis NOT DETECTED NOT DETECTED Final   Bordetella Parapertussis NOT DETECTED NOT DETECTED Final   Chlamydophila pneumoniae NOT DETECTED NOT DETECTED Final   Mycoplasma pneumoniae NOT DETECTED NOT DETECTED Final    Comment: Performed at [facility], [place] [address], [place], [place] [address]  Blood culture (routine x 2)     Status: None (Preliminary result)   Collection Time: 01/05/22 10:31 PM   Specimen: BLOOD  Result Value Ref Range Status   Specimen Description BLOOD LEFT ANTECUBITAL  Final   Special  Requests   Final    BOTTLES DRAWN AEROBIC AND ANAEROBIC Blood Culture results may not be optimal due to an inadequate volume of blood received in culture bottles   Culture   Final    NO GROWTH 2 DAYS Performed at [facility], [place] [address], [place], [place] [address]    Report Status PENDING  Incomplete  Expectorated Sputum Assessment w Gram Stain, Rflx to Resp Cult     Status: None   Collection Time: 01/05/22 11:45 PM   Specimen: Sputum  Result Value Ref Range Status   Specimen Description SPUTUM  Final   Special Requests NONE  Final   Sputum evaluation   Final    THIS SPECIMEN IS ACCEPTABLE FOR SPUTUM CULTURE Performed at [facility], [place] [address], [place], [place] [address]    Report Status 01/06/2022 FINAL  Final  Culture, Respiratory w Gram Stain     Status: None (Preliminary result)   Collection Time: 01/05/22 11:45 PM   Specimen: SPU  Result Value Ref Range Status   Specimen Description SPUTUM  Final   Special Requests NONE Reflexed from [id number]  Final   Gram Stain   Final    FEW SQUAMOUS EPITHELIAL CELLS PRESENT FEW GRAM POSITIVE COCCI IN PAIRS FEW GRAM NEGATIVE RODS FEW GRAM VARIABLE ROD    Culture   Final    CULTURE REINCUBATED FOR BETTER GROWTH Performed at [facility], [place] [address], [place], [place] [address]    Report Status PENDING  Incomplete         Radiology Studies: ECHOCARDIOGRAM COMPLETE  Result Date: 01/06/2022    ECHOCARDIOGRAM REPORT   Patient Name:   James Sweeney Date of Exam: 01/06/2022 Medical Rec #:  [id number]       Height:       70.0 in Accession #:    [id number]      Weight:       183.3 lb Date of Birth:  Nov 17, 1940       BSA:          2.011 m Patient Age:    54 years        BP:           145/64 mmHg Patient Gender: M  HR:           86 bpm. Exam Location:  Inpatient Procedure: 2D Echo Indications:    Cardiomegaly  History:        Patient has prior history of Echocardiogram examinations, most                  recent 01/23/2016. Signs/Symptoms:Shortness of Breath; Risk                 Factors:Diabetes, Dyslipidemia and Hypertension.  Sonographer:    Arlyss Gandy Referring Phys: [place]  1. Left ventricular ejection fraction, by estimation, is 60 to 65%. The left ventricle has normal function. The left ventricle has no regional wall motion abnormalities. Left ventricular diastolic parameters are indeterminate.  2. Right ventricular systolic function is normal. The right ventricular size is normal. There is mildly elevated pulmonary artery systolic pressure. The estimated right ventricular systolic pressure is 23.3 mmHg.  3. Left atrial size was moderately dilated.  4. Right atrial size was mildly dilated.  5. The mitral valve is normal in structure. Trivial mitral valve regurgitation. No evidence of mitral stenosis.  6. The aortic valve is tricuspid. Aortic valve regurgitation is not visualized. No aortic stenosis is present.  7. The inferior vena cava is dilated in size with <50% respiratory variability, suggesting right atrial pressure of 15 mmHg.  8. The patient was in atrial fibrillation. FINDINGS  Left Ventricle: Left ventricular ejection fraction, by estimation, is 60 to 65%. The left ventricle has normal function. The left ventricle has no regional wall motion abnormalities. The left ventricular internal cavity size was normal in size. There is  no left ventricular hypertrophy. Left ventricular diastolic parameters are indeterminate. Right Ventricle: The right ventricular size is normal. No increase in right ventricular wall thickness. Right ventricular systolic function is normal. There is mildly elevated pulmonary artery systolic pressure. The tricuspid regurgitant velocity is 2.61  m/s, and with an assumed right atrial pressure of 15 mmHg, the estimated right ventricular systolic pressure is 00.7 mmHg. Left Atrium: Left atrial size was moderately dilated. Right Atrium: Right atrial  size was mildly dilated. Pericardium: There is no evidence of pericardial effusion. Mitral Valve: The mitral valve is normal in structure. Trivial mitral valve regurgitation. No evidence of mitral valve stenosis. Tricuspid Valve: The tricuspid valve is normal in structure. Tricuspid valve regurgitation is trivial. Aortic Valve: The aortic valve is tricuspid. Aortic valve regurgitation is not visualized. No aortic stenosis is present. Aortic valve mean gradient measures 8.0 mmHg. Aortic valve peak gradient measures 13.5 mmHg. Aortic valve area, by VTI measures 1.89  cm. Pulmonic Valve: The pulmonic valve was normal in structure. Pulmonic valve regurgitation is trivial. Aorta: The aortic root is normal in size and structure. Venous: The inferior vena cava is dilated in size with less than 50% respiratory variability, suggesting right atrial pressure of 15 mmHg. IAS/Shunts: No atrial level shunt detected by color flow Doppler.  LEFT VENTRICLE PLAX 2D LVIDd:         5.35 cm   Diastology LVIDs:         3.75 cm   LV e' medial:    12.80 cm/s LV PW:         1.02 cm   LV E/e' medial:  9.7 LV IVS:        1.03 cm   LV e' lateral:   15.10 cm/s LVOT diam:     2.00 cm   LV E/e' lateral: 8.2 LV  SV:         75 LV SV Index:   37 LVOT Area:     3.14 cm  RIGHT VENTRICLE RV Basal diam:  3.83 cm RV Mid diam:    3.03 cm RV S prime:     14.60 cm/s TAPSE (M-mode): 1.3 cm LEFT ATRIUM           Index        RIGHT ATRIUM           Index LA diam:      4.20 cm 2.09 cm/m   RA Area:     22.00 cm LA Vol (A2C): 56.1 ml 27.89 ml/m  RA Volume:   64.60 ml  32.12 ml/m LA Vol (A4C): 89.4 ml 44.45 ml/m  AORTIC VALVE AV Area (Vmax):    2.12 cm AV Area (Vmean):   2.00 cm AV Area (VTI):     1.89 cm AV Vmax:           184.00 cm/s AV Vmean:          131.000 cm/s AV VTI:            0.397 m AV Peak Grad:      13.5 mmHg AV Mean Grad:      8.0 mmHg LVOT Vmax:         124.00 cm/s LVOT Vmean:        83.200 cm/s LVOT VTI:          0.239 m LVOT/AV VTI  ratio: 0.60  AORTA Ao Root diam: 3.20 cm Ao Asc diam:  3.20 cm MITRAL VALVE                TRICUSPID VALVE MV Area (PHT): 4.21 cm     TR Peak grad:   27.2 mmHg MV Decel Time: 180 msec     TR Vmax:        261.00 cm/s MV E velocity: 124.00 cm/s                             SHUNTS                             Systemic VTI:  0.24 m                             Systemic Diam: 2.00 cm Dalton McleanMD Electronically signed by Franki Monte Signature Date/Time: 01/06/2022/4:14:17 PM    Final         Scheduled Meds:  alfuzosin  10 mg Oral QHS   amLODipine  10 mg Oral Daily   atorvastatin  20 mg Oral Daily   Chlorhexidine Gluconate Cloth  6 each Topical Q0600   feeding supplement  237 mL Oral TID BM   finasteride  5 mg Oral QHS   fluticasone  2 spray Each Nare Daily   ipratropium  0.5 mg Nebulization TID   levalbuterol  0.63 mg Nebulization TID   loratadine  10 mg Oral Daily   multivitamin with minerals  1 tablet Oral Daily   pantoprazole  40 mg Oral Q0600   rivaroxaban  20 mg Oral Q supper   sodium chloride flush  3 mL Intravenous Q12H   traMADol  25 mg Oral BID   Continuous Infusions:  sodium chloride     azithromycin 500 mg (01/07/22 1854)  cefTRIAXone (ROCEPHIN)  IV 2 g (01/07/22 1733)     LOS: 3 days   Time spent: 37mn  Kemar Pandit C Galia Rahm, DO [facility]  If 7PM-7AM, please contact night-coverage [email or web address]  01/08/2022, 8:14 AM

## 2022-01-08 NOTE — Progress Notes (Signed)
OT Cancellation Note ? ?Patient Details ?Name: James Sweeney ?MRN: [id number] ?DOB: October 10, 1941 ? ? ?Cancelled Treatment:    Reason Eval/Treat Not Completed: Fatigue/lethargy limiting ability to participate (Family would like a 3 in 1 and rollator for home, will return when pt is awake.) ? ?[name] ?01/08/2022, 10:03 AM ?Nestor Lewandowsky, OTR/L ?Acute Rehabilitation Services ?Pager: [phone] ?Office: [phone]  ?

## 2022-01-08 NOTE — Evaluation (Signed)
Occupational Therapy Evaluation ?Patient Details ?Name: James Sweeney ?MRN: [id number] ?DOB: 17-Jul-1941 ?Today's Date: 01/08/2022 ? ? ?History of Present Illness Patient is a 81 y/o male who presents on 01/05/22 with SOB, cough. Found to have acute respiratory failure with hypoxia and CAP. Diagnosed with PNA recently, but did not finish antibiotics. PMH includes HOH, DM, HTN, persistent atrial fibrillation, MALT lymphoma.  ? ?Clinical Impression ?  ?Pt was independent prior to admission and living with his wife who has dementia. Grandson plans to move in with the couple. Pt presents with generalized weakness, impaired standing balance and decreased activity tolerance. Sp02 remained at 94% or above on 2L 02 throughout session. Pt demonstrated ability to perform bed mobility with supervision, stand and walk with RW with min guard assist. He needs up to min guard assist for ADLs. Per grandson, pt has made marked improvement since yesterday.  ?   ? ?Recommendations for follow up therapy are one component of a multi-disciplinary discharge planning process, led by the attending physician.  Recommendations may be updated based on patient status, additional functional criteria and insurance authorization.  ? ?Follow Up Recommendations ? Home health OT  ?  ?Assistance Recommended at Discharge Frequent or constant Supervision/Assistance  ?Patient can return home with the following A little help with walking and/or transfers;A little help with bathing/dressing/bathroom;Assistance with cooking/housework;Assist for transportation;Help with stairs or ramp for entrance ? ?  ?Functional Status Assessment ? Patient has had a recent decline in their functional status and demonstrates the ability to make significant improvements in function in a reasonable and predictable amount of time.  ?Equipment Recommendations ? BSC/3in1;Other (comment) (family asking for rollator)  ?  ?Recommendations for Other Services   ? ? ?  ?Precautions /  Restrictions Precautions ?Precautions: Fall ?Precaution Comments: watch 02  ? ?  ? ?Mobility Bed Mobility ?Overal bed mobility: Needs Assistance ?Bed Mobility: Supine to Sit, Sit to Supine ?  ?  ?Supine to sit: Supervision, HOB elevated ?Sit to supine: Supervision ?  ?  ?  ? ?Transfers ?Overall transfer level: Needs assistance ?Equipment used: Rolling walker (2 wheels) ?Transfers: Sit to/from Stand ?Sit to Stand: Min guard, From elevated surface ?  ?  ?  ?  ?  ?General transfer comment: cues for hand placement, elevated bed height slightly ?  ? ?  ?Balance Overall balance assessment: Needs assistance ?  ?Sitting balance-Leahy Scale: Good ?Sitting balance - Comments: no LOB donning socks ?  ?Standing balance support: During functional activity, Reliant on assistive device for balance ?Standing balance-Leahy Scale: Poor ?  ?  ?  ?  ?  ?  ?  ?  ?  ?  ?  ?  ?   ? ?ADL either performed or assessed with clinical judgement  ? ?ADL Overall ADL's : Needs assistance/impaired ?Eating/Feeding: Independent ?  ?Grooming: Min guard;Standing;Wash/dry hands ?  ?Upper Body Bathing: Supervision/ safety;Sitting ?  ?Lower Body Bathing: Min guard;Sit to/from stand ?  ?Upper Body Dressing : Set up;Sitting ?  ?Lower Body Dressing: Sitting/lateral leans;Supervision/safety ?  ?Toilet Transfer: Min guard;Ambulation;BSC/3in1;Rolling walker (2 wheels) ?  ?Toileting- Clothing Manipulation and Hygiene: Sit to/from stand;Minimal assistance ?  ?  ?  ?Functional mobility during ADLs: Min guard;Rolling walker (2 wheels) ?   ? ? ? ?Vision Ability to See in Adequate Light: 0 Adequate ?Patient Visual Report: No change from baseline ?   ?   ?Perception   ?  ?Praxis   ?  ? ?Pertinent Vitals/Pain Pain Assessment ?Pain  Assessment: No/denies pain  ? ? ? ?Hand Dominance Right ?  ?Extremity/Trunk Assessment Upper Extremity Assessment ?Upper Extremity Assessment: Generalized weakness (very strong) ?  ?Lower Extremity Assessment ?Lower Extremity Assessment:  Defer to PT evaluation ?  ?Cervical / Trunk Assessment ?Cervical / Trunk Assessment: Other exceptions ?Cervical / Trunk Exceptions: hx of chronic back pain ?  ?Communication Communication ?Communication: HOH ?  ?Cognition Arousal/Alertness: Awake/alert ?Behavior During Therapy: [facility] for tasks assessed/performed ?Overall Cognitive Status: Difficult to assess ?  ?  ?  ?  ?  ?  ?  ?  ?  ?  ?  ?  ?  ?  ?  ?  ?General Comments: grandson "interprets" pt does not admit when he doesn't hear correctly ?  ?  ?General Comments    ? ?  ?Exercises   ?  ?Shoulder Instructions    ? ? ?Home Living Family/patient expects to be discharged to:: Private residence ?Living Arrangements: Spouse/significant other ?Available Help at Discharge: Family;Available 24 hours/day ?Type of Home: House ?Home Access: Stairs to enter ?Entrance Stairs-Number of Steps: 1 flight vs ramp in back of house ?  ?Home Layout: One level ?  ?  ?Bathroom Shower/Tub: Tub/shower unit ?  ?Bathroom Toilet: Standard (jacuzzi tub with steps) ?  ?  ?Home Equipment: Conservation officer, nature (2 wheels) ?  ?  ?  ? ?  ?Prior Functioning/Environment Prior Level of Function : Independent/Modified Independent ?  ?  ?  ?  ?  ?  ?  ?ADLs Comments: independent, sponge baths ?  ? ?  ?  ?OT Problem List: Decreased activity tolerance;Impaired balance (sitting and/or standing);Decreased knowledge of use of DME or AE;Cardiopulmonary status limiting activity ?  ?   ?OT Treatment/Interventions: Self-care/ADL training;Energy conservation;DME and/or AE instruction;Patient/family education;Balance training;Therapeutic activities  ?  ?OT Goals(Current goals can be found in the care plan section) Acute Rehab OT Goals ?OT Goal Formulation: With patient ?Time For Goal Achievement: 01/22/22 ?Potential to Achieve Goals: Good ?ADL Goals ?Pt Will Perform Grooming: with supervision;standing (3 activities) ?Pt Will Perform Lower Body Bathing: with supervision;sit to/from stand ?Pt Will Perform Lower Body  Dressing: with supervision;sit to/from stand ?Pt Will Transfer to Toilet: with supervision;ambulating;bedside commode (over toilet as needed) ?Pt Will Perform Toileting - Clothing Manipulation and hygiene: with supervision;sit to/from stand ?Additional ADL Goal #1: Pt will generalize energy conservation and breathing techniques in ADLs and mobility.  ?OT Frequency: Min 2X/week ?  ? ?Co-evaluation   ?  ?  ?  ?  ? ?  ?AM-PAC OT "6 Clicks" Daily Activity     ?Outcome Measure Help from another person eating meals?: None ?Help from another person taking care of personal grooming?: A Little ?Help from another person toileting, which includes using toliet, bedpan, or urinal?: A Little ?Help from another person bathing (including washing, rinsing, drying)?: A Little ?Help from another person to put on and taking off regular upper body clothing?: A Little ?Help from another person to put on and taking off regular lower body clothing?: A Little ?6 Click Score: 19 ?  ?End of Session Equipment Utilized During Treatment: Rolling walker (2 wheels);Oxygen;Gait belt ? ?Activity Tolerance: Patient tolerated treatment well ?Patient left: with call bell/phone within reach;in bed;with family/visitor present ? ?OT Visit Diagnosis: Unsteadiness on feet (R26.81);Other abnormalities of gait and mobility (R26.89)  ?              ?Time: 6834-1962 ?OT Time Calculation (min): 27 min ?Charges:  OT General Charges ?$OT Visit: 1  Visit ?OT Evaluation ?$OT Eval Moderate Complexity: 1 Mod ?OT Treatments ?$Self Care/Home Management : 8-22 mins ? ?Nestor Lewandowsky, OTR/L ?Acute Rehabilitation Services ?Pager: [phone] ?Office: [phone]  ? ?[name] ?01/08/2022, 3:24 PM ?

## 2022-01-08 NOTE — Progress Notes (Signed)
Physical Therapy Treatment ?Patient Details ?Name: James Sweeney ?MRN: [id number] ?DOB: [birth year] ?Today's Date: 01/08/2022 ? ? ?History of Present Illness Patient is a 81 y/o male who presents on 01/05/22 with SOB, cough. Found to have acute respiratory failure with hypoxia and CAP. Diagnosed with PNA recently, but did not finish antibiotics. PMH includes HOH, DM, HTN, persistent atrial fibrillation, MALT lymphoma. ? ?  ?PT Comments  ? ? Pt received in supine, agreeable to therapy session and with good participation and tolerance for gait progression greater than household distance with RW support. Pt SpO2 WFL on 1L O2 [place] and VSS. Pt needing up to Supervision/safety cues for transfers and RW use. Pt continues to benefit from PT services to progress toward functional mobility goals.    ?Recommendations for follow up therapy are one component of a multi-disciplinary discharge planning process, led by the attending physician.  Recommendations may be updated based on patient status, additional functional criteria and insurance authorization. ? ?Follow Up Recommendations ? Home health PT ?  ?  ?Assistance Recommended at Discharge Intermittent Supervision/Assistance  ?Patient can return home with the following A little help with walking and/or transfers;A little help with bathing/dressing/bathroom;Assistance with cooking/housework;Assist for transportation;Help with stairs or ramp for entrance ?  ?Equipment Recommendations ? BSC/3in1  ?  ?Recommendations for Other Services   ? ? ?  ?Precautions / Restrictions Precautions ?Precautions: Fall ?Precaution Comments: watch 02; very HoH ?Restrictions ?Weight Bearing Restrictions: No  ?  ? ?Mobility ? Bed Mobility ?Overal bed mobility: Needs Assistance ?Bed Mobility: Supine to Sit, Sit to Supine ?  ?  ?Supine to sit: Supervision, HOB elevated ?Sit to supine: Supervision ?  ?General bed mobility comments: use of bed features/rails; pt also able to pull himself to long sit modI in  bed ?  ? ?Transfers ?Overall transfer level: Needs assistance ?Equipment used: Rolling walker (2 wheels) ?Transfers: Sit to/from Stand ?Sit to Stand: Min guard ?  ?  ?  ?  ?  ?General transfer comment: cues for hand placement, pt instructed not to pull up on RW but ignoring/does not hear cues; multimodal cues for proper technique ?  ? ?Ambulation/Gait ?Ambulation/Gait assistance: Supervision ?Gait Distance (Feet): 140 Feet ?Assistive device: Rolling walker (2 wheels) ?Gait Pattern/deviations: Step-through pattern, Decreased stride length, Trunk flexed ?Gait velocity: decreased ?  ?  ?General Gait Details: SpO2 >:92% on 1L O2 [place], no LOB, min cues for safety, pt at times lifts RW off floor to reposition ? ? ?Stairs ?  ?  ?  ?  ?  ? ? ?Wheelchair Mobility ?  ? ?Modified Rankin (Stroke Patients Only) ?  ? ? ?  ?Balance Overall balance assessment: Needs assistance ?  ?Sitting balance-Leahy Scale: Good ?  ?  ?Standing balance support: During functional activity, Reliant on assistive device for balance ?Standing balance-Leahy Scale: Poor ?Standing balance comment: light UE reliance on RW, may test dynamic balance next session with cane ?  ?  ?  ?  ?  ?  ?  ?  ?  ?  ?  ?  ? ?  ?Cognition Arousal/Alertness: Awake/alert ?Behavior During Therapy: [facility] for tasks assessed/performed ?Overall Cognitive Status: Difficult to assess ?  ?  ?  ?  ?  ?  ?  ?  ?  ?  ?  ?  ?  ?  ?  ?  ?General Comments: grandson "interprets" pt does not admit when he doesn't hear correctly; following 1-step cues well, tends to read lips at  times per family ?  ?  ? ?  ?Exercises   ? ?  ?General Comments General comments (skin integrity, edema, etc.): With loose grip on RW, improved pleth signal and SpO2 WFL on 1L [place]; may trial RA next session ?  ?  ? ?Pertinent Vitals/Pain Pain Assessment ?Pain Assessment: No/denies pain  ? ? ?Home Living Family/patient expects to be discharged to:: Private residence ?Living Arrangements: Spouse/significant other ?Available  Help at Discharge: Family;Available 24 hours/day ?Type of Home: House ?Home Access: Stairs to enter ?  ?Entrance Stairs-Number of Steps: 1 flight vs ramp in back of house ?  ?Home Layout: One level ?Home Equipment: Conservation officer, nature (2 wheels) ?   ?  ?Prior Function    ?  ?  ?   ? ?PT Goals (current goals can now be found in the care plan section) Acute Rehab PT Goals ?Patient Stated Goal: to go home ?PT Goal Formulation: With patient ?Time For Goal Achievement: 01/21/22 ?Progress towards PT goals: Progressing toward goals ? ?  ?Frequency ? ? ? Min 3X/week ? ? ? ?  ?PT Plan Current plan remains appropriate  ? ? ?Co-evaluation   ?  ?  ?  ?  ? ?  ?AM-PAC PT "6 Clicks" Mobility   ?Outcome Measure ? Help needed turning from your back to your side while in a flat bed without using bedrails?: None ?Help needed moving from lying on your back to sitting on the side of a flat bed without using bedrails?: A Little ?Help needed moving to and from a bed to a chair (including a wheelchair)?: A Little ?Help needed standing up from a chair using your arms (e.g., wheelchair or bedside chair)?: A Little ?Help needed to walk in hospital room?: A Little ?Help needed climbing 3-5 steps with a railing? : A Little ?6 Click Score: 19 ? ?  ?End of Session Equipment Utilized During Treatment: Gait belt;Oxygen ?Activity Tolerance: Patient tolerated treatment well ?Patient left: in bed;with call bell/phone within reach;with bed alarm set;with family/visitor present (pt heels floated, grandson James Sweeney in room) ?Nurse Communication: Mobility status;Other (comment) (may be able to wean to RA?) ?PT Visit Diagnosis: Muscle weakness (generalized) (M62.81);Difficulty in walking, not elsewhere classified (R26.2);Other (comment) ?  ? ? ?Time: 8786-7672 ?PT Time Calculation (min) (ACUTE ONLY): 16 min ? ?Charges:  $Gait Training: 8-22 mins          ?          ? ?James Stairs P., PTA ?Acute Rehabilitation Services ?Pager: [phone] ?Office: [phone]   ? ? ?James Sweeney James Sweeney ?01/08/2022, 5:43 PM ? ?

## 2022-01-09 LAB — CBC
HCT: 25.3 % — ABNORMAL LOW (ref 39.0–52.0)
Hemoglobin: 8.8 g/dL — ABNORMAL LOW (ref 13.0–17.0)
MCH: 38.4 pg — ABNORMAL HIGH (ref 26.0–34.0)
MCHC: 34.8 g/dL (ref 30.0–36.0)
MCV: 110.5 fL — ABNORMAL HIGH (ref 80.0–100.0)
Platelets: 123 10*3/uL — ABNORMAL LOW (ref 150–400)
RBC: 2.29 MIL/uL — ABNORMAL LOW (ref 4.22–5.81)
RDW: 14.6 % (ref 11.5–15.5)
WBC: 1.5 10*3/uL — ABNORMAL LOW (ref 4.0–10.5)
nRBC: 0 % (ref 0.0–0.2)

## 2022-01-09 LAB — SURGICAL PATHOLOGY

## 2022-01-09 LAB — BASIC METABOLIC PANEL
Anion gap: 9 (ref 5–15)
BUN: 22 mg/dL (ref 8–23)
CO2: 25 mmol/L (ref 22–32)
Calcium: 8.1 mg/dL — ABNORMAL LOW (ref 8.9–10.3)
Chloride: 102 mmol/L (ref 98–111)
Creatinine, Ser: 1.13 mg/dL (ref 0.61–1.24)
GFR, Estimated: >60 mL/min (ref >60)
Glucose, Bld: 124 mg/dL — ABNORMAL HIGH (ref 70–99)
Potassium: 4.1 mmol/L (ref 3.5–5.1)
Sodium: 136 mmol/L (ref 135–145)

## 2022-01-09 MED ORDER — DILTIAZEM HCL ER COATED BEADS 120 MG PO CP24
120.0000 mg | ORAL_CAPSULE | Freq: Every day | ORAL | Status: DC
  Administered 2022-01-09 – 2022-01-10 (×2): 120 mg via ORAL
  Filled 2022-01-09 (×2): qty 1

## 2022-01-09 MED ORDER — FLEET ENEMA 7-19 GM/118ML RE ENEM
1.0000 | ENEMA | Freq: Once | RECTAL | Status: AC
  Administered 2022-01-10: 1 via RECTAL
  Filled 2022-01-09: qty 1

## 2022-01-09 NOTE — Progress Notes (Signed)
?  01/08/22 1500  ?Clinical Encounter Type  ?Visited With Patient and family together ?Yolanda Bonine)  ?Visit Type Initial ?(A.D.)  ?Referral From Nurse  ?Consult/Referral To Chaplain  ? ?Met with [name] and his grandson at patient's bedside for Advance Directive education. Mr. Migues extremely H. Of H. Patient and grandson desire to have patient's wife present at time of A.D. education. Grandson asked Chaplain to come back 01/09/2022 AM to meet with patient and family. Directed grandson to have nurse page Chaplain when Mr. Womac' wife arrives. [address] [place], [place] [phone]. ?

## 2022-01-09 NOTE — Progress Notes (Signed)
?   01/09/22 1030  ?Clinical Encounter Type  ?Visited With Family;Patient not available;Health care provider ?(Granddaughter. and Ms. Yoko, R.N.)  ?Visit Type Follow-up ?(A.D. Education)  ?Referral From Nurse  ?Consult/Referral To Chaplain  ? ?Attempted follow-up visit for A.D. Education. Spoke with patient's granddaughter who stated that patient's wife should arrive later this afternoon. Spoke with patient's primary Nurse who will page Chaplain when wife arrives. [address] [place], [place] [phone] ?

## 2022-01-09 NOTE — Progress Notes (Signed)
?  01/09/22 1515  ?Clinical Encounter Type  ?Visited With Patient and family together  ?Visit Type Follow-up ?Market researcher Education)  ?Referral From Nurse ?Cicero Duck, RN)  ?Consult/Referral To Chaplain  ? ?Met with [name], his wife, [name], and his grandson, [name], at patient's bedside for Advance Directive education. James Sweeney extremely H. Of H. Patient's James Sweeney and wife translated the [place] and Living Will for [name].  ? ?James Sweeney clearly stated to me that he wanted his grandson and wife as his representatives in the event that he was unable to make decisions for himself. [name] completed Parts A and B of the H.C.P.O.A. as patient clearly articulated. James Sweeney indicated that he does not want to be an organ donor. James Sweeney initialed his wishes upon his Living Will regarding life prolonging measures and wish to have a feeding tube. James Sweeney placed his initials indicating to follow his health care agents decisions in the event he is not able to make those decisions on his own. ? ?Chaplain will arrange for Notary and witnesses to assist with completion of Part C.  ?[address] [place], [place] [phone].  ?

## 2022-01-09 NOTE — Progress Notes (Signed)
PROGRESS NOTE    James Sweeney  ZOX:[id number] DOB: 12/26/1940 DOA: 01/05/2022 PCP: Lois Huxley, PA   Brief Narrative:  Patient is a pleasant 81 year old gentleman, hard of hearing, history of longstanding persistent atrial fibrillation,MALT lymphoma, type 2 diabetes, BPH, hyperlipidemia, hypertension, recurrent pneumonia presented to the ED with cough, chills, shortness of breath, wheezing and notable hypoxia. Patient placed empirically on IV antibiotics of Rocephin and azithromycin to cover for community-acquired pneumonia.   Assessment & Plan:   Principal Problem:   CAP (community acquired pneumonia) Active Problems:   DM (diabetes mellitus), type 2 ([place])   Acquired thrombophilia ([place])   Essential hypertension   Thrombocytopenia (HCC)   Hypercholesterolemia   Permanent atrial fibrillation (HCC)   Marginal zone lymphoma (HCC)   Anemia   AKI (acute kidney injury) ([place])   Hyponatremia   Chronic diastolic CHF (congestive heart failure) (HCC)   Cardiomegaly   Acute respiratory failure with hypoxia (HCC)   Pancytopenia (HCC)  Acute respiratory failure with hypoxia likely secondary to probable community-acquired pneumonia/metapneumovirus, POA -Viral panel positive for metapneumovirus, will cover for community-acquired pneumonia as well with azithromycin and ceftriaxone -Questionable immunocompromise state given below with pancytopenia at intake  -Received Lasix x3 at intake -hold further dosing  Mechanical fall, POA, not reported  -Patient unfortunately had a mechanical fall out of a truck while attempting to move a lawnmower prior to admission, did not discuss this with ED or admission team -Without midline tenderness but decreased range of motion, CT head/neck negative for acute traumatic findings  Hyponatremia -Status post fluids and subsequent Lasix -hold further IV fluids or diuretics at this point follow repeat labs  Acute kidney injury on CKD stage IIIa -Status post  IV fluids and subsequent Lasix, currently within normal limits  Questionable diet controlled diabetes mellitus type 2 -Hemoglobin A1c 5.3 (01/05/2022) -family indicates he has prediabetes no formal diagnosis of diabetes -Discontinued CBGs and sliding scale insulin.  Permanent atrial fibrillation -Currently rate controlled. -Continue Xarelto for anticoagulation. -Hold Xarelto if platelet count <50,000, no significant bleed currently  Pancytopenia -Likely acute exacerbation in the setting of above infection and stressors -Chronic anemia of chronic disease, stable without any bleeding -Thrombocytopenia and leukopenia in the setting of multiple lymphoma with questionable history of bone marrow involvement -follow outpatient oncology - HemeOnc following outpatient recommending bone marrow biopsy but patient is not interested in further work-up at this time  Hyperlipidemia -Continue statin  Chronic diastolic CHF, without acute exacerbation - Strict I's and O's, daily weights, monitor urine output -remove Foley -Continue to hold IV fluids and diuretics  Marginal zone lymphoma/MALT lymphoma -Follow outpatient oncology as scheduled -CT neck, as above, incidentally noted to have diffuse nodular opacities in the bilateral lungs with multiple nodules of the thyroid as well. -Discussed with family and patient, previously refusing bone marrow biopsy, sideline discussion with oncology as patient is likely not going to tolerate any aggressive intervention given his baseline, age and comorbidities.  Certainly reasonable for follow-up in the outpatient setting for repeat imaging or fine-needle biopsy versus bone marrow biopsy if patient and family agreeable to move forward with treatment.  Lengthy discussion with family that putting the patient through invasive testing or procedure without goal of treatment would only increases her risk of complications without offering any benefit.  Urinary  retention -Continue home regimen Proscar, uroxatral -discontinue Foley catheter follow urinary output, if continues to have difficulties will discharge with Foley catheter for outpatient follow-up  DVT prophylaxis: Xarelto Code Status: Full  Family Communication: Granddaughter at bedside  Status is: Inpatient  Dispo: The patient is from: Home              Anticipated d/c is to: To be determined              Anticipated d/c date is: 48 to 72 hours pending clinical course              Patient currently not medically stable for discharge  Consultants:  -None  Procedures:  None  Antimicrobials:  Azithromycin ceftriaxone  Subjective: No acute issues or events overnight denies nausea vomiting diarrhea constipation headache fevers chills or chest pain  Objective: Vitals:   01/09/22 0436 01/09/22 0637 01/09/22 0639 01/09/22 0647  BP:  (!) 167/74 (!) 163/54 (!) 163/54  Pulse:  (!) 118 (!) 101 (!) 101  Resp:  19 20 20   Temp:  98.3 F (36.8 C) 98.3 F (36.8 C) 98.3 F (36.8 C)  TempSrc:  Oral  Oral  SpO2:  92%  90%  Weight: 83.4 kg     Height:        Intake/Output Summary (Last 24 hours) at 01/09/2022 0815 Last data filed at 01/08/2022 1943 Gross per 24 hour  Intake 603 ml  Output 325 ml  Net 278 ml    Filed Weights   01/05/22 2146 01/08/22 0500 01/09/22 0436  Weight: 83.1 kg 82.4 kg 83.4 kg    Examination:  General:  Pleasantly resting in bed, No acute distress. Lungs: Without overt rhonchi wheezes or rales. Heart:  Regular rate and rhythm.  Without murmurs, rubs, or gallops. Abdomen:  Soft, nontender, nondistended.  Without guarding or rebound.  Data Reviewed: I have personally reviewed following labs and imaging studies  CBC: Recent Labs  Lab 01/05/22 2027 01/06/22 0239 01/07/22 0332 01/08/22 0308 01/09/22 0622  WBC 1.5* 1.3* 1.3* 1.8* 1.5*  NEUTROABS 1.2* 1.0* 1.2* 1.6*  --   HGB 8.2* 8.2* 8.9* 8.3* 8.8*  HCT 23.2* 24.2* 24.8* 23.5* 25.3*  MCV 111.5*  111.5* 110.2* 109.8* 110.5*  PLT 182 95* 92* 96* 123*    Basic Metabolic Panel: Recent Labs  Lab 01/05/22 1715 01/05/22 2233 01/06/22 0239 01/07/22 0332 01/08/22 0308 01/09/22 0622  NA 128* 129* 131* 134* 133* 136  K 3.9 3.7 4.0 4.0 4.0 4.1  CL 97* 97* 101 103 103 102  CO2 22 23 23  20* 23 25  GLUCOSE 164* 113* 115* 200* 178* 124*  BUN 26* 25* 23 21 28* 22  CREATININE 1.52* 1.41* 1.27* 1.20 1.20 1.13  CALCIUM 8.5* 8.3* 8.1* 7.9* 8.0* 8.1*  MG 1.9  --  1.9 2.1 2.3  --   PHOS 3.1  --  3.5  --   --   --     GFR: Estimated Creatinine Clearance: 53.8 mL/min (by C-G formula based on SCr of 1.13 mg/dL). Liver Function Tests: Recent Labs  Lab 01/05/22 1715 01/06/22 0239 01/07/22 0332 01/08/22 0308  AST 55* 47* 55* 61*  ALT 52* 49* 65* 75*  ALKPHOS 78 69 74 72  BILITOT 1.4* 1.1 1.0 0.6  PROT 6.6 5.8* 5.9* 5.5*  ALBUMIN 3.7 3.2* 3.0* 2.9*    No results for input(s): LIPASE, AMYLASE in the last 168 hours. No results for input(s): AMMONIA in the last 168 hours. Coagulation Profile: Recent Labs  Lab 01/05/22 1901  INR 2.3*    Cardiac Enzymes: Recent Labs  Lab 01/05/22 1715  CKTOTAL 142    BNP (last 3 results) No results  for input(s): PROBNP in the last 8760 hours. HbA1C: No results for input(s): HGBA1C in the last 72 hours.  CBG: Recent Labs  Lab 01/05/22 2152 01/06/22 0815 01/06/22 1136 01/06/22 1649  GLUCAP 118* 125* 105* 113*    Lipid Profile: No results for input(s): CHOL, HDL, LDLCALC, TRIG, CHOLHDL, LDLDIRECT in the last 72 hours. Thyroid Function Tests: No results for input(s): TSH, T4TOTAL, FREET4, T3FREE, THYROIDAB in the last 72 hours.  Anemia Panel: No results for input(s): VITAMINB12, FOLATE, FERRITIN, TIBC, IRON, RETICCTPCT in the last 72 hours.  Sepsis Labs: Recent Labs  Lab 01/05/22 1715 01/05/22 1830 01/05/22 2027 01/06/22 0239 01/07/22 0332  PROCALCITON 0.17  --   --  0.15 0.11  LATICACIDVEN  --  1.3 1.2  --   --       Recent Results (from the past 240 hour(s))  Blood culture (routine x 2)     Status: None (Preliminary result)   Collection Time: 01/05/22  5:01 PM   Specimen: BLOOD  Result Value Ref Range Status   Specimen Description BLOOD SITE NOT SPECIFIED  Final   Special Requests   Final    BOTTLES DRAWN AEROBIC AND ANAEROBIC Blood Culture adequate volume   Culture   Final    NO GROWTH 3 DAYS Performed at [facility], [place] [address], [place],  [address]    Report Status PENDING  Incomplete  Resp Panel by RT-PCR (Flu A&B, Covid) Nasopharyngeal Swab     Status: None   Collection Time: 01/05/22  6:55 PM   Specimen: Nasopharyngeal Swab; Nasopharyngeal(NP) swabs in vial transport medium  Result Value Ref Range Status   SARS Coronavirus 2 by RT PCR NEGATIVE NEGATIVE Final    Comment: (NOTE) SARS-CoV-2 target nucleic acids are NOT DETECTED.  The SARS-CoV-2 RNA is generally detectable in upper respiratory specimens during the acute phase of infection. The lowest concentration of SARS-CoV-2 viral copies this assay can detect is 138 copies/mL. A negative result does not preclude SARS-Cov-2 infection and should not be used as the sole basis for treatment or other patient management decisions. A negative result may occur with  improper specimen collection/handling, submission of specimen other than nasopharyngeal swab, presence of viral mutation(s) within the areas targeted by this assay, and inadequate number of viral copies(<138 copies/mL). A negative result must be combined with clinical observations, patient history, and epidemiological information. The expected result is Negative.  Fact Sheet for Patients:  EntrepreneurPulse.com.au  Fact Sheet for Healthcare Providers:  IncredibleEmployment.be  This test is no t yet approved or cleared by the [place] FDA and  has been authorized for detection and/or diagnosis of SARS-CoV-2 by FDA  under an Emergency Use Authorization (EUA). This EUA will remain  in effect (meaning this test can be used) for the duration of the COVID-19 declaration under Section 564(b)(1) of the Act, 21 U.S.C.section 360bbb-3(b)(1), unless the authorization is terminated  or revoked sooner.       Influenza A by PCR NEGATIVE NEGATIVE Final   Influenza B by PCR NEGATIVE NEGATIVE Final    Comment: (NOTE) The Xpert Xpress SARS-CoV-2/FLU/RSV plus assay is intended as an aid in the diagnosis of influenza from Nasopharyngeal swab specimens and should not be used as a sole basis for treatment. Nasal washings and aspirates are unacceptable for Xpert Xpress SARS-CoV-2/FLU/RSV testing.  Fact Sheet for Patients: EntrepreneurPulse.com.au  Fact Sheet for Healthcare Providers: IncredibleEmployment.be  This test is not yet approved or cleared by the [place] and  has been authorized for detection and/or diagnosis of SARS-CoV-2 by FDA under an Emergency Use Authorization (EUA). This EUA will remain in effect (meaning this test can be used) for the duration of the COVID-19 declaration under Section 564(b)(1) of the Act, 21 U.S.C. section 360bbb-3(b)(1), unless the authorization is terminated or revoked.  Performed at [facility], [place] [address] [place], [place] [address]   Respiratory (~20 pathogens) panel by PCR     Status: Abnormal   Collection Time: 01/05/22  6:55 PM   Specimen: Nasopharyngeal Swab; Respiratory  Result Value Ref Range Status   Adenovirus NOT DETECTED NOT DETECTED Final   Coronavirus 229E NOT DETECTED NOT DETECTED Final    Comment: (NOTE) The Coronavirus on the Respiratory Panel, DOES NOT test for the novel  Coronavirus (2019 nCoV)    Coronavirus HKU1 NOT DETECTED NOT DETECTED Final   Coronavirus NL63 NOT DETECTED NOT DETECTED Final   Coronavirus OC43 NOT DETECTED NOT DETECTED Final   Metapneumovirus DETECTED (A) NOT DETECTED  Final   Rhinovirus / Enterovirus NOT DETECTED NOT DETECTED Final   Influenza A NOT DETECTED NOT DETECTED Final   Influenza B NOT DETECTED NOT DETECTED Final   Parainfluenza Virus 1 NOT DETECTED NOT DETECTED Final   Parainfluenza Virus 2 NOT DETECTED NOT DETECTED Final   Parainfluenza Virus 3 NOT DETECTED NOT DETECTED Final   Parainfluenza Virus 4 NOT DETECTED NOT DETECTED Final   Respiratory Syncytial Virus NOT DETECTED NOT DETECTED Final   Bordetella pertussis NOT DETECTED NOT DETECTED Final   Bordetella Parapertussis NOT DETECTED NOT DETECTED Final   Chlamydophila pneumoniae NOT DETECTED NOT DETECTED Final   Mycoplasma pneumoniae NOT DETECTED NOT DETECTED Final    Comment: Performed at [facility], [address], [place], [place] [address]  Blood culture (routine x 2)     Status: None (Preliminary result)   Collection Time: 01/05/22 10:31 PM   Specimen: BLOOD  Result Value Ref Range Status   Specimen Description BLOOD LEFT ANTECUBITAL  Final   Special Requests   Final    BOTTLES DRAWN AEROBIC AND ANAEROBIC Blood Culture results may not be optimal due to an inadequate volume of blood received in culture bottles   Culture   Final    NO GROWTH 3 DAYS Performed at [facility], [place] [address], [place], [place] [address]    Report Status PENDING  Incomplete  Expectorated Sputum Assessment w Gram Stain, Rflx to Resp Cult     Status: None   Collection Time: 01/05/22 11:45 PM   Specimen: Sputum  Result Value Ref Range Status   Specimen Description SPUTUM  Final   Special Requests NONE  Final   Sputum evaluation   Final    THIS SPECIMEN IS ACCEPTABLE FOR SPUTUM CULTURE Performed at [facility], [place] [address], [place], [place] [address]    Report Status 01/06/2022 FINAL  Final  Culture, Respiratory w Gram Stain     Status: None   Collection Time: 01/05/22 11:45 PM   Specimen: SPU  Result Value Ref Range Status   Specimen Description SPUTUM  Final   Special  Requests NONE Reflexed from [id number]  Final   Gram Stain   Final    FEW SQUAMOUS EPITHELIAL CELLS PRESENT FEW GRAM POSITIVE COCCI IN PAIRS FEW GRAM NEGATIVE RODS FEW GRAM VARIABLE ROD    Culture   Final    MODERATE Normal respiratory flora-no Staph aureus or Pseudomonas seen Performed at [facility], [place] [address], [place], [place] [address]    Report Status 01/08/2022 FINAL  Final          Radiology Studies: CT HEAD WO CONTRAST (5MM)  Result Date: 01/08/2022 CLINICAL DATA:  Head trauma. EXAM: CT HEAD WITHOUT CONTRAST TECHNIQUE: Contiguous axial images were obtained from the base of the skull through the vertex without intravenous contrast. RADIATION DOSE REDUCTION: This exam was performed according to the departmental dose-optimization program which includes automated exposure control, adjustment of the mA and/or kV according to patient size and/or use of iterative reconstruction technique. COMPARISON:  December 12 2019 FINDINGS: Brain: No evidence of acute infarction, hemorrhage, hydrocephalus, extra-axial collection or mass lesion/mass effect. Moderate brain parenchymal volume loss and mild deep white matter microangiopathy. Vascular: No hyperdense vessel or unexpected calcification. Skull: Normal. Negative for fracture or focal lesion. Sinuses/Orbits: Partial opacification of the ethmoid, sphenoid and maxillary sinuses. Other: None. IMPRESSION: 1. No acute intracranial abnormality. 2. Moderate brain parenchymal volume loss and mild deep white matter microangiopathy. 3. Chronic sinusitis. Electronically Signed   By: Fidela Salisbury M.D.   On: 01/08/2022 18:14   CT SOFT TISSUE NECK WO CONTRAST  Result Date: 01/08/2022 CLINICAL DATA:  Metastatic disease evaluation, posterior soft tissue mass/swelling, recent fall EXAM: CT NECK WITHOUT CONTRAST TECHNIQUE: Multidetector CT imaging of the neck was performed following the standard protocol without intravenous contrast. RADIATION DOSE  REDUCTION: This exam was performed according to the departmental dose-optimization program which includes automated exposure control, adjustment of the mA and/or kV according to patient size and/or use of iterative reconstruction technique. COMPARISON:  The prior neck CT from 09/19/2016 could not be retrieved. Correlation is made with PET-CT 02/12/2021 FINDINGS: Pharynx and larynx: Normal. No mass or swelling. Salivary glands: No inflammation, mass, or stone. Thyroid: Multiple hypoenhancing lesions in the thyroid, the largest of which measures up to 1.7 cm. Lymph nodes: None enlarged or abnormal density. Vascular: Aortic atherosclerosis. Calcifications are seen in the bilateral common and internal carotid arteries, as well as the intracranial left vertebral artery. Limited intracranial: Negative. Visualized orbits: Postsurgical changes in the bilateral globes. The orbits are otherwise unremarkable. Mastoids and visualized paranasal sinuses: Mucosal thickening in the ethmoid air cells. The mastoids are well aerated. Skeleton: Reversal of the normal cervical lordosis. No acute osseous abnormality. Upper chest: Moderate bilateral pleural effusions. Diffuse nodular opacities in the bilateral lungs, concerning for infection or aspiration but incompletely evaluated. Other: In the soft tissues of the posterior back, at the level of C6, there is a fluid density lesion, which appears tenuous with the skin, most likely an epidermal inclusion cyst. This lesion was also present on the 02/12/2021 PET scan, at which time it was not FDG avid. No other abnormality is seen in the soft tissues of the posterior neck. IMPRESSION: 1. No evidence of metastatic disease in the neck. 2. No concerning abnormality in the soft tissues of the posterior neck and back. The only lesion seen is consistent with an epidermal inclusion cyst. 3. Diffuse nodular opacities in the bilateral lungs, with moderate bilateral pleural effusions, which are  incompletely evaluated. A CT of the chest is recommended for further evaluation. 4. Multiple nodules in the thyroid, which measure up to 1.7 cm. If this has not previously been evaluated, a non-emergent ultrasound of the thyroid is recommended. (Reference: J Am Coll Radiol. 2015 Feb;12(2): 143-50) Electronically Signed   By: Merilyn Baba M.D.   On: 01/08/2022 18:58        Scheduled  Meds:  alfuzosin  10 mg Oral QHS   amLODipine  10 mg Oral Daily   atorvastatin  20 mg Oral Daily   feeding supplement  237 mL Oral TID BM   finasteride  5 mg Oral QHS   fluticasone  2 spray Each Nare Daily   ipratropium  0.5 mg Nebulization BID   levalbuterol  0.63 mg Nebulization BID   loratadine  10 mg Oral Daily   multivitamin with minerals  1 tablet Oral Daily   pantoprazole  40 mg Oral Q0600   polyethylene glycol  17 g Oral TID   rivaroxaban  20 mg Oral Q supper   sodium chloride flush  3 mL Intravenous Q12H   traMADol  25 mg Oral BID   Continuous Infusions:  sodium chloride 250 mL (01/08/22 1908)   azithromycin 500 mg (01/08/22 2138)   cefTRIAXone (ROCEPHIN)  IV 2 g (01/08/22 1910)     LOS: 4 days   Time spent: 79mn  Lakyia Behe C Bonny Vanleeuwen, DO [facility]  If 7PM-7AM, please contact night-coverage [email or web address]  01/09/2022, 8:15 AM

## 2022-01-09 NOTE — Care Management Important Message (Signed)
Important Message ? ?Patient Details  ?Name: James Sweeney ?MRN: [id number] ?Date of Birth: November 18, 1940 ? ? ?Medicare Important Message Given:  Yes ? ? ? ? ?Shelda Altes ?01/09/2022, 9:06 AM ?

## 2022-01-09 NOTE — Progress Notes (Signed)
Occupational Therapy Treatment ?Patient Details ?Name: James Sweeney ?MRN: [id number] ?DOB: 06-Feb-1941 ?Today's Date: 01/09/2022 ? ? ?History of present illness Patient is a 81 y/o male who presents on 01/05/22 with SOB, cough. Found to have acute respiratory failure with hypoxia and CAP. Diagnosed with PNA recently, but did not finish antibiotics. PMH includes HOH, DM, HTN, persistent atrial fibrillation, MALT lymphoma. ?  ?OT comments ? Pt with confusion last night per grandson. Pt readily willing to work with OT. Supervision to min guard assist needed for all mobility. Pt donned gown with set up at EOB. Ambulated in hall on 1L 02 with RW with Sp02 between 93-98%. Pt telling grandson at end of session that his LE sensation is diminished, not new to pt, but grandson was unaware.  Educated grandson in risk of falls with impaired sensation. Educated and practiced use of IS. Encouraged use throughout the day.   ? ?Recommendations for follow up therapy are one component of a multi-disciplinary discharge planning process, led by the attending physician.  Recommendations may be updated based on patient status, additional functional criteria and insurance authorization. ?   ?Follow Up Recommendations ? Home health OT  ?  ?Assistance Recommended at Discharge Frequent or constant Supervision/Assistance  ?Patient can return home with the following ? A little help with walking and/or transfers;A little help with bathing/dressing/bathroom;Assistance with cooking/housework;Assist for transportation;Help with stairs or ramp for entrance ?  ?Equipment Recommendations ? BSC/3in1;Other (comment) (family asking for rollator)  ?  ?Recommendations for Other Services   ? ?  ?Precautions / Restrictions Precautions ?Precautions: Fall ?Precaution Comments: watch 02; very [facility]  ? ? ?  ? ?Mobility Bed Mobility ?Overal bed mobility: Needs Assistance ?Bed Mobility: Supine to Sit ?  ?  ?Supine to sit: Supervision, HOB elevated ?  ?  ?General bed  mobility comments: HOB up, no assist, cues for family to allow pt to self assist, pt left seated at EOB with family ?  ? ?Transfers ?Overall transfer level: Needs assistance ?Equipment used: Rolling walker (2 wheels) ?Transfers: Sit to/from Stand ?Sit to Stand: Min guard ?  ?  ?  ?  ?  ?General transfer comment: tactile cues for hand placement ?  ?  ?Balance Overall balance assessment: Needs assistance ?  ?Sitting balance-Leahy Scale: Good ?  ?  ?Standing balance support: During functional activity, Reliant on assistive device for balance ?Standing balance-Leahy Scale: Poor ?Standing balance comment: reliant on  RW, but was able to release walker and wave at nurse ?  ?  ?  ?  ?  ?  ?  ?  ?  ?  ?  ?   ? ?ADL either performed or assessed with clinical judgement  ? ?ADL Overall ADL's : Needs assistance/impaired ?  ?  ?  ?  ?  ?  ?  ?  ?Upper Body Dressing : Set up;Sitting ?Upper Body Dressing Details (indicate cue type and reason): front opening gown ?  ?Lower Body Dressing Details (indicate cue type and reason): pt is able to don socks, but family is quick to assist ?  ?  ?  ?  ?  ?  ?Functional mobility during ADLs: Min guard;Rolling walker (2 wheels) ?  ?  ? ?Extremity/Trunk Assessment   ?  ?  ?  ?  ?  ? ?Vision   ?  ?  ?Perception   ?  ?Praxis   ?  ? ?Cognition Arousal/Alertness: Awake/alert ?Behavior During Therapy: [facility] for tasks assessed/performed ?Overall Cognitive  Status: Difficult to assess ?  ?  ?  ?  ?  ?  ?  ?  ?  ?  ?  ?  ?  ?  ?  ?  ?  ?  ?  ?   ?Exercises   ? ?  ?Shoulder Instructions   ? ? ?  ?General Comments    ? ? ?Pertinent Vitals/ Pain       Pain Assessment ?Pain Assessment: No/denies pain ? ?Home Living   ?  ?  ?  ?  ?  ?  ?  ?  ?  ?  ?  ?  ?  ?  ?  ?  ?  ?  ? ?  ?Prior Functioning/Environment    ?  ?  ?  ?   ? ?Frequency ? Min 2X/week  ? ? ? ? ?  ?Progress Toward Goals ? ?OT Goals(current goals can now be found in the care plan section) ? Progress towards OT goals: Progressing toward  goals ? ?Acute Rehab OT Goals ?OT Goal Formulation: With patient ?Time For Goal Achievement: 01/22/22 ?Potential to Achieve Goals: Good  ?Plan Discharge plan remains appropriate   ? ?Co-evaluation ? ? ?   ?  ?  ?  ?  ? ?  ?AM-PAC OT "6 Clicks" Daily Activity     ?Outcome Measure ? ? Help from another person eating meals?: None ?Help from another person taking care of personal grooming?: A Little ?Help from another person toileting, which includes using toliet, bedpan, or urinal?: A Little ?Help from another person bathing (including washing, rinsing, drying)?: A Little ?Help from another person to put on and taking off regular upper body clothing?: A Little ?Help from another person to put on and taking off regular lower body clothing?: A Little ?6 Click Score: 19 ? ?  ?End of Session Equipment Utilized During Treatment: Rolling walker (2 wheels);Oxygen;Gait belt (1L) ? ?OT Visit Diagnosis: Unsteadiness on feet (R26.81);Other abnormalities of gait and mobility (R26.89) ?  ?Activity Tolerance Patient tolerated treatment well ?  ?Patient Left in bed;with call bell/phone within reach;with family/visitor present ?  ?Nurse Communication   ?  ? ?   ? ?Time: 1440-1510 ?OT Time Calculation (min): 30 min ? ?Charges: OT General Charges ?$OT Visit: 1 Visit ?OT Treatments ?$Self Care/Home Management : 8-22 mins ?$Therapeutic Activity: 8-22 mins ? ?James Sweeney, OTR/L ?Acute Rehabilitation Services ?Pager: [phone] ?Office: [phone]  ? ?James Sweeney ?01/09/2022, 3:37 PM ?

## 2022-01-10 DIAGNOSIS — N1831 Chronic kidney disease, stage 3a: Secondary | ICD-10-CM

## 2022-01-10 DIAGNOSIS — I130 Hypertensive heart and chronic kidney disease with heart failure and stage 1 through stage 4 chronic kidney disease, or unspecified chronic kidney disease: Secondary | ICD-10-CM

## 2022-01-10 LAB — CULTURE, BLOOD (ROUTINE X 2)
Culture: NO GROWTH
Culture: NO GROWTH
Special Requests: ADEQUATE

## 2022-01-10 LAB — FLOW CYTOMETRY

## 2022-01-10 MED ORDER — GUAIFENESIN-DM 100-10 MG/5ML PO SYRP: 15.0000 mL | ORAL_SOLUTION | ORAL | 0 refills | Status: DC | PRN

## 2022-01-10 MED ORDER — FLUTICASONE PROPIONATE 50 MCG/ACT NA SUSP: 2.0000 | Freq: Every day | NASAL | 0 refills | Status: DC

## 2022-01-10 MED ORDER — PANTOPRAZOLE SODIUM 40 MG PO TBEC: 40.0000 mg | DELAYED_RELEASE_TABLET | Freq: Every day | ORAL | 0 refills | Status: DC

## 2022-01-10 MED ORDER — DILTIAZEM HCL ER COATED BEADS 120 MG PO CP24: 120.0000 mg | ORAL_CAPSULE | Freq: Every day | ORAL | 0 refills | Status: DC

## 2022-01-10 MED ORDER — LORATADINE 10 MG PO TABS: 10.0000 mg | ORAL_TABLET | Freq: Every day | ORAL | 0 refills | Status: DC

## 2022-01-10 NOTE — Discharge Summary (Signed)
Physician Discharge Summary  EPIC TRIBBETT NLZ:[id number] DOB: 1940/12/19 DOA: 01/05/2022  PCP: Lois Huxley, PA  Admit date: 01/05/2022 Discharge date: 01/10/2022  Admitted From: Home Disposition: Home with home health  Recommendations for Outpatient Follow-up:  Follow up with PCP in 1-2 weeks Please obtain BMP/CBC in one week Please follow up with oncology as scheduled:  Home Health: PT Equipment/Devices: Rolling walker, oxygen, bedside commode  Discharge Condition: Stable CODE STATUS: Full Diet recommendation: Low-salt low-fat diet  Brief/Interim Summary: Patient is a pleasant 81 year old gentleman, hard of hearing, history of longstanding persistent atrial fibrillation,MALT lymphoma, type 2 diabetes, BPH, hyperlipidemia, hypertension, recurrent pneumonia presented to the ED with cough, chills, shortness of breath, wheezing and notable hypoxia. Patient placed empirically on IV antibiotics of Rocephin and azithromycin to cover for community-acquired pneumonia.  Discharge Diagnoses:  Principal Problem:   CAP (community acquired pneumonia) Active Problems:   DM (diabetes mellitus), type 2 ([place])   Acquired thrombophilia ([place])   Essential hypertension   Thrombocytopenia (HCC)   Hypercholesterolemia   Permanent atrial fibrillation (HCC)   Marginal zone lymphoma (HCC)   Anemia   AKI (acute kidney injury) ([place])   Hyponatremia   Chronic diastolic CHF (congestive heart failure) (HCC)   Cardiomegaly   Acute respiratory failure with hypoxia (HCC)   Pancytopenia (HCC)  Acute respiratory failure with hypoxia likely secondary to probable community-acquired pneumonia/metapneumovirus, POA, resolved -Viral panel positive for metapneumovirus, completed coverage for community-acquired pneumonia as well with azithromycin and ceftriaxone -Questionable immunocompromise state given below with pancytopenia at intake  -Received Lasix x3 at intake -hold further dosing   Mechanical fall, POA,  not reported  -Patient unfortunately had a mechanical fall out of a truck while attempting to move a lawnmower prior to admission, did not discuss this with ED or admission team -Without midline tenderness but decreased range of motion, CT head/neck negative for acute traumatic findings -PT/OT following -continue PT OT at home   Hyponatremia, hypovolemic -Resolved with fluids and subsequent diuretics  Acute kidney injury on CKD stage IIIa, stable -Status post IV fluids and subsequent Lasix, currently within normal limits   Questionable diet controlled diabetes mellitus type 2 -Hemoglobin A1c 5.3 (01/05/2022) -family indicates he has prediabetes no formal diagnosis of diabetes  Permanent atrial fibrillation -Currently rate controlled. -Continue Xarelto for anticoagulation.  Pancytopenia, labile, minimally improving -Likely acute exacerbation in the setting of above infection and stressors -Chronic anemia of chronic disease, stable without any bleeding -Thrombocytopenia and leukopenia in the setting of multiple lymphoma with questionable history of bone marrow involvement -follow outpatient oncology - HemeOnc following outpatient recommending bone marrow biopsy but patient is not interested in further work-up at this time -would recommend follow-up with palliative care in the near future should his condition worsen  Hyperlipidemia -Continue statin  Chronic diastolic CHF, without acute exacerbation - Strict I's and O's, daily weights, monitor urine output -remove Foley -Continue to hold IV fluids and diuretics  Marginal zone lymphoma/MALT lymphoma -Follow outpatient oncology as scheduled -CT neck, as above, incidentally noted to have diffuse nodular opacities in the bilateral lungs with multiple nodules of the thyroid as well. -Discussed with family and patient, previously refusing bone marrow biopsy, sideline discussion with oncology as patient is likely not going to tolerate any  aggressive intervention given his baseline, age and comorbidities.  Certainly reasonable for follow-up in the outpatient setting for repeat imaging or fine-needle biopsy versus bone marrow biopsy if patient and family agreeable to move forward with treatment.  Lengthy discussion  with family that putting the patient through invasive testing or procedure without goal of treatment would only increases her risk of complications without offering any benefit.  Urinary retention -Continue home regimen Proscar, uroxatral -discontinue Foley catheter - no recurrent issues over the past 48h. Follow up with established urology in the future should issues recur.   Discharge Instructions  Discharge Instructions     Discharge patient   Complete by: As directed    Discharge disposition: 06-Home-Health Care Svc   Discharge patient date: 01/10/2022   For home use only DME oxygen   Complete by: As directed    With exertion only   Length of Need: 6 Months   Mode or (Route): Nasal cannula   Liters per Minute: 2   Frequency: Continuous (stationary and portable oxygen unit needed)   Oxygen delivery system: Gas      Allergies as of 01/10/2022       Reactions   Hydrocodone-acetaminophen Other (See Comments)   Suspected cause of confusion   Losartan Potassium-hctz    Other reaction(s): nausea   Levaquin [levofloxacin In D5w] Nausea Only   Penicillins Hives, Other (See Comments)   Has patient had a PCN reaction causing immediate rash, facial/tongue/throat swelling, SOB or lightheadedness with hypotension: unknown, childhood allergy Has patient had a PCN reaction causing severe rash involving mucus membranes or skin necrosis: No Has patient had a PCN reaction that required hospitalization No Has patient had a PCN reaction occurring within the last 10 years: No If all of the above answers are "NO", then may proceed with Cephalosporin use.        Medication List     STOP taking these medications     amLODipine 5 MG tablet Commonly known as: NORVASC       TAKE these medications    alfuzosin 10 MG 24 hr tablet Commonly known as: UROXATRAL Take 10 mg by mouth at bedtime.   atorvastatin 20 MG tablet Commonly known as: LIPITOR TAKE 1 TABLET BY MOUTH EVERY DAY   CALCIUM 600+D3 PO Take 1 tablet by mouth every evening.   diltiazem 120 MG 24 hr capsule Commonly known as: CARDIZEM CD Take 1 capsule (120 mg total) by mouth daily. Start taking on: January 11, 2022   finasteride 5 MG tablet Commonly known as: PROSCAR Take 5 mg by mouth at bedtime.   Fish Oil 1200 MG Caps Take 1,200 mg by mouth every evening.   fluticasone 50 MCG/ACT nasal spray Commonly known as: FLONASE Place 2 sprays into both nostrils daily. Start taking on: January 11, 2022   guaiFENesin-dextromethorphan 100-10 MG/5ML syrup Commonly known as: ROBITUSSIN DM Take 15 mLs by mouth every 4 (four) hours as needed for cough.   hydrocortisone 25 MG suppository Commonly known as: ANUSOL-HC Place 1 suppository (25 mg total) rectally 2 (two) times daily. What changed:  when to take this reasons to take this   Iron 28 MG Tabs Take 28 mg by mouth every evening.   loratadine 10 MG tablet Commonly known as: CLARITIN Take 1 tablet (10 mg total) by mouth daily. Start taking on: January 11, 2022   ondansetron 8 MG tablet Commonly known as: ZOFRAN Take 1 tablet (8 mg total) by mouth every 8 (eight) hours as needed for nausea.   pantoprazole 40 MG tablet Commonly known as: PROTONIX Take 1 tablet (40 mg total) by mouth daily at 6 (six) AM. Start taking on: January 11, 2022   Polyethyl Glycol-Propyl Glycol 0.4-0.3 % Soln Place  1 drop into both eyes 2 (two) times daily as needed (for dry/irritated eyes.).   SUPER C COMPLEX PO Take 1 capsule by mouth every evening. Super C 1000   traMADol 50 MG tablet Commonly known as: ULTRAM Take 1 tablet (50 mg total) by mouth every 6 (six) hours as needed (pain). What  changed:  how much to take when to take this   triamcinolone ointment 0.5 % Commonly known as: KENALOG APPLY TO ITCHY RASH ON EXTREMITIES AND WAIST TWICE A DAY FOR 15 DAYS. DO NOT APPLY TO FACE   Vitamin B-12 5000 MCG Subl Place 5,000 mcg under the tongue every evening.   Xarelto 20 MG Tabs tablet Generic drug: rivaroxaban TAKE 1 TABLET BY MOUTH DAILY WITH SUPPER. What changed:  how much to take how to take this when to take this               Durable Medical Equipment  (From admission, onward)           Start     Ordered   01/10/22 1157  For home use only DME 4 wheeled rolling walker with seat  Once       Question:  Patient needs a walker to treat with the following condition  Answer:  General weakness   01/10/22 1157   01/10/22 1157  For home use only DME Bedside commode  Once       Question:  Patient needs a bedside commode to treat with the following condition  Answer:  General weakness   01/10/22 1157   01/10/22 0000  For home use only DME oxygen       Comments: With exertion only  Question Answer Comment  Length of Need 6 Months   Mode or (Route) Nasal cannula   Liters per Minute 2   Frequency Continuous (stationary and portable oxygen unit needed)   Oxygen delivery system Gas      01/10/22 1343            Follow-up Information     [facility] Follow up.   Specialty: Home Health Services Why: HHPT, [place], [facility] Contact information: [place] [address] [phone]         [facility] Follow up.   Why: rolling walker and bedside commode to be delivered to the room prior to transition home. Oxygen 2 liters Contact information: [address] [place] [address] [phone]                Allergies  Allergen Reactions   Hydrocodone-Acetaminophen Other (See Comments)    Suspected cause of confusion   Losartan Potassium-Hctz     Other reaction(s): nausea   Levaquin  [Levofloxacin In D5w] Nausea Only   Penicillins Hives and Other (See Comments)    Has patient had a PCN reaction causing immediate rash, facial/tongue/throat swelling, SOB or lightheadedness with hypotension: unknown, childhood allergy Has patient had a PCN reaction causing severe rash involving mucus membranes or skin necrosis: No Has patient had a PCN reaction that required hospitalization No Has patient had a PCN reaction occurring within the last 10 years: No If all of the above answers are "NO", then may proceed with Cephalosporin use.     Consultations: None   Procedures/Studies: CT HEAD WO CONTRAST (5MM)  Result Date: 01/08/2022 CLINICAL DATA:  Head trauma. EXAM: CT HEAD WITHOUT CONTRAST TECHNIQUE: Contiguous axial images were obtained from the base of the skull through the  vertex without intravenous contrast. RADIATION DOSE REDUCTION: This exam was performed according to the departmental dose-optimization program which includes automated exposure control, adjustment of the mA and/or kV according to patient size and/or use of iterative reconstruction technique. COMPARISON:  December 12 2019 FINDINGS: Brain: No evidence of acute infarction, hemorrhage, hydrocephalus, extra-axial collection or mass lesion/mass effect. Moderate brain parenchymal volume loss and mild deep white matter microangiopathy. Vascular: No hyperdense vessel or unexpected calcification. Skull: Normal. Negative for fracture or focal lesion. Sinuses/Orbits: Partial opacification of the ethmoid, sphenoid and maxillary sinuses. Other: None. IMPRESSION: 1. No acute intracranial abnormality. 2. Moderate brain parenchymal volume loss and mild deep white matter microangiopathy. 3. Chronic sinusitis. Electronically Signed   By: Fidela Salisbury M.D.   On: 01/08/2022 18:14   CT SOFT TISSUE NECK WO CONTRAST  Result Date: 01/08/2022 CLINICAL DATA:  Metastatic disease evaluation, posterior soft tissue mass/swelling, recent fall  EXAM: CT NECK WITHOUT CONTRAST TECHNIQUE: Multidetector CT imaging of the neck was performed following the standard protocol without intravenous contrast. RADIATION DOSE REDUCTION: This exam was performed according to the departmental dose-optimization program which includes automated exposure control, adjustment of the mA and/or kV according to patient size and/or use of iterative reconstruction technique. COMPARISON:  The prior neck CT from 09/19/2016 could not be retrieved. Correlation is made with PET-CT 02/12/2021 FINDINGS: Pharynx and larynx: Normal. No mass or swelling. Salivary glands: No inflammation, mass, or stone. Thyroid: Multiple hypoenhancing lesions in the thyroid, the largest of which measures up to 1.7 cm. Lymph nodes: None enlarged or abnormal density. Vascular: Aortic atherosclerosis. Calcifications are seen in the bilateral common and internal carotid arteries, as well as the intracranial left vertebral artery. Limited intracranial: Negative. Visualized orbits: Postsurgical changes in the bilateral globes. The orbits are otherwise unremarkable. Mastoids and visualized paranasal sinuses: Mucosal thickening in the ethmoid air cells. The mastoids are well aerated. Skeleton: Reversal of the normal cervical lordosis. No acute osseous abnormality. Upper chest: Moderate bilateral pleural effusions. Diffuse nodular opacities in the bilateral lungs, concerning for infection or aspiration but incompletely evaluated. Other: In the soft tissues of the posterior back, at the level of C6, there is a fluid density lesion, which appears tenuous with the skin, most likely an epidermal inclusion cyst. This lesion was also present on the 02/12/2021 PET scan, at which time it was not FDG avid. No other abnormality is seen in the soft tissues of the posterior neck. IMPRESSION: 1. No evidence of metastatic disease in the neck. 2. No concerning abnormality in the soft tissues of the posterior neck and back. The only  lesion seen is consistent with an epidermal inclusion cyst. 3. Diffuse nodular opacities in the bilateral lungs, with moderate bilateral pleural effusions, which are incompletely evaluated. A CT of the chest is recommended for further evaluation. 4. Multiple nodules in the thyroid, which measure up to 1.7 cm. If this has not previously been evaluated, a non-emergent ultrasound of the thyroid is recommended. (Reference: J Am Coll Radiol. 2015 Feb;12(2): 143-50) Electronically Signed   By: Merilyn Baba M.D.   On: 01/08/2022 18:58   DG Chest Port 1 View  Result Date: 01/05/2022 CLINICAL DATA:  Shortness of breath, hypoxia EXAM: PORTABLE CHEST 1 VIEW COMPARISON:  Chest radiographs, 01/03/2022, CT chest abdomen pelvis, 09/16/2021 FINDINGS: Cardiomegaly. Diffuse bilateral interstitial pulmonary opacity, increased compared to prior. Relatively focal airspace opacity of the right midlung, similar to prior. IMPRESSION: 1. Cardiomegaly with diffuse bilateral interstitial pulmonary opacity, increased compared to prior and most  consistent with edema. 2. Relatively focal airspace opacity of the right mid lung, which corresponds to a subpleural masslike opacity seen on prior CT. Electronically Signed   By: Delanna Ahmadi M.D.   On: 01/05/2022 18:11   ECHOCARDIOGRAM COMPLETE  Result Date: 01/06/2022    ECHOCARDIOGRAM REPORT   Patient Name:   James Sweeney Date of Exam: 01/06/2022 Medical Rec #:  [id number]       Height:       70.0 in Accession #:    [id number]      Weight:       183.3 lb Date of Birth:  10-Jul-1941       BSA:          2.011 m Patient Age:    29 years        BP:           145/64 mmHg Patient Gender: M               HR:           86 bpm. Exam Location:  Inpatient Procedure: 2D Echo Indications:    Cardiomegaly  History:        Patient has prior history of Echocardiogram examinations, most                 recent 01/23/2016. Signs/Symptoms:Shortness of Breath; Risk                 Factors:Diabetes, Dyslipidemia and  Hypertension.  Sonographer:    Arlyss Gandy Referring Phys: [place]  1. Left ventricular ejection fraction, by estimation, is 60 to 65%. The left ventricle has normal function. The left ventricle has no regional wall motion abnormalities. Left ventricular diastolic parameters are indeterminate.  2. Right ventricular systolic function is normal. The right ventricular size is normal. There is mildly elevated pulmonary artery systolic pressure. The estimated right ventricular systolic pressure is 57.0 mmHg.  3. Left atrial size was moderately dilated.  4. Right atrial size was mildly dilated.  5. The mitral valve is normal in structure. Trivial mitral valve regurgitation. No evidence of mitral stenosis.  6. The aortic valve is tricuspid. Aortic valve regurgitation is not visualized. No aortic stenosis is present.  7. The inferior vena cava is dilated in size with <50% respiratory variability, suggesting right atrial pressure of 15 mmHg.  8. The patient was in atrial fibrillation. FINDINGS  Left Ventricle: Left ventricular ejection fraction, by estimation, is 60 to 65%. The left ventricle has normal function. The left ventricle has no regional wall motion abnormalities. The left ventricular internal cavity size was normal in size. There is  no left ventricular hypertrophy. Left ventricular diastolic parameters are indeterminate. Right Ventricle: The right ventricular size is normal. No increase in right ventricular wall thickness. Right ventricular systolic function is normal. There is mildly elevated pulmonary artery systolic pressure. The tricuspid regurgitant velocity is 2.61  m/s, and with an assumed right atrial pressure of 15 mmHg, the estimated right ventricular systolic pressure is 17.7 mmHg. Left Atrium: Left atrial size was moderately dilated. Right Atrium: Right atrial size was mildly dilated. Pericardium: There is no evidence of pericardial effusion. Mitral Valve: The mitral  valve is normal in structure. Trivial mitral valve regurgitation. No evidence of mitral valve stenosis. Tricuspid Valve: The tricuspid valve is normal in structure. Tricuspid valve regurgitation is trivial. Aortic Valve: The aortic valve is tricuspid. Aortic valve regurgitation is not visualized. No aortic stenosis is present. Aortic valve  mean gradient measures 8.0 mmHg. Aortic valve peak gradient measures 13.5 mmHg. Aortic valve area, by VTI measures 1.89  cm. Pulmonic Valve: The pulmonic valve was normal in structure. Pulmonic valve regurgitation is trivial. Aorta: The aortic root is normal in size and structure. Venous: The inferior vena cava is dilated in size with less than 50% respiratory variability, suggesting right atrial pressure of 15 mmHg. IAS/Shunts: No atrial level shunt detected by color flow Doppler.  LEFT VENTRICLE PLAX 2D LVIDd:         5.35 cm   Diastology LVIDs:         3.75 cm   LV e' medial:    12.80 cm/s LV PW:         1.02 cm   LV E/e' medial:  9.7 LV IVS:        1.03 cm   LV e' lateral:   15.10 cm/s LVOT diam:     2.00 cm   LV E/e' lateral: 8.2 LV SV:         75 LV SV Index:   37 LVOT Area:     3.14 cm  RIGHT VENTRICLE RV Basal diam:  3.83 cm RV Mid diam:    3.03 cm RV S prime:     14.60 cm/s TAPSE (M-mode): 1.3 cm LEFT ATRIUM           Index        RIGHT ATRIUM           Index LA diam:      4.20 cm 2.09 cm/m   RA Area:     22.00 cm LA Vol (A2C): 56.1 ml 27.89 ml/m  RA Volume:   64.60 ml  32.12 ml/m LA Vol (A4C): 89.4 ml 44.45 ml/m  AORTIC VALVE AV Area (Vmax):    2.12 cm AV Area (Vmean):   2.00 cm AV Area (VTI):     1.89 cm AV Vmax:           184.00 cm/s AV Vmean:          131.000 cm/s AV VTI:            0.397 m AV Peak Grad:      13.5 mmHg AV Mean Grad:      8.0 mmHg LVOT Vmax:         124.00 cm/s LVOT Vmean:        83.200 cm/s LVOT VTI:          0.239 m LVOT/AV VTI ratio: 0.60  AORTA Ao Root diam: 3.20 cm Ao Asc diam:  3.20 cm MITRAL VALVE                TRICUSPID VALVE MV Area  (PHT): 4.21 cm     TR Peak grad:   27.2 mmHg MV Decel Time: 180 msec     TR Vmax:        261.00 cm/s MV E velocity: 124.00 cm/s                             SHUNTS                             Systemic VTI:  0.24 m                             Systemic Diam: 2.00 cm  [place] Electronically signed by Franki Monte Signature Date/Time: 01/06/2022/4:14:17 PM    Final      Subjective: No acute issues or events overnight   Discharge Exam: Vitals:   01/10/22 0843 01/10/22 1337  BP: (!) 141/56 (!) 133/53  Pulse:  70  Resp:    Temp:  97.6 F (36.4 C)  SpO2:  94%   Vitals:   01/10/22 0752 01/10/22 0810 01/10/22 0843 01/10/22 1337  BP:   (!) 141/56 (!) 133/53  Pulse: 77   70  Resp:      Temp:    97.6 F (36.4 C)  TempSrc:    Oral  SpO2: 97% 94%  94%  Weight:      Height:        General: Pt is alert, awake, not in acute distress Cardiovascular: RRR, S1/S2 +, no rubs, no gallops Respiratory: CTA bilaterally, no wheezing, no rhonchi Abdominal: Soft, NT, ND, bowel sounds + Extremities: no edema, no cyanosis    The results of significant diagnostics from this hospitalization (including imaging, microbiology, ancillary and laboratory) are listed below for reference.     Microbiology: Recent Results (from the past 240 hour(s))  Blood culture (routine x 2)     Status: None (Preliminary result)   Collection Time: 01/05/22  5:01 PM   Specimen: BLOOD  Result Value Ref Range Status   Specimen Description BLOOD SITE NOT SPECIFIED  Final   Special Requests   Final    BOTTLES DRAWN AEROBIC AND ANAEROBIC Blood Culture adequate volume   Culture   Final    NO GROWTH 4 DAYS Performed at [facility], [address] [place], [place] [address]    Report Status PENDING  Incomplete  Resp Panel by RT-PCR (Flu A&B, Covid) Nasopharyngeal Swab     Status: None   Collection Time: 01/05/22  6:55 PM   Specimen: Nasopharyngeal Swab; Nasopharyngeal(NP) swabs in vial transport medium   Result Value Ref Range Status   SARS Coronavirus 2 by RT PCR NEGATIVE NEGATIVE Final    Comment: (NOTE) SARS-CoV-2 target nucleic acids are NOT DETECTED.  The SARS-CoV-2 RNA is generally detectable in upper respiratory specimens during the acute phase of infection. The lowest concentration of SARS-CoV-2 viral copies this assay can detect is 138 copies/mL. A negative result does not preclude SARS-Cov-2 infection and should not be used as the sole basis for treatment or other patient management decisions. A negative result may occur with  improper specimen collection/handling, submission of specimen other than nasopharyngeal swab, presence of viral mutation(s) within the areas targeted by this assay, and inadequate number of viral copies(<138 copies/mL). A negative result must be combined with clinical observations, patient history, and epidemiological information. The expected result is Negative.  Fact Sheet for Patients:  EntrepreneurPulse.com.au  Fact Sheet for Healthcare Providers:  IncredibleEmployment.be  This test is no t yet approved or cleared by the [place] FDA and  has been authorized for detection and/or diagnosis of SARS-CoV-2 by FDA under an Emergency Use Authorization (EUA). This EUA will remain  in effect (meaning this test can be used) for the duration of the COVID-19 declaration under Section 564(b)(1) of the Act, 21 U.S.C.section 360bbb-3(b)(1), unless the authorization is terminated  or revoked sooner.       Influenza A by PCR NEGATIVE NEGATIVE Final   Influenza B by PCR NEGATIVE NEGATIVE Final    Comment: (NOTE) The Xpert Xpress SARS-CoV-2/FLU/RSV plus assay is intended as an aid in the diagnosis of  influenza from Nasopharyngeal swab specimens and should not be used as a sole basis for treatment. Nasal washings and aspirates are unacceptable for Xpert Xpress SARS-CoV-2/FLU/RSV testing.  Fact Sheet for  Patients: EntrepreneurPulse.com.au  Fact Sheet for Healthcare Providers: IncredibleEmployment.be  This test is not yet approved or cleared by the [place] FDA and has been authorized for detection and/or diagnosis of SARS-CoV-2 by FDA under an Emergency Use Authorization (EUA). This EUA will remain in effect (meaning this test can be used) for the duration of the COVID-19 declaration under Section 564(b)(1) of the Act, 21 U.S.C. section 360bbb-3(b)(1), unless the authorization is terminated or revoked.  Performed at [facility], [place] [address] [place], [place] [address]   Respiratory (~20 pathogens) panel by PCR     Status: Abnormal   Collection Time: 01/05/22  6:55 PM   Specimen: Nasopharyngeal Swab; Respiratory  Result Value Ref Range Status   Adenovirus NOT DETECTED NOT DETECTED Final   Coronavirus 229E NOT DETECTED NOT DETECTED Final    Comment: (NOTE) The Coronavirus on the Respiratory Panel, DOES NOT test for the novel  Coronavirus (2019 nCoV)    Coronavirus HKU1 NOT DETECTED NOT DETECTED Final   Coronavirus NL63 NOT DETECTED NOT DETECTED Final   Coronavirus OC43 NOT DETECTED NOT DETECTED Final   Metapneumovirus DETECTED (A) NOT DETECTED Final   Rhinovirus / Enterovirus NOT DETECTED NOT DETECTED Final   Influenza A NOT DETECTED NOT DETECTED Final   Influenza B NOT DETECTED NOT DETECTED Final   Parainfluenza Virus 1 NOT DETECTED NOT DETECTED Final   Parainfluenza Virus 2 NOT DETECTED NOT DETECTED Final   Parainfluenza Virus 3 NOT DETECTED NOT DETECTED Final   Parainfluenza Virus 4 NOT DETECTED NOT DETECTED Final   Respiratory Syncytial Virus NOT DETECTED NOT DETECTED Final   Bordetella pertussis NOT DETECTED NOT DETECTED Final   Bordetella Parapertussis NOT DETECTED NOT DETECTED Final   Chlamydophila pneumoniae NOT DETECTED NOT DETECTED Final   Mycoplasma pneumoniae NOT DETECTED NOT DETECTED Final    Comment: Performed at  [facility], [address], [place], [place] [address]  Blood culture (routine x 2)     Status: None (Preliminary result)   Collection Time: 01/05/22 10:31 PM   Specimen: BLOOD  Result Value Ref Range Status   Specimen Description BLOOD LEFT ANTECUBITAL  Final   Special Requests   Final    BOTTLES DRAWN AEROBIC AND ANAEROBIC Blood Culture results may not be optimal due to an inadequate volume of blood received in culture bottles   Culture   Final    NO GROWTH 4 DAYS Performed at [facility], [place] [address], [place], [place] [address]    Report Status PENDING  Incomplete  Expectorated Sputum Assessment w Gram Stain, Rflx to Resp Cult     Status: None   Collection Time: 01/05/22 11:45 PM   Specimen: Sputum  Result Value Ref Range Status   Specimen Description SPUTUM  Final   Special Requests NONE  Final   Sputum evaluation   Final    THIS SPECIMEN IS ACCEPTABLE FOR SPUTUM CULTURE Performed at [facility], [place] [address] [place], [place] [address]    Report Status 01/06/2022 FINAL  Final  Culture, Respiratory w Gram Stain     Status: None   Collection Time: 01/05/22 11:45 PM   Specimen: SPU  Result Value Ref Range Status   Specimen Description SPUTUM  Final   Special Requests NONE Reflexed from  [id number]  Final   Gram Stain   Final    FEW SQUAMOUS EPITHELIAL CELLS PRESENT FEW GRAM POSITIVE COCCI IN PAIRS FEW GRAM NEGATIVE RODS FEW GRAM VARIABLE ROD    Culture   Final    MODERATE Normal respiratory flora-no Staph aureus or Pseudomonas seen Performed at [facility], [place] [address], [place], [place] [address]    Report Status 01/08/2022 FINAL  Final     Labs: BNP (last 3 results) Recent Labs    01/05/22 1715  BNP 185.6*   Basic Metabolic Panel: Recent Labs  Lab 01/05/22 1715 01/05/22 2233 01/06/22 0239 01/07/22 0332 01/08/22 0308 01/09/22 0622  NA 128* 129* 131* 134* 133* 136  K 3.9 3.7 4.0 4.0 4.0 4.1  CL 97* 97* 101 103 103 102  CO2  _0 20* 23 25  GLUCOSE 164* 113* 115* 200* 178* 124*  BUN 26* 25* 23 21 28* 22  CREATININE 1.52* 1.41* 1.27* 1.20 1.20 1.13  CALCIUM 8.5* 8.3* 8.1* 7.9* 8.0* 8.1*  MG 1.9  --  1.9 2.1 2.3  --   PHOS 3.1  --  3.5  --   --   --    Liver Function Tests: Recent Labs  Lab 01/05/22 1715 01/06/22 0239 01/07/22 0332 01/08/22 0308  AST 55* 47* 55* 61*  ALT 52* 49* 65* 75*  ALKPHOS 78 69 74 72  BILITOT 1.4* 1.1 1.0 0.6  PROT 6.6 5.8* 5.9* 5.5*  ALBUMIN 3.7 3.2* 3.0* 2.9*   No results for input(s): LIPASE, AMYLASE in the last 168 hours. No results for input(s): AMMONIA in the last 168 hours. CBC: Recent Labs  Lab 01/05/22 2027 01/06/22 0239 01/07/22 0332 01/08/22 0308 01/09/22 0622  WBC 1.5* 1.3* 1.3* 1.8* 1.5*  NEUTROABS 1.2* 1.0* 1.2* 1.6*  --   HGB 8.2* 8.2* 8.9* 8.3* 8.8*  HCT 23.2* 24.2* 24.8* 23.5* 25.3*  MCV 111.5* 111.5* 110.2* 109.8* 110.5*  PLT 182 95* 92* 96* 123*   Cardiac Enzymes: Recent Labs  Lab 01/05/22 1715  CKTOTAL 142   BNP: Invalid input(s): POCBNP CBG: Recent Labs  Lab 01/05/22 2152 01/06/22 0815 01/06/22 1136 01/06/22 1649  GLUCAP 118* 125* 105* 113*   D-Dimer No results for input(s): DDIMER in the last 72 hours. Hgb A1c No results for input(s): HGBA1C in the last 72 hours. Lipid Profile No results for input(s): CHOL, HDL, LDLCALC, TRIG, CHOLHDL, LDLDIRECT in the last 72 hours. Thyroid function studies No results for input(s): TSH, T4TOTAL, T3FREE, THYROIDAB in the last 72 hours.  Invalid input(s): FREET3 Anemia work up No results for input(s): VITAMINB12, FOLATE, FERRITIN, TIBC, IRON, RETICCTPCT in the last 72 hours. Urinalysis    Component Value Date/Time   COLORURINE YELLOW 01/06/2022 0005   APPEARANCEUR HAZY (A) 01/06/2022 0005   LABSPEC 1.015 01/06/2022 0005   PHURINE 5.0 01/06/2022 0005   GLUCOSEU NEGATIVE 01/06/2022 0005   HGBUR NEGATIVE 01/06/2022 0005   BILIRUBINUR NEGATIVE 01/06/2022 0005   KETONESUR NEGATIVE  01/06/2022 0005   PROTEINUR 30 (A) 01/06/2022 0005   UROBILINOGEN 0.2 09/10/2010 1141   NITRITE NEGATIVE 01/06/2022 0005   LEUKOCYTESUR NEGATIVE 01/06/2022 0005   Sepsis Labs Invalid input(s): PROCALCITONIN,  WBC,  LACTICIDVEN Microbiology Recent Results (from the past 240 hour(s))  Blood culture (routine x 2)     Status: None (Preliminary result)   Collection Time: 01/05/22  5:01 PM   Specimen: BLOOD  Result Value Ref Range Status   Specimen Description BLOOD SITE NOT SPECIFIED  Final   Special Requests   Final    BOTTLES DRAWN AEROBIC AND ANAEROBIC Blood Culture adequate volume   Culture   Final    NO GROWTH 4 DAYS Performed at [facility], [address] [place], [place] [address]    Report Status PENDING  Incomplete  Resp Panel by RT-PCR (Flu A&B, Covid) Nasopharyngeal Swab     Status: None   Collection Time: 01/05/22  6:55 PM   Specimen: Nasopharyngeal Swab; Nasopharyngeal(NP) swabs in vial transport medium  Result Value Ref Range Status   SARS Coronavirus 2 by RT PCR NEGATIVE NEGATIVE Final    Comment: (NOTE) SARS-CoV-2 target nucleic acids are NOT DETECTED.  The SARS-CoV-2 RNA is generally detectable in upper respiratory specimens during the acute phase of infection. The lowest concentration of SARS-CoV-2 viral copies this assay can detect is 138 copies/mL. A negative result does not preclude SARS-Cov-2 infection and should not be used as the sole basis for treatment or other patient management decisions. A negative result may occur with  improper specimen collection/handling, submission of specimen other than nasopharyngeal swab, presence of viral mutation(s) within the areas targeted by this assay, and inadequate number of viral copies(<138 copies/mL). A negative result must be combined with clinical observations, patient history, and epidemiological information. The expected result is Negative.  Fact Sheet for Patients:   EntrepreneurPulse.com.au  Fact Sheet for Healthcare Providers:  IncredibleEmployment.be  This test is no t yet approved or cleared by the [place] FDA and  has been authorized for detection and/or diagnosis of SARS-CoV-2 by FDA under an Emergency Use Authorization (EUA). This EUA will remain  in effect (meaning this test can be used) for the duration of the COVID-19 declaration under Section 564(b)(1) of the Act, 21 U.S.C.section 360bbb-3(b)(1), unless the authorization is terminated  or revoked sooner.       Influenza A by PCR NEGATIVE NEGATIVE Final   Influenza B by PCR NEGATIVE NEGATIVE Final    Comment: (NOTE) The Xpert Xpress SARS-CoV-2/FLU/RSV plus assay is intended as an aid in the diagnosis of influenza from Nasopharyngeal swab specimens and should not be used as a sole basis for treatment. Nasal washings and aspirates are unacceptable for Xpert Xpress SARS-CoV-2/FLU/RSV testing.  Fact Sheet for Patients: EntrepreneurPulse.com.au  Fact Sheet for Healthcare Providers: IncredibleEmployment.be  This test is not yet approved or cleared by the [place] FDA and has been authorized for detection and/or diagnosis of SARS-CoV-2 by FDA under an Emergency Use Authorization (EUA). This EUA will remain in effect (meaning this test can be used) for the duration of the COVID-19 declaration under Section 564(b)(1) of the Act, 21 U.S.C. section 360bbb-3(b)(1), unless the authorization is terminated or revoked.  Performed at [facility], [place] [address], [place], [place] [address]   Respiratory (~20 pathogens) panel by PCR     Status: Abnormal   Collection Time: 01/05/22  6:55 PM   Specimen: Nasopharyngeal Swab; Respiratory  Result Value Ref Range Status   Adenovirus NOT DETECTED NOT DETECTED Final   Coronavirus 229E NOT DETECTED NOT DETECTED Final    Comment: (NOTE) The Coronavirus on the  Respiratory Panel, DOES NOT test for the novel  Coronavirus (2019 nCoV)    Coronavirus HKU1 NOT DETECTED NOT DETECTED Final   Coronavirus NL63 NOT DETECTED NOT DETECTED Final   Coronavirus OC43 NOT DETECTED NOT DETECTED Final   Metapneumovirus DETECTED (A) NOT DETECTED Final   Rhinovirus / Enterovirus NOT DETECTED NOT DETECTED Final  Influenza A NOT DETECTED NOT DETECTED Final   Influenza B NOT DETECTED NOT DETECTED Final   Parainfluenza Virus 1 NOT DETECTED NOT DETECTED Final   Parainfluenza Virus 2 NOT DETECTED NOT DETECTED Final   Parainfluenza Virus 3 NOT DETECTED NOT DETECTED Final   Parainfluenza Virus 4 NOT DETECTED NOT DETECTED Final   Respiratory Syncytial Virus NOT DETECTED NOT DETECTED Final   Bordetella pertussis NOT DETECTED NOT DETECTED Final   Bordetella Parapertussis NOT DETECTED NOT DETECTED Final   Chlamydophila pneumoniae NOT DETECTED NOT DETECTED Final   Mycoplasma pneumoniae NOT DETECTED NOT DETECTED Final    Comment: Performed at [facility], [place] [address], [place], [place] [address]  Blood culture (routine x 2)     Status: None (Preliminary result)   Collection Time: 01/05/22 10:31 PM   Specimen: BLOOD  Result Value Ref Range Status   Specimen Description BLOOD LEFT ANTECUBITAL  Final   Special Requests   Final    BOTTLES DRAWN AEROBIC AND ANAEROBIC Blood Culture results may not be optimal due to an inadequate volume of blood received in culture bottles   Culture   Final    NO GROWTH 4 DAYS Performed at [facility], [place] [address] [place], [place] [address]    Report Status PENDING  Incomplete  Expectorated Sputum Assessment w Gram Stain, Rflx to Resp Cult     Status: None   Collection Time: 01/05/22 11:45 PM   Specimen: Sputum  Result Value Ref Range Status   Specimen Description SPUTUM  Final   Special Requests NONE  Final   Sputum evaluation   Final    THIS SPECIMEN IS ACCEPTABLE FOR SPUTUM CULTURE Performed at [place], [place] [address], [place], [place] [address]    Report Status 01/06/2022 FINAL  Final  Culture, Respiratory w Gram Stain     Status: None   Collection Time: 01/05/22 11:45 PM   Specimen: SPU  Result Value Ref Range Status   Specimen Description SPUTUM  Final   Special Requests NONE Reflexed from [id number]  Final   Gram Stain   Final    FEW SQUAMOUS EPITHELIAL CELLS PRESENT FEW GRAM POSITIVE COCCI IN PAIRS FEW GRAM NEGATIVE RODS FEW GRAM VARIABLE ROD    Culture   Final    MODERATE Normal respiratory flora-no Staph aureus or Pseudomonas seen Performed at [facility], [place] [address], [place], [place] [address]    Report Status 01/08/2022 FINAL  Final     Time coordinating discharge: Over 30 minutes  SIGNED:   Little Ishikawa, DO [facility] 01/10/2022, 1:43 PM Pager   If 7PM-7AM, please contact night-coverage [email or web address]

## 2022-01-10 NOTE — Progress Notes (Signed)
Physical Therapy Treatment ?Patient Details ?Name: James Sweeney ?MRN: [id number] ?DOB: October 23, 1941 ?Today's Date: 01/10/2022 ? ? ?History of Present Illness 81 y/o male admitted 01/05/22 with SOB, cough. Found to have acute respiratory failure with hypoxia and CAP. Diagnosed with PNA recently, but did not finish antibiotics. PMH includes HOH, DM, HTN, persistent atrial fibrillation, MALT lymphoma. ? ?  ?PT Comments  ? ? Granddaughter present during session and educated for pt progression with walking program, repeated sit to stands and pulse ox for home monitoring to increase function. Pt able to walk with control of rollator after education for use. D/C plan remains appropriate ? ?93% on RA ?86% on RA with ambulation ?94% on 3L with ambulation ?   ?Recommendations for follow up therapy are one component of a multi-disciplinary discharge planning process, led by the attending physician.  Recommendations may be updated based on patient status, additional functional criteria and insurance authorization. ? ?Follow Up Recommendations ? Home health PT ?  ?  ?Assistance Recommended at Discharge Intermittent Supervision/Assistance  ?Patient can return home with the following A little help with walking and/or transfers;A little help with bathing/dressing/bathroom;Assistance with cooking/housework;Assist for transportation;Help with stairs or ramp for entrance ?  ?Equipment Recommendations ? BSC/3in1;Rollator (4 wheels)  ?  ?Recommendations for Other Services   ? ? ?  ?Precautions / Restrictions Precautions ?Precautions: Fall ?Precaution Comments: watch 02; very HoH  ?  ? ?Mobility ? Bed Mobility ?Overal bed mobility: Needs Assistance ?Bed Mobility: Sit to Supine ?  ?  ?  ?Sit to supine: Min assist ?  ?General bed mobility comments: cues for sit to sidely right with cues for transition from sidely to supine and education for family for assist at home with transitions ?  ? ?Transfers ?Overall transfer level: Needs assistance ?   ?Transfers: Sit to/from Stand ?Sit to Stand: Min guard ?  ?  ?  ?  ?  ?General transfer comment: cues for hand placement ?  ? ?Ambulation/Gait ?Ambulation/Gait assistance: Min guard ?Gait Distance (Feet): 120 Feet ?Assistive device: Rollator (4 wheels) ?Gait Pattern/deviations: Step-through pattern, Decreased stride length ?  ?Gait velocity interpretation: 1.31 - 2.62 ft/sec, indicative of limited community ambulator ?  ?General Gait Details: cues for posture and required 3L to maintain 93%. On RA pt with desaturation to 86%. Pt able to self direct distance ? ? ?Stairs ?  ?  ?  ?  ?  ? ? ?Wheelchair Mobility ?  ? ?Modified Rankin (Stroke Patients Only) ?  ? ? ?  ?Balance Overall balance assessment: Needs assistance ?Sitting-balance support: Feet supported, No upper extremity supported ?Sitting balance-Leahy Scale: Good ?Sitting balance - Comments: EOb without support ?  ?Standing balance support: During functional activity, Reliant on assistive device for balance ?Standing balance-Leahy Scale: Poor ?Standing balance comment: rollator for gait ?  ?  ?  ?  ?  ?  ?  ?  ?  ?  ?  ?  ? ?  ?Cognition Arousal/Alertness: Awake/alert ?Behavior During Therapy: [facility] for tasks assessed/performed ?Overall Cognitive Status: Difficult to assess ?  ?  ?  ?  ?  ?  ?  ?  ?  ?  ?  ?  ?  ?  ?  ?  ?General Comments: better hearing Left ear ?  ?  ? ?  ?Exercises   ? ?  ?General Comments   ?  ?  ? ?Pertinent Vitals/Pain Pain Assessment ?Pain Assessment: No/denies pain  ? ? ?Home Living   ?  ?  ?  ?  ?  ?  ?  ?  ?  ?   ?  ?  Prior Function    ?  ?  ?   ? ?PT Goals (current goals can now be found in the care plan section) Progress towards PT goals: Progressing toward goals ? ?  ?Frequency ? ? ? Min 3X/week ? ? ? ?  ?PT Plan Current plan remains appropriate  ? ? ?Co-evaluation   ?  ?  ?  ?  ? ?  ?AM-PAC PT "6 Clicks" Mobility   ?Outcome Measure ? Help needed turning from your back to your side while in a flat bed without using bedrails?: A  Little ?Help needed moving from lying on your back to sitting on the side of a flat bed without using bedrails?: A Little ?Help needed moving to and from a bed to a chair (including a wheelchair)?: A Little ?Help needed standing up from a chair using your arms (e.g., wheelchair or bedside chair)?: A Little ?Help needed to walk in hospital room?: A Little ?Help needed climbing 3-5 steps with a railing? : A Little ?6 Click Score: 18 ? ?  ?End of Session Equipment Utilized During Treatment: Oxygen ?Activity Tolerance: Patient tolerated treatment well ?Patient left: in bed;with call bell/phone within reach;with family/visitor present ?Nurse Communication: Mobility status ?PT Visit Diagnosis: Muscle weakness (generalized) (M62.81);Difficulty in walking, not elsewhere classified (R26.2);Other (comment) ?  ? ? ?Time: 3662-9476 ?PT Time Calculation (min) (ACUTE ONLY): 31 min ? ?Charges:  $Gait Training: 8-22 mins ?$Therapeutic Activity: 8-22 mins          ?          ? ?Hafsa Lohn P, PT ?Acute Rehabilitation Services ?Pager: [phone] ?Office: [phone] ? ? ? ?James Sweeney ?01/10/2022, 1:06 PM ? ?

## 2022-01-10 NOTE — TOC Transition Note (Addendum)
Transition of Care (TOC) - CM/SW Discharge Note ? ? ?Patient Details  ?Name: James Sweeney ?MRN: [id number] ?Date of Birth: 1941-07-21 ? ?Transition of Care (TOC) CM/SW Contact:  ?Graves-Bigelow, Ocie Cornfield, RN ?Phone Number: ?01/10/2022, 12:23 PM ? ? ?Clinical Narrative:  Case Manager spoke to granddaughter regarding durable medical equipment and a rollator and bedside commode has been ordered via [employer]. DME will be delivered to the room prior to transition home. No further needs from Case Manager at this time.   ? ? ?01-10-22 1344 Oxygen ordered for this patient for home via [employer]. 02 will be delivered to the room. No further needs from Case Manager at this time.  ? ?Final next level of care: Outlook ?Barriers to Discharge: No Barriers Identified ? ? ?Patient Goals and CMS Choice ?Patient states their goals for this hospitalization and ongoing recovery are:: return home ?CMS Medicare.gov Compare Post Acute Care list provided to:: Patient Represenative (must comment) ?  ?Discharge Plan and Services ?In-house Referral: NA ?Discharge Planning Services: CM Consult ?Post Acute Care Choice: Home Health          ?DME Arranged: Walker rolling with seat, Bedside commode ?DME Agency: [employer] ?Date DME Agency Contacted: 01/10/22 ?Time DME Agency Contacted: [phone] ?Representative spoke with at DME Agency: Freda Munro. ?HH Arranged: PT, OT, Nurse's Aide ?[place] Agency: [place] ?Date HH Agency Contacted: 01/07/22 ?Time [place]: 8469 ?Representative spoke with at [place]: [name] ? ?Readmission Risk Interventions ?No flowsheet data found. ? ? ? ? ?

## 2022-01-10 NOTE — Progress Notes (Addendum)
?  01/10/22 0845  ?Clinical Encounter Type  ?Visited With Patient and family together;Health care provider ?(Notary: Helene Kelp; Volunteer: Oleh Genin; Volunteer: Jannette Spanner)  ?Visit Type Follow-up ?Music therapist)  ?Referral From Nurse ?Cicero Duck, RN)  ?Consult/Referral To Chaplain ?Albertina Parr [place])  ? ?Requested to coordinate a Notary and two volunteers from [employer] to witness Mr. Atzel Mccambridge signing of  Advance Directive: [place] and Living Will. Met with Mr.  Aument at his bedside. Patient indicated that his wishes for Milagros Loll [phone] and [facility] [phone] were clearly indicated on PART A: H.C. P.O.A. and that his wishes were clearly indicated by his initials on PART B: Living Will.  ? ?Patient signed his Advance Directive in the presence of [place]; and volunteer witnesses Oleh Genin and Jannette Spanner, all of whom have signed in the appropriate spaces on Part: C.  ? ? A copy of Mr. Kissick' A.D. was placed in his hard chart, a copy was emailed for scanning into his electronic medical record, the original and two copies were then returned to the patient. Total time 1.5 hours. ? ? [place], M. Min. [phone].  ?

## 2022-01-10 NOTE — Progress Notes (Signed)
SATURATION QUALIFICATIONS: (This note is used to comply with regulatory documentation for home oxygen) ? ?Patient Saturations on Room Air at Rest = 93% ? ?Patient Saturations on Room Air while Ambulating = 88% ? ?Patient Saturations on 2 Liters of oxygen while Ambulating = 93% ? ?Please briefly explain why patient needs home oxygen:Pt's O2 sat decreases to 88% on RA. ?

## 2022-01-15 ENCOUNTER — Telehealth (HOSPITAL_COMMUNITY): Payer: Self-pay | Admitting: *Deleted

## 2022-01-15 DIAGNOSIS — E7800 Pure hypercholesterolemia, unspecified: Secondary | ICD-10-CM | POA: Diagnosis not present

## 2022-01-15 DIAGNOSIS — I5032 Chronic diastolic (congestive) heart failure: Secondary | ICD-10-CM | POA: Diagnosis not present

## 2022-01-15 DIAGNOSIS — N179 Acute kidney failure, unspecified: Secondary | ICD-10-CM | POA: Diagnosis not present

## 2022-01-15 DIAGNOSIS — I130 Hypertensive heart and chronic kidney disease with heart failure and stage 1 through stage 4 chronic kidney disease, or unspecified chronic kidney disease: Secondary | ICD-10-CM | POA: Diagnosis not present

## 2022-01-15 DIAGNOSIS — Z9181 History of falling: Secondary | ICD-10-CM | POA: Diagnosis not present

## 2022-01-15 DIAGNOSIS — I4821 Permanent atrial fibrillation: Secondary | ICD-10-CM | POA: Diagnosis not present

## 2022-01-15 DIAGNOSIS — N401 Enlarged prostate with lower urinary tract symptoms: Secondary | ICD-10-CM | POA: Diagnosis not present

## 2022-01-15 DIAGNOSIS — D696 Thrombocytopenia, unspecified: Secondary | ICD-10-CM | POA: Diagnosis not present

## 2022-01-15 DIAGNOSIS — N1831 Chronic kidney disease, stage 3a: Secondary | ICD-10-CM | POA: Diagnosis not present

## 2022-01-15 DIAGNOSIS — D630 Anemia in neoplastic disease: Secondary | ICD-10-CM | POA: Diagnosis not present

## 2022-01-15 DIAGNOSIS — Z7901 Long term (current) use of anticoagulants: Secondary | ICD-10-CM | POA: Diagnosis not present

## 2022-01-15 DIAGNOSIS — H9190 Unspecified hearing loss, unspecified ear: Secondary | ICD-10-CM | POA: Diagnosis not present

## 2022-01-15 DIAGNOSIS — C884 Extranodal marginal zone B-cell lymphoma of mucosa-associated lymphoid tissue [MALT-lymphoma]: Secondary | ICD-10-CM | POA: Diagnosis not present

## 2022-01-15 DIAGNOSIS — E1122 Type 2 diabetes mellitus with diabetic chronic kidney disease: Secondary | ICD-10-CM | POA: Diagnosis not present

## 2022-01-15 DIAGNOSIS — R338 Other retention of urine: Secondary | ICD-10-CM | POA: Diagnosis not present

## 2022-01-15 DIAGNOSIS — J123 Human metapneumovirus pneumonia: Secondary | ICD-10-CM | POA: Diagnosis not present

## 2022-01-15 DIAGNOSIS — Z9981 Dependence on supplemental oxygen: Secondary | ICD-10-CM | POA: Diagnosis not present

## 2022-01-15 DIAGNOSIS — D631 Anemia in chronic kidney disease: Secondary | ICD-10-CM | POA: Diagnosis not present

## 2022-01-15 NOTE — Telephone Encounter (Signed)
James Sweeney' grandson James Sweeney was contacted by telephone to verify understanding of discharge instructions status post their most recent discharge from the hospital on the date:  01/10/22.  Inpatient discharge AVS was re-reviewed with patient, along with cancer center appointments.  Verification of understanding for oncology specific follow-up was validated using the Teach Back method.  Patient is still c/o being weak, however PT is scheduled to come to the home today for therapy, otherwise states he is doing well. ? ?Transportation to appointments were confirmed for the patient as being self/caregiver. ? ?[name]?s grandson's questions were addressed to their satisfaction upon completion of this post discharge follow-up call for outpatient oncology.  ?

## 2022-01-16 DIAGNOSIS — I5032 Chronic diastolic (congestive) heart failure: Secondary | ICD-10-CM | POA: Diagnosis not present

## 2022-01-16 DIAGNOSIS — H9190 Unspecified hearing loss, unspecified ear: Secondary | ICD-10-CM | POA: Diagnosis not present

## 2022-01-16 DIAGNOSIS — I130 Hypertensive heart and chronic kidney disease with heart failure and stage 1 through stage 4 chronic kidney disease, or unspecified chronic kidney disease: Secondary | ICD-10-CM | POA: Diagnosis not present

## 2022-01-16 DIAGNOSIS — R338 Other retention of urine: Secondary | ICD-10-CM | POA: Diagnosis not present

## 2022-01-16 DIAGNOSIS — D696 Thrombocytopenia, unspecified: Secondary | ICD-10-CM | POA: Diagnosis not present

## 2022-01-16 DIAGNOSIS — Z9981 Dependence on supplemental oxygen: Secondary | ICD-10-CM | POA: Diagnosis not present

## 2022-01-16 DIAGNOSIS — C884 Extranodal marginal zone B-cell lymphoma of mucosa-associated lymphoid tissue [MALT-lymphoma]: Secondary | ICD-10-CM | POA: Diagnosis not present

## 2022-01-16 DIAGNOSIS — Z9181 History of falling: Secondary | ICD-10-CM | POA: Diagnosis not present

## 2022-01-16 DIAGNOSIS — D630 Anemia in neoplastic disease: Secondary | ICD-10-CM | POA: Diagnosis not present

## 2022-01-16 DIAGNOSIS — N179 Acute kidney failure, unspecified: Secondary | ICD-10-CM | POA: Diagnosis not present

## 2022-01-16 DIAGNOSIS — E7800 Pure hypercholesterolemia, unspecified: Secondary | ICD-10-CM | POA: Diagnosis not present

## 2022-01-16 DIAGNOSIS — D631 Anemia in chronic kidney disease: Secondary | ICD-10-CM | POA: Diagnosis not present

## 2022-01-16 DIAGNOSIS — J123 Human metapneumovirus pneumonia: Secondary | ICD-10-CM | POA: Diagnosis not present

## 2022-01-16 DIAGNOSIS — N401 Enlarged prostate with lower urinary tract symptoms: Secondary | ICD-10-CM | POA: Diagnosis not present

## 2022-01-16 DIAGNOSIS — I4821 Permanent atrial fibrillation: Secondary | ICD-10-CM | POA: Diagnosis not present

## 2022-01-16 DIAGNOSIS — R531 Weakness: Secondary | ICD-10-CM | POA: Diagnosis not present

## 2022-01-16 DIAGNOSIS — E1122 Type 2 diabetes mellitus with diabetic chronic kidney disease: Secondary | ICD-10-CM | POA: Diagnosis not present

## 2022-01-16 DIAGNOSIS — Z7901 Long term (current) use of anticoagulants: Secondary | ICD-10-CM | POA: Diagnosis not present

## 2022-01-16 DIAGNOSIS — N1831 Chronic kidney disease, stage 3a: Secondary | ICD-10-CM | POA: Diagnosis not present

## 2022-01-17 DIAGNOSIS — N1831 Chronic kidney disease, stage 3a: Secondary | ICD-10-CM | POA: Diagnosis not present

## 2022-01-17 DIAGNOSIS — I10 Essential (primary) hypertension: Secondary | ICD-10-CM | POA: Diagnosis not present

## 2022-01-17 DIAGNOSIS — Z8701 Personal history of pneumonia (recurrent): Secondary | ICD-10-CM | POA: Diagnosis not present

## 2022-01-17 DIAGNOSIS — J9601 Acute respiratory failure with hypoxia: Secondary | ICD-10-CM | POA: Diagnosis not present

## 2022-01-17 DIAGNOSIS — C884 Extranodal marginal zone B-cell lymphoma of mucosa-associated lymphoid tissue [MALT-lymphoma]: Secondary | ICD-10-CM | POA: Diagnosis not present

## 2022-01-17 DIAGNOSIS — N179 Acute kidney failure, unspecified: Secondary | ICD-10-CM | POA: Diagnosis not present

## 2022-01-17 DIAGNOSIS — E1169 Type 2 diabetes mellitus with other specified complication: Secondary | ICD-10-CM | POA: Diagnosis not present

## 2022-01-21 ENCOUNTER — Other Ambulatory Visit: Payer: Self-pay | Admitting: Cardiovascular Disease

## 2022-01-28 ENCOUNTER — Ambulatory Visit: Payer: PPO | Admitting: Hematology

## 2022-01-28 ENCOUNTER — Ambulatory Visit: Payer: PPO | Admitting: Physician Assistant

## 2022-01-28 ENCOUNTER — Other Ambulatory Visit: Payer: PPO

## 2022-01-28 ENCOUNTER — Other Ambulatory Visit: Payer: Self-pay

## 2022-01-28 ENCOUNTER — Encounter: Payer: Self-pay | Admitting: Physician Assistant

## 2022-01-28 VITALS — BP 136/52 | HR 70 | Ht 70.0 in | Wt 184.0 lb

## 2022-01-28 DIAGNOSIS — I2510 Atherosclerotic heart disease of native coronary artery without angina pectoris: Secondary | ICD-10-CM

## 2022-01-28 DIAGNOSIS — I10 Essential (primary) hypertension: Secondary | ICD-10-CM | POA: Diagnosis not present

## 2022-01-28 DIAGNOSIS — E785 Hyperlipidemia, unspecified: Secondary | ICD-10-CM | POA: Diagnosis not present

## 2022-01-28 DIAGNOSIS — I4821 Permanent atrial fibrillation: Secondary | ICD-10-CM

## 2022-01-28 NOTE — Progress Notes (Signed)
?Cardiology Office Note:   ? ?Date:  01/30/2022  ? ?ID:  James Sweeney, DOB [birth year], MRN [id number] ? ?PCP:  James Huxley, PA ?  ?[facility] Providers ?Cardiologist:  James Klein, MD    ? ?Referring MD: James Huxley, PA  ? ?Chief Complaint  ?Patient presents with  ? Follow-up  ?  Post hospital. ?  ? ? ?History of Present Illness:   ? ?James Sweeney is a 81 y.o. male with a hx of persistent atrial fibrillation, coronary artery calcification, hypertension, hyperlipidemia, hard of hearing, and lymphoma. Patient was incidentally noted to have coronary artery calcification on previous CT scan in 2017 for which he underwent nuclear stress test and echocardiogram, both of which were normal.  Echocardiogram did reveal mild LVH, left atrial dilatation and aortic valve atherosclerosis.  Cardioversion was offered to him in the past however he preferred conservative management.  Patient was last seen by Dr. Sallyanne Sweeney in January 2023 at which point he was maintaining atrial fibrillation however rate controlled.  More recently, patient was admitted in early March 2023 with shortness of breath, cough and chills.  He was empirically treated with IV Rocephin and azithromycin for community-acquired pneumonia.  Viral panel was positive for metapneumovirus.  Hospital course was complicated by AKI that was treated with IV fluid. ? ?Patient presents today for follow-up.  Blood pressure and heart rate is well controlled.  His breathing has improved.  He denies any recent chest pain or worsening dyspnea.  He can follow-up in 6 months. ? ?Past Medical History:  ?Diagnosis Date  ? Cervical spondylosis without myelopathy 12/01/2013  ? Diabetes mellitus   ? borderline  ? Enlarged prostate   ? Frequency of urination   ? Hearing deficit   ? Heart murmur   ? History of kidney stones   ? removed with Lithotripsy  ? HOH (hard of hearing)   ? right ear  ? Hyperlipemia   ? Hypertension   ? Lumbago 12/01/2013  ? Pneumonia 2016  ? Polio    ? right leg smaller than left  ? Shortness of breath dyspnea   ? with exertion  ? ? ?Past Surgical History:  ?Procedure Laterality Date  ? CATARACT EXTRACTION Bilateral 2014  ? LUNG BIOPSY Right 2018  ? March  ? VIDEO ASSISTED THORACOSCOPY (VATS)/WEDGE RESECTION Right 04/12/2018  ? Procedure: VIDEO ASSISTED THORACOSCOPY (VATS), WEDGE RESECTION RIGHT MIDDLE LOBE, PLACEMENT OF ON Q CATHETER;  Surgeon: [name], MD;  Location: [place];  Service: Thoracic;  Laterality: Right;  ? VIDEO BRONCHOSCOPY N/A 04/12/2018  ? Procedure: VIDEO BRONCHOSCOPY;  Surgeon: [name], MD;  Location: [place];  Service: Thoracic;  Laterality: N/A;  ? VIDEO BRONCHOSCOPY WITH ENDOBRONCHIAL NAVIGATION N/A 01/25/2016  ? Procedure: VIDEO BRONCHOSCOPY WITH ENDOBRONCHIAL NAVIGATION;  Surgeon: [name], MD;  Location: [place];  Service: Thoracic;  Laterality: N/A;  ? VIDEO BRONCHOSCOPY WITH ENDOBRONCHIAL ULTRASOUND N/A 01/25/2016  ? Procedure: VIDEO BRONCHOSCOPY WITH ENDOBRONCHIAL ULTRASOUND;  Surgeon: [name], MD;  Location: [place];  Service: Thoracic;  Laterality: N/A;  ? ? ?Current Medications: ?Current Meds  ?Medication Sig  ? alfuzosin (UROXATRAL) 10 MG 24 hr tablet Take 10 mg by mouth at bedtime.   ? Ascorbic Acid (SUPER C COMPLEX PO) Take 1 capsule by mouth every evening. Super C 1000  ? atorvastatin (LIPITOR) 20 MG tablet TAKE 1 TABLET BY MOUTH EVERY DAY (Patient taking differently: Take 20 mg by mouth daily.)  ? Calcium  Carb-Cholecalciferol (CALCIUM 600+D3 PO) Take 1 tablet by mouth every evening.  ? Cyanocobalamin (VITAMIN B-12) 5000 MCG SUBL Place 5,000 mcg under the tongue every evening.  ? diltiazem (CARDIZEM CD) 120 MG 24 hr capsule Take 1 capsule (120 mg total) by mouth daily.  ? Ferrous Sulfate (IRON) 28 MG TABS Take 28 mg by mouth every evening.  ? finasteride (PROSCAR) 5 MG tablet Take 5 mg by mouth at bedtime.   ? fluticasone (FLONASE) 50 MCG/ACT nasal spray Place 2 sprays into both nostrils daily.  ?  guaiFENesin-dextromethorphan (ROBITUSSIN DM) 100-10 MG/5ML syrup Take 15 mLs by mouth every 4 (four) hours as needed for cough.  ? hydrocortisone (ANUSOL-HC) 25 MG suppository Place 1 suppository (25 mg total) rectally 2 (two) times daily. (Patient taking differently: Place 25 mg rectally daily as needed for hemorrhoids.)  ? loratadine (CLARITIN) 10 MG tablet Take 1 tablet (10 mg total) by mouth daily.  ? Omega-3 Fatty Acids (FISH OIL) 1200 MG CAPS Take 1,200 mg by mouth every evening.  ? ondansetron (ZOFRAN) 8 MG tablet Take 1 tablet (8 mg total) by mouth every 8 (eight) hours as needed for nausea.  ? pantoprazole (PROTONIX) 40 MG tablet Take 1 tablet (40 mg total) by mouth daily at 6 (six) AM.  ? Polyethyl Glycol-Propyl Glycol 0.4-0.3 % SOLN Place 1 drop into both eyes 2 (two) times daily as needed (for dry/irritated eyes.).  ? traMADol (ULTRAM) 50 MG tablet Take 1 tablet (50 mg total) by mouth every 6 (six) hours as needed (pain). (Patient taking differently: Take 25 mg by mouth 2 (two) times daily.)  ? triamcinolone ointment (KENALOG) 0.5 % APPLY TO ITCHY RASH ON EXTREMITIES AND WAIST TWICE A DAY FOR 15 DAYS. DO NOT APPLY TO FACE  ? XARELTO 20 MG TABS tablet TAKE 1 TABLET BY MOUTH DAILY WITH SUPPER  ?  ? ?Allergies:   Hydrocodone-acetaminophen, Losartan potassium-hctz, Levaquin [levofloxacin in d5w], and Penicillins  ? ?Social History  ? ?Socioeconomic History  ? Marital status: Married  ?  Spouse name: Not on file  ? Number of children: 2  ? Years of education: Not on file  ? Highest education level: Not on file  ?Occupational History  ? Not on file  ?Tobacco Use  ? Smoking status: Former  ?  Packs/day: 0.50  ?  Years: 9.00  ?  Pack years: 4.50  ?  Types: Cigarettes  ?  Quit date: 12/02/1967  ?  Years since quitting: 54.2  ? Smokeless tobacco: Former  ?  Types: Chew  ?  Quit date: 2000  ?Vaping Use  ? Vaping Use: Never used  ?Substance and Sexual Activity  ? Alcohol use: No  ? Drug use: No  ? Sexual activity:  Never  ?Other Topics Concern  ? Not on file  ?Social History Narrative  ? Patient is right handed.  ? Patient drinks 1 cup caffeine daily.  ?   ? Pt has been married for 59 years and has 2 children, 4 grandchildren, and 3 great-grandchildren. Lives with everyone except great-grandchildren. He does not clean house, but can shop, do yard work, and drive. 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  ?   ?   ? Epworth Sleepiness Scale Score:  8  ?   ? --I have HTN  ? --I seem to be losing my sex drive  ? --I wake up to urinate frequently at night  ? --I awake feeling not rested  ? --  I have borderline diabetes  ? ?Social Determinants of Health  ? ?Financial Resource Strain: Not on file  ?Food Insecurity: Not on file  ?Transportation Needs: Not on file  ?Physical Activity: Not on file  ?Stress: Not on file  ?Social Connections: Not on file  ?  ? ?Family History: ?The patient's family history includes Cancer in his mother; Cancer - Lung in his father; Diabetes in his brother; Heart attack in his brother. ? ?ROS:   ?Please see the history of present illness.    ? All other systems reviewed and are negative. ? ?EKGs/Labs/Other Studies Reviewed:   ? ?The following studies were reviewed today: ? ?Echo 01/06/2022 ? 1. Left ventricular ejection fraction, by estimation, is 60 to 65%. The  ?left ventricle has normal function. The left ventricle has no regional  ?wall motion abnormalities. Left ventricular diastolic parameters are  ?indeterminate.  ? 2. Right ventricular systolic function is normal. The right ventricular  ?size is normal. There is mildly elevated pulmonary artery systolic  ?pressure. The estimated right ventricular systolic pressure is 83.1 mmHg.  ? 3. Left atrial size was moderately dilated.  ? 4. Right atrial size was mildly dilated.  ? 5. The mitral valve is normal in structure. Trivial mitral valve  ?regurgitation. No evidence of mitral stenosis.  ? 6. The aortic valve is tricuspid. Aortic  valve regurgitation is not  ?visualized. No aortic stenosis is present.  ? 7. The inferior vena cava is dilated in size with <50% respiratory  ?variability, suggesting right atrial pressure of 15 mmHg.  ? 8.

## 2022-01-28 NOTE — Patient Instructions (Signed)

## 2022-02-05 DIAGNOSIS — I4821 Permanent atrial fibrillation: Secondary | ICD-10-CM | POA: Diagnosis not present

## 2022-02-05 DIAGNOSIS — E7800 Pure hypercholesterolemia, unspecified: Secondary | ICD-10-CM | POA: Diagnosis not present

## 2022-02-05 DIAGNOSIS — N1831 Chronic kidney disease, stage 3a: Secondary | ICD-10-CM | POA: Diagnosis not present

## 2022-02-05 DIAGNOSIS — I130 Hypertensive heart and chronic kidney disease with heart failure and stage 1 through stage 4 chronic kidney disease, or unspecified chronic kidney disease: Secondary | ICD-10-CM | POA: Diagnosis not present

## 2022-02-05 DIAGNOSIS — I5032 Chronic diastolic (congestive) heart failure: Secondary | ICD-10-CM | POA: Diagnosis not present

## 2022-02-05 DIAGNOSIS — R338 Other retention of urine: Secondary | ICD-10-CM | POA: Diagnosis not present

## 2022-02-05 DIAGNOSIS — C884 Extranodal marginal zone B-cell lymphoma of mucosa-associated lymphoid tissue [MALT-lymphoma]: Secondary | ICD-10-CM | POA: Diagnosis not present

## 2022-02-05 DIAGNOSIS — D696 Thrombocytopenia, unspecified: Secondary | ICD-10-CM | POA: Diagnosis not present

## 2022-02-05 DIAGNOSIS — Z9981 Dependence on supplemental oxygen: Secondary | ICD-10-CM | POA: Diagnosis not present

## 2022-02-05 DIAGNOSIS — Z9181 History of falling: Secondary | ICD-10-CM | POA: Diagnosis not present

## 2022-02-05 DIAGNOSIS — H9190 Unspecified hearing loss, unspecified ear: Secondary | ICD-10-CM | POA: Diagnosis not present

## 2022-02-05 DIAGNOSIS — Z7901 Long term (current) use of anticoagulants: Secondary | ICD-10-CM | POA: Diagnosis not present

## 2022-02-05 DIAGNOSIS — D630 Anemia in neoplastic disease: Secondary | ICD-10-CM | POA: Diagnosis not present

## 2022-02-05 DIAGNOSIS — D631 Anemia in chronic kidney disease: Secondary | ICD-10-CM | POA: Diagnosis not present

## 2022-02-05 DIAGNOSIS — N179 Acute kidney failure, unspecified: Secondary | ICD-10-CM | POA: Diagnosis not present

## 2022-02-05 DIAGNOSIS — N401 Enlarged prostate with lower urinary tract symptoms: Secondary | ICD-10-CM | POA: Diagnosis not present

## 2022-02-05 DIAGNOSIS — J123 Human metapneumovirus pneumonia: Secondary | ICD-10-CM | POA: Diagnosis not present

## 2022-02-05 DIAGNOSIS — E1122 Type 2 diabetes mellitus with diabetic chronic kidney disease: Secondary | ICD-10-CM | POA: Diagnosis not present

## 2022-02-16 DIAGNOSIS — R531 Weakness: Secondary | ICD-10-CM | POA: Diagnosis not present

## 2022-03-18 DIAGNOSIS — R531 Weakness: Secondary | ICD-10-CM | POA: Diagnosis not present

## 2022-04-04 DIAGNOSIS — M5412 Radiculopathy, cervical region: Secondary | ICD-10-CM | POA: Diagnosis not present

## 2022-04-04 DIAGNOSIS — E782 Mixed hyperlipidemia: Secondary | ICD-10-CM | POA: Diagnosis not present

## 2022-04-04 DIAGNOSIS — I700 Atherosclerosis of aorta: Secondary | ICD-10-CM | POA: Diagnosis not present

## 2022-04-04 DIAGNOSIS — I4891 Unspecified atrial fibrillation: Secondary | ICD-10-CM | POA: Diagnosis not present

## 2022-04-04 DIAGNOSIS — N1831 Chronic kidney disease, stage 3a: Secondary | ICD-10-CM | POA: Diagnosis not present

## 2022-04-04 DIAGNOSIS — C8582 Other specified types of non-Hodgkin lymphoma, intrathoracic lymph nodes: Secondary | ICD-10-CM | POA: Diagnosis not present

## 2022-04-04 DIAGNOSIS — E1169 Type 2 diabetes mellitus with other specified complication: Secondary | ICD-10-CM | POA: Diagnosis not present

## 2022-04-04 DIAGNOSIS — D649 Anemia, unspecified: Secondary | ICD-10-CM | POA: Diagnosis not present

## 2022-04-04 DIAGNOSIS — I10 Essential (primary) hypertension: Secondary | ICD-10-CM | POA: Diagnosis not present

## 2022-04-04 DIAGNOSIS — M545 Low back pain, unspecified: Secondary | ICD-10-CM | POA: Diagnosis not present

## 2022-04-04 DIAGNOSIS — H9193 Unspecified hearing loss, bilateral: Secondary | ICD-10-CM | POA: Diagnosis not present

## 2022-04-04 DIAGNOSIS — N400 Enlarged prostate without lower urinary tract symptoms: Secondary | ICD-10-CM | POA: Diagnosis not present

## 2022-04-09 ENCOUNTER — Other Ambulatory Visit: Payer: Self-pay | Admitting: Oncology

## 2022-04-18 DIAGNOSIS — R531 Weakness: Secondary | ICD-10-CM | POA: Diagnosis not present

## 2022-05-18 DIAGNOSIS — R531 Weakness: Secondary | ICD-10-CM | POA: Diagnosis not present

## 2022-06-05 DIAGNOSIS — R0902 Hypoxemia: Secondary | ICD-10-CM | POA: Diagnosis not present

## 2022-06-05 DIAGNOSIS — E782 Mixed hyperlipidemia: Secondary | ICD-10-CM | POA: Diagnosis not present

## 2022-06-05 DIAGNOSIS — I10 Essential (primary) hypertension: Secondary | ICD-10-CM | POA: Diagnosis not present

## 2022-06-05 DIAGNOSIS — H9193 Unspecified hearing loss, bilateral: Secondary | ICD-10-CM | POA: Diagnosis not present

## 2022-06-05 DIAGNOSIS — N400 Enlarged prostate without lower urinary tract symptoms: Secondary | ICD-10-CM | POA: Diagnosis not present

## 2022-06-05 DIAGNOSIS — C8582 Other specified types of non-Hodgkin lymphoma, intrathoracic lymph nodes: Secondary | ICD-10-CM | POA: Diagnosis not present

## 2022-06-05 DIAGNOSIS — E1169 Type 2 diabetes mellitus with other specified complication: Secondary | ICD-10-CM | POA: Diagnosis not present

## 2022-06-05 DIAGNOSIS — M5451 Vertebrogenic low back pain: Secondary | ICD-10-CM | POA: Diagnosis not present

## 2022-06-05 DIAGNOSIS — I4891 Unspecified atrial fibrillation: Secondary | ICD-10-CM | POA: Diagnosis not present

## 2022-06-18 DIAGNOSIS — R531 Weakness: Secondary | ICD-10-CM | POA: Diagnosis not present

## 2022-06-25 DIAGNOSIS — R351 Nocturia: Secondary | ICD-10-CM | POA: Diagnosis not present

## 2022-06-25 DIAGNOSIS — H906 Mixed conductive and sensorineural hearing loss, bilateral: Secondary | ICD-10-CM | POA: Diagnosis not present

## 2022-06-25 DIAGNOSIS — N401 Enlarged prostate with lower urinary tract symptoms: Secondary | ICD-10-CM | POA: Diagnosis not present

## 2022-07-03 DIAGNOSIS — E782 Mixed hyperlipidemia: Secondary | ICD-10-CM | POA: Diagnosis not present

## 2022-07-03 DIAGNOSIS — N400 Enlarged prostate without lower urinary tract symptoms: Secondary | ICD-10-CM | POA: Diagnosis not present

## 2022-07-03 DIAGNOSIS — I10 Essential (primary) hypertension: Secondary | ICD-10-CM | POA: Diagnosis not present

## 2022-07-10 ENCOUNTER — Encounter: Payer: Self-pay | Admitting: Cardiovascular Disease

## 2022-07-10 ENCOUNTER — Ambulatory Visit: Payer: PPO | Attending: Cardiovascular Disease | Admitting: Cardiovascular Disease

## 2022-07-10 VITALS — BP 133/60 | HR 78 | Ht 70.0 in | Wt 186.2 lb

## 2022-07-10 DIAGNOSIS — I2510 Atherosclerotic heart disease of native coronary artery without angina pectoris: Secondary | ICD-10-CM | POA: Diagnosis not present

## 2022-07-10 DIAGNOSIS — I4821 Permanent atrial fibrillation: Secondary | ICD-10-CM

## 2022-07-10 DIAGNOSIS — D6869 Other thrombophilia: Secondary | ICD-10-CM

## 2022-07-10 DIAGNOSIS — C8580 Other specified types of non-Hodgkin lymphoma, unspecified site: Secondary | ICD-10-CM

## 2022-07-10 DIAGNOSIS — R918 Other nonspecific abnormal finding of lung field: Secondary | ICD-10-CM | POA: Diagnosis not present

## 2022-07-10 DIAGNOSIS — M545 Low back pain, unspecified: Secondary | ICD-10-CM | POA: Diagnosis not present

## 2022-07-10 DIAGNOSIS — G8929 Other chronic pain: Secondary | ICD-10-CM

## 2022-07-10 DIAGNOSIS — E7800 Pure hypercholesterolemia, unspecified: Secondary | ICD-10-CM

## 2022-07-10 DIAGNOSIS — I10 Essential (primary) hypertension: Secondary | ICD-10-CM | POA: Diagnosis not present

## 2022-07-10 MED ORDER — TRAMADOL HCL 50 MG PO TABS: 50.0000 mg | ORAL_TABLET | Freq: Four times a day (QID) | ORAL | 3 refills | Status: DC | PRN

## 2022-07-10 NOTE — Progress Notes (Signed)
Patient ID: James Sweeney, male   DOB: 09/03/1941, 81 y.o.   MRN: [id number]     Cardiology Office Note    Date:  07/10/2022   ID:  James Sweeney, DOB [birth year], MRN [id number]  PCP:  Lois Huxley, PA  Cardiologist:   Sanda Klein, MD   Chief Complaint  Patient presents with   Atrial Fibrillation    History of Present Illness:  James Sweeney is a 81 y.o. male with longstanding persistent atrial fibrillation, numerous coronary risk factors,  benign 2017 workup for coronary artery calcification seen on chest CT, performed for lung mass, returning for follow-up (repeat biopsy of the lung mass shows that he has a MALT lymphoma, currently being treated with observation).  He was rehospitalized with acute respiratory failure and hypoxia due to pneumonia in March.  He really has not recovered from that.  He is still on oxygen which he uses with activity and at night.  He is accompanied today by his son.  As before, the patient is extremely hard of hearing and has not found a successful hearing aid.  In part, he reads lips.  He has longstanding asymptomatic atrial fibrillation that is well rate controlled on a low-dose of sustained release diltiazem.  He is on anticoagulation with Xarelto and has not had any bleeding problems, falls or serious injuries.  He does not have a history of stroke or TIA.  He is on chronic statin therapy for hypercholesterolemia, he does have evidence of coronary and aortic atherosclerosis/calcifications on imaging studies, but does not have clinically significant CAD or PAD.  He does have a history of diabetes mellitus, but this is currently very well controlled without medications with a hemoglobin A1c of only 5.5% a couple of months ago.  He does not have orthopnea, PND or lower extremity edema.  He only has shortness of breath with activity.  He has not heard himself wheezing.  He does not cough and does not have chest pain.  He has symptoms of symmetrical  peripheral neuropathy of the lower extremities and "cold feet all the time" but a previous duplex ultrasound showed no evidence of arterial obstruction (07/29/2019).    When coronary artery calcifications were incidentally noted on chest CT in 2017 he underwent a nuclear stress test and echocardiogram.  Neither study showed any evidence of coronary insufficiency.  He does have mild LVH and left atrial dilation and aortic valve sclerosis.  Cardioversion has been offered in the past but he has preferred conservative management.  Since his hematology appointment was due in late March, when he was hospitalized, he has not had oncology follow-up for his right lung MALT lymphoma this year.   Past Medical History:  Diagnosis Date   Cervical spondylosis without myelopathy 12/01/2013   Diabetes mellitus    borderline   Enlarged prostate    Frequency of urination    Hearing deficit    Heart murmur    History of kidney stones    removed with Lithotripsy   HOH (hard of hearing)    right ear   Hyperlipemia    Hypertension    Lumbago 12/01/2013   Pneumonia 2016   Polio    right leg smaller than left   Shortness of breath dyspnea    with exertion    Past Surgical History:  Procedure Laterality Date   CATARACT EXTRACTION Bilateral 2014   LUNG BIOPSY Right 2018   March   VIDEO ASSISTED THORACOSCOPY (VATS)/WEDGE RESECTION Right  04/12/2018   Procedure: VIDEO ASSISTED THORACOSCOPY (VATS), WEDGE RESECTION RIGHT MIDDLE LOBE, PLACEMENT OF ON Q CATHETER;  Surgeon: [name], MD;  Location: [facility];  Service: Thoracic;  Laterality: Right;   VIDEO BRONCHOSCOPY N/A 04/12/2018   Procedure: VIDEO BRONCHOSCOPY;  Surgeon: [name], MD;  Location: [facility];  Service: Thoracic;  Laterality: N/A;   VIDEO BRONCHOSCOPY WITH ENDOBRONCHIAL NAVIGATION N/A 01/25/2016   Procedure: VIDEO BRONCHOSCOPY WITH ENDOBRONCHIAL NAVIGATION;  Surgeon: [name], MD;  Location: [facility];  Service: Thoracic;   Laterality: N/A;   VIDEO BRONCHOSCOPY WITH ENDOBRONCHIAL ULTRASOUND N/A 01/25/2016   Procedure: VIDEO BRONCHOSCOPY WITH ENDOBRONCHIAL ULTRASOUND;  Surgeon: [name], MD;  Location: [facility];  Service: Thoracic;  Laterality: N/A;    Outpatient Medications Prior to Visit  Medication Sig Dispense Refill   alfuzosin (UROXATRAL) 10 MG 24 hr tablet Take 10 mg by mouth at bedtime.      atorvastatin (LIPITOR) 20 MG tablet TAKE 1 TABLET BY MOUTH EVERY DAY (Patient taking differently: Take 20 mg by mouth daily.) 90 tablet 1   Calcium Carb-Cholecalciferol (CALCIUM 600+D3 PO) Take 1 tablet by mouth every evening.     diltiazem (CARDIZEM CD) 120 MG 24 hr capsule Take 1 capsule (120 mg total) by mouth daily. 30 capsule 0   finasteride (PROSCAR) 5 MG tablet Take 5 mg by mouth at bedtime.      fluticasone (FLONASE) 50 MCG/ACT nasal spray Place 2 sprays into both nostrils daily. 9.9 mL 0   loratadine (CLARITIN) 10 MG tablet Take 1 tablet (10 mg total) by mouth daily. 30 tablet 0   Omega-3 Fatty Acids (FISH OIL) 1200 MG CAPS Take 1,200 mg by mouth every evening.     valsartan (DIOVAN) 160 MG tablet Take 160 mg by mouth daily.     XARELTO 20 MG TABS tablet TAKE 1 TABLET BY MOUTH DAILY WITH SUPPER 30 tablet 5   traMADol (ULTRAM) 50 MG tablet Take 25 mg by mouth 2 (two) times daily.     Ascorbic Acid (SUPER C COMPLEX PO) Take 1 capsule by mouth every evening. Super C 1000 (Patient not taking: Reported on 07/10/2022)     Cyanocobalamin (VITAMIN B-12) 5000 MCG SUBL Place 5,000 mcg under the tongue every evening. (Patient not taking: Reported on 07/10/2022)     Ferrous Sulfate (IRON) 28 MG TABS Take 28 mg by mouth every evening. (Patient not taking: Reported on 07/10/2022)     guaiFENesin-dextromethorphan (ROBITUSSIN DM) 100-10 MG/5ML syrup Take 15 mLs by mouth every 4 (four) hours as needed for cough. (Patient not taking: Reported on 07/10/2022) 118 mL 0   hydrocortisone (ANUSOL-HC) 25 MG suppository Place 1 suppository  (25 mg total) rectally 2 (two) times daily. (Patient not taking: Reported on 07/10/2022) 12 suppository 0   ondansetron (ZOFRAN) 8 MG tablet Take 1 tablet (8 mg total) by mouth every 8 (eight) hours as needed for nausea. (Patient not taking: Reported on 07/10/2022) 20 tablet 0   pantoprazole (PROTONIX) 40 MG tablet Take 1 tablet (40 mg total) by mouth daily at 6 (six) AM. (Patient not taking: Reported on 07/10/2022) 30 tablet 0   Polyethyl Glycol-Propyl Glycol 0.4-0.3 % SOLN Place 1 drop into both eyes 2 (two) times daily as needed (for dry/irritated eyes.). (Patient not taking: Reported on 07/10/2022)     triamcinolone ointment (KENALOG) 0.5 % APPLY TO ITCHY RASH ON EXTREMITIES AND WAIST TWICE A DAY FOR 15 DAYS. DO NOT APPLY TO FACE (Patient not taking:  Reported on 07/10/2022) 30 g 0   traMADol (ULTRAM) 50 MG tablet Take 1 tablet (50 mg total) by mouth every 6 (six) hours as needed (pain). (Patient not taking: Reported on 07/10/2022) 28 tablet 0   No facility-administered medications prior to visit.     Allergies:   Hydrocodone-acetaminophen, Losartan potassium-hctz, Levaquin [levofloxacin in d5w], and Penicillins   Social History   Socioeconomic History   Marital status: Married    Spouse name: Not on file   Number of children: 2   Years of education: Not on file   Highest education level: Not on file  Occupational History   Not on file  Tobacco Use   Smoking status: Former    Packs/day: 0.50    Years: 9.00    Total pack years: 4.50    Types: Cigarettes    Quit date: 12/02/1967    Years since quitting: 54.6   Smokeless tobacco: Former    Types: Chew    Quit date: 2000  Vaping Use   Vaping Use: Never used  Substance and Sexual Activity   Alcohol use: No   Drug use: No   Sexual activity: Never  Other Topics Concern   Not on file  Social History Narrative   Patient is right handed.   Patient drinks 1 cup caffeine daily.      Pt has been married for 96 years and has 2 children, 4  grandchildren, and 3 great-grandchildren. Lives with everyone except great-grandchildren. He does not clean house, but can shop, do yard work, and drive. 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         Epworth Sleepiness Scale Score:  8      --I have HTN   --I seem to be losing my sex drive   --I wake up to urinate frequently at night   --I awake feeling not rested   --I have borderline diabetes   Social Determinants of Health   Financial Resource Strain: Not on file  Food Insecurity: Not on file  Transportation Needs: Not on file  Physical Activity: Not on file  Stress: Not on file  Social Connections: Not on file     Family History:  The patient's family history includes Cancer in his mother; Cancer - Lung in his father; Diabetes in his brother; Heart attack in his brother.   ROS:   Please see the history of present illness.   All other systems are reviewed and are negative.   PHYSICAL EXAM:   VS:  BP 133/60   Pulse 78   Ht '5\' 10"'$  (1.778 m)   Wt 186 lb 3.2 oz (84.5 kg)   SpO2 96%   BMI 26.72 kg/m      General: Alert, oriented x3, no distress, wearing oxygen at 1.5 L/min Head: no evidence of trauma, PERRL, EOMI, no exophtalmos or lid lag, no myxedema, no xanthelasma; normal ears, nose and oropharynx Neck: normal jugular venous pulsations and no hepatojugular reflux; brisk carotid pulses without delay and no carotid bruits Chest: Absent breath sounds in the lower half of his right lung with dullness to percussion and absent fremitus.  Otherwise normal lung exam Cardiovascular: normal position and quality of the apical impulse, irregular rhythm, normal first and second heart sounds, 1/6 short aortic ejection murmur at the right upper sternal border, no diastolic murmurs, rubs or gallops Abdomen: no tenderness or distention, no masses by palpation, no abnormal pulsatility or arterial bruits, normal bowel sounds, no  hepatosplenomegaly Extremities: no  clubbing, cyanosis or edema; 2+ radial, ulnar and brachial pulses bilaterally; 2+ right femoral, posterior tibial and dorsalis pedis pulses; 2+ left femoral, posterior tibial and dorsalis pedis pulses; no subclavian or femoral bruits Neurological: grossly nonfocal Psych: Normal mood and affect    Wt Readings from Last 3 Encounters:  07/10/22 186 lb 3.2 oz (84.5 kg)  01/28/22 184 lb (83.5 kg)  01/10/22 183 lb 6.8 oz (83.2 kg)      Studies/Labs Reviewed:  09 26 2020 lower extremity arterial Dopplers  without evidence of obstruction  ECHO 2017: - Left ventricle: The cavity size was normal. There was mild    concentric hypertrophy. Systolic function was vigorous. The    estimated ejection fraction was in the range of 65% to 70%. Wall    motion was normal; there were no regional wall motion    abnormalities. Doppler parameters are consistent with abnormal    left ventricular relaxation (grade 1 diastolic dysfunction).    There was no evidence of elevated ventricular filling pressure by    Doppler parameters.  - Aortic valve: Trileaflet; normal thickness leaflets. Valve area    (Vmax): 1.58 cm^2.  - Aortic root: The aortic root was normal in size.  - Left atrium: The atrium was mildly dilated.  - Right ventricle: Systolic function was normal.  - Right atrium: The atrium was normal in size.  - Tricuspid valve: There was trivial regurgitation.  - Pulmonary arteries: Systolic pressure was within the normal    range.  - Inferior vena cava: The vessel was normal in size.  - Pericardium, extracardiac: There was no pericardial effusion.   Lexiscan nuclear perfusion study 2017:  The left ventricular ejection fraction is normal (55-65%). Nuclear stress EF: 57%. The study is normal. This is a low risk study. There was no ST segment deviation noted during stress.   Low risk stress nuclear study with normal perfusion and normal left ventricular regional and global systolic function.       EKG:  EKG is ordered today.  It shows atrial fibrillation with controlled ventricular response and a single wide QRS beat, aberrant conduction versus PVC he has minor nonspecific ST segment changes 01/05/2022: B Natriuretic Peptide 216.0 01/06/2022: TSH 1.255 01/08/2022: ALT 75; Magnesium 2.3 01/09/2022: BUN 22; Creatinine, Ser 1.13; Hemoglobin 8.8; Platelets 123; Potassium 4.1; Sodium 136   Lipid Panel No results found for: "CHOL", "TRIG", "HDL", "CHOLHDL", "VLDL", "LDLCALC", "LDLDIRECT", "LABVLDL"  10/04/2021 Cholesterol 109, HDL 56, LDL 44, triglycerides 109 Hemoglobin A1c 5.8% Creatinine 1.06, BUN 15  04/04/2022 Cholesterol 116, HDL 52, LDL 55, triglycerides 32, hemoglobin A1c 5.5% Hemoglobin 8.4, platelets 112 K Creatinine 1.13, potassium 4.3, ALT 23  ASSESSMENT:    1. Permanent atrial fibrillation ([place])   2. Acquired thrombophilia ([place])   3. Coronary artery calcification seen on CT scan   4. 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 Essential hypertension   6. Hypercholesterolemia   7. Chronic bilateral low back pain, unspecified whether sciatica present   8. Lung mass        PLAN:  In order of problems listed above:   AFib: Permanent arrhythmia, well rate controlled on a low-dose of diltiazem.  He probably also has underlying AV conduction system disease.   CHADSVasc at least 3, possibly as high as 5 (age 54, HTN, +/-CAD, +/- history of DM).  No history of known embolic events. Anticoagulation: No bleeding complications. Coronary calcification: Asymptomatic/has never had angina. Normal Lexiscan Myoview in March  2017.   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  Based on his physical exam, I am very concerned that there has been significant progression of disease in his right lower lung.  We will repeat a chest x-ray.  His worsening macrocytic anemia and leukopenia may also be related to lymphoma.  He has a lot of  problems with low back pain.  I refilled his tramadol.  He only takes 25 mg twice daily at this point. HTN: A little elevated on initial evaluation, but better when retested. HLP: On statin.  LDL within target range less than 70.  Medication Adjustments/Labs and Tests Ordered: Current medicines are reviewed at length with the patient today.  Concerns regarding medicines are outlined above.  Medication changes, Labs and Tests ordered today are listed in the Patient Instructions below. Patient Instructions  Medication Instructions:  No changes *If you need a refill on your cardiac medications before your next appointment, please call your pharmacy*   Lab Work: None ordered If you have labs (blood work) drawn today and your tests are completely normal, you will receive your results only by: [place] (if you have MyChart) OR A paper copy in the mail If you have any lab test that is abnormal or we need to change your treatment, we will call you to review the results.   Testing/Procedures: A chest x-ray takes a picture of the organs and structures inside the chest, including the heart, lungs, and blood vessels. This test can show several things, including, whether the heart is enlarges; whether fluid is building up in the lungs; and whether pacemaker / defibrillator leads are still in place.  This can be done at [facility], [place] [address]. You do not need an appointment.   Follow-Up: At [facility], you and your health needs are our priority.  As part of our continuing mission to provide you with exceptional heart care, we have created designated Provider Care Teams.  These Care Teams include your primary Cardiologist (physician) and Advanced Practice Providers (APPs -  Physician Assistants and Nurse Practitioners) who all work together to provide you with the care you need, when you need it.  We recommend signing up for the patient portal called "MyChart".  Sign up  information is provided on this After Visit Summary.  MyChart is used to connect with patients for Virtual Visits (Telemedicine).  Patients are able to view lab/test results, encounter notes, upcoming appointments, etc.  Non-urgent messages can be sent to your provider as well.   To learn more about what you can do with MyChart, go to [email or web address].    Your next appointment:   6 month(s)  The format for your next appointment:   In Person  Provider:   Sanda Klein, MD     Other Instructions Make an appointment with Dr. Irene Limbo       Signed, Sanda Klein, MD  07/10/2022 7:00 PM    [address], [place], [place]  [address] Phone: [phone]; Fax: [phone]

## 2022-07-10 NOTE — Patient Instructions (Signed)
Medication Instructions:  No changes *If you need a refill on your cardiac medications before your next appointment, please call your pharmacy*   Lab Work: None ordered If you have labs (blood work) drawn today and your tests are completely normal, you will receive your results only by: [place] (if you have MyChart) OR A paper copy in the mail If you have any lab test that is abnormal or we need to change your treatment, we will call you to review the results.   Testing/Procedures: A chest x-ray takes a picture of the organs and structures inside the chest, including the heart, lungs, and blood vessels. This test can show several things, including, whether the heart is enlarges; whether fluid is building up in the lungs; and whether pacemaker / defibrillator leads are still in place.  This can be done at [facility], [place] [address]. You do not need an appointment.   Follow-Up: At [facility], you and your health needs are our priority.  As part of our continuing mission to provide you with exceptional heart care, we have created designated Provider Care Teams.  These Care Teams include your primary Cardiologist (physician) and Advanced Practice Providers (APPs -  Physician Assistants and Nurse Practitioners) who all work together to provide you with the care you need, when you need it.  We recommend signing up for the patient portal called "MyChart".  Sign up information is provided on this After Visit Summary.  MyChart is used to connect with patients for Virtual Visits (Telemedicine).  Patients are able to view lab/test results, encounter notes, upcoming appointments, etc.  Non-urgent messages can be sent to your provider as well.   To learn more about what you can do with MyChart, go to [email or web address].    Your next appointment:   6 month(s)  The format for your next appointment:   In Person  Provider:   Sanda Klein, MD     Other  Instructions Make an appointment with Dr. Irene Limbo

## 2022-07-31 ENCOUNTER — Ambulatory Visit: Payer: PPO | Admitting: Cardiovascular Disease

## 2022-08-04 ENCOUNTER — Other Ambulatory Visit: Payer: Self-pay | Admitting: Cardiovascular Disease

## 2022-08-04 DIAGNOSIS — I4821 Permanent atrial fibrillation: Secondary | ICD-10-CM

## 2022-08-04 NOTE — Telephone Encounter (Signed)
Xarelto '20mg'$  refill request received. Pt is 81 years old, weight-84.5kg, Crea-1.13 on 01/09/2022, last seen by Dr. Sallyanne Kuster on 07/10/2022, Diagnosis-Afib, [place].28 mL/min; Dose is appropriate based on dosing criteria. Will send in refill to requested pharmacy.

## 2022-08-05 DIAGNOSIS — N400 Enlarged prostate without lower urinary tract symptoms: Secondary | ICD-10-CM | POA: Diagnosis not present

## 2022-08-05 DIAGNOSIS — E782 Mixed hyperlipidemia: Secondary | ICD-10-CM | POA: Diagnosis not present

## 2022-08-05 DIAGNOSIS — I10 Essential (primary) hypertension: Secondary | ICD-10-CM | POA: Diagnosis not present

## 2022-10-01 DIAGNOSIS — E782 Mixed hyperlipidemia: Secondary | ICD-10-CM | POA: Diagnosis not present

## 2022-10-01 DIAGNOSIS — N400 Enlarged prostate without lower urinary tract symptoms: Secondary | ICD-10-CM | POA: Diagnosis not present

## 2022-10-01 DIAGNOSIS — I10 Essential (primary) hypertension: Secondary | ICD-10-CM | POA: Diagnosis not present

## 2022-10-07 DIAGNOSIS — I10 Essential (primary) hypertension: Secondary | ICD-10-CM | POA: Diagnosis not present

## 2022-10-07 DIAGNOSIS — C8582 Other specified types of non-Hodgkin lymphoma, intrathoracic lymph nodes: Secondary | ICD-10-CM | POA: Diagnosis not present

## 2022-10-07 DIAGNOSIS — Z0000 Encounter for general adult medical examination without abnormal findings: Secondary | ICD-10-CM | POA: Diagnosis not present

## 2022-10-07 DIAGNOSIS — N1831 Chronic kidney disease, stage 3a: Secondary | ICD-10-CM | POA: Diagnosis not present

## 2022-10-07 DIAGNOSIS — D649 Anemia, unspecified: Secondary | ICD-10-CM | POA: Diagnosis not present

## 2022-10-07 DIAGNOSIS — I4891 Unspecified atrial fibrillation: Secondary | ICD-10-CM | POA: Diagnosis not present

## 2022-10-07 DIAGNOSIS — Z1331 Encounter for screening for depression: Secondary | ICD-10-CM | POA: Diagnosis not present

## 2022-10-07 DIAGNOSIS — E782 Mixed hyperlipidemia: Secondary | ICD-10-CM | POA: Diagnosis not present

## 2022-10-07 DIAGNOSIS — I700 Atherosclerosis of aorta: Secondary | ICD-10-CM | POA: Diagnosis not present

## 2022-10-07 DIAGNOSIS — M5412 Radiculopathy, cervical region: Secondary | ICD-10-CM | POA: Diagnosis not present

## 2022-10-07 DIAGNOSIS — H9193 Unspecified hearing loss, bilateral: Secondary | ICD-10-CM | POA: Diagnosis not present

## 2022-10-07 DIAGNOSIS — E1169 Type 2 diabetes mellitus with other specified complication: Secondary | ICD-10-CM | POA: Diagnosis not present

## 2022-10-08 DIAGNOSIS — E782 Mixed hyperlipidemia: Secondary | ICD-10-CM | POA: Diagnosis not present

## 2022-10-08 DIAGNOSIS — I10 Essential (primary) hypertension: Secondary | ICD-10-CM | POA: Diagnosis not present

## 2022-10-10 ENCOUNTER — Telehealth: Payer: Self-pay | Admitting: Hematology

## 2022-10-10 NOTE — Telephone Encounter (Signed)
Spoke with patient confirming new patient appointments 1/2

## 2022-10-30 ENCOUNTER — Other Ambulatory Visit: Payer: Self-pay

## 2022-10-30 DIAGNOSIS — C8580 Other specified types of non-Hodgkin lymphoma, unspecified site: Secondary | ICD-10-CM

## 2022-11-04 ENCOUNTER — Inpatient Hospital Stay: Payer: Medicare PPO | Attending: Hematology | Admitting: Hematology

## 2022-11-04 ENCOUNTER — Inpatient Hospital Stay: Payer: Medicare PPO

## 2022-11-05 ENCOUNTER — Telehealth: Payer: Self-pay | Admitting: Hematology

## 2022-11-05 NOTE — Telephone Encounter (Signed)
Called patient to r/s missed appointment from 1/2. Spoke with patient's spouse that stated grandson will call back to give new appointment request.

## 2022-11-07 ENCOUNTER — Other Ambulatory Visit: Payer: Self-pay

## 2022-11-07 ENCOUNTER — Encounter (HOSPITAL_COMMUNITY): Payer: Self-pay

## 2022-11-07 ENCOUNTER — Emergency Department (HOSPITAL_COMMUNITY): Payer: Medicare PPO

## 2022-11-07 ENCOUNTER — Inpatient Hospital Stay (HOSPITAL_COMMUNITY)
Admission: EM | Admit: 2022-11-07 | Discharge: 2022-11-09 | DRG: 189 | Disposition: A | Discharge status: Home Health | Payer: Medicare PPO | Attending: Internal Medicine | Admitting: Internal Medicine

## 2022-11-07 DIAGNOSIS — N400 Enlarged prostate without lower urinary tract symptoms: Secondary | ICD-10-CM | POA: Diagnosis present

## 2022-11-07 DIAGNOSIS — J189 Pneumonia, unspecified organism: Secondary | ICD-10-CM | POA: Diagnosis not present

## 2022-11-07 DIAGNOSIS — Z7901 Long term (current) use of anticoagulants: Secondary | ICD-10-CM

## 2022-11-07 DIAGNOSIS — I10 Essential (primary) hypertension: Secondary | ICD-10-CM | POA: Diagnosis not present

## 2022-11-07 DIAGNOSIS — E7800 Pure hypercholesterolemia, unspecified: Secondary | ICD-10-CM | POA: Diagnosis present

## 2022-11-07 DIAGNOSIS — I5033 Acute on chronic diastolic (congestive) heart failure: Secondary | ICD-10-CM | POA: Diagnosis present

## 2022-11-07 DIAGNOSIS — I4821 Permanent atrial fibrillation: Secondary | ICD-10-CM | POA: Diagnosis present

## 2022-11-07 DIAGNOSIS — H9190 Unspecified hearing loss, unspecified ear: Secondary | ICD-10-CM | POA: Diagnosis present

## 2022-11-07 DIAGNOSIS — Z79899 Other long term (current) drug therapy: Secondary | ICD-10-CM | POA: Diagnosis not present

## 2022-11-07 DIAGNOSIS — J918 Pleural effusion in other conditions classified elsewhere: Secondary | ICD-10-CM | POA: Diagnosis present

## 2022-11-07 DIAGNOSIS — Z8612 Personal history of poliomyelitis: Secondary | ICD-10-CM

## 2022-11-07 DIAGNOSIS — C884 Extranodal marginal zone B-cell lymphoma of mucosa-associated lymphoid tissue [MALT-lymphoma]: Secondary | ICD-10-CM | POA: Diagnosis not present

## 2022-11-07 DIAGNOSIS — D61818 Other pancytopenia: Secondary | ICD-10-CM | POA: Diagnosis not present

## 2022-11-07 DIAGNOSIS — R0602 Shortness of breath: Principal | ICD-10-CM

## 2022-11-07 DIAGNOSIS — Z9981 Dependence on supplemental oxygen: Secondary | ICD-10-CM | POA: Diagnosis not present

## 2022-11-07 DIAGNOSIS — J90 Pleural effusion, not elsewhere classified: Secondary | ICD-10-CM

## 2022-11-07 DIAGNOSIS — Z888 Allergy status to other drugs, medicaments and biological substances status: Secondary | ICD-10-CM | POA: Diagnosis not present

## 2022-11-07 DIAGNOSIS — J9601 Acute respiratory failure with hypoxia: Principal | ICD-10-CM | POA: Diagnosis present

## 2022-11-07 DIAGNOSIS — G9341 Metabolic encephalopathy: Secondary | ICD-10-CM | POA: Diagnosis not present

## 2022-11-07 DIAGNOSIS — E1122 Type 2 diabetes mellitus with diabetic chronic kidney disease: Secondary | ICD-10-CM | POA: Diagnosis present

## 2022-11-07 DIAGNOSIS — Z880 Allergy status to penicillin: Secondary | ICD-10-CM

## 2022-11-07 DIAGNOSIS — Z885 Allergy status to narcotic agent status: Secondary | ICD-10-CM

## 2022-11-07 DIAGNOSIS — C8580 Other specified types of non-Hodgkin lymphoma, unspecified site: Secondary | ICD-10-CM | POA: Diagnosis not present

## 2022-11-07 DIAGNOSIS — R0603 Acute respiratory distress: Secondary | ICD-10-CM | POA: Diagnosis not present

## 2022-11-07 DIAGNOSIS — R000 Tachycardia, unspecified: Secondary | ICD-10-CM | POA: Diagnosis not present

## 2022-11-07 DIAGNOSIS — Z87891 Personal history of nicotine dependence: Secondary | ICD-10-CM

## 2022-11-07 DIAGNOSIS — Z833 Family history of diabetes mellitus: Secondary | ICD-10-CM

## 2022-11-07 DIAGNOSIS — Z1152 Encounter for screening for COVID-19: Secondary | ICD-10-CM | POA: Diagnosis not present

## 2022-11-07 DIAGNOSIS — N1831 Chronic kidney disease, stage 3a: Secondary | ICD-10-CM | POA: Diagnosis present

## 2022-11-07 DIAGNOSIS — Z8249 Family history of ischemic heart disease and other diseases of the circulatory system: Secondary | ICD-10-CM

## 2022-11-07 DIAGNOSIS — J9621 Acute and chronic respiratory failure with hypoxia: Secondary | ICD-10-CM | POA: Diagnosis not present

## 2022-11-07 DIAGNOSIS — Z7189 Other specified counseling: Secondary | ICD-10-CM | POA: Diagnosis not present

## 2022-11-07 DIAGNOSIS — Z881 Allergy status to other antibiotic agents status: Secondary | ICD-10-CM | POA: Diagnosis not present

## 2022-11-07 DIAGNOSIS — R410 Disorientation, unspecified: Secondary | ICD-10-CM | POA: Diagnosis not present

## 2022-11-07 DIAGNOSIS — Z8701 Personal history of pneumonia (recurrent): Secondary | ICD-10-CM

## 2022-11-07 DIAGNOSIS — I130 Hypertensive heart and chronic kidney disease with heart failure and stage 1 through stage 4 chronic kidney disease, or unspecified chronic kidney disease: Secondary | ICD-10-CM | POA: Diagnosis not present

## 2022-11-07 DIAGNOSIS — E119 Type 2 diabetes mellitus without complications: Secondary | ICD-10-CM

## 2022-11-07 DIAGNOSIS — I4891 Unspecified atrial fibrillation: Secondary | ICD-10-CM | POA: Diagnosis not present

## 2022-11-07 LAB — PROTIME-INR
INR: 3.8 — ABNORMAL HIGH (ref 0.8–1.2)
Prothrombin Time: 37.5 seconds — ABNORMAL HIGH (ref 11.4–15.2)

## 2022-11-07 LAB — CBC WITH DIFFERENTIAL/PLATELET
Abs Immature Granulocytes: 0.01 10*3/uL (ref 0.00–0.07)
Basophils Absolute: 0 10*3/uL (ref 0.0–0.1)
Basophils Relative: 0 %
Eosinophils Absolute: 0 10*3/uL (ref 0.0–0.5)
Eosinophils Relative: 1 %
HCT: 16.4 % — ABNORMAL LOW (ref 39.0–52.0)
Hemoglobin: 5.4 g/dL — CL (ref 13.0–17.0)
Immature Granulocytes: 1 %
Lymphocytes Relative: 11 %
Lymphs Abs: 0.2 10*3/uL — ABNORMAL LOW (ref 0.7–4.0)
MCH: 40.9 pg — ABNORMAL HIGH (ref 26.0–34.0)
MCHC: 32.9 g/dL (ref 30.0–36.0)
MCV: 124.2 fL — ABNORMAL HIGH (ref 80.0–100.0)
Monocytes Absolute: 0 10*3/uL — ABNORMAL LOW (ref 0.1–1.0)
Monocytes Relative: 2 %
Neutro Abs: 1.3 10*3/uL — ABNORMAL LOW (ref 1.7–7.7)
Neutrophils Relative %: 85 %
Platelets: 174 10*3/uL (ref 150–400)
RBC: 1.32 MIL/uL — ABNORMAL LOW (ref 4.22–5.81)
RDW: 15.9 % — ABNORMAL HIGH (ref 11.5–15.5)
WBC: 1.5 10*3/uL — ABNORMAL LOW (ref 4.0–10.5)
nRBC: 0 % (ref 0.0–0.2)

## 2022-11-07 LAB — COMPREHENSIVE METABOLIC PANEL
ALT: 27 U/L (ref 0–44)
AST: 26 U/L (ref 15–41)
Albumin: 3.1 g/dL — ABNORMAL LOW (ref 3.5–5.0)
Alkaline Phosphatase: 129 U/L — ABNORMAL HIGH (ref 38–126)
Anion gap: 11 (ref 5–15)
BUN: 19 mg/dL (ref 8–23)
CO2: 23 mmol/L (ref 22–32)
Calcium: 8.5 mg/dL — ABNORMAL LOW (ref 8.9–10.3)
Chloride: 102 mmol/L (ref 98–111)
Creatinine, Ser: 1.41 mg/dL — ABNORMAL HIGH (ref 0.61–1.24)
GFR, Estimated: 50 mL/min — ABNORMAL LOW (ref >60)
Glucose, Bld: 180 mg/dL — ABNORMAL HIGH (ref 70–99)
Potassium: 4.6 mmol/L (ref 3.5–5.1)
Sodium: 136 mmol/L (ref 135–145)
Total Bilirubin: 1.7 mg/dL — ABNORMAL HIGH (ref 0.3–1.2)
Total Protein: 6.6 g/dL (ref 6.5–8.1)

## 2022-11-07 LAB — RESP PANEL BY RT-PCR (RSV, FLU A&B, COVID)  RVPGX2
Influenza A by PCR: NEGATIVE
Influenza B by PCR: NEGATIVE
Resp Syncytial Virus by PCR: NEGATIVE
SARS Coronavirus 2 by RT PCR: NEGATIVE

## 2022-11-07 LAB — POC OCCULT BLOOD, ED: Fecal Occult Bld: NEGATIVE

## 2022-11-07 LAB — PREPARE RBC (CROSSMATCH)

## 2022-11-07 LAB — CBG MONITORING, ED: Glucose-Capillary: 130 mg/dL — ABNORMAL HIGH (ref 70–99)

## 2022-11-07 LAB — BRAIN NATRIURETIC PEPTIDE: B Natriuretic Peptide: 325.9 pg/mL — ABNORMAL HIGH (ref 0.0–100.0)

## 2022-11-07 MED ORDER — SODIUM CHLORIDE 0.9% IV SOLUTION: Freq: Once | INTRAVENOUS | Status: AC

## 2022-11-07 MED ORDER — LORAZEPAM 2 MG/ML IJ SOLN
INTRAMUSCULAR | Status: AC
  Administered 2022-11-07: 0.5 mg via INTRAVENOUS
  Filled 2022-11-07: qty 1

## 2022-11-07 MED ORDER — LORAZEPAM 2 MG/ML IJ SOLN: 1.0000 mg | Freq: Once | INTRAMUSCULAR | Status: DC

## 2022-11-07 MED ORDER — ALBUTEROL SULFATE (2.5 MG/3ML) 0.083% IN NEBU
5.0000 mg | INHALATION_SOLUTION | Freq: Once | RESPIRATORY_TRACT | Status: AC
  Administered 2022-11-07: 5 mg via RESPIRATORY_TRACT
  Filled 2022-11-07: qty 6

## 2022-11-07 MED ORDER — LORAZEPAM 2 MG/ML IJ SOLN: 0.5000 mg | Freq: Once | INTRAMUSCULAR | Status: AC

## 2022-11-07 MED ORDER — LIDOCAINE HCL (PF) 1 % IJ SOLN
INTRAMUSCULAR | Status: AC
  Filled 2022-11-07: qty 10

## 2022-11-07 NOTE — ED Notes (Signed)
Date and time results received: 11/07/22 1926 (use smartphrase ".now" to insert current time)  Test:HGB  Critical Value: 5.4  Name of Provider Notified: Vanita Panda  Orders Received? Or Actions Taken?:

## 2022-11-07 NOTE — H&P (Incomplete)
History and Physical    [name] OHY:[id number] DOB: 1941-10-18 DOA: 11/07/2022  PCP: Lois Huxley, PA  Patient coming from: Home  Chief Complaint: Shortness of breath  HPI: James Sweeney is a 82 y.o. 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  His oxygen saturation was 70% on his home 3 L O2 and he was placed on nonrebreather by EMS.  He was in respiratory distress.  Noted to be in A-fib with RVR with rate up to 140s. Labs significant for WBC 1.5 and absolute lymphocyte count 0.2 (chronically low and stable), hemoglobin 5.4 (previously above 8), MCV 124.2, platelet count 174k (improved), creatinine 1.4 (baseline 1.2-1.4), T. bili 1.7, no significant elevation of alkaline phosphatase and transaminases normal, COVID/influenza/RSV PCR negative, BNP 325, INR 3.8.  Chest x-ray showing near complete opacification of the right hemithorax concerning for a very large pleural effusion and possible right lung consolidation. Patient underwent thoracentesis by IR and 1.8 L fluid removed.  Pleural fluid analysis labs pending.  After thoracentesis, his respiratory status improved and de-escalated to 3 L O2.  Heart rate improved.  Repeat chest x-ray showing marked interval decrease in opacification of the right hemithorax suggesting significant interval decrease in large right pleural effusion and no evidence of pneumothorax.  Did show homogenous opacity in the right mid and right lower lung fields suggesting underlying atelectasis/pneumonia and possibly loculated effusion.  Also showing increased interstitial markings in the left perihilar region which may suggest mild interstitial edema.  Increased density in  the left lower lung fields may be due to pleural effusion and underlying atelectasis/pneumonia.  Type and screen done and 2 units PRBCs ordered.  Patient also received albuterol neb treatment and Ativan in the ED.  [facility] called to admit.  Patient received Ativan in the ED and currently very somnolent.  He is able to wake up but falls asleep quickly and not able to answer any questions.  History provided by his grandson at bedside who states since after patient's hospital admission in March of last year, he has not followed up with oncology.  Grandson states patient has repeatedly told family that he does not wish to pursue any further workup or treatment for his cancer.  Grandson states patient has been very weak for the past 2 weeks and has no energy.  He is not eating or drinking much.  He also had a cough which has improved but his breathing has continued to worsen.  He has been on 2.5 to 3 L home oxygen since March.  For the past 2 or 3 days he has been increasingly more confused.  Per grandson, patient's breathing has improved significantly after thoracentesis.  Patient lives with his family and grandson has not noticed any hematemesis, hematochezia, melena, or hematuria.  Review of Systems:  Review of Systems  Reason unable to perform ROS: AMS.    Past Medical History:  Diagnosis Date  . Cervical spondylosis without myelopathy 12/01/2013  . Diabetes mellitus    borderline  . Enlarged prostate   . Frequency of urination   . Hearing deficit   . Heart murmur   . History of kidney stones    removed with Lithotripsy  . HOH (hard of hearing)    right ear  .  Hyperlipemia   . Hypertension   . Lumbago 12/01/2013  . Pneumonia 2016  . Polio    right leg smaller than left  . Shortness of breath dyspnea    with exertion    Past Surgical History:  Procedure Laterality Date  . CATARACT EXTRACTION Bilateral 2014  . LUNG BIOPSY Right 2018   March  . VIDEO ASSISTED THORACOSCOPY (VATS)/WEDGE  RESECTION Right 04/12/2018   Procedure: VIDEO ASSISTED THORACOSCOPY (VATS), WEDGE RESECTION RIGHT MIDDLE LOBE, PLACEMENT OF ON Q CATHETER;  Surgeon: [name], MD;  Location: [facility];  Service: Thoracic;  Laterality: Right;  [name] VIDEO BRONCHOSCOPY N/A 04/12/2018   Procedure: VIDEO BRONCHOSCOPY;  Surgeon: [name], MD;  Location: [facility];  Service: Thoracic;  Laterality: N/A;  . VIDEO BRONCHOSCOPY WITH ENDOBRONCHIAL NAVIGATION N/A 01/25/2016   Procedure: VIDEO BRONCHOSCOPY WITH ENDOBRONCHIAL NAVIGATION;  Surgeon: [name], MD;  Location: [facility];  Service: Thoracic;  Laterality: N/A;  . VIDEO BRONCHOSCOPY WITH ENDOBRONCHIAL ULTRASOUND N/A 01/25/2016   Procedure: VIDEO BRONCHOSCOPY WITH ENDOBRONCHIAL ULTRASOUND;  Surgeon: [name], MD;  Location: [place];  Service: Thoracic;  Laterality: N/A;     reports that he quit smoking about 54 years ago. His smoking use included cigarettes. He has a 4.50 pack-year smoking history. He quit smokeless tobacco use about 24 years ago.  His smokeless tobacco use included chew. He reports that he does not drink alcohol and does not use drugs.  Allergies  Allergen Reactions  . Hydrocodone-Acetaminophen Other (See Comments)    Suspected cause of confusion  . Losartan Potassium-Hctz Nausea Only  . Levaquin [Levofloxacin In D5w] Nausea Only  . Penicillins Hives and Other (See Comments)    Has patient had a PCN reaction causing immediate rash, facial/tongue/throat swelling, SOB or lightheadedness with hypotension: unknown, childhood allergy Has patient had a PCN reaction causing severe rash involving mucus membranes or skin necrosis: No Has patient had a PCN reaction that required hospitalization No Has patient had a PCN reaction occurring within the last 10 years: No If all of the above answers are "NO", then may proceed with Cephalosporin use.     Family History  Problem Relation Age of Onset  . Cancer Mother        Stomach  . Cancer  - Lung Father   . Heart attack Brother   . Diabetes Brother     Prior to Admission medications   Medication Sig Start Date End Date Taking? Authorizing Provider  rivaroxaban (XARELTO) 20 MG TABS tablet TAKE ONE TABLET BY MOUTH EVERYDAY AT BEDTIME Patient taking differently: Take 20 mg by mouth every evening. 08/04/22  Yes Croitoru, Mihai, MD  alfuzosin (UROXATRAL) 10 MG 24 hr tablet Take 10 mg by mouth at bedtime.     [provider]  Ascorbic Acid (SUPER C COMPLEX PO) Take 1 capsule by mouth every evening. Super C 1000    [provider]  atorvastatin (LIPITOR) 20 MG tablet TAKE 1 TABLET BY MOUTH EVERY DAY Patient taking differently: Take 20 mg by mouth daily. 09/02/21   Croitoru, Mihai, MD  Calcium Carb-Cholecalciferol (CALCIUM 600+D3 PO) Take 1 tablet by mouth every evening.    [provider]  Cyanocobalamin (VITAMIN B-12) 5000 MCG SUBL Place 5,000 mcg under the tongue every evening. Patient not taking: Reported on 07/10/2022    [provider]  diltiazem (CARDIZEM CD) 120 MG 24 hr capsule Take 1 capsule (120 mg total) by mouth daily. 01/11/22   Holli Humbles  C, MD  Ferrous Sulfate (IRON) 28 MG TABS Take 28 mg by mouth every evening. Patient not taking: Reported on 07/10/2022    [provider]  finasteride (PROSCAR) 5 MG tablet Take 5 mg by mouth at bedtime.  05/16/15   [provider]  fluticasone (FLONASE) 50 MCG/ACT nasal spray Place 2 sprays into both nostrils daily. 01/11/22 07/10/22  Little Ishikawa, MD  guaiFENesin-dextromethorphan (ROBITUSSIN DM) 100-10 MG/5ML syrup Take 15 mLs by mouth every 4 (four) hours as needed for cough. Patient not taking: Reported on 07/10/2022 01/10/22   Little Ishikawa, MD  hydrocortisone (ANUSOL-HC) 25 MG suppository Place 1 suppository (25 mg total) rectally 2 (two) times daily. Patient not taking: Reported on 07/10/2022 05/08/20   Brunetta Genera, MD  loratadine (CLARITIN) 10 MG tablet Take 1  tablet (10 mg total) by mouth daily. 01/11/22   Little Ishikawa, MD  Omega-3 Fatty Acids (FISH OIL) 1200 MG CAPS Take 1,200 mg by mouth every evening.    [provider]  ondansetron (ZOFRAN) 8 MG tablet Take 1 tablet (8 mg total) by mouth every 8 (eight) hours as needed for nausea. Patient not taking: Reported on 07/10/2022 04/27/20   Brunetta Genera, MD  Polyethyl Glycol-Propyl Glycol 0.4-0.3 % SOLN Place 1 drop into both eyes 2 (two) times daily as needed (for dry/irritated eyes.). Patient not taking: Reported on 07/10/2022    [provider]  traMADol (ULTRAM) 50 MG tablet Take 1 tablet (50 mg total) by mouth every 6 (six) hours as needed (pain). 07/10/22   Croitoru, Mihai, MD  triamcinolone ointment (KENALOG) 0.5 % APPLY TO ITCHY RASH ON EXTREMITIES AND WAIST TWICE A DAY FOR 15 DAYS. DO NOT APPLY TO FACE Patient not taking: Reported on 07/10/2022 06/06/21   Brunetta Genera, MD  valsartan (DIOVAN) 160 MG tablet Take 160 mg by mouth daily. 07/04/22   [provider]    Physical Exam: Vitals:   11/07/22 2112 11/07/22 2115 11/07/22 2130 11/07/22 2145  BP:  131/67 (!) 118/47 (!) 114/50  Pulse: 96 82 71 82  Resp: 10 (!) '26 17 15  '$ Temp:      TempSrc:      SpO2: 94% 94% 100% 100%  Weight:      Height:        Physical Exam Vitals reviewed.  Constitutional:      General: He is not in acute distress. HENT:     Head: Normocephalic and atraumatic.  Eyes:     Extraocular Movements: Extraocular movements intact.  Cardiovascular:     Rate and Rhythm: Normal rate. Rhythm irregular.     Pulses: Normal pulses.  Pulmonary:     Effort: Pulmonary effort is normal. No respiratory distress.     Breath sounds: Rales present. No wheezing or rhonchi.  Abdominal:     General: Bowel sounds are normal.     Palpations: Abdomen is soft.     Tenderness: There is no abdominal tenderness. There is no guarding.  Musculoskeletal:     Cervical back: Normal range of motion.      Right lower leg: No edema.     Left lower leg: No edema.  Skin:    General: Skin is warm and dry.  Neurological:     General: No focal deficit present.     Mental Status: He is alert.     Comments: Very somnolent but arousable     Labs on Admission: I have personally reviewed following labs  and imaging studies  CBC: Recent Labs  Lab 11/07/22 1803  WBC 1.5*  NEUTROABS 1.3*  HGB 5.4*  HCT 16.4*  MCV 124.2*  PLT 161   Basic Metabolic Panel: Recent Labs  Lab 11/07/22 1803  NA 136  K 4.6  CL 102  CO2 23  GLUCOSE 180*  BUN 19  CREATININE 1.41*  CALCIUM 8.5*   GFR: Estimated Creatinine Clearance: 42.4 mL/min (A) (by C-G formula based on SCr of 1.41 mg/dL (H)). Liver Function Tests: Recent Labs  Lab 11/07/22 1803  AST 26  ALT 27  ALKPHOS 129*  BILITOT 1.7*  PROT 6.6  ALBUMIN 3.1*   No results for input(s): "LIPASE", "AMYLASE" in the last 168 hours. No results for input(s): "AMMONIA" in the last 168 hours. Coagulation Profile: Recent Labs  Lab 11/07/22 1818  INR 3.8*   Cardiac Enzymes: No results for input(s): "CKTOTAL", "CKMB", "CKMBINDEX", "TROPONINI" in the last 168 hours. BNP (last 3 results) No results for input(s): "PROBNP" in the last 8760 hours. HbA1C: No results for input(s): "HGBA1C" in the last 72 hours. CBG: Recent Labs  Lab 11/07/22 2212  GLUCAP 130*   Lipid Profile: No results for input(s): "CHOL", "HDL", "LDLCALC", "TRIG", "CHOLHDL", "LDLDIRECT" in the last 72 hours. Thyroid Function Tests: No results for input(s): "TSH", "T4TOTAL", "FREET4", "T3FREE", "THYROIDAB" in the last 72 hours. Anemia Panel: No results for input(s): "VITAMINB12", "FOLATE", "FERRITIN", "TIBC", "IRON", "RETICCTPCT" in the last 72 hours. Urine analysis:    Component Value Date/Time   COLORURINE YELLOW 01/06/2022 0005   APPEARANCEUR HAZY (A) 01/06/2022 0005   LABSPEC 1.015 01/06/2022 0005   PHURINE 5.0 01/06/2022 0005   GLUCOSEU NEGATIVE 01/06/2022 0005    HGBUR NEGATIVE 01/06/2022 0005   BILIRUBINUR NEGATIVE 01/06/2022 0005   KETONESUR NEGATIVE 01/06/2022 0005   PROTEINUR 30 (A) 01/06/2022 0005   UROBILINOGEN 0.2 09/10/2010 1141   NITRITE NEGATIVE 01/06/2022 0005   LEUKOCYTESUR NEGATIVE 01/06/2022 0005    Radiological Exams on Admission: DG CHEST PORT 1 VIEW  Result Date: 11/07/2022 CLINICAL DATA:  Status post right thoracentesis EXAM: PORTABLE CHEST 1 VIEW COMPARISON:  Previous studies including the examination done earlier today FINDINGS: Transverse diameter of heart is slightly increased. Central pulmonary vessels are prominent. There is significant interval improvement in aeration in right hemithorax suggesting marked interval decrease in right pleural effusion after thoracentesis. There are increased markings in the left parahilar region. There is increased opacity in medial left lower lung field which may be due to pleural effusion and underlying infiltrate. There is blunting of both lateral CP angles. There is no pneumothorax. IMPRESSION: There is marked interval decrease in opacification of right hemithorax suggesting significant interval decrease in large right pleural effusion. There is no pneumothorax. Homogeneous opacity in right mid and right lower lung fields may suggest underlying atelectasis/pneumonia and possibly loculated effusion. Increased interstitial markings in the left parahilar region may suggest mild interstitial edema. Increased density in left lower lung fields may be due to pleural effusion and underlying atelectasis/pneumonia. Electronically Signed   By: Elmer Picker M.D.   On: 11/07/2022 21:17   DG Chest Port 1 View  Result Date: 11/07/2022 CLINICAL DATA:  Short of breath, history of lymphoma EXAM: PORTABLE CHEST 1 VIEW COMPARISON:  01/05/2022, 09/16/2021 FINDINGS: Single frontal view of the chest demonstrates near complete opacification of the right hemithorax, consistent with a combination of lung consolidation  and pleural effusion. Vascular redistribution throughout the left lung. Otherwise the left chest is clear. The cardiac silhouette is  partially obscured. No pneumothorax. No acute bony abnormality. IMPRESSION: 1. Near complete opacification of the right hemithorax, consistent with a combination of right lung consolidation and pleural effusion. In this patient with a history of prior lymphoma and abnormal chest CT findings, repeat CT chest with IV contrast may be useful if the patient would be a therapy candidate should neoplasm be detected. Electronically Signed   By: Randa Ngo M.D.   On: 11/07/2022 18:32    EKG: Independently reviewed.  A-fib with RVR, baseline wander.  Assessment and Plan  Acute on chronic hypoxemic respiratory failure Large right pleural effusion Community-acquired pneumonia Acute on chronic HFrEF -Oxygen saturation 70% on his home 3 L with EMS and initially required nonrebreather, now improved after thoracentesis and stable on 3 L.   -Initial chest x-ray showing near complete opacification of the right hemithorax concerning for a very large pleural effusion and possible right lung consolidation.  -Patient underwent thoracentesis by IR and 1.8 L fluid removed.  Pleural fluid analysis labs pending. -Repeat chest x-ray showing marked interval decrease in opacification of the right hemithorax suggesting significant interval decrease in large right pleural effusion and no evidence of pneumothorax.  Did show homogenous opacity in the right mid and right lower lung fields suggesting underlying atelectasis/pneumonia and possibly loculated effusion.  Also showing increased interstitial markings in the left perihilar region which may suggest mild interstitial edema.  Increased density in the left lower lung fields may be due to pleural effusion and underlying atelectasis/pneumonia. -Echo done in March 2023 showing EF 60 to 65% and indeterminate diastolic parameters.  BNP 325. IV Lasix 40  mg x 1 ordered and repeat echocardiogram.  Monitor intake and output, daily weights. -Penicillin allergy listed but has tolerated ceftriaxone in the past.  Start ceftriaxone and azithromycin for community-acquired pneumonia. MRSA PCR screen.  Check procalcitonin level.  Order blood cultures. -Has chronic lymphopenia.  No fever, tachycardia, or signs of sepsis at this time. -COVID/influenza/RSV PCR negative -Continuous pulse ox, supplemental oxygen to keep oxygen saturation above 94%  Permanent A-fib with RVR Likely precipitated by pulmonary issues.  Now improved after thoracentesis, heart rate currently in the 70s to 80s. -Cardiac monitoring -Continue Cardizem and Xarelto after pharmacy med rec is done.  Acute metabolic encephalopathy   Marginal zone lymphoma/MALT lymphoma During hospital admission back in March 2023, there were discussions with the patient and his family regarding further evaluation with bone marrow biopsy.  Case was also discussed with oncology at that time who felt that the patient was not going to tolerate any aggressive intervention given his age and medical comorbidities.  Per grandson, patient has repeatedly told family that he does not wish to pursue any further workup or treatment for his cancer.  Discussed CODE STATUS and grandson wishes for him to remain full code.  -Palliative care consulted  Chronic pancytopenia/worsening symptomatic anemia -In the setting of lymphoma. -WBC 1.5 and absolute lymphocyte count 0.2 (chronically low and stable), hemoglobin 5.4 (previously above 8), MCV 124.2, platelet count 174k (improved). -No signs or symptoms of bleeding.  Light brown stool on rectal exam and FOBT negative. -Type and screen, 2 units PRBCs ordered in the ED -Follow-up posttransfusion H&H, continue to transfuse PRBCs if hemoglobin less than 7  Hypertension Stable. -Continue home meds after pharmacy med rec is done.  Hyperlipidemia -Continue Lipitor after  pharmacy med rec is done.  CKD stage IIIa Stable.  Creatinine 1.4, baseline 1.2-1.4. Continue to monitor renal function.  Diet-controlled type 2 diabetes  A1c was 6.3 in June 2019 and 5.3 in March 2023. Glucose 180 on labs Sensitive sliding scale insulin and CBG checks every 4 hours for now until patient is more awake and alert.  BPH -Continue home meds after pharmacy med rec is done.  DVT prophylaxis: Continue Xarelto after pharmacy med rec is done. Code Status: {Blank single:19197::"Full Code","DNR","DNR/DNI","Comfort Care","***"} Family Communication: ***  Consults called: ***  Level of care: {Blank single:19197::"Med-Surg","Telemetry bed","Progressive Care Unit","Step Down Unit"} Admission status: ***  Shela Leff MD [facility]  If 7PM-7AM, please contact night-coverage [email or web address]  11/07/2022, 10:24 PM

## 2022-11-07 NOTE — H&P (Signed)
History and Physical    James Sweeney DHR:[id number] DOB: 1941/02/22 DOA: 11/07/2022  PCP: James Huxley, PA  Patient coming from: Home  Chief Complaint: Shortness of breath  HPI: James Sweeney is a 82 y.o. 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  His oxygen saturation was 70% on his home 3 L O2 and he was placed on nonrebreather by EMS.  He was in respiratory distress.  Noted to be in A-fib with RVR with rate up to 140s. Labs significant for WBC 1.5 and absolute lymphocyte count 0.2 (chronically low and stable), hemoglobin 5.4 (previously above 8), MCV 124.2, platelet count 174k (improved), creatinine 1.4 (baseline 1.2-1.4), T. bili 1.7, no significant elevation of alkaline phosphatase and transaminases normal, COVID/influenza/RSV PCR negative, BNP 325, INR 3.8.  Chest x-ray showing near complete opacification of the right hemithorax concerning for a very large pleural effusion and possible right lung consolidation. Patient underwent thoracentesis by IR and 1.8 L fluid removed.  Pleural fluid analysis labs pending.  After thoracentesis, his respiratory status improved and de-escalated to 3 L O2.  Heart rate improved.  Repeat chest x-ray showing marked interval decrease in opacification of the right hemithorax suggesting significant interval decrease in large right pleural effusion and no evidence of pneumothorax.  Did show homogenous opacity in the right mid and right lower lung fields suggesting underlying atelectasis/pneumonia and possibly loculated effusion.  Also showing increased interstitial markings in the left perihilar region which may suggest mild interstitial edema.  Increased density in  the left lower lung fields may be due to pleural effusion and underlying atelectasis/pneumonia.  Type and screen done and 2 units PRBCs ordered.  Patient also received albuterol neb treatment and Ativan in the ED.  [facility] called to admit.  Patient received Ativan in the ED and currently very somnolent.  He is able to wake up but falls asleep quickly and not able to answer any questions.  History provided by his grandson at bedside who states since after patient's hospital admission in March of last year, he has not followed up with oncology.  Grandson states patient has repeatedly told family that he does not wish to pursue any further workup or treatment for his cancer.  Grandson states patient has been very weak for the past 2 weeks and has no energy.  He is not eating or drinking much.  He also had a cough which has improved but his breathing has continued to worsen.  He has been on 2.5 to 3 L home oxygen since March.  For the past 2 or 3 days he has been increasingly more confused.  Per grandson, patient's breathing has improved significantly after thoracentesis.  Patient lives with his family and grandson has not noticed any hematemesis, hematochezia, melena, or hematuria.  Review of Systems:  Review of Systems  Reason unable to perform ROS: AMS.    Past Medical History:  Diagnosis Date   Cervical spondylosis without myelopathy 12/01/2013   Diabetes mellitus    borderline   Enlarged prostate    Frequency of urination    Hearing deficit    Heart murmur    History of kidney stones    removed with Lithotripsy   HOH (hard of hearing)    right ear  Hyperlipemia    Hypertension    Lumbago 12/01/2013   Pneumonia 2016   Polio    right leg smaller than left   Shortness of breath dyspnea    with exertion    Past Surgical History:  Procedure Laterality Date   CATARACT EXTRACTION Bilateral 2014   LUNG BIOPSY Right 2018   March   VIDEO ASSISTED THORACOSCOPY (VATS)/WEDGE RESECTION Right  04/12/2018   Procedure: VIDEO ASSISTED THORACOSCOPY (VATS), WEDGE RESECTION RIGHT MIDDLE LOBE, PLACEMENT OF ON Q CATHETER;  Surgeon: [name], MD;  Location: [facility];  Service: Thoracic;  Laterality: Right;   VIDEO BRONCHOSCOPY N/A 04/12/2018   Procedure: VIDEO BRONCHOSCOPY;  Surgeon: [name], MD;  Location: [facility];  Service: Thoracic;  Laterality: N/A;   VIDEO BRONCHOSCOPY WITH ENDOBRONCHIAL NAVIGATION N/A 01/25/2016   Procedure: VIDEO BRONCHOSCOPY WITH ENDOBRONCHIAL NAVIGATION;  Surgeon: [name], MD;  Location: [facility];  Service: Thoracic;  Laterality: N/A;   VIDEO BRONCHOSCOPY WITH ENDOBRONCHIAL ULTRASOUND N/A 01/25/2016   Procedure: VIDEO BRONCHOSCOPY WITH ENDOBRONCHIAL ULTRASOUND;  Surgeon: [name], MD;  Location: [facility];  Service: Thoracic;  Laterality: N/A;     reports that he quit smoking about 54 years ago. His smoking use included cigarettes. He has a 4.50 pack-year smoking history. He quit smokeless tobacco use about 24 years ago.  His smokeless tobacco use included chew. He reports that he does not drink alcohol and does not use drugs.  Allergies  Allergen Reactions   Hydrocodone-Acetaminophen Other (See Comments)    Suspected cause of confusion   Losartan Potassium-Hctz Nausea Only   Levaquin [Levofloxacin In D5w] Nausea Only   Penicillins Hives and Other (See Comments)    Has patient had a PCN reaction causing immediate rash, facial/tongue/throat swelling, SOB or lightheadedness with hypotension: unknown, childhood allergy Has patient had a PCN reaction causing severe rash involving mucus membranes or skin necrosis: No Has patient had a PCN reaction that required hospitalization No Has patient had a PCN reaction occurring within the last 10 years: No If all of the above answers are "NO", then may proceed with Cephalosporin use.     Family History  Problem Relation Age of Onset   Cancer Mother        Stomach   Cancer - Lung Father    Heart  attack Brother    Diabetes Brother     Prior to Admission medications   Medication Sig Start Date End Date Taking? Authorizing Provider  rivaroxaban (XARELTO) 20 MG TABS tablet TAKE ONE TABLET BY MOUTH EVERYDAY AT BEDTIME Patient taking differently: Take 20 mg by mouth every evening. 08/04/22  Yes Croitoru, Mihai, MD  alfuzosin (UROXATRAL) 10 MG 24 hr tablet Take 10 mg by mouth at bedtime.     [provider]  Ascorbic Acid (SUPER C COMPLEX PO) Take 1 capsule by mouth every evening. Super C 1000    [provider]  atorvastatin (LIPITOR) 20 MG tablet TAKE 1 TABLET BY MOUTH EVERY DAY Patient taking differently: Take 20 mg by mouth daily. 09/02/21   Croitoru, Mihai, MD  Calcium Carb-Cholecalciferol (CALCIUM 600+D3 PO) Take 1 tablet by mouth every evening.    [provider]  Cyanocobalamin (VITAMIN B-12) 5000 MCG SUBL Place 5,000 mcg under the tongue every evening. Patient not taking: Reported on 07/10/2022    [provider]  diltiazem (CARDIZEM CD) 120 MG 24 hr capsule Take 1 capsule (120 mg total) by mouth daily. 01/11/22   Holli Humbles  C, MD  Ferrous Sulfate (IRON) 28 MG TABS Take 28 mg by mouth every evening. Patient not taking: Reported on 07/10/2022    [provider]  finasteride (PROSCAR) 5 MG tablet Take 5 mg by mouth at bedtime.  05/16/15   [provider]  fluticasone (FLONASE) 50 MCG/ACT nasal spray Place 2 sprays into both nostrils daily. 01/11/22 07/10/22  Little Ishikawa, MD  guaiFENesin-dextromethorphan (ROBITUSSIN DM) 100-10 MG/5ML syrup Take 15 mLs by mouth every 4 (four) hours as needed for cough. Patient not taking: Reported on 07/10/2022 01/10/22   Little Ishikawa, MD  hydrocortisone (ANUSOL-HC) 25 MG suppository Place 1 suppository (25 mg total) rectally 2 (two) times daily. Patient not taking: Reported on 07/10/2022 05/08/20   Brunetta Genera, MD  loratadine (CLARITIN) 10 MG tablet Take 1 tablet (10 mg total) by  mouth daily. 01/11/22   Little Ishikawa, MD  Omega-3 Fatty Acids (FISH OIL) 1200 MG CAPS Take 1,200 mg by mouth every evening.    [provider]  ondansetron (ZOFRAN) 8 MG tablet Take 1 tablet (8 mg total) by mouth every 8 (eight) hours as needed for nausea. Patient not taking: Reported on 07/10/2022 04/27/20   Brunetta Genera, MD  Polyethyl Glycol-Propyl Glycol 0.4-0.3 % SOLN Place 1 drop into both eyes 2 (two) times daily as needed (for dry/irritated eyes.). Patient not taking: Reported on 07/10/2022    [provider]  traMADol (ULTRAM) 50 MG tablet Take 1 tablet (50 mg total) by mouth every 6 (six) hours as needed (pain). 07/10/22   Croitoru, Mihai, MD  triamcinolone ointment (KENALOG) 0.5 % APPLY TO ITCHY RASH ON EXTREMITIES AND WAIST TWICE A DAY FOR 15 DAYS. DO NOT APPLY TO FACE Patient not taking: Reported on 07/10/2022 06/06/21   Brunetta Genera, MD  valsartan (DIOVAN) 160 MG tablet Take 160 mg by mouth daily. 07/04/22   [provider]    Physical Exam: Vitals:   11/07/22 2112 11/07/22 2115 11/07/22 2130 11/07/22 2145  BP:  131/67 (!) 118/47 (!) 114/50  Pulse: 96 82 71 82  Resp: 10 (!) '26 17 15  '$ Temp:      TempSrc:      SpO2: 94% 94% 100% 100%  Weight:      Height:        Physical Exam Vitals reviewed.  Constitutional:      General: He is not in acute distress. HENT:     Head: Normocephalic and atraumatic.  Eyes:     Extraocular Movements: Extraocular movements intact.  Cardiovascular:     Rate and Rhythm: Normal rate. Rhythm irregular.     Pulses: Normal pulses.  Pulmonary:     Effort: Pulmonary effort is normal. No respiratory distress.     Breath sounds: Rales present. No wheezing or rhonchi.  Abdominal:     General: Bowel sounds are normal.     Palpations: Abdomen is soft.     Tenderness: There is no abdominal tenderness. There is no guarding.  Musculoskeletal:     Cervical back: Normal range of motion.     Right lower leg: No  edema.     Left lower leg: No edema.  Skin:    General: Skin is warm and dry.  Neurological:     General: No focal deficit present.     Mental Status: He is alert.     Comments: Very somnolent but arousable     Labs on Admission: I have personally reviewed following labs  and imaging studies  CBC: Recent Labs  Lab 11/07/22 1803  WBC 1.5*  NEUTROABS 1.3*  HGB 5.4*  HCT 16.4*  MCV 124.2*  PLT 532   Basic Metabolic Panel: Recent Labs  Lab 11/07/22 1803  NA 136  K 4.6  CL 102  CO2 23  GLUCOSE 180*  BUN 19  CREATININE 1.41*  CALCIUM 8.5*   GFR: Estimated Creatinine Clearance: 42.4 mL/min (A) (by C-G formula based on SCr of 1.41 mg/dL (H)). Liver Function Tests: Recent Labs  Lab 11/07/22 1803  AST 26  ALT 27  ALKPHOS 129*  BILITOT 1.7*  PROT 6.6  ALBUMIN 3.1*   No results for input(s): "LIPASE", "AMYLASE" in the last 168 hours. No results for input(s): "AMMONIA" in the last 168 hours. Coagulation Profile: Recent Labs  Lab 11/07/22 1818  INR 3.8*   Cardiac Enzymes: No results for input(s): "CKTOTAL", "CKMB", "CKMBINDEX", "TROPONINI" in the last 168 hours. BNP (last 3 results) No results for input(s): "PROBNP" in the last 8760 hours. HbA1C: No results for input(s): "HGBA1C" in the last 72 hours. CBG: Recent Labs  Lab 11/07/22 2212  GLUCAP 130*   Lipid Profile: No results for input(s): "CHOL", "HDL", "LDLCALC", "TRIG", "CHOLHDL", "LDLDIRECT" in the last 72 hours. Thyroid Function Tests: No results for input(s): "TSH", "T4TOTAL", "FREET4", "T3FREE", "THYROIDAB" in the last 72 hours. Anemia Panel: No results for input(s): "VITAMINB12", "FOLATE", "FERRITIN", "TIBC", "IRON", "RETICCTPCT" in the last 72 hours. Urine analysis:    Component Value Date/Time   COLORURINE YELLOW 01/06/2022 0005   APPEARANCEUR HAZY (A) 01/06/2022 0005   LABSPEC 1.015 01/06/2022 0005   PHURINE 5.0 01/06/2022 0005   GLUCOSEU NEGATIVE 01/06/2022 0005   HGBUR NEGATIVE  01/06/2022 0005   BILIRUBINUR NEGATIVE 01/06/2022 0005   KETONESUR NEGATIVE 01/06/2022 0005   PROTEINUR 30 (A) 01/06/2022 0005   UROBILINOGEN 0.2 09/10/2010 1141   NITRITE NEGATIVE 01/06/2022 0005   LEUKOCYTESUR NEGATIVE 01/06/2022 0005    Radiological Exams on Admission: DG CHEST PORT 1 VIEW  Result Date: 11/07/2022 CLINICAL DATA:  Status post right thoracentesis EXAM: PORTABLE CHEST 1 VIEW COMPARISON:  Previous studies including the examination done earlier today FINDINGS: Transverse diameter of heart is slightly increased. Central pulmonary vessels are prominent. There is significant interval improvement in aeration in right hemithorax suggesting marked interval decrease in right pleural effusion after thoracentesis. There are increased markings in the left parahilar region. There is increased opacity in medial left lower lung field which may be due to pleural effusion and underlying infiltrate. There is blunting of both lateral CP angles. There is no pneumothorax. IMPRESSION: There is marked interval decrease in opacification of right hemithorax suggesting significant interval decrease in large right pleural effusion. There is no pneumothorax. Homogeneous opacity in right mid and right lower lung fields may suggest underlying atelectasis/pneumonia and possibly loculated effusion. Increased interstitial markings in the left parahilar region may suggest mild interstitial edema. Increased density in left lower lung fields may be due to pleural effusion and underlying atelectasis/pneumonia. Electronically Signed   By: Elmer Picker M.D.   On: 11/07/2022 21:17   DG Chest Port 1 View  Result Date: 11/07/2022 CLINICAL DATA:  Short of breath, history of lymphoma EXAM: PORTABLE CHEST 1 VIEW COMPARISON:  01/05/2022, 09/16/2021 FINDINGS: Single frontal view of the chest demonstrates near complete opacification of the right hemithorax, consistent with a combination of lung consolidation and pleural  effusion. Vascular redistribution throughout the left lung. Otherwise the left chest is clear. The cardiac silhouette is  partially obscured. No pneumothorax. No acute bony abnormality. IMPRESSION: 1. Near complete opacification of the right hemithorax, consistent with a combination of right lung consolidation and pleural effusion. In this patient with a history of prior lymphoma and abnormal chest CT findings, repeat CT chest with IV contrast may be useful if the patient would be a therapy candidate should neoplasm be detected. Electronically Signed   By: Randa Ngo M.D.   On: 11/07/2022 18:32    EKG: Independently reviewed.  A-fib with RVR, baseline wander.  Assessment and Plan  Acute on chronic hypoxemic respiratory failure Large right pleural effusion Community-acquired pneumonia Acute on chronic HFpEF -Oxygen saturation 70% on his home 3 L with EMS and initially required nonrebreather, now improved after thoracentesis and stable on 3 L.   -Initial chest x-ray showing near complete opacification of the right hemithorax concerning for a very large pleural effusion and possible right lung consolidation.  -Patient underwent thoracentesis by IR and 1.8 L fluid removed.  Pleural fluid analysis labs pending. -Repeat chest x-ray showing marked interval decrease in opacification of the right hemithorax suggesting significant interval decrease in large right pleural effusion and no evidence of pneumothorax.  Did show homogenous opacity in the right mid and right lower lung fields suggesting underlying atelectasis/pneumonia and possibly loculated effusion.  Also showing increased interstitial markings in the left perihilar region which may suggest mild interstitial edema.  Increased density in the left lower lung fields may be due to pleural effusion and underlying atelectasis/pneumonia. -Echo done in March 2023 showing EF 60 to 65% and indeterminate diastolic parameters.  BNP 325. IV Lasix 40 mg x 1  ordered and repeat echocardiogram.  Monitor intake and output, daily weights. -Penicillin allergy listed but has tolerated ceftriaxone in the past (discussed with pharmacy).  Start ceftriaxone and azithromycin for community-acquired pneumonia.  -Has chronic lymphopenia.  No fever, tachycardia, or signs of sepsis at this time. -COVID/influenza/RSV PCR negative -MRSA PCR screen -Check procalcitonin level -Blood cultures -Continuous pulse ox, supplemental oxygen to keep oxygen saturation above 94%  Permanent A-fib with RVR Likely precipitated by large pleural effusion and pneumonia.  Now improved after thoracentesis, heart rate currently in the 70s to 80s. -Cardiac monitoring -Continue Cardizem after pharmacy med rec is done. -Hold Xarelto until CT head is done given encephalopathy.  Acute metabolic encephalopathy Likely secondary to hypoxia and infection.  Family reporting increasing confusion for the past 2 to 3 days.  Difficult to evaluate the patient as he received Ativan in the ED and currently very somnolent.  Satting well on 3 L O2. -Stat CT head -Stat ABG -Check ammonia level  Marginal zone lymphoma/MALT lymphoma -During hospital admission back in March 2023, there were discussions with the patient and his family regarding further evaluation with bone marrow biopsy.  Case was also discussed with oncologist at that time who felt that the patient was not going to tolerate any aggressive intervention given his age and medical comorbidities.   -Per grandson, patient has repeatedly told family that he does not wish to pursue any further workup or treatment for his cancer.  Discussed CODE STATUS and grandson wishes for him to remain full code.  -Palliative care consulted  Chronic pancytopenia/worsening symptomatic anemia -In the setting of lymphoma. -WBC 1.5 and absolute lymphocyte count 0.2 (chronically low and stable), hemoglobin 5.4 (previously above 8), MCV 124.2, platelet count 174k  (improved). -No signs or symptoms of bleeding.  Light brown stool on rectal exam and FOBT negative. -Type and screen,  2 units PRBCs ordered in the ED -Follow-up posttransfusion H&H, continue to transfuse PRBCs if hemoglobin less than 7  Hypertension Stable. -Pharmacy med rec pending.  Hyperlipidemia -Pharmacy med rec pending.  CKD stage IIIa -Stable.  Creatinine 1.4, baseline 1.2-1.4. Continue to monitor renal function.  Diet-controlled type 2 diabetes -A1c was 6.3 in June 2019 and 5.3 in March 2023. -Glucose 180 on labs -Sensitive sliding scale insulin ACHS  BPH -Continue home meds after pharmacy med rec is done.  DVT prophylaxis: Hold Xarelto until CT head is done. Code Status: Full Code (discussed with the patient's grandson) Family Communication: Grandson [name] at bedside. Level of care: Progressive Care Unit Admission status: It is my clinical opinion that admission to INPATIENT is reasonable and necessary because of the expectation that this patient will require hospital care that crosses at least 2 midnights to treat this condition based on the medical complexity of the problems presented.  Given the aforementioned information, the predictability of an adverse outcome is felt to be significant.   Shela Leff MD [facility]  If 7PM-7AM, please contact night-coverage [email or web address]  11/07/2022, 10:24 PM

## 2022-11-07 NOTE — Progress Notes (Signed)
Attempted to place pt on BiPAP at this time, pt didn't tolerate it at all. RT placed pt back on NRB at this time due to him tolerating it better.

## 2022-11-07 NOTE — ED Provider Notes (Signed)
[facility]ERG[facility] DEPARTMENT Provider Note   CSN: [id number] Arrival date & time: 11/07/22  1800     History {Add pertinent medical, surgical, social history, OB history to HPI:1} Chief Complaint  Patient presents with   Shortness of Breath   Respiratory Distress    James Sweeney is a 82 y.o. male.  past medical history of type 2 diabetes, hypertension, dyslipidemia, permanent atrial fibrillation, MALT lymphoma stage IV BPH, recurrent pneumonia.  Brought to the emergency department today for shortness of breath and respiratory distress.  Has had a progressive decline over the past 2 and half weeks, becoming progressively more short of breath, with decreased exercise tolerance.  Over the past couple days has been extremely weak and unable to get out of bed or his chair because any activity makes him short of breath.  Oxygen saturations have maintained right around 90 to 95% on his home 3 L of nasal cannula oxygen which was increased from 2-1/2 L during this progression.  Yesterday was 85% on room air.  They called to try to get a home visit because he was too weak and short of breath to leave the house, but were unsuccessful and were told to come to the emergency department.  He had a cough a couple weeks ago, but has not been persistently coughing over the past couple of days.  No fevers, chills, night sweats.  Denies any chest pain.  No GI losses.   Shortness of Breath      Home Medications Prior to Admission medications   Medication Sig Start Date End Date Taking? Authorizing Provider  alfuzosin (UROXATRAL) 10 MG 24 hr tablet Take 10 mg by mouth at bedtime.     [provider]  Ascorbic Acid (SUPER C COMPLEX PO) Take 1 capsule by mouth every evening. Super C 1000 Patient not taking: Reported on 07/10/2022    [provider]  atorvastatin (LIPITOR) 20 MG tablet TAKE 1 TABLET BY MOUTH EVERY DAY Patient taking differently: Take 20 mg by mouth  daily. 09/02/21   Croitoru, Mihai, MD  Calcium Carb-Cholecalciferol (CALCIUM 600+D3 PO) Take 1 tablet by mouth every evening.    [provider]  Cyanocobalamin (VITAMIN B-12) 5000 MCG SUBL Place 5,000 mcg under the tongue every evening. Patient not taking: Reported on 07/10/2022    [provider]  diltiazem (CARDIZEM CD) 120 MG 24 hr capsule Take 1 capsule (120 mg total) by mouth daily. 01/11/22   Little Ishikawa, MD  Ferrous Sulfate (IRON) 28 MG TABS Take 28 mg by mouth every evening. Patient not taking: Reported on 07/10/2022    [provider]  finasteride (PROSCAR) 5 MG tablet Take 5 mg by mouth at bedtime.  05/16/15   [provider]  fluticasone (FLONASE) 50 MCG/ACT nasal spray Place 2 sprays into both nostrils daily. 01/11/22 07/10/22  Little Ishikawa, MD  guaiFENesin-dextromethorphan (ROBITUSSIN DM) 100-10 MG/5ML syrup Take 15 mLs by mouth every 4 (four) hours as needed for cough. Patient not taking: Reported on 07/10/2022 01/10/22   Little Ishikawa, MD  hydrocortisone (ANUSOL-HC) 25 MG suppository Place 1 suppository (25 mg total) rectally 2 (two) times daily. Patient not taking: Reported on 07/10/2022 05/08/20   Brunetta Genera, MD  loratadine (CLARITIN) 10 MG tablet Take 1 tablet (10 mg total) by mouth daily. 01/11/22   Little Ishikawa, MD  Omega-3 Fatty Acids (FISH OIL) 1200 MG CAPS Take 1,200 mg by mouth every evening.  [provider]  ondansetron (ZOFRAN) 8 MG tablet Take 1 tablet (8 mg total) by mouth every 8 (eight) hours as needed for nausea. Patient not taking: Reported on 07/10/2022 04/27/20   Brunetta Genera, MD  Polyethyl Glycol-Propyl Glycol 0.4-0.3 % SOLN Place 1 drop into both eyes 2 (two) times daily as needed (for dry/irritated eyes.). Patient not taking: Reported on 07/10/2022    [provider]  rivaroxaban (XARELTO) 20 MG TABS tablet TAKE ONE TABLET BY MOUTH EVERYDAY AT BEDTIME 08/04/22   Croitoru,  Mihai, MD  traMADol (ULTRAM) 50 MG tablet Take 1 tablet (50 mg total) by mouth every 6 (six) hours as needed (pain). 07/10/22   Croitoru, Mihai, MD  triamcinolone ointment (KENALOG) 0.5 % APPLY TO ITCHY RASH ON EXTREMITIES AND WAIST TWICE A DAY FOR 15 DAYS. DO NOT APPLY TO FACE Patient not taking: Reported on 07/10/2022 06/06/21   Brunetta Genera, MD  valsartan (DIOVAN) 160 MG tablet Take 160 mg by mouth daily. 07/04/22   [provider]      Allergies    Hydrocodone-acetaminophen, Losartan potassium-hctz, Levaquin [levofloxacin in d5w], and Penicillins    Review of Systems   Review of Systems  Respiratory:  Positive for shortness of breath.     Physical Exam Updated Vital Signs BP (!) 159/90   Pulse (!) 124   Temp (!) 95 F (35 C) (Oral)   Resp (!) 28   Ht '5\' 10"'$  (1.778 m)   Wt 84.5 kg   SpO2 100%   BMI 26.73 kg/m  Physical Exam Vitals and nursing note reviewed.  Constitutional:      General: He is in acute distress.     Appearance: He is well-developed. He is not diaphoretic.  HENT:     Head: Normocephalic and atraumatic.  Eyes:     Conjunctiva/sclera: Conjunctivae normal.  Cardiovascular:     Rate and Rhythm: Tachycardia present. Rhythm irregular.     Heart sounds: No murmur heard. Pulmonary:     Effort: Tachypnea, accessory muscle usage and respiratory distress present.     Breath sounds: Examination of the right-upper field reveals decreased breath sounds. Examination of the right-middle field reveals decreased breath sounds. Examination of the right-lower field reveals decreased breath sounds. Decreased breath sounds present.  Abdominal:     Palpations: Abdomen is soft.     Tenderness: There is no abdominal tenderness.  Musculoskeletal:        General: No swelling.     Cervical back: Neck supple.     Right lower leg: No tenderness. No edema.     Left lower leg: No tenderness. No edema.  Skin:    General: Skin is warm and dry.     Capillary Refill:  Capillary refill takes less than 2 seconds.  Neurological:     General: No focal deficit present.     Mental Status: He is alert.  Psychiatric:        Mood and Affect: Mood normal.     ED Results / Procedures / Treatments   Labs (all labs ordered are listed, but only abnormal results are displayed) Labs Reviewed  RESP PANEL BY RT-PCR (RSV, FLU A&B, COVID)  [id number]  COMPREHENSIVE METABOLIC PANEL  CBC WITH DIFFERENTIAL/PLATELET  BRAIN NATRIURETIC PEPTIDE  PROTIME-INR    EKG None  Radiology No results found.  Procedures Procedures  {Document cardiac monitor, telemetry assessment procedure when appropriate:1}  Medications Ordered in ED Medications  albuterol (PROVENTIL) (2.5 MG/3ML) 0.083% nebulizer solution 5  mg (5 mg Nebulization Given 11/07/22 1811)    ED Course/ Medical Decision Making/ A&P                           Medical Decision Making Amount and/or Complexity of Data Reviewed Labs: ordered. Radiology: ordered.  Risk Prescription drug management.   ROMYN BOSWELL is a 82 y.o. male with PMH of type 2 diabetes, hypertension, dyslipidemia, permanent atrial fibrillation, MALT lymphoma stage IV BPH, recurrent pneumonia, who presents to the ED with shortness of breath. Exam notable for decreased breath sounds on the right side. Afebrile, tachycardic, appears to be in A-fib with RVR ranging from 100-140.  More often around 100-110.  Blood pressure is stable, mental status is normal.  Patient very dyspneic, in respiratory distress.  Initially oxygen saturation in the 70s with EMS on 3 L nasal cannula oxygen.  With improved into the 90s on nonrebreather, up to 100% on arrival.  Patient appears to be tolerating nonrebreather very well.  On initial assessment, decreased breath sounds on the right side, given his history I am concerned for malignant right pleural effusion.  I believe his A-fib RVR is driven by his hypoxia and tachypnea.  Bedside 20 care ultrasound obtained of the  lungs and heart while waiting for chest x-ray which showed large right-sided pleural effusion, normal cardiac ejection fraction, no signs of pericardial effusion.  Initial differential diagnosis includes: Pleural effusion, pneumonia, COPD exacerbation, asthma exacerbation, CHF exacerbation, ACS, pulmonary embolism, pneumothorax.  Chest x-ray shows a large right-sided pleural effusion, difficult to identify any underlying pneumonia, although still certainly on the differential because he has a history of prior recurrent pneumonia.  Additionally, because of his cancer history PE is on the differential especially given his tachypnea, hypoxia, and tachycardia.  However he is anticoagulated, has not missed any doses of his Xarelto.  Doubt Aortic Dissection, Pancreatitis, Pneumothorax, Arrhythmia, Endo/Myo/Pericarditis, Esophageal pathology, or other Emergent pathology.  Given Ativan for severe anxiety which developed after attempted placement on BiPAP.  He is stable on the nonrebreather, will remain on nonrebreather 15 L/min at 100% while we await IR ultrasound-guided thoracentesis.  Will plan to reevaluate after thoracentesis, repeat chest x-ray, identify if any coverage for pneumonia is indicated or needed based on his labs and repeat chest x-ray, and also consider if further workup for pulmonary embolism is indicated.    Disposition: ***  The plan for this patient was discussed with Dr. Vanita Panda, who voiced agreement and who oversaw evaluation and treatment of this patient.    {Document critical care time when appropriate:1} {Document review of labs and clinical decision tools ie heart score, Chads2Vasc2 etc:1}  {Document your independent review of radiology images, and any outside records:1} {Document your discussion with family members, caretakers, and with consultants:1} {Document social determinants of health affecting pt's care:1} {Document your decision making why or why not admission,  treatments were needed:1} Final Clinical Impression(s) / ED Diagnoses Final diagnoses:  None    Rx / DC Orders ED Discharge Orders     None

## 2022-11-07 NOTE — Procedures (Signed)
Interventional Radiology Procedure Note  Timeout: 8:30pm by Dr. Earleen Newport  Procedure: Image guided thoracentesis, right. ~1.8L .  Complications: None  EBL: None  Recommendations: - CXR now - routine wound care - Stable return to ED  Signed,  Dulcy Fanny. Earleen Newport, DO

## 2022-11-07 NOTE — ED Notes (Signed)
Pt transported to [name] for Thoracentesis

## 2022-11-07 NOTE — ED Triage Notes (Signed)
Has stage 4 lung cancer and today after a scan got bad test results related to Ca per the son, and was told to come to ER did not come immediately however began getting worsening SOB and called ems arrived on NRB with minimal air movement worsen on the right alert and oriented lives with spouse cont. To say "I cannot breathe"

## 2022-11-08 ENCOUNTER — Inpatient Hospital Stay (HOSPITAL_COMMUNITY): Payer: Medicare PPO

## 2022-11-08 DIAGNOSIS — Z7189 Other specified counseling: Secondary | ICD-10-CM

## 2022-11-08 DIAGNOSIS — J90 Pleural effusion, not elsewhere classified: Secondary | ICD-10-CM | POA: Diagnosis not present

## 2022-11-08 DIAGNOSIS — I5033 Acute on chronic diastolic (congestive) heart failure: Secondary | ICD-10-CM

## 2022-11-08 DIAGNOSIS — G9341 Metabolic encephalopathy: Secondary | ICD-10-CM | POA: Diagnosis not present

## 2022-11-08 DIAGNOSIS — C8580 Other specified types of non-Hodgkin lymphoma, unspecified site: Secondary | ICD-10-CM

## 2022-11-08 DIAGNOSIS — J9601 Acute respiratory failure with hypoxia: Secondary | ICD-10-CM | POA: Diagnosis not present

## 2022-11-08 DIAGNOSIS — I4891 Unspecified atrial fibrillation: Secondary | ICD-10-CM

## 2022-11-08 LAB — BASIC METABOLIC PANEL
Anion gap: 8 (ref 5–15)
BUN: 21 mg/dL (ref 8–23)
CO2: 27 mmol/L (ref 22–32)
Calcium: 7.8 mg/dL — ABNORMAL LOW (ref 8.9–10.3)
Chloride: 101 mmol/L (ref 98–111)
Creatinine, Ser: 1.37 mg/dL — ABNORMAL HIGH (ref 0.61–1.24)
GFR, Estimated: 52 mL/min — ABNORMAL LOW (ref >60)
Glucose, Bld: 121 mg/dL — ABNORMAL HIGH (ref 70–99)
Potassium: 4 mmol/L (ref 3.5–5.1)
Sodium: 136 mmol/L (ref 135–145)

## 2022-11-08 LAB — RETICULOCYTES
Immature Retic Fract: 19.6 % — ABNORMAL HIGH (ref 2.3–15.9)
RBC.: 2.2 MIL/uL — ABNORMAL LOW (ref 4.22–5.81)
Retic Count, Absolute: 36.7 10*3/uL (ref 19.0–186.0)
Retic Ct Pct: 1.7 % (ref 0.4–3.1)

## 2022-11-08 LAB — IRON AND TIBC
Iron: 92 ug/dL (ref 45–182)
Saturation Ratios: 70 % — ABNORMAL HIGH (ref 17.9–39.5)
TIBC: 132 ug/dL — ABNORMAL LOW (ref 250–450)
UIBC: 40 ug/dL

## 2022-11-08 LAB — PREPARE RBC (CROSSMATCH)

## 2022-11-08 LAB — ECHOCARDIOGRAM COMPLETE
AR max vel: 2.98 cm2
AV Area VTI: 2.86 cm2
AV Area mean vel: 2.85 cm2
AV Mean grad: 3 mmHg
AV Peak grad: 5.4 mmHg
Ao pk vel: 1.16 m/s
Area-P 1/2: 4.49 cm2
Calc EF: 57.8 %
Height: 70 in
MV M vel: 3.5 m/s
MV Peak grad: 49 mmHg
S' Lateral: 3.5 cm
Single Plane A2C EF: 47.1 %
Single Plane A4C EF: 69.8 %
Weight: 2980.62 oz

## 2022-11-08 LAB — BODY FLUID CELL COUNT WITH DIFFERENTIAL
Eos, Fluid: 0 %
Lymphs, Fluid: 91 %
Monocyte-Macrophage-Serous Fluid: 8 % — ABNORMAL LOW (ref 50–90)
Neutrophil Count, Fluid: 1 % (ref 0–25)
Total Nucleated Cell Count, Fluid: 411 cu mm (ref 0–1000)

## 2022-11-08 LAB — CBC
HCT: 17.5 % — ABNORMAL LOW (ref 39.0–52.0)
Hemoglobin: 5.9 g/dL — CL (ref 13.0–17.0)
MCH: 35.8 pg — ABNORMAL HIGH (ref 26.0–34.0)
MCHC: 33.7 g/dL (ref 30.0–36.0)
MCV: 106.1 fL — ABNORMAL HIGH (ref 80.0–100.0)
Platelets: 163 10*3/uL (ref 150–400)
RBC: 1.65 MIL/uL — ABNORMAL LOW (ref 4.22–5.81)
WBC: 1.6 10*3/uL — ABNORMAL LOW (ref 4.0–10.5)
nRBC: 0 % (ref 0.0–0.2)

## 2022-11-08 LAB — AMMONIA: Ammonia: 12 umol/L (ref 9–35)

## 2022-11-08 LAB — CBG MONITORING, ED
Glucose-Capillary: 131 mg/dL — ABNORMAL HIGH (ref 70–99)
Glucose-Capillary: 149 mg/dL — ABNORMAL HIGH (ref 70–99)
Glucose-Capillary: 126 mg/dL — ABNORMAL HIGH (ref 70–99)
Glucose-Capillary: 123 mg/dL — ABNORMAL HIGH (ref 70–99)

## 2022-11-08 LAB — PROCALCITONIN: Procalcitonin: 0.24 ng/mL

## 2022-11-08 LAB — HEMOGLOBIN AND HEMATOCRIT, BLOOD
HCT: 22.8 % — ABNORMAL LOW (ref 39.0–52.0)
Hemoglobin: 7.9 g/dL — ABNORMAL LOW (ref 13.0–17.0)

## 2022-11-08 LAB — VITAMIN B12: Vitamin B-12: 1448 pg/mL — ABNORMAL HIGH (ref 180–914)

## 2022-11-08 LAB — FOLATE: Folate: 19.3 ng/mL (ref >5.9)

## 2022-11-08 LAB — FERRITIN: Ferritin: 483 ng/mL — ABNORMAL HIGH (ref 24–336)

## 2022-11-08 LAB — GLUCOSE, CAPILLARY
Glucose-Capillary: 112 mg/dL — ABNORMAL HIGH (ref 70–99)
Glucose-Capillary: 114 mg/dL — ABNORMAL HIGH (ref 70–99)

## 2022-11-08 LAB — LACTATE DEHYDROGENASE: LDH: 193 U/L — ABNORMAL HIGH (ref 98–192)

## 2022-11-08 MED ORDER — ALFUZOSIN HCL ER 10 MG PO TB24
10.0000 mg | ORAL_TABLET | Freq: Every day | ORAL | Status: DC
  Administered 2022-11-08: 10 mg via ORAL
  Filled 2022-11-08 (×2): qty 1

## 2022-11-08 MED ORDER — INSULIN ASPART 100 UNIT/ML IJ SOLN
0.0000 [IU] | Freq: Three times a day (TID) | INTRAMUSCULAR | Status: DC
  Administered 2022-11-08 (×2): 1 [IU] via SUBCUTANEOUS

## 2022-11-08 MED ORDER — SODIUM CHLORIDE 0.9 % IV SOLN
500.0000 mg | INTRAVENOUS | Status: DC
  Administered 2022-11-08 (×2): 500 mg via INTRAVENOUS
  Filled 2022-11-08 (×2): qty 5

## 2022-11-08 MED ORDER — LEVALBUTEROL HCL 0.63 MG/3ML IN NEBU: 0.6300 mg | INHALATION_SOLUTION | Freq: Three times a day (TID) | RESPIRATORY_TRACT | Status: DC | PRN

## 2022-11-08 MED ORDER — DILTIAZEM HCL ER COATED BEADS 120 MG PO CP24
120.0000 mg | ORAL_CAPSULE | Freq: Every day | ORAL | Status: DC
  Administered 2022-11-08 – 2022-11-09 (×2): 120 mg via ORAL
  Filled 2022-11-08 (×2): qty 1

## 2022-11-08 MED ORDER — SODIUM CHLORIDE 0.9% IV SOLUTION: Freq: Once | INTRAVENOUS | Status: AC

## 2022-11-08 MED ORDER — FUROSEMIDE 10 MG/ML IJ SOLN
20.0000 mg | Freq: Every day | INTRAMUSCULAR | Status: DC
  Administered 2022-11-08 – 2022-11-09 (×2): 20 mg via INTRAVENOUS
  Filled 2022-11-08 (×2): qty 2

## 2022-11-08 MED ORDER — FUROSEMIDE 10 MG/ML IJ SOLN
40.0000 mg | Freq: Once | INTRAMUSCULAR | Status: AC
  Administered 2022-11-08: 40 mg via INTRAVENOUS
  Filled 2022-11-08: qty 4

## 2022-11-08 MED ORDER — LIDOCAINE HCL (PF) 1 % IJ SOLN: 5.0000 mL | Freq: Once | INTRAMUSCULAR | Status: DC

## 2022-11-08 MED ORDER — FINASTERIDE 5 MG PO TABS
5.0000 mg | ORAL_TABLET | Freq: Every day | ORAL | Status: DC
  Administered 2022-11-08: 5 mg via ORAL
  Filled 2022-11-08: qty 1

## 2022-11-08 MED ORDER — ATORVASTATIN CALCIUM 10 MG PO TABS
20.0000 mg | ORAL_TABLET | Freq: Every day | ORAL | Status: DC
  Administered 2022-11-08 – 2022-11-09 (×2): 20 mg via ORAL
  Filled 2022-11-08 (×2): qty 2

## 2022-11-08 MED ORDER — INSULIN ASPART 100 UNIT/ML IJ SOLN: 0.0000 [IU] | INTRAMUSCULAR | Status: DC

## 2022-11-08 MED ORDER — SODIUM CHLORIDE 0.9 % IV SOLN
1.0000 g | INTRAVENOUS | Status: DC
  Administered 2022-11-08 (×2): 1 g via INTRAVENOUS
  Filled 2022-11-08 (×3): qty 10

## 2022-11-08 MED ORDER — INSULIN ASPART 100 UNIT/ML IJ SOLN: 0.0000 [IU] | Freq: Every day | INTRAMUSCULAR | Status: DC

## 2022-11-08 NOTE — Progress Notes (Signed)
PROGRESS NOTE    [name]  PRF:[id number] DOB: [birth year] DOA: 11/07/2022 PCP: Lois Huxley, PA    Brief Narrative:   James Sweeney is a 82 y.o. 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  His oxygen saturation was 70% on his home 3 L nasal cannula and was placed on nonrebreather by EMS.  Patient has been on 2.5-3 L home O2 since March.  Over the past 2-3 days he has been increasing and more confused.  Patient lives with his family and grandson, has not noticed any hematemesis, hematochezia, melena or hematuria.  In the ED, temperature 95.0 F, HR 135, RR 32, BP 159/90, SpO2 100% NRB.  WBC 1.5, hemoglobin 5.4, platelets 174.  Sodium 136, potassium 4.6, chloride 102, CO2 23, glucose 180, BUN 19, creatinine 1.41.  AST 26, ALT 27, total bilirubin 1.7.  BNP 325.9.  INR was 3.8.  COVID-19/influenza A/B/RSV PCR negative.  FOBT negative.  CT head without contrast with no acute intracranial process.  Chest x-ray with near opacification right hemithorax concerning for large pleural effusion and possible right lung consolidation.  Patient underwent thoracentesis by IR with 1.8 L fluid removed.  After thoracentesis, respiratory status improved and de-escalated to 3 L nasal cannula with also improvement in heart rate.  Repeat chest x-ray showing marked interval decrease in opacification of the right hemithorax suggesting significant interval decrease in large right pleural effusion and no evidence of pneumothorax. Did show homogenous opacity in the right mid and right lower lung fields suggesting underlying atelectasis/pneumonia and possibly loculated effusion.  Also showing increased interstitial markings in the left perihilar region which may suggest mild interstitial edema. Increased density in the left lower lung fields may be due to pleural effusion and underlying atelectasis/pneumonia.   Patient received Ativan in the ED and currently very somnolent. He is able to wake up but falls asleep quickly and not able to answer any questions. History provided by his grandson at bedside who states since after patient's hospital admission in March of last year, he has not followed up with oncology. Grandson states patient has repeatedly told family that he does not wish to pursue any further workup or treatment for his cancer. Grandson states patient has been very weak for the past 2 weeks and has no energy. He is not eating or drinking much. He also had a cough which has improved but his breathing has continued to worsen. He has been on 2.5 to 3 L home oxygen since March. For the past 2 or 3 days he has been increasingly more confused. Per grandson, patient's breathing has improved significantly after thoracentesis. Patient lives with his family and grandson has not noticed any hematemesis, hematochezia, melena, or hematuria.   Type and screen performed and 2 unit PRBC ordered.  Patient also received albuterol neb.  TRH consulted for further evaluation and management of acute hypoxic respite failure secondary to pleural effusion and symptomatic anemia.  Assessment & Plan:   Acute on chronic hypoxic respiratory failure (2.5-3L [place] baseline) Large right pleural effusion Commune acquired pneumonia Acute on chronic diastolic congestive heart failure Patient presenting to ED with progressive shortness of breath, confusion.  Patient is afebrile without leukocytosis.  COVID-19, influenza A/RSV  PCR negative.  Chest x-ray with near opacification right hemithorax concerning for large right pleural effusion.  Patient underwent IR thoracentesis on 1/5 with removal of 1.8 L of  fluid. -- Azithromycin 500 mg IV every 24 hours -- Ceftriaxone 1 g IV every 24 hours -- Furosemide 20 mg IV every 24 hours -- Xopenex neb every 8 hours as needed wheezing/shortness of breath -- Strict I's and O's and daily weights -- Continue supplemental oxygen, maintain SpO2 greater than 92%  Permanent atrial fibrillation with RVR Essential hypertension Likely precipitated by large pleural effusion versus pneumonia.  Improved following thoracentesis. -- Diltiazem CD 120 mg p.o. daily -- Hold valsartan and Xarelto for now -- Continue monitor on telemetry  Acute metabolic encephalopathy Likely secondary to hypoxia and infection.  Family reported increased confusion over the past 2-3 days.  CT head without contrast with no acute intracranial findings.  Ammonia level within normal limits.  Did receive IV Ativan in the ED likely contributing factor. --Supportive care  Marginal zone lymphoma/MALT lymphoma Last seen by medical oncology, Dr. Irene Limbo outpatient 08/2021.  Repeat imaging CT chest/abdomen/pelvis November 2022 with right upper lobe peripheral pulmonary mass along pleura slightly larger with mass being hypermetabolic on comparison PET/CT dated 02/2021 and left lower lobe subsolid nodule increased in size.  Oncology did discuss with patient and his family regarding further evaluation with bone marrow biopsy and oncology thought at that time that the patient would not be able to tolerate any aggressive intervention given his age and medical comorbidities.  Patient has also reportedly told family that he does not wish to pursue any further workup or treatment for his cancer. --Palliative care consulted  Chronic pancytopenia Symptomatic anemia Given his underlying lymphoma, concern for bone marrow infiltration.  WBC count 1.5 with absolute lymphocyte count 0.2, hemoglobin 5.4 with MCV 124.2.  Platelet count 174.  No signs or symptoms of bleeding, light brown stool on rectal exam and FOBT  negative.  Patient had declined bone marrow biopsy in the past.  Anemia panel with iron 92, TIBC 132, ferritin 43, folate 19.3, vitamin B12 1448.  Transfused 2 unit PRBCs on 1/5. -- Hgb 5.4>5.9 -- Transfuse 2 additional unit PRBCs -- Continue monitor telemetry -- Repeat H&H 2 hours following transfusion, CBC daily  CKD stage IIIa Baseline creatinine 1.2-1.4. -- Cr 1.41>1.37, stable -- Avoid nephrotoxins, renal dose all medications -- BMP daily  Type 2 diabetes mellitus Diet controlled, hemoglobin A1c 5.03 January 2022. --SSI for coverage --CBGs qAC/HS  Hyperlipidemia -- Atorvastatin  BPH -- Finasteride 5 mg p.o. nightly -- Alfuzosin 10 mg p.o. nightly   DVT prophylaxis:     Code Status: Full Code Family Communication: Updated grandson who is the POA at bedside this morning  Disposition Plan:  Level of care: Progressive Status is: Inpatient Remains inpatient appropriate because: Continues with severe anemia despite transfusion, close monitoring of respiratory status, IV Lasix, IV antibiotics    Consultants:  Palliative care  Procedures:  Thoracentesis, IR 1/5  Antimicrobials:  Azithromycin 1/5>> Ceftriaxone 1/5>>   Subjective: Patient seen examined bedside, resting comfortably.  Hard of hearing.  Grandson present.  No specific complaints at this point.  Breathing much improved.  Remains on 3 L nasal cannula.  Transfused 2 unit PRBCs overnight.  Hemoglobin only rise to 5.9, transfused 2 additional units.  Concerned about bone marrow infiltration from lymphoma causing pancytopenia.  Palliative care consulted.  Overall prognosis poor; as previous discussions with his oncologist with recommendations against treatment given ability to  tolerate and patient has also declined further workup in the past.  Objective: Vitals:   11/08/22 1403 11/08/22 1600 11/08/22 1705 11/08/22 1706  BP: 136/63  139/64   Pulse: 86 (!) 118  80  Resp: (!) 30 (!) 23  (!) 22  Temp: 98.5 F (36.9  C) 98.6 F (37 C)  98.5 F (36.9 C)  TempSrc: Oral Oral  Oral  SpO2: 95% 100%  96%  Weight:      Height:        Intake/Output Summary (Last 24 hours) at 11/08/2022 1758 Last data filed at 11/08/2022 1623 Gross per 24 hour  Intake 1703.27 ml  Output 2625 ml  Net -921.73 ml   Filed Weights   11/07/22 1812  Weight: 84.5 kg    Examination:  Physical Exam: GEN: NAD, alert, hard of hearing, chronically ill in appearance, appears older than stated age HEENT: NCAT, PERRL, EOMI, sclera clear, MMM PULM: Breath sounds diminished right base, normal respiratory effort without accessory muscle use, no wheezing/crackles, on 2.5 L nasal cannula with SpO2 100% at rest CV: Irregularly irregular rhythm, normal rate w/o M/G/R GI: abd soft, NTND, NABS, no R/G/M MSK: no peripheral edema, moves all extremities presented pending NEURO: No focal neurological deficits noted PSYCH: normal mood/affect Integumentary: dry/intact, no rashes or wounds    Data Reviewed: I have personally reviewed following labs and imaging studies  CBC: Recent Labs  Lab 11/07/22 1803 11/08/22 1030  WBC 1.5* 1.6*  NEUTROABS 1.3*  --   HGB 5.4* 5.9*  HCT 16.4* 17.5*  MCV 124.2* 106.1*  PLT 174 563   Basic Metabolic Panel: Recent Labs  Lab 11/07/22 1803 11/08/22 1030  NA 136 136  K 4.6 4.0  CL 102 101  CO2 23 27  GLUCOSE 180* 121*  BUN 19 21  CREATININE 1.41* 1.37*  CALCIUM 8.5* 7.8*   GFR: Estimated Creatinine Clearance: 43.7 mL/min (A) (by C-G formula based on SCr of 1.37 mg/dL (H)). Liver Function Tests: Recent Labs  Lab 11/07/22 1803  AST 26  ALT 27  ALKPHOS 129*  BILITOT 1.7*  PROT 6.6  ALBUMIN 3.1*   No results for input(s): "LIPASE", "AMYLASE" in the last 168 hours. Recent Labs  Lab 11/08/22 1030  AMMONIA 12   Coagulation Profile: Recent Labs  Lab 11/07/22 1818  INR 3.8*   Cardiac Enzymes: No results for input(s): "CKTOTAL", "CKMB", "CKMBINDEX", "TROPONINI" in the last 168  hours. BNP (last 3 results) No results for input(s): "PROBNP" in the last 8760 hours. HbA1C: No results for input(s): "HGBA1C" in the last 72 hours. CBG: Recent Labs  Lab 11/07/22 2212 11/08/22 0712 11/08/22 0900 11/08/22 1139 11/08/22 1556  GLUCAP 130* 131* 149* 126* 123*   Lipid Profile: No results for input(s): "CHOL", "HDL", "LDLCALC", "TRIG", "CHOLHDL", "LDLDIRECT" in the last 72 hours. Thyroid Function Tests: No results for input(s): "TSH", "T4TOTAL", "FREET4", "T3FREE", "THYROIDAB" in the last 72 hours. Anemia Panel: Recent Labs    11/08/22 1030  VITAMINB12 1,448*  FOLATE 19.3  FERRITIN 483*  TIBC 132*  IRON 92   Sepsis Labs: Recent Labs  Lab 11/08/22 1030  PROCALCITON 0.24    Recent Results (from the past 240 hour(s))  Resp panel by RT-PCR (RSV, Flu A&B, Covid) Anterior Nasal Swab     Status: None   Collection Time: 11/07/22  6:03 PM   Specimen: Anterior Nasal Swab  Result Value Ref Range Status   SARS Coronavirus 2 by RT PCR NEGATIVE NEGATIVE Final  Comment: (NOTE) SARS-CoV-2 target nucleic acids are NOT DETECTED.  The SARS-CoV-2 RNA is generally detectable in upper respiratory specimens during the acute phase of infection. The lowest concentration of SARS-CoV-2 viral copies this assay can detect is 138 copies/mL. A negative result does not preclude SARS-Cov-2 infection and should not be used as the sole basis for treatment or other patient management decisions. A negative result may occur with  improper specimen collection/handling, submission of specimen other than nasopharyngeal swab, presence of viral mutation(s) within the areas targeted by this assay, and inadequate number of viral copies(<138 copies/mL). A negative result must be combined with clinical observations, patient history, and epidemiological information. The expected result is Negative.  Fact Sheet for Patients:  EntrepreneurPulse.com.au  Fact Sheet for  Healthcare Providers:  IncredibleEmployment.be  This test is no t yet approved or cleared by the [place] FDA and  has been authorized for detection and/or diagnosis of SARS-CoV-2 by FDA under an Emergency Use Authorization (EUA). This EUA will remain  in effect (meaning this test can be used) for the duration of the COVID-19 declaration under Section 564(b)(1) of the Act, 21 U.S.C.section 360bbb-3(b)(1), unless the authorization is terminated  or revoked sooner.       Influenza A by PCR NEGATIVE NEGATIVE Final   Influenza B by PCR NEGATIVE NEGATIVE Final    Comment: (NOTE) The Xpert Xpress SARS-CoV-2/FLU/RSV plus assay is intended as an aid in the diagnosis of influenza from Nasopharyngeal swab specimens and should not be used as a sole basis for treatment. Nasal washings and aspirates are unacceptable for Xpert Xpress SARS-CoV-2/FLU/RSV testing.  Fact Sheet for Patients: EntrepreneurPulse.com.au  Fact Sheet for Healthcare Providers: IncredibleEmployment.be  This test is not yet approved or cleared by the [place] FDA and has been authorized for detection and/or diagnosis of SARS-CoV-2 by FDA under an Emergency Use Authorization (EUA). This EUA will remain in effect (meaning this test can be used) for the duration of the COVID-19 declaration under Section 564(b)(1) of the Act, 21 U.S.C. section 360bbb-3(b)(1), unless the authorization is terminated or revoked.     Resp Syncytial Virus by PCR NEGATIVE NEGATIVE Final    Comment: (NOTE) Fact Sheet for Patients: EntrepreneurPulse.com.au  Fact Sheet for Healthcare Providers: IncredibleEmployment.be  This test is not yet approved or cleared by the [place] FDA and has been authorized for detection and/or diagnosis of SARS-CoV-2 by FDA under an Emergency Use Authorization (EUA). This EUA will remain in effect (meaning this  test can be used) for the duration of the COVID-19 declaration under Section 564(b)(1) of the Act, 21 U.S.C. section 360bbb-3(b)(1), unless the authorization is terminated or revoked.  Performed at [facility], [address], [place], [place] [address]   Body fluid culture w Gram Stain     Status: None (Preliminary result)   Collection Time: 11/07/22  9:38 PM   Specimen: Pleura; Body Fluid  Result Value Ref Range Status   Specimen Description PLEURAL FLUID  Final   Special Requests RIGHT  Final   Gram Stain   Final    RARE WBC PRESENT, PREDOMINANTLY MONONUCLEAR NO ORGANISMS SEEN Performed at [facility], [address], [place], [place] [address]    Culture PENDING  Incomplete   Report Status PENDING  Incomplete         Radiology Studies: ECHOCARDIOGRAM COMPLETE  Result Date: 11/08/2022    ECHOCARDIOGRAM REPORT   Patient Name:   ORESTE MAJEED Date of Exam: 11/08/2022 Medical Rec #:  [id number]       Height:       70.0 in Accession #:    [id number]      Weight:       186.3 lb Date of Birth:  1941-06-05       BSA:          2.025 m Patient Age:    72 years        BP:           133/60 mmHg Patient Gender: M               HR:           81 bpm. Exam Location:  Inpatient Procedure: 2D Echo Indications:    CHF  History:        Patient has prior history of Echocardiogram examinations, most                 recent 01/06/2022. CHF, Arrythmias:Atrial Fibrillation; Risk                 Factors:Hypertension and Diabetes.  Sonographer:    Harvie Junior Referring Phys: [id number] [place]  1. Left ventricular ejection fraction, by estimation, is 50 to 55%. The left ventricle has low normal function. The left ventricle has no regional wall motion abnormalities. Left ventricular diastolic function could not be evaluated.  2. Right ventricular systolic function is normal. The right ventricular size is normal. There is normal pulmonary artery systolic pressure. The estimated  right ventricular systolic pressure is 85.0 mmHg.  3. Left atrial size was mildly dilated.  4. Right atrial size was mildly dilated.  5. A small pericardial effusion is present. The pericardial effusion is localized near the right atrium. Trivial pericardial effusion is noted near posterior wall of left ventricle.  6. The mitral valve is grossly normal. Trivial mitral valve regurgitation. No evidence of mitral stenosis.  7. The aortic valve is tricuspid. There is mild calcification of the aortic valve. Aortic valve regurgitation is not visualized. No aortic stenosis is present.  8. The inferior vena cava is dilated in size with <50% respiratory variability, suggesting right atrial pressure of 15 mmHg. Comparison(s): Changes from prior study are noted. Low normal LVEF now. FINDINGS  Left Ventricle: Left ventricular ejection fraction, by estimation, is 50 to 55%. The left ventricle has low normal function. The left ventricle has no regional wall motion abnormalities. The left ventricular internal cavity size was normal in size. There is borderline left ventricular hypertrophy. Left ventricular diastolic function could not be evaluated due to atrial fibrillation. Left ventricular diastolic function could not be evaluated. Right Ventricle: The right ventricular size is normal. No increase in right ventricular wall thickness. Right ventricular systolic function is normal. There is normal pulmonary artery systolic pressure. The tricuspid regurgitant velocity is 1.74 m/s, and  with an assumed right atrial pressure of 15 mmHg, the estimated right ventricular systolic pressure is 27.7 mmHg. Left Atrium: Left atrial size was mildly dilated. Right Atrium: Right atrial size was mildly dilated. Pericardium: A small pericardial effusion is present. The pericardial effusion is localized near the right atrium. Mitral Valve: The mitral valve is grossly normal. There is mild calcification of the anterior mitral valve leaflet(s). Mild  mitral annular calcification. Trivial mitral valve regurgitation. No evidence of mitral valve stenosis. Tricuspid Valve: The tricuspid valve is normal in structure. Tricuspid valve regurgitation is not demonstrated. No evidence of tricuspid stenosis. Aortic Valve: The aortic valve is tricuspid. There is  mild calcification of the aortic valve. There is mild aortic valve annular calcification. Aortic valve regurgitation is not visualized. No aortic stenosis is present. Aortic valve mean gradient measures 3.0 mmHg. Aortic valve peak gradient measures 5.4 mmHg. Aortic valve area, by VTI measures 2.86 cm. Pulmonic Valve: The pulmonic valve was normal in structure. Pulmonic valve regurgitation is mild. No evidence of pulmonic stenosis. Aorta: The aortic root and ascending aorta are structurally normal, with no evidence of dilitation. Venous: The inferior vena cava is dilated in size with less than 50% respiratory variability, suggesting right atrial pressure of 15 mmHg. IAS/Shunts: No atrial level shunt detected by color flow Doppler. Additional Comments: There is a small pleural effusion in the right lateral region.  LEFT VENTRICLE PLAX 2D LVIDd:         5.10 cm      Diastology LVIDs:         3.50 cm      LV e' medial:    11.60 cm/s LV PW:         1.10 cm      LV E/e' medial:  9.7 LV IVS:        1.10 cm      LV e' lateral:   11.11 cm/s LVOT diam:     2.10 cm      LV E/e' lateral: 10.1 LV SV:         67 LV SV Index:   33 LVOT Area:     3.46 cm  LV Volumes (MOD) LV vol d, MOD A2C: 101.0 ml LV vol d, MOD A4C: 99.0 ml LV vol s, MOD A2C: 53.4 ml LV vol s, MOD A4C: 29.9 ml LV SV MOD A2C:     47.6 ml LV SV MOD A4C:     99.0 ml LV SV MOD BP:      57.4 ml RIGHT VENTRICLE RV Basal diam:  3.70 cm RV Mid diam:    2.90 cm RV S prime:     11.47 cm/s TAPSE (M-mode): 1.7 cm LEFT ATRIUM             Index        RIGHT ATRIUM           Index LA diam:        3.60 cm 1.78 cm/m   RA Area:     19.60 cm LA Vol (A2C):   71.2 ml 35.13 ml/m   RA Volume:   54.10 ml  26.71 ml/m LA Vol (A4C):   73.3 ml 36.19 ml/m LA Biplane Vol: 72.5 ml 35.80 ml/m  AORTIC VALVE                    PULMONIC VALVE AV Area (Vmax):    2.98 cm     PV Vmax:          1.09 m/s AV Area (Vmean):   2.85 cm     PV Peak grad:     4.8 mmHg AV Area (VTI):     2.86 cm     PR End Diast Vel: 9.12 msec AV Vmax:           116.00 cm/s AV Vmean:          76.000 cm/s AV VTI:            0.234 m AV Peak Grad:      5.4 mmHg AV Mean Grad:      3.0 mmHg LVOT Vmax:  99.90 cm/s LVOT Vmean:        62.600 cm/s LVOT VTI:          0.193 m LVOT/AV VTI ratio: 0.82  AORTA Ao Root diam: 3.40 cm Ao Asc diam:  2.80 cm MITRAL VALVE                TRICUSPID VALVE MV Area (PHT): 4.49 cm     TR Peak grad:   12.1 mmHg MV Decel Time: 169 msec     TR Vmax:        174.00 cm/s MR Peak grad: 49.0 mmHg MR Vmax:      350.00 cm/s   SHUNTS MV E velocity: 112.00 cm/s  Systemic VTI:  0.19 m MV A velocity: 34.60 cm/s   Systemic Diam: 2.10 cm MV E/A ratio:  3.24 Vishnu Priya Mallipeddi Electronically signed by Lorelee Cover Mallipeddi Signature Date/Time: 11/08/2022/12:06:02 PM    Final    US THORACENTESIS ASP PLEURAL SPACE W/IMG GUIDE  Result Date: 11/08/2022 INDICATION: 82 year old male referred for thoracentesis EXAM: ULTRASOUND GUIDED RIGHT THORACENTESIS MEDICATIONS: None. COMPLICATIONS: None PROCEDURE: An ultrasound guided thoracentesis was thoroughly discussed with the patient and questions answered. The benefits, risks, alternatives and complications were also discussed. The patient understands and wishes to proceed with the procedure. Written consent was obtained. Ultrasound was performed to localize and mark an adequate pocket of fluid in the right chest. The area was then prepped and draped in the normal sterile fashion. 1% Lidocaine was used for local anesthesia. Under ultrasound guidance a 8 Fr Safe-T-Centesis catheter was introduced. Thoracentesis was performed. We elected to stop after 1.8 L of fluid  drainage secondary to patient's discomfort of cough and chest pain. The catheter was removed and a dressing applied. FINDINGS: A total of approximately 1.8 L of thin yellow fluid was removed. Samples were sent to the laboratory as requested by the clinical team. IMPRESSION: Status post ultrasound-guided right-sided thoracentesis, 1.8 L. Signed, Dulcy Fanny. Nadene Rubins, RPVI [facility] Electronically Signed   By: Corrie Mckusick D.O.   On: 11/08/2022 09:28   CT HEAD WO CONTRAST (5MM)  Result Date: 11/08/2022 CLINICAL DATA:  Delirium EXAM: CT HEAD WITHOUT CONTRAST TECHNIQUE: Contiguous axial images were obtained from the base of the skull through the vertex without intravenous contrast. RADIATION DOSE REDUCTION: This exam was performed according to the departmental dose-optimization program which includes automated exposure control, adjustment of the mA and/or kV according to patient size and/or use of iterative reconstruction technique. COMPARISON:  01/08/2022 FINDINGS: Brain: No evidence of acute infarction, hemorrhage, mass, mass effect, or midline shift. No hydrocephalus or extra-axial fluid collection. Periventricular white matter changes, likely the sequela of chronic small vessel ischemic disease. Age related cerebral atrophy. Vascular: No hyperdense vessel. Atherosclerotic calcifications in the intracranial carotid and vertebral arteries. Skull: Normal. Negative for fracture or focal lesion. Sinuses/Orbits: Clear paranasal sinuses. Status post bilateral lens replacements. Other: The mastoid air cells are well aerated. IMPRESSION: No acute intracranial process. Electronically Signed   By: Merilyn Baba M.D.   On: 11/08/2022 01:26   DG CHEST PORT 1 VIEW  Result Date: 11/07/2022 CLINICAL DATA:  Status post right thoracentesis EXAM: PORTABLE CHEST 1 VIEW COMPARISON:  Previous studies including the examination done earlier today FINDINGS: Transverse  diameter of heart is slightly increased. Central pulmonary vessels are prominent. There is significant interval improvement in aeration in right hemithorax suggesting marked interval decrease in right pleural effusion after thoracentesis. There are  increased markings in the left parahilar region. There is increased opacity in medial left lower lung field which may be due to pleural effusion and underlying infiltrate. There is blunting of both lateral CP angles. There is no pneumothorax. IMPRESSION: There is marked interval decrease in opacification of right hemithorax suggesting significant interval decrease in large right pleural effusion. There is no pneumothorax. Homogeneous opacity in right mid and right lower lung fields may suggest underlying atelectasis/pneumonia and possibly loculated effusion. Increased interstitial markings in the left parahilar region may suggest mild interstitial edema. Increased density in left lower lung fields may be due to pleural effusion and underlying atelectasis/pneumonia. Electronically Signed   By: Elmer Picker M.D.   On: 11/07/2022 21:17   DG Chest Port 1 View  Result Date: 11/07/2022 CLINICAL DATA:  Short of breath, history of lymphoma EXAM: PORTABLE CHEST 1 VIEW COMPARISON:  01/05/2022, 09/16/2021 FINDINGS: Single frontal view of the chest demonstrates near complete opacification of the right hemithorax, consistent with a combination of lung consolidation and pleural effusion. Vascular redistribution throughout the left lung. Otherwise the left chest is clear. The cardiac silhouette is partially obscured. No pneumothorax. No acute bony abnormality. IMPRESSION: 1. Near complete opacification of the right hemithorax, consistent with a combination of right lung consolidation and pleural effusion. In this patient with a history of prior lymphoma and abnormal chest CT findings, repeat CT chest with IV contrast may be useful if the patient would be a therapy candidate  should neoplasm be detected. Electronically Signed   By: Randa Ngo M.D.   On: 11/07/2022 18:32        Scheduled Meds:  alfuzosin  10 mg Oral QHS   atorvastatin  20 mg Oral Daily   diltiazem  120 mg Oral Daily   finasteride  5 mg Oral QHS   furosemide  20 mg Intravenous Daily   insulin aspart  0-5 Units Subcutaneous QHS   insulin aspart  0-9 Units Subcutaneous TID WC   Continuous Infusions:  azithromycin Stopped (11/08/22 0331)   cefTRIAXone (ROCEPHIN)  IV Stopped (11/08/22 0223)     LOS: 1 day    Time spent: 52 minutes spent on chart review, discussion with nursing staff, consultants, updating family and interview/physical exam; more than 50% of that time was spent in counseling and/or coordination of care.    Ansen Sayegh J [place] DO [facility] Available via Epic secure chat 7am-7pm After these hours, please refer to coverage provider listed on [email or web address] 11/08/2022, 5:58 PM

## 2022-11-08 NOTE — ED Notes (Signed)
Patient resting in bed with grandson chase at bedside.

## 2022-11-08 NOTE — ED Notes (Signed)
DR Marlowe Sax advised we are not able to draw Labs until 2 hours after Both Units of PRBCs are given.  ABX will be started without cultures drawn

## 2022-11-08 NOTE — ED Notes (Signed)
Pt's family given a recliner to make more comfortable.

## 2022-11-08 NOTE — Progress Notes (Signed)
MD notified of patient's post transfusion hgb result of 3.8. States to redraw lab and contact night shift hospitalist with result. Lab placed STAT. Patient's vital signs remain stable and patient appears to be in no acute distress.

## 2022-11-08 NOTE — ED Notes (Signed)
Patient reports being hungry, RN provided applesauce and NT provided PB and crackers. Patient resting and comfortable, grandson at bedside. No complaints at this time.

## 2022-11-08 NOTE — Consult Note (Signed)
Consultation Note Date: 11/08/2022   Patient Name: James Sweeney  DOB: Jun 25, 1941  MRN: [id number]  Age / Sex: 82 y.o., male  PCP: James Sweeney, [place] Referring Physician: [place] James J, DO  Reason for Consultation: Establishing goals of care  HPI/Patient Profile: 82 y.o. 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   In the ED CXR showed near complete opacification of the right hemithorax concerning for a very large pleural effusion and possible right lung consolidation. Patient underwent thoracentesis by IR and 1.8 L fluid removed. He is admitted for acute on chronic respiratory failure, CAP, acute on chronic CHF, acute metabolic encephalopathy, worsening symptomatic anemia.  PMT has been consulted to assist with goals of care conversation.  Clinical Assessment and Goals of Care:  I have reviewed medical records including EPIC notes, labs and imaging, received report from RN, assessed the patient and then met at the bedside with patient's grandson James Sweeney to discuss diagnosis prognosis, GOC, EOL wishes, disposition and options.  I introduced Palliative Medicine as specialized medical care for people living with serious illness. It focuses on providing relief from the symptoms and stress of a serious illness. The goal is to improve quality of life for both the patient and the family.  We discussed a brief life review of the patient and then focused on their current illness.   I attempted to elicit values and goals of care important to the patient.    Medical History Review and Understanding:  Reviewed patient's progressive  decline over the past month as well as extensive review of his chronic comorbidities.  Patient had the mass for 30 years and grandson reports there were no issues until about 6 years ago, when Dr. Irene Sweeney "poked it" resulting in prolonged hospitalization and the tumor began growing after that.  He is uncertain of patient's long-term prognosis and was hoping for another visit with oncology to discuss this, however they had to miss the outpatient visit last week due to patient's acute illness.  [name] feels patient is doing much better after thoracentesis.  Social History: James Sweeney states patient lives with his son, who has his own health concerns, and his wife who has dementia.  [name] visits once or twice daily, every evening after work and tries as much as he can to visit in the morning to help him get ready for the day.  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 He is a Engineer, manufacturing.   Functional and Nutritional State: Patient was functioning well until about a month ago. He has not been able to ambulate and has had a poor appetite since then. He previously used a walker and bedside commode.  Palliative Symptoms: Dyspnea, improving  Advance Directives: A detailed discussion regarding advanced directives was had. HCPOA/Living Will on file was reviewed. HCPOA is grandson Radio broadcast assistant.  Code Status: Concepts specific to code status, artifical feeding and hydration, and rehospitalization were considered and discussed.  [name] feels patient would want to be a full code at this time and consider DNR if he were to decline further.  Discussion: James Sweeney states the primary goal is to have more physical therapy and caregiver support, as it is becoming more difficult  for him to keep up with the patient's needs. His wife has been managing medications by giving him the pill box, although she has dementia herself and sometimes might not. Patient's son  also has trouble following instructions to adjust his oxygen. [name] would like more information about patient's goal saturations. Counseled that home health may be an option, although this does not include assistance with ADLs and that is typically an out of pocket expense. He thinks this would be sufficient support. He agrees patient's quality of life is important and states patient is "as strong as an ox" and needs further strengthening to be able to enjoy his favorite hobbies again. I shared my concern that patient's incurable disease, lymphoma, will continue to progress without treatment and that patient's advance directives clearly indicate he would not want life-prolonging interventions in this type of situation. Noted that patient clearly does not want treatment for his lymphoma either. James Sweeney has been encouraging patient to consider further diagnostics to determine staging and prognosis, even if he does not want treatment. He states, "what am I supposed to do, let him die?" In reference to previous concern about increasing oxygen without a humidifier. He does now have the humidifier. He is concerned the pulmonary does not have an outpatient appointment until Feb 18th and we discussed possibly obtaining oncology and pulmonology input while patient is admitted. He is Patent attorney. He is also open to ongoing goals of care discussions pending clinical course and further information to clarify long-term prognosis.      Hospice and Palliative Care services outpatient were explained and offered.   Discussed the importance of continued conversation with family and the medical providers regarding overall plan of care and treatment options, ensuring decisions are within the context of the patient's values and GOCs.   Questions and concerns were addressed.  Hard Choices booklet left for review. The family was encouraged to call with questions or concerns.  PMT will continue to support holistically.   SUMMARY  OF RECOMMENDATIONS   -Continue full code/full scope treatment -Patient's grandson is hopeful for [facility] referral for PT/nursing support -He is also interested in input from oncology and pulmonology regarding patient's lymphoma progression, discussed with Dr. [place]  -Ongoing goals of care discussions pending clinical course -Psychosocial and emotional support provided -PMT will continue to follow and support   Prognosis:  Poor long-term prognosis given underlying malignancy and poor candidate/no desire for treatment, several chronic comorbidities, acute illness, and functional/nutritional/cognitive decline  Discharge Planning: To Be Determined      Primary Diagnoses: Present on Admission:  Acute hypoxemic respiratory failure ([place])  CAP (community acquired pneumonia)  Acute on chronic diastolic CHF (congestive heart failure) ([facility])  Essential hypertension  BPH (benign prostatic hyperplasia)  Hypercholesterolemia  Marginal zone lymphoma [facility])   Physical Exam Vitals and nursing note reviewed.  Constitutional:      General: He is sleeping. He is not in acute distress.    Interventions: Nasal cannula in place.     Comments: 3L  Cardiovascular:     Rate and Rhythm: Normal rate.  Pulmonary:     Effort: Pulmonary effort is normal. No respiratory distress.    Vital Signs: BP (!) 127/93   Pulse 79   Temp 98.1 F (36.7 C) (Oral)   Resp 17   Ht '5\' 10"'$  (1.778 m)   Wt 84.5 kg   SpO2 98%   BMI 26.73 kg/m  Pain Scale: 0-10   Pain Score: 0-No pain   SpO2: SpO2: 98 %  O2 Device:SpO2: 98 % O2 Flow Rate: .O2 Flow Rate (L/min): 3 L/min   Palliative Assessment/Data: 30%      Total time: I spent 95 minutes in the care of the patient today in the above activities and documenting the encounter.  MDM: high   Aveen Stansel Johnnette Litter, PA-C  Palliative Medicine Team Team phone # [phone]  Thank you for allowing the Palliative Medicine Team to assist in the care of this  patient. Please utilize secure chat with additional questions, if there is no response within 30 minutes please call the above phone number.  Palliative Medicine Team providers are available by phone from 7am to 7pm daily and can be reached through the team cell phone.  Should this patient require assistance outside of these hours, please call the patient's attending physician.

## 2022-11-08 NOTE — Progress Notes (Signed)
  Echocardiogram 2D Echocardiogram has been performed.  James Sweeney 11/08/2022, 11:28 AM

## 2022-11-09 ENCOUNTER — Other Ambulatory Visit: Payer: Self-pay

## 2022-11-09 DIAGNOSIS — J9601 Acute respiratory failure with hypoxia: Secondary | ICD-10-CM | POA: Diagnosis not present

## 2022-11-09 LAB — TYPE AND SCREEN
ABO/RH(D): O POS
Antibody Screen: NEGATIVE
Unit division: 0
Unit division: 0
Unit division: 0

## 2022-11-09 LAB — BASIC METABOLIC PANEL
Anion gap: 11 (ref 5–15)
BUN: 21 mg/dL (ref 8–23)
CO2: 27 mmol/L (ref 22–32)
Calcium: 7.9 mg/dL — ABNORMAL LOW (ref 8.9–10.3)
Chloride: 98 mmol/L (ref 98–111)
Creatinine, Ser: 1.26 mg/dL — ABNORMAL HIGH (ref 0.61–1.24)
GFR, Estimated: 57 mL/min — ABNORMAL LOW (ref >60)
Glucose, Bld: 97 mg/dL (ref 70–99)
Potassium: 3.7 mmol/L (ref 3.5–5.1)
Sodium: 136 mmol/L (ref 135–145)

## 2022-11-09 LAB — BPAM RBC
Blood Product Expiration Date: 202401272359
Blood Product Expiration Date: 202402012359
Blood Product Expiration Date: 202402012359
ISSUE DATE / TIME: 202401052256
ISSUE DATE / TIME: 202401060211
ISSUE DATE / TIME: 202401061314
Unit Type and Rh: 5100
Unit Type and Rh: 5100
Unit Type and Rh: 5100

## 2022-11-09 LAB — CBC
HCT: 21.8 % — ABNORMAL LOW (ref 39.0–52.0)
Hemoglobin: 7.6 g/dL — ABNORMAL LOW (ref 13.0–17.0)
MCH: 35.8 pg — ABNORMAL HIGH (ref 26.0–34.0)
MCHC: 34.9 g/dL (ref 30.0–36.0)
MCV: 102.8 fL — ABNORMAL HIGH (ref 80.0–100.0)
Platelets: 144 10*3/uL — ABNORMAL LOW (ref 150–400)
RBC: 2.12 MIL/uL — ABNORMAL LOW (ref 4.22–5.81)
RDW: 24.7 % — ABNORMAL HIGH (ref 11.5–15.5)
WBC: 1.5 10*3/uL — ABNORMAL LOW (ref 4.0–10.5)
nRBC: 0 % (ref 0.0–0.2)

## 2022-11-09 LAB — GLUCOSE, CAPILLARY: Glucose-Capillary: 99 mg/dL (ref 70–99)

## 2022-11-09 MED ORDER — AZITHROMYCIN 500 MG PO TABS: 500.0000 mg | ORAL_TABLET | Freq: Every day | ORAL | 0 refills | Status: AC

## 2022-11-09 MED ORDER — CEFDINIR 300 MG PO CAPS: 300.0000 mg | ORAL_CAPSULE | Freq: Two times a day (BID) | ORAL | 0 refills | Status: AC

## 2022-11-09 MED ORDER — FUROSEMIDE 20 MG PO TABS: 20.0000 mg | ORAL_TABLET | Freq: Every day | ORAL | 2 refills | Status: DC

## 2022-11-09 NOTE — Progress Notes (Signed)
   Medical records reviewed including progress notes and labs. Noted patient is to be discharged today. I agree that he will benefit from outpatient palliative care follow up for ongoing goals of care discussions and anticipate will need transition to hospice once patient and grandson have received additional input from Dr. Irene Limbo, which he stated was the plan during our conversation yesterday.  Thank you for your referral and allowing PMT to assist in [name] [facility] care.   Dorthy Cooler, [facility] Palliative Medicine Team  Team Phone # [phone]   NO CHARGE

## 2022-11-09 NOTE — Discharge Summary (Signed)
Physician Discharge Summary  James Sweeney VQM:[id number] DOB: 1940/12/25 DOA: 11/07/2022  PCP: Lois Huxley, PA  Admit date: 11/07/2022 Discharge date: 11/09/2022  Admitted From: Home Disposition: Home  Recommendations for Outpatient Follow-up:  Follow up with PCP in 1-2 weeks Follow-up with medical oncology, Dr. Irene Limbo Please obtain BMP/CBC in one week to assess creatinine and hemoglobin level Referred to outpatient palliative care to follow, anticipate need of transitioning to hospice in the near future as patient has declined further workup of his lymphoma and treatment as this is a terminal illness and likely nearing end-of-life.  Continue goals of care discussions outpatient.  Home Health: PT/SW/aide/RN Equipment/Devices: Wheelchair  Discharge Condition: Stable CODE STATUS: Full code Diet recommendation: Heart healthy diet  History of present illness:  James Sweeney is a 82 y.o. 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  His oxygen saturation was 70% on his home 3 L nasal cannula and was placed on nonrebreather by EMS.  Patient has been on 2.5-3 L home O2 since March.  Over the past 2-3 days he has been increasing and more confused.  Patient lives with his family and grandson, has not noticed any hematemesis, hematochezia, melena or hematuria.   In the ED, temperature 95.0 F, HR 135, RR 32, BP 159/90, SpO2 100% NRB.  WBC 1.5, hemoglobin 5.4, platelets 174.  Sodium 136, potassium 4.6, chloride 102, CO2 23, glucose 180, BUN 19, creatinine 1.41.  AST 26, ALT 27, total bilirubin 1.7.  BNP 325.9.  INR was 3.8.  COVID-19/influenza A/B/RSV PCR  negative.  FOBT negative.  CT head without contrast with no acute intracranial process.  Chest x-ray with near opacification right hemithorax concerning for large pleural effusion and possible right lung consolidation.  Patient underwent thoracentesis by IR with 1.8 L fluid removed.  After thoracentesis, respiratory status improved and de-escalated to 3 L nasal cannula with also improvement in heart rate.  Repeat chest x-ray showing marked interval decrease in opacification of the right hemithorax suggesting significant interval decrease in large right pleural effusion and no evidence of pneumothorax. Did show homogenous opacity in the right mid and right lower lung fields suggesting underlying atelectasis/pneumonia and possibly loculated effusion. Also showing increased interstitial markings in the left perihilar region which may suggest mild interstitial edema. Increased density in the left lower lung fields may be due to pleural effusion and underlying atelectasis/pneumonia.    Patient received Ativan in the ED and currently very somnolent. He is able to wake up but falls asleep quickly and not able to answer any questions. History provided by his grandson at bedside who states since after patient's hospital admission in March of last year, he has not followed up with oncology. Grandson states patient has repeatedly told family that he does not wish to pursue any further workup or treatment for his cancer. Grandson states patient has been very weak for the past 2 weeks and has no energy. He is not eating or drinking much. He also had a cough which has improved but his breathing has continued to worsen. He has been on 2.5 to 3 L home oxygen since March. For the past 2 or 3 days he has been increasingly more confused. Per grandson, patient's  breathing has improved significantly after thoracentesis. Patient lives with his family and grandson has not noticed any hematemesis, hematochezia, melena, or hematuria.     Type and screen performed and 2 unit PRBC ordered.  Patient also received albuterol neb.  TRH consulted for further evaluation and management of acute hypoxic respite failure secondary to pleural effusion and symptomatic anemia.  Hospital course:  Acute on chronic hypoxic respiratory failure (2.5-3L [place] baseline) Large right pleural effusion Community acquired pneumonia Acute on chronic diastolic congestive heart failure Patient presenting to ED with progressive shortness of breath, confusion.  Patient is afebrile without leukocytosis.  COVID-19, influenza A/RSV PCR negative.  Chest x-ray with near opacification right hemithorax concerning for large right pleural effusion.  Patient underwent IR thoracentesis on 1/5 with removal of 1.8 L of fluid.  Patient was started on antibiotics with azithromycin and ceftriaxone and will continue azithromycin and cefdinir on discharge complete antibiotic course.  Patient was also started on IV furosemide will transition to furosemide 20 mg p.o. daily on discharge.  Patient's oxygen needs were titrated down to to 1.5 L per nasal cannula on discharge.  Overall prognosis is poor given his lymphoma that is not treated and anticipate need of transitioning to hospice in the near future.  Discharging home on outpatient palliative care with home health therapy.   Permanent atrial fibrillation with RVR Essential hypertension Likely precipitated by large pleural effusion versus pneumonia.  Improved following thoracentesis.  Continue diltiazem, valsartan and Xarelto.   Acute metabolic encephalopathy: Resolved Likely secondary to hypoxia and infection.  Family reported increased confusion over the past 2-3 days.  CT head without contrast with no acute intracranial findings.  Ammonia level within normal limits.  Did receive IV Ativan in the ED likely contributing factor.  Mental status now improved and back to baseline.   Marginal zone lymphoma/MALT lymphoma Last seen by  medical oncology, Dr. Irene Limbo outpatient 08/2021.  Repeat imaging CT chest/abdomen/pelvis November 2022 with right upper lobe peripheral pulmonary mass along pleura slightly larger with mass being hypermetabolic on comparison PET/CT dated 02/2021 and left lower lobe subsolid nodule increased in size.  Oncology did discuss with patient and his family regarding further evaluation with bone marrow biopsy and oncology thought at that time that the patient would not be able to tolerate any aggressive intervention given his age and medical comorbidities.  Patient has also reportedly told family that he does not wish to pursue any further workup or treatment for his cancer.  Palliative care was consulted during the hospitalization.  Will discharge with outpatient palliative care to follow-up, recommend close follow-up with PCP and medical oncology.  Anticipate need of transitioning to hospice in the near future as this is a terminal illness.  Continue goals of care discussions outpatient.   Chronic pancytopenia Symptomatic anemia Given his underlying lymphoma, concern for bone marrow infiltration.  WBC count 1.5 with absolute lymphocyte count 0.2, hemoglobin 5.4 with MCV 124.2.  Platelet count 174.  No signs or symptoms of bleeding, light brown stool on rectal exam and FOBT negative.  Patient had declined bone marrow biopsy in the past.  Anemia panel with iron 92, TIBC 132, ferritin 43, folate 19.3, vitamin B12 1448.  Transfused 3 unit PRBCs during hospitalization with improvement of hemoglobin to 7.6.  Given his underlying lymphoma with likely bone marrow infiltration suspect that he will have difficulty maintaining hemoglobin greater than 7.0.  Recommend repeat CBC 1 week and transfuse as needed.  But ultimately hospice would be highly  considered given his terminal illness.  CKD stage IIIa Baseline creatinine 1.2-1.4.  Creatinine 1.26 at time of discharge.  Started on furosemide as above.  Recommend repeat BMP 1  week.   Type 2 diabetes mellitus Diet controlled, hemoglobin A1c 5.03 January 2022.   Hyperlipidemia Continue atorvastatin   BPH Continue finasteride 5 mg p.o. nightly,  Alfuzosin 10 mg p.o. nightly  Discharge Diagnoses:  Principal Problem:   Acute hypoxemic respiratory failure (HCC) Active Problems:   CAP (community acquired pneumonia)   DM (diabetes mellitus), type 2 ([place])   Essential hypertension   BPH (benign prostatic hyperplasia)   Hypercholesterolemia   Marginal zone lymphoma (HCC)   Acute on chronic diastolic CHF (congestive heart failure) (HCC)   Pleural effusion   Atrial fibrillation with rapid ventricular response (HCC)   Acute metabolic encephalopathy    Discharge Instructions  Discharge Instructions     Call MD for:  difficulty breathing, headache or visual disturbances   Complete by: As directed    Call MD for:  extreme fatigue   Complete by: As directed    Call MD for:  persistant dizziness or light-headedness   Complete by: As directed    Call MD for:  persistant nausea and vomiting   Complete by: As directed    Call MD for:  severe uncontrolled pain   Complete by: As directed    Call MD for:  temperature >100.4   Complete by: As directed    Diet - low sodium heart healthy   Complete by: As directed    Increase activity slowly   Complete by: As directed       Allergies as of 11/09/2022       Reactions   Hydrocodone-acetaminophen Other (See Comments)   Suspected cause of confusion   Losartan Potassium-hctz Nausea Only   Levaquin [levofloxacin In D5w] Nausea Only   Penicillins Hives, Other (See Comments)   Has patient had a PCN reaction causing immediate rash, facial/tongue/throat swelling, SOB or lightheadedness with hypotension: unknown, childhood allergy Has patient had a PCN reaction causing severe rash involving mucus membranes or skin necrosis: No Has patient had a PCN reaction that required hospitalization No Has patient had a PCN reaction  occurring within the last 10 years: No If all of the above answers are "NO", then may proceed with Cephalosporin use.        Medication List     TAKE these medications    alfuzosin 10 MG 24 hr tablet Commonly known as: UROXATRAL Take 10 mg by mouth at bedtime.   atorvastatin 20 MG tablet Commonly known as: LIPITOR TAKE 1 TABLET BY MOUTH EVERY DAY   azithromycin 500 MG tablet Commonly known as: Zithromax Take 1 tablet (500 mg total) by mouth daily for 3 days. Take 1 tablet daily for 3 days.   cefdinir 300 MG capsule Commonly known as: OMNICEF Take 1 capsule (300 mg total) by mouth 2 (two) times daily for 5 days.   diltiazem 120 MG 24 hr capsule Commonly known as: CARDIZEM CD Take 1 capsule (120 mg total) by mouth daily.   finasteride 5 MG tablet Commonly known as: PROSCAR Take 5 mg by mouth at bedtime.   Fish Oil 1200 MG Caps Take 1,200 mg by mouth 3 (three) times a week.   fluticasone 50 MCG/ACT nasal spray Commonly known as: FLONASE Place 2 sprays into both nostrils daily. What changed:  when to take this reasons to take this   furosemide 20 MG tablet  Commonly known as: Lasix Take 1 tablet (20 mg total) by mouth daily.   guaiFENesin-dextromethorphan 100-10 MG/5ML syrup Commonly known as: ROBITUSSIN DM Take 15 mLs by mouth every 4 (four) hours as needed for cough.   hydrocortisone 25 MG suppository Commonly known as: ANUSOL-HC Place 1 suppository (25 mg total) rectally 2 (two) times daily. What changed:  when to take this reasons to take this   Iron 28 MG Tabs Take 28 mg by mouth 3 (three) times a week.   loratadine 10 MG tablet Commonly known as: CLARITIN Take 1 tablet (10 mg total) by mouth daily.   OVER THE COUNTER MEDICATION Take 2 tablets by mouth daily. [place] Immune enhancement   OVER THE COUNTER MEDICATION Take 20 mLs by mouth daily. Colloidal Silver liquid for Immune booster   Polyethyl Glycol-Propyl Glycol 0.4-0.3 % Soln Place 1  drop into both eyes 2 (two) times daily as needed (for dry/irritated eyes.).   SUPER C COMPLEX PO Take 1 capsule by mouth every evening. Super C 1000   traMADol 50 MG tablet Commonly known as: ULTRAM Take 1 tablet (50 mg total) by mouth every 6 (six) hours as needed (pain). What changed:  how much to take reasons to take this   valsartan 80 MG tablet Commonly known as: DIOVAN Take 80 mg by mouth daily.   Vitamin B-12 5000 MCG Subl Place 5,000 mcg under the tongue every evening.   Vitamin D 125 MCG (5000 UT) Caps Take 5,000 Units by mouth daily.   Xarelto 20 MG Tabs tablet Generic drug: rivaroxaban TAKE ONE TABLET BY MOUTH EVERYDAY AT BEDTIME What changed: See the new instructions.               Durable Medical Equipment  (From admission, onward)           Start     Ordered   11/09/22 0856  For home use only DME standard manual wheelchair with seat cushion  Once       Comments: Patient suffers from CHF/pneumonia which impairs their ability to perform daily activities like bathing, dressing, grooming, and toileting in the home.  A walker will not resolve issue with performing activities of daily living. A wheelchair will allow patient to safely perform daily activities. Patient can safely propel the wheelchair in the home or has a caregiver who can provide assistance. Length of need 6 months . Accessories: elevating leg rests (ELRs), wheel locks, extensions and anti-tippers.   11/09/22 0856            Follow-up Information     Lois Huxley, PA. Schedule an appointment as soon as possible for a visit in 1 week(s).   Specialty: Family Medicine Contact information: [place] [address] [phone]         Brunetta Genera, MD. Schedule an appointment as soon as possible for a visit.   Specialties: Hematology, Oncology Contact information: [place] [address] [phone]                 Allergies  Allergen Reactions   Hydrocodone-Acetaminophen Other (See Comments)    Suspected cause of confusion   Losartan Potassium-Hctz Nausea Only   Levaquin [Levofloxacin In D5w] Nausea Only   Penicillins Hives and Other (See Comments)    Has patient had a PCN reaction causing immediate rash, facial/tongue/throat swelling, SOB or lightheadedness with hypotension: unknown, childhood allergy Has patient had a PCN reaction causing severe  rash involving mucus membranes or skin necrosis: No Has patient had a PCN reaction that required hospitalization No Has patient had a PCN reaction occurring within the last 10 years: No If all of the above answers are "NO", then may proceed with Cephalosporin use.     Consultations: Palliative care   Procedures/Studies: ECHOCARDIOGRAM COMPLETE  Result Date: 11/08/2022    ECHOCARDIOGRAM REPORT   Patient Name:   James Sweeney Date of Exam: 11/08/2022 Medical Rec #:  [id number]       Height:       70.0 in Accession #:    [id number]      Weight:       186.3 lb Date of Birth:  November 28, 1940       BSA:          2.025 m Patient Age:    89 years        BP:           133/60 mmHg Patient Gender: M               HR:           81 bpm. Exam Location:  Inpatient Procedure: 2D Echo Indications:    CHF  History:        Patient has prior history of Echocardiogram examinations, most                 recent 01/06/2022. CHF, Arrythmias:Atrial Fibrillation; Risk                 Factors:Hypertension and Diabetes.  Sonographer:    Harvie Junior Referring Phys: [id number] [place]  1. Left ventricular ejection fraction, by estimation, is 50 to 55%. The left ventricle has low normal function. The left ventricle has no regional wall motion abnormalities. Left ventricular diastolic function could not be evaluated.  2. Right ventricular systolic function is normal. The right ventricular size is normal. There is normal pulmonary artery systolic pressure. The estimated right  ventricular systolic pressure is 10.1 mmHg.  3. Left atrial size was mildly dilated.  4. Right atrial size was mildly dilated.  5. A small pericardial effusion is present. The pericardial effusion is localized near the right atrium. Trivial pericardial effusion is noted near posterior wall of left ventricle.  6. The mitral valve is grossly normal. Trivial mitral valve regurgitation. No evidence of mitral stenosis.  7. The aortic valve is tricuspid. There is mild calcification of the aortic valve. Aortic valve regurgitation is not visualized. No aortic stenosis is present.  8. The inferior vena cava is dilated in size with <50% respiratory variability, suggesting right atrial pressure of 15 mmHg. Comparison(s): Changes from prior study are noted. Low normal LVEF now. FINDINGS  Left Ventricle: Left ventricular ejection fraction, by estimation, is 50 to 55%. The left ventricle has low normal function. The left ventricle has no regional wall motion abnormalities. The left ventricular internal cavity size was normal in size. There is borderline left ventricular hypertrophy. Left ventricular diastolic function could not be evaluated due to atrial fibrillation. Left ventricular diastolic function could not be evaluated. Right Ventricle: The right ventricular size is normal. No increase in right ventricular wall thickness. Right ventricular systolic function is normal. There is normal pulmonary artery systolic pressure. The tricuspid regurgitant velocity is 1.74 m/s, and  with an assumed right atrial pressure of 15 mmHg, the estimated right ventricular systolic pressure is 75.1 mmHg. Left Atrium: Left atrial size was mildly dilated. Right Atrium: Right  atrial size was mildly dilated. Pericardium: A small pericardial effusion is present. The pericardial effusion is localized near the right atrium. Mitral Valve: The mitral valve is grossly normal. There is mild calcification of the anterior mitral valve leaflet(s). Mild  mitral annular calcification. Trivial mitral valve regurgitation. No evidence of mitral valve stenosis. Tricuspid Valve: The tricuspid valve is normal in structure. Tricuspid valve regurgitation is not demonstrated. No evidence of tricuspid stenosis. Aortic Valve: The aortic valve is tricuspid. There is mild calcification of the aortic valve. There is mild aortic valve annular calcification. Aortic valve regurgitation is not visualized. No aortic stenosis is present. Aortic valve mean gradient measures 3.0 mmHg. Aortic valve peak gradient measures 5.4 mmHg. Aortic valve area, by VTI measures 2.86 cm. Pulmonic Valve: The pulmonic valve was normal in structure. Pulmonic valve regurgitation is mild. No evidence of pulmonic stenosis. Aorta: The aortic root and ascending aorta are structurally normal, with no evidence of dilitation. Venous: The inferior vena cava is dilated in size with less than 50% respiratory variability, suggesting right atrial pressure of 15 mmHg. IAS/Shunts: No atrial level shunt detected by color flow Doppler. Additional Comments: There is a small pleural effusion in the right lateral region.  LEFT VENTRICLE PLAX 2D LVIDd:         5.10 cm      Diastology LVIDs:         3.50 cm      LV e' medial:    11.60 cm/s LV PW:         1.10 cm      LV E/e' medial:  9.7 LV IVS:        1.10 cm      LV e' lateral:   11.11 cm/s LVOT diam:     2.10 cm      LV E/e' lateral: 10.1 LV SV:         67 LV SV Index:   33 LVOT Area:     3.46 cm  LV Volumes (MOD) LV vol d, MOD A2C: 101.0 ml LV vol d, MOD A4C: 99.0 ml LV vol s, MOD A2C: 53.4 ml LV vol s, MOD A4C: 29.9 ml LV SV MOD A2C:     47.6 ml LV SV MOD A4C:     99.0 ml LV SV MOD BP:      57.4 ml RIGHT VENTRICLE RV Basal diam:  3.70 cm RV Mid diam:    2.90 cm RV S prime:     11.47 cm/s TAPSE (M-mode): 1.7 cm LEFT ATRIUM             Index        RIGHT ATRIUM           Index LA diam:        3.60 cm 1.78 cm/m   RA Area:     19.60 cm LA Vol (A2C):   71.2 ml 35.13 ml/m   RA Volume:   54.10 ml  26.71 ml/m LA Vol (A4C):   73.3 ml 36.19 ml/m LA Biplane Vol: 72.5 ml 35.80 ml/m  AORTIC VALVE                    PULMONIC VALVE AV Area (Vmax):    2.98 cm     PV Vmax:          1.09 m/s AV Area (Vmean):   2.85 cm     PV Peak grad:     4.8 mmHg AV Area (VTI):  2.86 cm     PR End Diast Vel: 9.12 msec AV Vmax:           116.00 cm/s AV Vmean:          76.000 cm/s AV VTI:            0.234 m AV Peak Grad:      5.4 mmHg AV Mean Grad:      3.0 mmHg LVOT Vmax:         99.90 cm/s LVOT Vmean:        62.600 cm/s LVOT VTI:          0.193 m LVOT/AV VTI ratio: 0.82  AORTA Ao Root diam: 3.40 cm Ao Asc diam:  2.80 cm MITRAL VALVE                TRICUSPID VALVE MV Area (PHT): 4.49 cm     TR Peak grad:   12.1 mmHg MV Decel Time: 169 msec     TR Vmax:        174.00 cm/s MR Peak grad: 49.0 mmHg MR Vmax:      350.00 cm/s   SHUNTS MV E velocity: 112.00 cm/s  Systemic VTI:  0.19 m MV A velocity: 34.60 cm/s   Systemic Diam: 2.10 cm MV E/A ratio:  3.24 Vishnu Priya Mallipeddi Electronically signed by Lorelee Cover Mallipeddi Signature Date/Time: 11/08/2022/12:06:02 PM    Final    US THORACENTESIS ASP PLEURAL SPACE W/IMG GUIDE  Result Date: 11/08/2022 INDICATION: 82 year old male referred for thoracentesis EXAM: ULTRASOUND GUIDED RIGHT THORACENTESIS MEDICATIONS: None. COMPLICATIONS: None PROCEDURE: An ultrasound guided thoracentesis was thoroughly discussed with the patient and questions answered. The benefits, risks, alternatives and complications were also discussed. The patient understands and wishes to proceed with the procedure. Written consent was obtained. Ultrasound was performed to localize and mark an adequate pocket of fluid in the right chest. The area was then prepped and draped in the normal sterile fashion. 1% Lidocaine was used for local anesthesia. Under ultrasound guidance a 8 Fr Safe-T-Centesis catheter was introduced. Thoracentesis was performed. We elected to stop after 1.8 L of fluid  drainage secondary to patient's discomfort of cough and chest pain. The catheter was removed and a dressing applied. FINDINGS: A total of approximately 1.8 L of thin yellow fluid was removed. Samples were sent to the laboratory as requested by the clinical team. IMPRESSION: Status post ultrasound-guided right-sided thoracentesis, 1.8 L. Signed, Dulcy Fanny. Nadene Rubins, RPVI [facility] Electronically Signed   By: Corrie Mckusick D.O.   On: 11/08/2022 09:28   CT HEAD WO CONTRAST (5MM)  Result Date: 11/08/2022 CLINICAL DATA:  Delirium EXAM: CT HEAD WITHOUT CONTRAST TECHNIQUE: Contiguous axial images were obtained from the base of the skull through the vertex without intravenous contrast. RADIATION DOSE REDUCTION: This exam was performed according to the departmental dose-optimization program which includes automated exposure control, adjustment of the mA and/or kV according to patient size and/or use of iterative reconstruction technique. COMPARISON:  01/08/2022 FINDINGS: Brain: No evidence of acute infarction, hemorrhage, mass, mass effect, or midline shift. No hydrocephalus or extra-axial fluid collection. Periventricular white matter changes, likely the sequela of chronic small vessel ischemic disease. Age related cerebral atrophy. Vascular: No hyperdense vessel. Atherosclerotic calcifications in the intracranial carotid and vertebral arteries. Skull: Normal. Negative for fracture or focal lesion. Sinuses/Orbits: Clear paranasal sinuses. Status post bilateral lens replacements. Other: The mastoid air cells are well aerated. IMPRESSION: No acute intracranial  process. Electronically Signed   By: Merilyn Baba M.D.   On: 11/08/2022 01:26   DG CHEST PORT 1 VIEW  Result Date: 11/07/2022 CLINICAL DATA:  Status post right thoracentesis EXAM: PORTABLE CHEST 1 VIEW COMPARISON:  Previous studies including the examination done earlier today FINDINGS: Transverse  diameter of heart is slightly increased. Central pulmonary vessels are prominent. There is significant interval improvement in aeration in right hemithorax suggesting marked interval decrease in right pleural effusion after thoracentesis. There are increased markings in the left parahilar region. There is increased opacity in medial left lower lung field which may be due to pleural effusion and underlying infiltrate. There is blunting of both lateral CP angles. There is no pneumothorax. IMPRESSION: There is marked interval decrease in opacification of right hemithorax suggesting significant interval decrease in large right pleural effusion. There is no pneumothorax. Homogeneous opacity in right mid and right lower lung fields may suggest underlying atelectasis/pneumonia and possibly loculated effusion. Increased interstitial markings in the left parahilar region may suggest mild interstitial edema. Increased density in left lower lung fields may be due to pleural effusion and underlying atelectasis/pneumonia. Electronically Signed   By: Elmer Picker M.D.   On: 11/07/2022 21:17   DG Chest Port 1 View  Result Date: 11/07/2022 CLINICAL DATA:  Short of breath, history of lymphoma EXAM: PORTABLE CHEST 1 VIEW COMPARISON:  01/05/2022, 09/16/2021 FINDINGS: Single frontal view of the chest demonstrates near complete opacification of the right hemithorax, consistent with a combination of lung consolidation and pleural effusion. Vascular redistribution throughout the left lung. Otherwise the left chest is clear. The cardiac silhouette is partially obscured. No pneumothorax. No acute bony abnormality. IMPRESSION: 1. Near complete opacification of the right hemithorax, consistent with a combination of right lung consolidation and pleural effusion. In this patient with a history of prior lymphoma and abnormal chest CT findings, repeat CT chest with IV contrast may be useful if the patient would be a therapy candidate  should neoplasm be detected. Electronically Signed   By: Randa Ngo M.D.   On: 11/07/2022 18:32     Subjective: Patient seen examined bedside, resting currently.  Sitting in bedside chair.  Grandson present.  Patient states he was ready to go "yesterday".  Also states "thank you for the care but no plans to return to the hospital".  Patient states ready to go home as soon as possible.  No other questions or concerns at this time.  Grandson updated extensively at bedside.  Patient denies headache, no dizziness, no chest pain, no palpitations, no shortness of breath, no abdominal pain.  No acute events overnight per nursing staff.  Discharge Exam: Vitals:   11/09/22 0400 11/09/22 0800  BP: 120/68 (!) 140/83  Pulse: 72 82  Resp: 11 (!) 22  Temp: 98.3 F (36.8 C)   SpO2: 99% 94%   Vitals:   11/08/22 2300 11/09/22 0000 11/09/22 0400 11/09/22 0800  BP:  130/61 120/68 (!) 140/83  Pulse: 78 82 72 82  Resp: (!) '27 19 11 '$ (!) 22  Temp:   98.3 F (36.8 C)   TempSrc:  Oral Oral Oral  SpO2: 97% 92% 99% 94%  Weight:      Height:        Physical Exam: GEN: NAD, alert and oriented x 3, hard of hearing, chronically ill in appearance, appears older than stated age HEENT: NCAT, PERRL, EOMI, sclera clear, MMM PULM: CTAB w/o wheezes/crackles, normal respiratory effort, 1.5 L nasal cannula with SpO2 99% at  rest CV: RRR w/o M/G/R GI: abd soft, NTND, NABS, no R/G/M MSK: no peripheral edema, muscle strength globally intact 5/5 bilateral upper/lower extremities NEURO: CN II-XII intact, no focal deficits, sensation to light touch intact PSYCH: normal mood/affect Integumentary: dry/intact, no rashes or wounds    The results of significant diagnostics from this hospitalization (including imaging, microbiology, ancillary and laboratory) are listed below for reference.     Microbiology: Recent Results (from the past 240 hour(s))  Resp panel by RT-PCR (RSV, Flu A&B, Covid) Anterior Nasal Swab      Status: None   Collection Time: 11/07/22  6:03 PM   Specimen: Anterior Nasal Swab  Result Value Ref Range Status   SARS Coronavirus 2 by RT PCR NEGATIVE NEGATIVE Final    Comment: (NOTE) SARS-CoV-2 target nucleic acids are NOT DETECTED.  The SARS-CoV-2 RNA is generally detectable in upper respiratory specimens during the acute phase of infection. The lowest concentration of SARS-CoV-2 viral copies this assay can detect is 138 copies/mL. A negative result does not preclude SARS-Cov-2 infection and should not be used as the sole basis for treatment or other patient management decisions. A negative result may occur with  improper specimen collection/handling, submission of specimen other than nasopharyngeal swab, presence of viral mutation(s) within the areas targeted by this assay, and inadequate number of viral copies(<138 copies/mL). A negative result must be combined with clinical observations, patient history, and epidemiological information. The expected result is Negative.  Fact Sheet for Patients:  EntrepreneurPulse.com.au  Fact Sheet for Healthcare Providers:  IncredibleEmployment.be  This test is no t yet approved or cleared by the [place] FDA and  has been authorized for detection and/or diagnosis of SARS-CoV-2 by FDA under an Emergency Use Authorization (EUA). This EUA will remain  in effect (meaning this test can be used) for the duration of the COVID-19 declaration under Section 564(b)(1) of the Act, 21 U.S.C.section 360bbb-3(b)(1), unless the authorization is terminated  or revoked sooner.       Influenza A by PCR NEGATIVE NEGATIVE Final   Influenza B by PCR NEGATIVE NEGATIVE Final    Comment: (NOTE) The Xpert Xpress SARS-CoV-2/FLU/RSV plus assay is intended as an aid in the diagnosis of influenza from Nasopharyngeal swab specimens and should not be used as a sole basis for treatment. Nasal washings and aspirates are  unacceptable for Xpert Xpress SARS-CoV-2/FLU/RSV testing.  Fact Sheet for Patients: EntrepreneurPulse.com.au  Fact Sheet for Healthcare Providers: IncredibleEmployment.be  This test is not yet approved or cleared by the [place] FDA and has been authorized for detection and/or diagnosis of SARS-CoV-2 by FDA under an Emergency Use Authorization (EUA). This EUA will remain in effect (meaning this test can be used) for the duration of the COVID-19 declaration under Section 564(b)(1) of the Act, 21 U.S.C. section 360bbb-3(b)(1), unless the authorization is terminated or revoked.     Resp Syncytial Virus by PCR NEGATIVE NEGATIVE Final    Comment: (NOTE) Fact Sheet for Patients: EntrepreneurPulse.com.au  Fact Sheet for Healthcare Providers: IncredibleEmployment.be  This test is not yet approved or cleared by the [place] FDA and has been authorized for detection and/or diagnosis of SARS-CoV-2 by FDA under an Emergency Use Authorization (EUA). This EUA will remain in effect (meaning this test can be used) for the duration of the COVID-19 declaration under Section 564(b)(1) of the Act, 21 U.S.C. section 360bbb-3(b)(1), unless the authorization is terminated or revoked.  Performed at [facility], [place] [address] [place], [place] [address]  Body fluid culture w Gram Stain     Status: None (Preliminary result)   Collection Time: 11/07/22  9:38 PM   Specimen: Pleura; Body Fluid  Result Value Ref Range Status   Specimen Description PLEURAL FLUID  Final   Special Requests RIGHT  Final   Gram Stain   Final    RARE WBC PRESENT, PREDOMINANTLY MONONUCLEAR NO ORGANISMS SEEN Performed at [facility], [address] [place], [place] [address]    Culture PENDING  Incomplete   Report Status PENDING  Incomplete     Labs: BNP (last 3 results) Recent Labs    01/05/22 1715 11/07/22 1803  BNP  216.0* 709.6*   Basic Metabolic Panel: Recent Labs  Lab 11/07/22 1803 11/08/22 1030 11/09/22 0332  NA 136 136 136  K 4.6 4.0 3.7  CL 102 101 98  CO2 '23 27 27  '$ GLUCOSE 180* 121* 97  BUN '19 21 21  '$ CREATININE 1.41* 1.37* 1.26*  CALCIUM 8.5* 7.8* 7.9*   Liver Function Tests: Recent Labs  Lab 11/07/22 1803  AST 26  ALT 27  ALKPHOS 129*  BILITOT 1.7*  PROT 6.6  ALBUMIN 3.1*   No results for input(s): "LIPASE", "AMYLASE" in the last 168 hours. Recent Labs  Lab 11/08/22 1030  AMMONIA 12   CBC: Recent Labs  Lab 11/07/22 1803 11/08/22 1030 11/08/22 1933 11/09/22 0332  WBC 1.5* 1.6*  --  1.5*  NEUTROABS 1.3*  --   --   --   HGB 5.4* 5.9* 7.9* 7.6*  HCT 16.4* 17.5* 22.8* 21.8*  MCV 124.2* 106.1*  --  102.8*  PLT 174 163  --  144*   Cardiac Enzymes: No results for input(s): "CKTOTAL", "CKMB", "CKMBINDEX", "TROPONINI" in the last 168 hours. BNP: Invalid input(s): "POCBNP" CBG: Recent Labs  Lab 11/08/22 1139 11/08/22 1556 11/08/22 2056 11/08/22 2139 11/09/22 0809  GLUCAP 126* 123* 112* 114* 99   D-Dimer No results for input(s): "DDIMER" in the last 72 hours. Hgb A1c No results for input(s): "HGBA1C" in the last 72 hours. Lipid Profile No results for input(s): "CHOL", "HDL", "LDLCALC", "TRIG", "CHOLHDL", "LDLDIRECT" in the last 72 hours. Thyroid function studies No results for input(s): "TSH", "T4TOTAL", "T3FREE", "THYROIDAB" in the last 72 hours.  Invalid input(s): "FREET3" Anemia work up Recent Labs    11/08/22 1030 11/08/22 1816  VITAMINB12 1,448*  --   FOLATE 19.3  --   FERRITIN 483*  --   TIBC 132*  --   IRON 92  --   RETICCTPCT  --  1.7   Urinalysis    Component Value Date/Time   COLORURINE YELLOW 01/06/2022 0005   APPEARANCEUR HAZY (A) 01/06/2022 0005   LABSPEC 1.015 01/06/2022 0005   PHURINE 5.0 01/06/2022 0005   GLUCOSEU NEGATIVE 01/06/2022 0005   HGBUR NEGATIVE 01/06/2022 0005   BILIRUBINUR NEGATIVE 01/06/2022 0005   KETONESUR  NEGATIVE 01/06/2022 0005   PROTEINUR 30 (A) 01/06/2022 0005   UROBILINOGEN 0.2 09/10/2010 1141   NITRITE NEGATIVE 01/06/2022 0005   LEUKOCYTESUR NEGATIVE 01/06/2022 0005   Sepsis Labs Recent Labs  Lab 11/07/22 1803 11/08/22 1030 11/09/22 0332  WBC 1.5* 1.6* 1.5*   Microbiology Recent Results (from the past 240 hour(s))  Resp panel by RT-PCR (RSV, Flu A&B, Covid) Anterior Nasal Swab     Status: None   Collection Time: 11/07/22  6:03 PM   Specimen: Anterior Nasal Swab  Result Value Ref Range Status   SARS Coronavirus 2 by RT PCR NEGATIVE NEGATIVE  Final    Comment: (NOTE) SARS-CoV-2 target nucleic acids are NOT DETECTED.  The SARS-CoV-2 RNA is generally detectable in upper respiratory specimens during the acute phase of infection. The lowest concentration of SARS-CoV-2 viral copies this assay can detect is 138 copies/mL. A negative result does not preclude SARS-Cov-2 infection and should not be used as the sole basis for treatment or other patient management decisions. A negative result may occur with  improper specimen collection/handling, submission of specimen other than nasopharyngeal swab, presence of viral mutation(s) within the areas targeted by this assay, and inadequate number of viral copies(<138 copies/mL). A negative result must be combined with clinical observations, patient history, and epidemiological information. The expected result is Negative.  Fact Sheet for Patients:  EntrepreneurPulse.com.au  Fact Sheet for Healthcare Providers:  IncredibleEmployment.be  This test is no t yet approved or cleared by the [place] FDA and  has been authorized for detection and/or diagnosis of SARS-CoV-2 by FDA under an Emergency Use Authorization (EUA). This EUA will remain  in effect (meaning this test can be used) for the duration of the COVID-19 declaration under Section 564(b)(1) of the Act, 21 U.S.C.section 360bbb-3(b)(1),  unless the authorization is terminated  or revoked sooner.       Influenza A by PCR NEGATIVE NEGATIVE Final   Influenza B by PCR NEGATIVE NEGATIVE Final    Comment: (NOTE) The Xpert Xpress SARS-CoV-2/FLU/RSV plus assay is intended as an aid in the diagnosis of influenza from Nasopharyngeal swab specimens and should not be used as a sole basis for treatment. Nasal washings and aspirates are unacceptable for Xpert Xpress SARS-CoV-2/FLU/RSV testing.  Fact Sheet for Patients: EntrepreneurPulse.com.au  Fact Sheet for Healthcare Providers: IncredibleEmployment.be  This test is not yet approved or cleared by the [place] FDA and has been authorized for detection and/or diagnosis of SARS-CoV-2 by FDA under an Emergency Use Authorization (EUA). This EUA will remain in effect (meaning this test can be used) for the duration of the COVID-19 declaration under Section 564(b)(1) of the Act, 21 U.S.C. section 360bbb-3(b)(1), unless the authorization is terminated or revoked.     Resp Syncytial Virus by PCR NEGATIVE NEGATIVE Final    Comment: (NOTE) Fact Sheet for Patients: EntrepreneurPulse.com.au  Fact Sheet for Healthcare Providers: IncredibleEmployment.be  This test is not yet approved or cleared by the [place] FDA and has been authorized for detection and/or diagnosis of SARS-CoV-2 by FDA under an Emergency Use Authorization (EUA). This EUA will remain in effect (meaning this test can be used) for the duration of the COVID-19 declaration under Section 564(b)(1) of the Act, 21 U.S.C. section 360bbb-3(b)(1), unless the authorization is terminated or revoked.  Performed at [facility], [place] [address], [place], [place] [address]   Body fluid culture w Gram Stain     Status: None (Preliminary result)   Collection Time: 11/07/22  9:38 PM   Specimen: Pleura; Body Fluid  Result Value Ref Range  Status   Specimen Description PLEURAL FLUID  Final   Special Requests RIGHT  Final   Gram Stain   Final    RARE WBC PRESENT, PREDOMINANTLY MONONUCLEAR NO ORGANISMS SEEN Performed at [facility], [address], [place], [place] [address]    Culture PENDING  Incomplete   Report Status PENDING  Incomplete     Time coordinating discharge: Over 30 minutes  SIGNED:   Tyshon Fanning J [place] DO  [facility] 11/09/2022, 9:45 AM

## 2022-11-09 NOTE — Plan of Care (Signed)

## 2022-11-09 NOTE — Evaluation (Signed)
Physical Therapy Evaluation Patient Details Name: James Sweeney MRN: [id number] DOB: 18-Mar-1941 Today's Date: 11/09/2022  History of Present Illness  82 y.o. male presented to the ED 11/07/22 via EMS with shortness of breath. 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 PTA patient had recent 3 week decline in functional mobility (requiring min assist for mobility and inability to walk to bathroom with grandson placing pt on his rollator and rolling into bathroom).  Pt currently requires min assist for OOB mobility. He has a ramp to enter home, but currently is not strong enough to walk up ramp and will benefit from wheelchair to access home.  Anticipate patient will benefit from PT to address problems listed below.Will continue to follow acutely to maximize functional mobility independence and safety.      Patient suffers from acute CHF and pneumonia which impairs their ability to perform daily activities like ambulation in the home.  A walker alone will not resolve the issues with performing activities of daily living. A wheelchair will allow patient to safely perform daily activities and safely access home.  The patient can self propel in the home or has a caregiver who can provide assistance.         Recommendations for follow up therapy are one component of a multi-disciplinary discharge planning process, led by the attending physician.  Recommendations may be updated based on patient status, additional functional criteria and insurance authorization.  Follow Up  Recommendations Home health PT (discussed possible SNF and grandson reports he will refuse)      Assistance Recommended at Discharge Frequent or constant Supervision/Assistance  Patient can return home with the following  A little help with walking and/or transfers;Assistance with cooking/housework;Direct supervision/assist for medications management;Direct supervision/assist for financial management;Assist for transportation;Help with stairs or ramp for entrance    Equipment Recommendations Wheelchair (measurements PT);Wheelchair cushion (measurements PT)  Recommendations for Other Services       Functional Status Assessment Patient has had a recent decline in their functional status and demonstrates the ability to make significant improvements in function in a reasonable and predictable amount of time.     Precautions / Restrictions Precautions Precautions: Fall      Mobility  Bed Mobility Overal bed mobility: Needs Assistance Bed Mobility: Rolling, Sidelying to Sit Rolling: Min assist Sidelying to sit: Min assist       General bed mobility comments: grandson reports he has recently been having to help him; he lets pt pull up on his hand/arm    Transfers Overall transfer level: Needs assistance Equipment used: Rolling walker (2 wheels) Transfers: Sit to/from Stand, Bed to chair/wheelchair/BSC Sit to Stand: Min assist   Step pivot transfers: Min guard       General transfer comment: pt used to pulling up on rollator; RW anchored by grandson and pt pulled to stand without other physical assist; step-pivoted with RW    Ambulation/Gait               General Gait Details: deferred as pt sleepy/groggy and does not walk much at home  [employer]  Mobility    Modified Rankin (Stroke Patients Only)       Balance Overall balance assessment: Mild deficits observed, not formally tested                                            Pertinent Vitals/Pain Pain Assessment Pain Assessment: No/denies pain    Home Living Family/patient expects to be discharged to:: Private residence Living Arrangements: Spouse/significant other;Children Available Help at Discharge: Family;Available 24 hours/day Type of Home: House Home Access: Stairs to enter;Ramped entrance   Entrance Stairs-Number of Steps: 1 flight   Home Layout: One level Home Equipment: Rollator (4 wheels);BSC/3in1      Prior Function Prior Level of Function : Independent/Modified Independent             Mobility Comments: until 3 weeks walking with rollator; recently more bed to bathroom or bed to chair with grandson sitting him on his rollator and rolling him into bathroom ADLs Comments: bathes himself until 3 weeks ago (now needs help with bathing and dressing)     Hand Dominance   Dominant Hand: Right    Extremity/Trunk Assessment   Upper Extremity Assessment Upper Extremity Assessment: Generalized weakness    Lower Extremity Assessment Lower Extremity Assessment: Generalized weakness    Cervical / Trunk Assessment Cervical / Trunk Assessment: Kyphotic  Communication   Communication: HOH  Cognition Arousal/Alertness: Awake/alert Behavior During Therapy: Flat affect Overall Cognitive Status: Difficult to assess                                 General Comments: very HOH; grandson helps communicated as he can hear him better        General Comments General comments (skin integrity, edema, etc.): Grandson present and provided home set-up and prior status information; sats >90% on 1.5L    Exercises     Assessment/Plan    PT Assessment Patient needs continued PT services  PT Problem List Decreased strength;Decreased activity tolerance;Decreased balance;Decreased mobility;Decreased cognition;Decreased knowledge of use of DME;Decreased safety awareness;Decreased knowledge of precautions;Cardiopulmonary status  limiting activity       PT Treatment Interventions DME instruction;Gait training;Functional mobility training;Therapeutic activities;Therapeutic exercise;Balance training;Cognitive remediation;Patient/family education    PT Goals (Current goals can be found in the Care Plan section)  Acute Rehab PT Goals Patient Stated Goal: wants pt to go home with maximum [facility] services PT Goal Formulation: With patient/family Time For Goal Achievement: 11/23/22 Potential to Achieve Goals: Good    Frequency Min 3X/week     Co-evaluation               AM-PAC PT "6 Clicks" Mobility  Outcome Measure Help needed turning from your back to your side while in a flat bed without using bedrails?: A Little Help needed moving from lying on your back to sitting on the side of a flat bed without using bedrails?: A Little Help needed moving to and from a bed to a chair (including a wheelchair)?: A Little Help needed standing up from a chair using your arms (e.g., wheelchair or bedside chair)?: A Little Help needed to walk in hospital room?: Total Help needed climbing 3-5 steps with a railing? : Total 6 Click Score: 14    End of Session Equipment Utilized During Treatment: Gait belt Activity Tolerance: Patient tolerated treatment well  Patient left: in chair;with call bell/phone within reach;with chair alarm set;with family/visitor present Nurse Communication: Mobility status;Other (comment) (waist alarm) PT Visit Diagnosis: Other abnormalities of gait and mobility (R26.89);Muscle weakness (generalized) (M62.81)    Time: 5726-2035 PT Time Calculation (min) (ACUTE ONLY): 30 min   Charges:   PT Evaluation $PT Eval Low Complexity: 1 Low PT Treatments $Gait Training: 8-22 mins         [place]  Office [phone]   Rexanne Mano 11/09/2022, 8:51 AM

## 2022-11-09 NOTE — Plan of Care (Signed)
  Problem: Education: Goal: Ability to demonstrate management of disease process will improve 11/09/2022 0126 by Zadie Rhine, RN Outcome: Progressing 11/09/2022 0104 by Zadie Rhine, RN Outcome: Progressing Goal: Ability to verbalize understanding of medication therapies will improve Outcome: Progressing Goal: Individualized Educational Video(s) Outcome: Progressing

## 2022-11-09 NOTE — TOC Transition Note (Signed)
Transition of Care [facility]) - CM/SW Discharge Note   Patient Details  Name: James Sweeney MRN: [id number] Date of Birth: 1941/04/27  [facility] of [facility]) CM/SW Contact:  Carles Collet, RN Phone Number: 11/09/2022, 10:19 AM   Clinical Narrative:     Spoke to family re DC planning.  They would like to have [place] with [name] as hey are familiar with them from previous sue. Referral accepted by [facility]. Discussed OP palliative services, and referral made to HoP, through [place].  They would like to have WC del;ivered to the room. Ordered through [employer], family will transport [facility] home in their truck but cannot get patient in and out of truck therefore will need PTAR, and we discussed that I will set this up for around noon. Verified address. Patient has home oxygen prior to admission.   Final next level of care: Home w Home Health Services Barriers to Discharge: No Barriers Identified   Patient Goals and CMS Choice CMS Medicare.gov Compare Post Acute Care list provided to:: Other (Comment Required) Choice offered to / list presented to : Adult Children  Discharge Placement                         Discharge Plan and Services Additional resources added to the After Visit Summary for                  DME Arranged: Wheelchair manual DME Agency: [facility] [employer] Date DME Agency Contacted: 11/09/22 Time DME Agency Contacted: 46 Representative spoke with at DME Agency: Brenton Grills HH Arranged: RN, PT, Nurse's Aide, Social Work [employer] Agency: [place] Date [place]: 11/09/22 Time [place]: 1018 Representative spoke with at [place]: [place] ([place]) Interventions [place]: No Food Insecurity (11/09/2022)  Housing: Low Risk  (11/09/2022)  Transportation Needs: No Transportation Needs (11/09/2022)  Utilities: Not At Risk (11/09/2022)  Tobacco Use: Medium Risk (11/07/2022)     Readmission Risk  Interventions     No data to display

## 2022-11-09 NOTE — Progress Notes (Signed)
HGB resulted to be 7.9. Lab are ordered in am to follow up and verify again. SRP RN

## 2022-11-10 DIAGNOSIS — R011 Cardiac murmur, unspecified: Secondary | ICD-10-CM | POA: Diagnosis not present

## 2022-11-10 DIAGNOSIS — J189 Pneumonia, unspecified organism: Secondary | ICD-10-CM | POA: Diagnosis not present

## 2022-11-10 DIAGNOSIS — I5033 Acute on chronic diastolic (congestive) heart failure: Secondary | ICD-10-CM | POA: Diagnosis not present

## 2022-11-10 DIAGNOSIS — C884 Extranodal marginal zone B-cell lymphoma of mucosa-associated lymphoid tissue [MALT-lymphoma]: Secondary | ICD-10-CM | POA: Diagnosis not present

## 2022-11-10 DIAGNOSIS — E1122 Type 2 diabetes mellitus with diabetic chronic kidney disease: Secondary | ICD-10-CM | POA: Diagnosis not present

## 2022-11-10 DIAGNOSIS — N1831 Chronic kidney disease, stage 3a: Secondary | ICD-10-CM | POA: Diagnosis not present

## 2022-11-10 DIAGNOSIS — J9621 Acute and chronic respiratory failure with hypoxia: Secondary | ICD-10-CM | POA: Diagnosis not present

## 2022-11-10 DIAGNOSIS — I130 Hypertensive heart and chronic kidney disease with heart failure and stage 1 through stage 4 chronic kidney disease, or unspecified chronic kidney disease: Secondary | ICD-10-CM | POA: Diagnosis not present

## 2022-11-10 DIAGNOSIS — I4819 Other persistent atrial fibrillation: Secondary | ICD-10-CM | POA: Diagnosis not present

## 2022-11-10 LAB — GLUCOSE, BODY FLUID OTHER: Glucose, Body Fluid Other: 126 mg/dL

## 2022-11-11 DIAGNOSIS — I4819 Other persistent atrial fibrillation: Secondary | ICD-10-CM | POA: Diagnosis not present

## 2022-11-11 DIAGNOSIS — N1831 Chronic kidney disease, stage 3a: Secondary | ICD-10-CM | POA: Diagnosis not present

## 2022-11-11 DIAGNOSIS — I5033 Acute on chronic diastolic (congestive) heart failure: Secondary | ICD-10-CM | POA: Diagnosis not present

## 2022-11-11 DIAGNOSIS — J9621 Acute and chronic respiratory failure with hypoxia: Secondary | ICD-10-CM | POA: Diagnosis not present

## 2022-11-11 DIAGNOSIS — C884 Extranodal marginal zone B-cell lymphoma of mucosa-associated lymphoid tissue [MALT-lymphoma]: Secondary | ICD-10-CM | POA: Diagnosis not present

## 2022-11-11 DIAGNOSIS — R011 Cardiac murmur, unspecified: Secondary | ICD-10-CM | POA: Diagnosis not present

## 2022-11-11 DIAGNOSIS — E1122 Type 2 diabetes mellitus with diabetic chronic kidney disease: Secondary | ICD-10-CM | POA: Diagnosis not present

## 2022-11-11 DIAGNOSIS — J189 Pneumonia, unspecified organism: Secondary | ICD-10-CM | POA: Diagnosis not present

## 2022-11-11 DIAGNOSIS — I130 Hypertensive heart and chronic kidney disease with heart failure and stage 1 through stage 4 chronic kidney disease, or unspecified chronic kidney disease: Secondary | ICD-10-CM | POA: Diagnosis not present

## 2022-11-11 LAB — BODY FLUID CULTURE W GRAM STAIN: Culture: NO GROWTH

## 2022-11-12 LAB — CYTOLOGY - NON PAP

## 2022-11-12 LAB — SURGICAL PATHOLOGY

## 2022-11-12 LAB — PATHOLOGIST SMEAR REVIEW

## 2022-11-13 DIAGNOSIS — I130 Hypertensive heart and chronic kidney disease with heart failure and stage 1 through stage 4 chronic kidney disease, or unspecified chronic kidney disease: Secondary | ICD-10-CM | POA: Diagnosis not present

## 2022-11-13 DIAGNOSIS — E1122 Type 2 diabetes mellitus with diabetic chronic kidney disease: Secondary | ICD-10-CM | POA: Diagnosis not present

## 2022-11-13 DIAGNOSIS — N1831 Chronic kidney disease, stage 3a: Secondary | ICD-10-CM | POA: Diagnosis not present

## 2022-11-13 DIAGNOSIS — R011 Cardiac murmur, unspecified: Secondary | ICD-10-CM | POA: Diagnosis not present

## 2022-11-13 DIAGNOSIS — I4819 Other persistent atrial fibrillation: Secondary | ICD-10-CM | POA: Diagnosis not present

## 2022-11-13 DIAGNOSIS — C884 Extranodal marginal zone B-cell lymphoma of mucosa-associated lymphoid tissue [MALT-lymphoma]: Secondary | ICD-10-CM | POA: Diagnosis not present

## 2022-11-13 DIAGNOSIS — J9621 Acute and chronic respiratory failure with hypoxia: Secondary | ICD-10-CM | POA: Diagnosis not present

## 2022-11-13 DIAGNOSIS — J189 Pneumonia, unspecified organism: Secondary | ICD-10-CM | POA: Diagnosis not present

## 2022-11-13 DIAGNOSIS — I5033 Acute on chronic diastolic (congestive) heart failure: Secondary | ICD-10-CM | POA: Diagnosis not present

## 2022-11-13 LAB — CULTURE, BLOOD (ROUTINE X 2)
Culture: NO GROWTH
Culture: NO GROWTH
Special Requests: ADEQUATE
Special Requests: ADEQUATE

## 2022-11-14 DIAGNOSIS — I4819 Other persistent atrial fibrillation: Secondary | ICD-10-CM | POA: Diagnosis not present

## 2022-11-14 DIAGNOSIS — N1831 Chronic kidney disease, stage 3a: Secondary | ICD-10-CM | POA: Diagnosis not present

## 2022-11-14 DIAGNOSIS — C884 Extranodal marginal zone B-cell lymphoma of mucosa-associated lymphoid tissue [MALT-lymphoma]: Secondary | ICD-10-CM | POA: Diagnosis not present

## 2022-11-14 DIAGNOSIS — E1122 Type 2 diabetes mellitus with diabetic chronic kidney disease: Secondary | ICD-10-CM | POA: Diagnosis not present

## 2022-11-14 DIAGNOSIS — R011 Cardiac murmur, unspecified: Secondary | ICD-10-CM | POA: Diagnosis not present

## 2022-11-14 DIAGNOSIS — I130 Hypertensive heart and chronic kidney disease with heart failure and stage 1 through stage 4 chronic kidney disease, or unspecified chronic kidney disease: Secondary | ICD-10-CM | POA: Diagnosis not present

## 2022-11-14 DIAGNOSIS — I5033 Acute on chronic diastolic (congestive) heart failure: Secondary | ICD-10-CM | POA: Diagnosis not present

## 2022-11-14 DIAGNOSIS — J189 Pneumonia, unspecified organism: Secondary | ICD-10-CM | POA: Diagnosis not present

## 2022-11-14 DIAGNOSIS — J9621 Acute and chronic respiratory failure with hypoxia: Secondary | ICD-10-CM | POA: Diagnosis not present

## 2022-11-17 DIAGNOSIS — C884 Extranodal marginal zone B-cell lymphoma of mucosa-associated lymphoid tissue [MALT-lymphoma]: Secondary | ICD-10-CM | POA: Diagnosis not present

## 2022-11-17 DIAGNOSIS — N1831 Chronic kidney disease, stage 3a: Secondary | ICD-10-CM | POA: Diagnosis not present

## 2022-11-17 DIAGNOSIS — E1122 Type 2 diabetes mellitus with diabetic chronic kidney disease: Secondary | ICD-10-CM | POA: Diagnosis not present

## 2022-11-17 DIAGNOSIS — J189 Pneumonia, unspecified organism: Secondary | ICD-10-CM | POA: Diagnosis not present

## 2022-11-17 DIAGNOSIS — I130 Hypertensive heart and chronic kidney disease with heart failure and stage 1 through stage 4 chronic kidney disease, or unspecified chronic kidney disease: Secondary | ICD-10-CM | POA: Diagnosis not present

## 2022-11-17 DIAGNOSIS — J9621 Acute and chronic respiratory failure with hypoxia: Secondary | ICD-10-CM | POA: Diagnosis not present

## 2022-11-17 DIAGNOSIS — I5033 Acute on chronic diastolic (congestive) heart failure: Secondary | ICD-10-CM | POA: Diagnosis not present

## 2022-11-17 DIAGNOSIS — R011 Cardiac murmur, unspecified: Secondary | ICD-10-CM | POA: Diagnosis not present

## 2022-11-17 DIAGNOSIS — I4819 Other persistent atrial fibrillation: Secondary | ICD-10-CM | POA: Diagnosis not present

## 2022-11-18 DIAGNOSIS — J9621 Acute and chronic respiratory failure with hypoxia: Secondary | ICD-10-CM | POA: Diagnosis not present

## 2022-11-18 DIAGNOSIS — N1831 Chronic kidney disease, stage 3a: Secondary | ICD-10-CM | POA: Diagnosis not present

## 2022-11-18 DIAGNOSIS — E1122 Type 2 diabetes mellitus with diabetic chronic kidney disease: Secondary | ICD-10-CM | POA: Diagnosis not present

## 2022-11-18 DIAGNOSIS — I5033 Acute on chronic diastolic (congestive) heart failure: Secondary | ICD-10-CM | POA: Diagnosis not present

## 2022-11-18 DIAGNOSIS — R011 Cardiac murmur, unspecified: Secondary | ICD-10-CM | POA: Diagnosis not present

## 2022-11-18 DIAGNOSIS — J189 Pneumonia, unspecified organism: Secondary | ICD-10-CM | POA: Diagnosis not present

## 2022-11-18 DIAGNOSIS — C884 Extranodal marginal zone B-cell lymphoma of mucosa-associated lymphoid tissue [MALT-lymphoma]: Secondary | ICD-10-CM | POA: Diagnosis not present

## 2022-11-18 DIAGNOSIS — I4819 Other persistent atrial fibrillation: Secondary | ICD-10-CM | POA: Diagnosis not present

## 2022-11-18 DIAGNOSIS — I130 Hypertensive heart and chronic kidney disease with heart failure and stage 1 through stage 4 chronic kidney disease, or unspecified chronic kidney disease: Secondary | ICD-10-CM | POA: Diagnosis not present

## 2022-11-19 DIAGNOSIS — I10 Essential (primary) hypertension: Secondary | ICD-10-CM | POA: Diagnosis not present

## 2022-11-19 DIAGNOSIS — E1169 Type 2 diabetes mellitus with other specified complication: Secondary | ICD-10-CM | POA: Diagnosis not present

## 2022-11-19 DIAGNOSIS — G9341 Metabolic encephalopathy: Secondary | ICD-10-CM | POA: Diagnosis not present

## 2022-11-19 DIAGNOSIS — C8582 Other specified types of non-Hodgkin lymphoma, intrathoracic lymph nodes: Secondary | ICD-10-CM | POA: Diagnosis not present

## 2022-11-19 DIAGNOSIS — N1831 Chronic kidney disease, stage 3a: Secondary | ICD-10-CM | POA: Diagnosis not present

## 2022-11-19 DIAGNOSIS — J9621 Acute and chronic respiratory failure with hypoxia: Secondary | ICD-10-CM | POA: Diagnosis not present

## 2022-11-19 DIAGNOSIS — D649 Anemia, unspecified: Secondary | ICD-10-CM | POA: Diagnosis not present

## 2022-11-19 DIAGNOSIS — D61818 Other pancytopenia: Secondary | ICD-10-CM | POA: Diagnosis not present

## 2022-11-19 DIAGNOSIS — I4891 Unspecified atrial fibrillation: Secondary | ICD-10-CM | POA: Diagnosis not present

## 2022-11-20 ENCOUNTER — Telehealth: Payer: Self-pay | Admitting: Hematology

## 2022-11-20 DIAGNOSIS — N1831 Chronic kidney disease, stage 3a: Secondary | ICD-10-CM | POA: Diagnosis not present

## 2022-11-20 DIAGNOSIS — I5033 Acute on chronic diastolic (congestive) heart failure: Secondary | ICD-10-CM | POA: Diagnosis not present

## 2022-11-20 DIAGNOSIS — I4819 Other persistent atrial fibrillation: Secondary | ICD-10-CM | POA: Diagnosis not present

## 2022-11-20 DIAGNOSIS — R011 Cardiac murmur, unspecified: Secondary | ICD-10-CM | POA: Diagnosis not present

## 2022-11-20 DIAGNOSIS — I130 Hypertensive heart and chronic kidney disease with heart failure and stage 1 through stage 4 chronic kidney disease, or unspecified chronic kidney disease: Secondary | ICD-10-CM | POA: Diagnosis not present

## 2022-11-20 DIAGNOSIS — C884 Extranodal marginal zone B-cell lymphoma of mucosa-associated lymphoid tissue [MALT-lymphoma]: Secondary | ICD-10-CM | POA: Diagnosis not present

## 2022-11-20 DIAGNOSIS — E1122 Type 2 diabetes mellitus with diabetic chronic kidney disease: Secondary | ICD-10-CM | POA: Diagnosis not present

## 2022-11-20 DIAGNOSIS — J189 Pneumonia, unspecified organism: Secondary | ICD-10-CM | POA: Diagnosis not present

## 2022-11-20 DIAGNOSIS — J9621 Acute and chronic respiratory failure with hypoxia: Secondary | ICD-10-CM | POA: Diagnosis not present

## 2022-11-20 NOTE — Telephone Encounter (Signed)
Patient's grandson called to schedule f/u to go over scans. Patient scheduled and will be notified.

## 2022-11-21 DIAGNOSIS — I130 Hypertensive heart and chronic kidney disease with heart failure and stage 1 through stage 4 chronic kidney disease, or unspecified chronic kidney disease: Secondary | ICD-10-CM | POA: Diagnosis not present

## 2022-11-21 DIAGNOSIS — I5033 Acute on chronic diastolic (congestive) heart failure: Secondary | ICD-10-CM | POA: Diagnosis not present

## 2022-11-21 DIAGNOSIS — N1831 Chronic kidney disease, stage 3a: Secondary | ICD-10-CM | POA: Diagnosis not present

## 2022-11-21 DIAGNOSIS — I4819 Other persistent atrial fibrillation: Secondary | ICD-10-CM | POA: Diagnosis not present

## 2022-11-21 DIAGNOSIS — J9621 Acute and chronic respiratory failure with hypoxia: Secondary | ICD-10-CM | POA: Diagnosis not present

## 2022-11-21 DIAGNOSIS — E1122 Type 2 diabetes mellitus with diabetic chronic kidney disease: Secondary | ICD-10-CM | POA: Diagnosis not present

## 2022-11-21 DIAGNOSIS — R011 Cardiac murmur, unspecified: Secondary | ICD-10-CM | POA: Diagnosis not present

## 2022-11-21 DIAGNOSIS — J189 Pneumonia, unspecified organism: Secondary | ICD-10-CM | POA: Diagnosis not present

## 2022-11-21 DIAGNOSIS — C884 Extranodal marginal zone B-cell lymphoma of mucosa-associated lymphoid tissue [MALT-lymphoma]: Secondary | ICD-10-CM | POA: Diagnosis not present

## 2022-11-26 DIAGNOSIS — I5033 Acute on chronic diastolic (congestive) heart failure: Secondary | ICD-10-CM | POA: Diagnosis not present

## 2022-11-26 DIAGNOSIS — R011 Cardiac murmur, unspecified: Secondary | ICD-10-CM | POA: Diagnosis not present

## 2022-11-26 DIAGNOSIS — I4819 Other persistent atrial fibrillation: Secondary | ICD-10-CM | POA: Diagnosis not present

## 2022-11-26 DIAGNOSIS — J9621 Acute and chronic respiratory failure with hypoxia: Secondary | ICD-10-CM | POA: Diagnosis not present

## 2022-11-26 DIAGNOSIS — C884 Extranodal marginal zone B-cell lymphoma of mucosa-associated lymphoid tissue [MALT-lymphoma]: Secondary | ICD-10-CM | POA: Diagnosis not present

## 2022-11-26 DIAGNOSIS — J189 Pneumonia, unspecified organism: Secondary | ICD-10-CM | POA: Diagnosis not present

## 2022-11-26 DIAGNOSIS — I130 Hypertensive heart and chronic kidney disease with heart failure and stage 1 through stage 4 chronic kidney disease, or unspecified chronic kidney disease: Secondary | ICD-10-CM | POA: Diagnosis not present

## 2022-11-26 DIAGNOSIS — E1122 Type 2 diabetes mellitus with diabetic chronic kidney disease: Secondary | ICD-10-CM | POA: Diagnosis not present

## 2022-11-26 DIAGNOSIS — N1831 Chronic kidney disease, stage 3a: Secondary | ICD-10-CM | POA: Diagnosis not present

## 2022-11-28 ENCOUNTER — Telehealth: Payer: Self-pay | Admitting: Hematology

## 2022-11-28 NOTE — Telephone Encounter (Signed)
Called patient regarding inbasket message, rescheduled patient's appointments. Patient is notified of new date.

## 2022-12-01 DIAGNOSIS — I4819 Other persistent atrial fibrillation: Secondary | ICD-10-CM | POA: Diagnosis not present

## 2022-12-01 DIAGNOSIS — C884 Extranodal marginal zone B-cell lymphoma of mucosa-associated lymphoid tissue [MALT-lymphoma]: Secondary | ICD-10-CM | POA: Diagnosis not present

## 2022-12-01 DIAGNOSIS — I5033 Acute on chronic diastolic (congestive) heart failure: Secondary | ICD-10-CM | POA: Diagnosis not present

## 2022-12-01 DIAGNOSIS — I130 Hypertensive heart and chronic kidney disease with heart failure and stage 1 through stage 4 chronic kidney disease, or unspecified chronic kidney disease: Secondary | ICD-10-CM | POA: Diagnosis not present

## 2022-12-01 DIAGNOSIS — J9621 Acute and chronic respiratory failure with hypoxia: Secondary | ICD-10-CM | POA: Diagnosis not present

## 2022-12-01 DIAGNOSIS — E1122 Type 2 diabetes mellitus with diabetic chronic kidney disease: Secondary | ICD-10-CM | POA: Diagnosis not present

## 2022-12-01 DIAGNOSIS — N1831 Chronic kidney disease, stage 3a: Secondary | ICD-10-CM | POA: Diagnosis not present

## 2022-12-01 DIAGNOSIS — J189 Pneumonia, unspecified organism: Secondary | ICD-10-CM | POA: Diagnosis not present

## 2022-12-01 DIAGNOSIS — R011 Cardiac murmur, unspecified: Secondary | ICD-10-CM | POA: Diagnosis not present

## 2022-12-04 DIAGNOSIS — I4819 Other persistent atrial fibrillation: Secondary | ICD-10-CM | POA: Diagnosis not present

## 2022-12-04 DIAGNOSIS — E1122 Type 2 diabetes mellitus with diabetic chronic kidney disease: Secondary | ICD-10-CM | POA: Diagnosis not present

## 2022-12-04 DIAGNOSIS — N1831 Chronic kidney disease, stage 3a: Secondary | ICD-10-CM | POA: Diagnosis not present

## 2022-12-04 DIAGNOSIS — I130 Hypertensive heart and chronic kidney disease with heart failure and stage 1 through stage 4 chronic kidney disease, or unspecified chronic kidney disease: Secondary | ICD-10-CM | POA: Diagnosis not present

## 2022-12-04 DIAGNOSIS — I5033 Acute on chronic diastolic (congestive) heart failure: Secondary | ICD-10-CM | POA: Diagnosis not present

## 2022-12-04 DIAGNOSIS — J189 Pneumonia, unspecified organism: Secondary | ICD-10-CM | POA: Diagnosis not present

## 2022-12-04 DIAGNOSIS — J9621 Acute and chronic respiratory failure with hypoxia: Secondary | ICD-10-CM | POA: Diagnosis not present

## 2022-12-04 DIAGNOSIS — R011 Cardiac murmur, unspecified: Secondary | ICD-10-CM | POA: Diagnosis not present

## 2022-12-04 DIAGNOSIS — C884 Extranodal marginal zone B-cell lymphoma of mucosa-associated lymphoid tissue [MALT-lymphoma]: Secondary | ICD-10-CM | POA: Diagnosis not present

## 2022-12-05 ENCOUNTER — Ambulatory Visit: Payer: Medicare PPO | Admitting: Hematology

## 2022-12-05 ENCOUNTER — Other Ambulatory Visit: Payer: Medicare PPO

## 2022-12-08 DIAGNOSIS — I130 Hypertensive heart and chronic kidney disease with heart failure and stage 1 through stage 4 chronic kidney disease, or unspecified chronic kidney disease: Secondary | ICD-10-CM | POA: Diagnosis not present

## 2022-12-08 DIAGNOSIS — J189 Pneumonia, unspecified organism: Secondary | ICD-10-CM | POA: Diagnosis not present

## 2022-12-08 DIAGNOSIS — N1831 Chronic kidney disease, stage 3a: Secondary | ICD-10-CM | POA: Diagnosis not present

## 2022-12-08 DIAGNOSIS — E1122 Type 2 diabetes mellitus with diabetic chronic kidney disease: Secondary | ICD-10-CM | POA: Diagnosis not present

## 2022-12-08 DIAGNOSIS — I5033 Acute on chronic diastolic (congestive) heart failure: Secondary | ICD-10-CM | POA: Diagnosis not present

## 2022-12-08 DIAGNOSIS — R011 Cardiac murmur, unspecified: Secondary | ICD-10-CM | POA: Diagnosis not present

## 2022-12-08 DIAGNOSIS — I4819 Other persistent atrial fibrillation: Secondary | ICD-10-CM | POA: Diagnosis not present

## 2022-12-08 DIAGNOSIS — C884 Extranodal marginal zone B-cell lymphoma of mucosa-associated lymphoid tissue [MALT-lymphoma]: Secondary | ICD-10-CM | POA: Diagnosis not present

## 2022-12-08 DIAGNOSIS — J9621 Acute and chronic respiratory failure with hypoxia: Secondary | ICD-10-CM | POA: Diagnosis not present

## 2022-12-10 ENCOUNTER — Encounter: Payer: Self-pay | Admitting: Pulmonary Disease

## 2022-12-10 ENCOUNTER — Ambulatory Visit (INDEPENDENT_AMBULATORY_CARE_PROVIDER_SITE_OTHER): Payer: Medicare PPO

## 2022-12-10 ENCOUNTER — Ambulatory Visit: Payer: Medicare PPO | Admitting: Pulmonary Disease

## 2022-12-10 VITALS — BP 110/62 | HR 89 | Wt 175.2 lb

## 2022-12-10 DIAGNOSIS — I4891 Unspecified atrial fibrillation: Secondary | ICD-10-CM | POA: Diagnosis not present

## 2022-12-10 DIAGNOSIS — I509 Heart failure, unspecified: Secondary | ICD-10-CM | POA: Diagnosis not present

## 2022-12-10 DIAGNOSIS — J9601 Acute respiratory failure with hypoxia: Secondary | ICD-10-CM | POA: Diagnosis not present

## 2022-12-10 DIAGNOSIS — J90 Pleural effusion, not elsewhere classified: Secondary | ICD-10-CM

## 2022-12-10 NOTE — Patient Instructions (Signed)
Nice to meet you  Will get a chest x-ray today to see if the amount of fluid on the lungs is more than it was after the thoracentesis and January  If the fluid has reaccumulated, I will work on arranging a repeat thoracentesis, the procedure to remove fluid from around the lungs  Return to clinic in 6 weeks or sooner as needed with Dr. Silas Flood, if you are doing well we will certainly space things out after that I just want a make sure that the the fluid is staying off or reevaluate how frequently the fluid is reaccumulating

## 2022-12-10 NOTE — Progress Notes (Signed)
$'@Patient'F$  ID: Benedetto Coons, male    DOB: 1941-07-01, 82 y.o.   MRN: [id number]  Chief Complaint  Patient presents with   Consult    Pt is here for consult for chronic resp failure. Pt had a thoro by [facility] on 11/07/22. Pt is on 1.5L of o2 at all times. Echocardiogram done 11/08/22. Pt is not on any inhalers at the moment for his breathing. Pt gets winded very easily. Pt has a small cough.     Referring provider: Lois Huxley, PA  HPI:   82 y.o. whom we are seeing for evaluation of chronic hypoxemic respiratory failure as well as recurrent left pleural effusion.  Discharge summary reviewed.  Most recent pulmonary note 07/2019 reviewed.  Patient recently hospitalized.  Worsening hypoxemia.  Previously was on 2 to 3 L.  But ultimately was at home became more hypoxemic.  Prompted presentation to ED.  Chest x-ray on my review interpretation demonstrates large right-sided effusion on admission.  Underwent thoracentesis.  Post chest x-ray showed improvement but residual small to moderate right-sided pleural effusion.  His hypoxemia improved.  Tachycardia improved.  Felt better.  He was discharged on 1.5 L O2 via nasal cannula.  Continues on the same.  To get more short of breath over the last few days.  Oxygen saturation still in the mid 90s with a bit lower than prior.  Cough has returned.  Similar symptoms to when he related to the hospital.  Family worried about reaccumulation of pleural effusions.  Lymphoma of his lung.  Known about this for some time.  Last seen in oncology clinic 2022.  He has no desire for any treatment for additional diagnosis or intervention for this.  Discussed that the pleural effusion is most certainly related to the lymphoma.  Questionaires / Pulmonary Flowsheets:   ACT:      No data to display          MMRC:     No data to display          Epworth:      No data to display          Tests:   FENO:  No results found for:  "NITRICOXIDE"  PFT:    Latest Ref Rng & Units 01/18/2016    9:24 AM  PFT Results  FVC-Pre L 3.71   FVC-Predicted Pre % 87   FVC-Post L 3.82   FVC-Predicted Post % 90   Pre FEV1/FVC % % 83   Post FEV1/FCV % % 83   FEV1-Pre L 3.07   FEV1-Predicted Pre % 100   FEV1-Post L 3.16   DLCO uncorrected ml/min/mmHg 25.70   DLCO UNC% % 79   DLVA Predicted % 104   TLC L 6.04   TLC % Predicted % 85   RV % Predicted % 72   Personally reviewed and interpreted as normal spirometry, no bronchodilator response, lung volumes within normal limits, DLCO within normal limits  WALK:      No data to display          Imaging: Personally reviewed and as per EMR discussion this note No results found.  Lab Results: Personally reviewed and as per EMR CBC    Component Value Date/Time   WBC 1.5 (L) 11/09/2022 0332   RBC 2.12 (L) 11/09/2022 0332   HGB 7.6 (L) 11/09/2022 0332   HGB 12.9 (L) 04/11/2020 0856   HCT 21.8 (L) 11/09/2022 0332   PLT 144 (L) 11/09/2022 6433  PLT 119 (L) 04/11/2020 0856   MCV 102.8 (H) 11/09/2022 0332   MCH 35.8 (H) 11/09/2022 0332   MCHC 34.9 11/09/2022 0332   RDW 24.7 (H) 11/09/2022 0332   LYMPHSABS 0.2 (L) 11/07/2022 1803   MONOABS 0.0 (L) 11/07/2022 1803   EOSABS 0.0 11/07/2022 1803   BASOSABS 0.0 11/07/2022 1803    BMET    Component Value Date/Time   NA 136 11/09/2022 0332   NA 137 04/19/2018 1058   K 3.7 11/09/2022 0332   CL 98 11/09/2022 0332   CO2 27 11/09/2022 0332   GLUCOSE 97 11/09/2022 0332   BUN 21 11/09/2022 0332   BUN 20 04/19/2018 1058   CREATININE 1.26 (H) 11/09/2022 0332   CREATININE 1.12 09/02/2021 1359   CALCIUM 7.9 (L) 11/09/2022 0332   GFRNONAA 57 (L) 11/09/2022 0332   GFRNONAA >60 09/02/2021 1359   GFRAA >60 07/25/2020 0847    BNP    Component Value Date/Time   BNP 325.9 (H) 11/07/2022 1803    ProBNP No results found for: "PROBNP"  Specialty Problems       Pulmonary Problems   Solitary pulmonary nodule    Pneumothorax   Pneumothorax after biopsy   Lung mass    S/P surgical resection June 2019      Acute respiratory failure with hypoxia (HCC)   CAP (community acquired pneumonia)   Acute hypoxemic respiratory failure (HCC)   Pleural effusion    Allergies  Allergen Reactions   Hydrocodone-Acetaminophen Other (See Comments)    Suspected cause of confusion   Losartan Potassium-Hctz Nausea Only   Levaquin [Levofloxacin In D5w] Nausea Only   Penicillins Hives and Other (See Comments)    Has patient had a PCN reaction causing immediate rash, facial/tongue/throat swelling, SOB or lightheadedness with hypotension: unknown, childhood allergy Has patient had a PCN reaction causing severe rash involving mucus membranes or skin necrosis: No Has patient had a PCN reaction that required hospitalization No Has patient had a PCN reaction occurring within the last 10 years: No If all of the above answers are "NO", then may proceed with Cephalosporin use.     Immunization History  Administered Date(s) Administered   PFIZER(Purple Top)SARS-COV-2 Vaccination 01/05/2020, 02/01/2020    Past Medical History:  Diagnosis Date   Cervical spondylosis without myelopathy 12/01/2013   Diabetes mellitus    borderline   Enlarged prostate    Frequency of urination    Hearing deficit    Heart murmur    History of kidney stones    removed with Lithotripsy   HOH (hard of hearing)    right ear   Hyperlipemia    Hypertension    Lumbago 12/01/2013   Pneumonia 2016   Polio    right leg smaller than left   Shortness of breath dyspnea    with exertion    Tobacco History: Social History   Tobacco Use  Smoking Status Former   Packs/day: 0.50   Years: 9.00   Total pack years: 4.50   Types: Cigarettes   Quit date: 12/02/1967   Years since quitting: 55.0  Smokeless Tobacco Former   Types: Chew   Quit date: 2000   Counseling given: Not Answered   Continue to not smoke  Outpatient Encounter  Medications as of 12/10/2022  Medication Sig   alfuzosin (UROXATRAL) 10 MG 24 hr tablet Take 10 mg by mouth at bedtime.    Ascorbic Acid (SUPER C COMPLEX PO) Take 1 capsule by mouth every evening. Super C  1000   atorvastatin (LIPITOR) 20 MG tablet TAKE 1 TABLET BY MOUTH EVERY DAY (Patient taking differently: Take 20 mg by mouth daily.)   Cholecalciferol (VITAMIN D) 125 MCG (5000 UT) CAPS Take 5,000 Units by mouth daily.   Cyanocobalamin (VITAMIN B-12) 5000 MCG SUBL Place 5,000 mcg under the tongue every evening.   diltiazem (CARDIZEM CD) 120 MG 24 hr capsule Take 1 capsule (120 mg total) by mouth daily.   Ferrous Sulfate (IRON) 28 MG TABS Take 28 mg by mouth 3 (three) times a week.   finasteride (PROSCAR) 5 MG tablet Take 5 mg by mouth at bedtime.    furosemide (LASIX) 20 MG tablet Take 1 tablet (20 mg total) by mouth daily.   guaiFENesin-dextromethorphan (ROBITUSSIN DM) 100-10 MG/5ML syrup Take 15 mLs by mouth every 4 (four) hours as needed for cough.   hydrocortisone (ANUSOL-HC) 25 MG suppository Place 1 suppository (25 mg total) rectally 2 (two) times daily. (Patient taking differently: Place 25 mg rectally 2 (two) times daily as needed for hemorrhoids.)   loratadine (CLARITIN) 10 MG tablet Take 1 tablet (10 mg total) by mouth daily.   Omega-3 Fatty Acids (FISH OIL) 1200 MG CAPS Take 1,200 mg by mouth 3 (three) times a week.   OVER THE COUNTER MEDICATION Take 2 tablets by mouth daily. [place] Immune enhancement   OVER THE COUNTER MEDICATION Take 20 mLs by mouth daily. Colloidal Silver liquid for Immune booster   Polyethyl Glycol-Propyl Glycol 0.4-0.3 % SOLN Place 1 drop into both eyes 2 (two) times daily as needed (for dry/irritated eyes.).   rivaroxaban (XARELTO) 20 MG TABS tablet TAKE ONE TABLET BY MOUTH EVERYDAY AT BEDTIME (Patient taking differently: Take 20 mg by mouth every evening.)   traMADol (ULTRAM) 50 MG tablet Take 1 tablet (50 mg total) by mouth every 6 (six) hours as needed  (pain). (Patient taking differently: Take 25 mg by mouth every 6 (six) hours as needed for moderate pain (pain).)   valsartan (DIOVAN) 80 MG tablet Take 80 mg by mouth daily.   fluticasone (FLONASE) 50 MCG/ACT nasal spray Place 2 sprays into both nostrils daily. (Patient taking differently: Place 2 sprays into both nostrils daily as needed for allergies.)   No facility-administered encounter medications on file as of 12/10/2022.     Review of Systems  Review of Systems  No chest pain with exertion.  No orthopnea or PND.  Comprehensive review of systems otherwise negative. Physical Exam  BP 110/62 (BP Location: Left Arm, Patient Position: Sitting, Cuff Size: Normal)   Pulse 89   Wt 175 lb 3.2 oz (79.5 kg)   SpO2 93%   BMI 25.14 kg/m   Wt Readings from Last 5 Encounters:  12/10/22 175 lb 3.2 oz (79.5 kg)  11/07/22 186 lb 4.6 oz (84.5 kg)  07/10/22 186 lb 3.2 oz (84.5 kg)  01/28/22 184 lb (83.5 kg)  01/10/22 183 lb 6.8 oz (83.2 kg)    BMI Readings from Last 5 Encounters:  12/10/22 25.14 kg/m  11/07/22 26.73 kg/m  07/10/22 26.72 kg/m  01/28/22 26.40 kg/m  01/10/22 26.32 kg/m     Physical Exam General: Sitting in rollator walker, thin, cachectic appearing Eyes: EOMI, no icterus Neck: Supple, no JVP Pulmonary: Diminished in the right base, otherwise clear Cardiovascular: Regular rate and rhythm, no murmur Abdomen: Nondistended, bowel sounds present MSK: No synovitis, no joint effusion Neuro: Slow gait, diffusely weak without focal deficit, severely hard of hearing Psych: Normal mood, full affect   Assessment &  Plan:   Chronic hypoxemic respiratory failure: Suspect primarily driven by lymphoma involvement of of lung parenchyma.  Likely worsened over time as he been off therapy.  Exacerbated at times with compressive atelectasis due to what is suspected to be malignant pleural effusion.  Continue oxygen as prescribed, goal O2 sat 88% or higher, okay to keep in the low  the 90s if preferred.  Currently 1.5 L at rest.  Increases with exertion.  Recurrent right pleural effusion: Suspect related to underlying lymphoma as it was present in 2022 on CT scan worsened over time and enlarged noted hospitalization 11/2022 requiring thoracentesis.  Unfortunately, lights criteria was not sent.  But it is lymphocyte predominant.  Likely would need flow cytometry of pleural effusion to diagnose malignant pleural effusion.  High suspicion for malignant pleural effusion.  Chest x-ray today, bit more short of breath, anticipate arranging thoracentesis in the coming days for symptomatic benefit.  He did have significant symptomatic benefit 11/2022.  Discussed role and rationale for Pleurx in the future if needed.  Return in about 6 weeks (around 01/21/2023).   Lanier Clam, MD 12/10/2022   This appointment required 60 minutes of patient care (this includes precharting, chart review, review of results, face-to-face care, etc.).

## 2022-12-12 ENCOUNTER — Telehealth: Payer: Self-pay | Admitting: Pulmonary Disease

## 2022-12-12 DIAGNOSIS — E1122 Type 2 diabetes mellitus with diabetic chronic kidney disease: Secondary | ICD-10-CM | POA: Diagnosis not present

## 2022-12-12 DIAGNOSIS — J9621 Acute and chronic respiratory failure with hypoxia: Secondary | ICD-10-CM | POA: Diagnosis not present

## 2022-12-12 DIAGNOSIS — I5033 Acute on chronic diastolic (congestive) heart failure: Secondary | ICD-10-CM | POA: Diagnosis not present

## 2022-12-12 DIAGNOSIS — C884 Extranodal marginal zone B-cell lymphoma of mucosa-associated lymphoid tissue [MALT-lymphoma]: Secondary | ICD-10-CM | POA: Diagnosis not present

## 2022-12-12 DIAGNOSIS — J90 Pleural effusion, not elsewhere classified: Secondary | ICD-10-CM

## 2022-12-12 DIAGNOSIS — J189 Pneumonia, unspecified organism: Secondary | ICD-10-CM | POA: Diagnosis not present

## 2022-12-12 DIAGNOSIS — I4819 Other persistent atrial fibrillation: Secondary | ICD-10-CM | POA: Diagnosis not present

## 2022-12-12 DIAGNOSIS — N1831 Chronic kidney disease, stage 3a: Secondary | ICD-10-CM | POA: Diagnosis not present

## 2022-12-12 DIAGNOSIS — I130 Hypertensive heart and chronic kidney disease with heart failure and stage 1 through stage 4 chronic kidney disease, or unspecified chronic kidney disease: Secondary | ICD-10-CM | POA: Diagnosis not present

## 2022-12-12 DIAGNOSIS — R011 Cardiac murmur, unspecified: Secondary | ICD-10-CM | POA: Diagnosis not present

## 2022-12-12 NOTE — Telephone Encounter (Signed)
Can you please advise on patients chest xray?

## 2022-12-15 NOTE — Telephone Encounter (Signed)
Called and spoke with pt's grandson [name] letting him know about the info from Dr. Verlee Monte about when not to take the Xarelto. Also reminded [name] again about when pt needs to be at [facility] for the thoracentesis. Also checked with Dr. Verlee Monte about pt's cxr and he verbalized understanding.  Routing to procedure pool so they are aware that all has been handled for pt. Nothing further needed.

## 2022-12-15 NOTE — Telephone Encounter (Signed)
Needs to not take eliquis on 2/13, 2/14. Wait to resume until 2/16 provided he has no significant bleeding with procedure.

## 2022-12-15 NOTE — Telephone Encounter (Signed)
Fluid looks similar to time - ordering a thoracentesis to be performed by Interventional Radiology for later this week.

## 2022-12-15 NOTE — Telephone Encounter (Signed)
Dr. Verlee Monte, please advise when pt needs to stop Xarelto prior to procedure? Should he stop it today? Procedure is currently scheduled 2/15.

## 2022-12-15 NOTE — Telephone Encounter (Signed)
Pt has been scheduled for 2/15 at 10:00, will need to arrive at 9:30 at [facility].  Spoke to [place] and gave him appt info.  He states pt has another appt that day at 9:30.  Rescheduled to 1:00, arrive 12:30.  Gave new appt info to [facility].  He would like someone to call him to go over imaging results - he states no one ever called him.  He also needs to know if pt needs to stop Xarelto prior to procedure.  Please call asap.

## 2022-12-16 DIAGNOSIS — I130 Hypertensive heart and chronic kidney disease with heart failure and stage 1 through stage 4 chronic kidney disease, or unspecified chronic kidney disease: Secondary | ICD-10-CM | POA: Diagnosis not present

## 2022-12-16 DIAGNOSIS — C884 Extranodal marginal zone B-cell lymphoma of mucosa-associated lymphoid tissue [MALT-lymphoma]: Secondary | ICD-10-CM | POA: Diagnosis not present

## 2022-12-16 DIAGNOSIS — R011 Cardiac murmur, unspecified: Secondary | ICD-10-CM | POA: Diagnosis not present

## 2022-12-16 DIAGNOSIS — J189 Pneumonia, unspecified organism: Secondary | ICD-10-CM | POA: Diagnosis not present

## 2022-12-16 DIAGNOSIS — I4819 Other persistent atrial fibrillation: Secondary | ICD-10-CM | POA: Diagnosis not present

## 2022-12-16 DIAGNOSIS — I5033 Acute on chronic diastolic (congestive) heart failure: Secondary | ICD-10-CM | POA: Diagnosis not present

## 2022-12-16 DIAGNOSIS — J9621 Acute and chronic respiratory failure with hypoxia: Secondary | ICD-10-CM | POA: Diagnosis not present

## 2022-12-16 DIAGNOSIS — E1122 Type 2 diabetes mellitus with diabetic chronic kidney disease: Secondary | ICD-10-CM | POA: Diagnosis not present

## 2022-12-16 DIAGNOSIS — N1831 Chronic kidney disease, stage 3a: Secondary | ICD-10-CM | POA: Diagnosis not present

## 2022-12-18 ENCOUNTER — Ambulatory Visit (HOSPITAL_COMMUNITY): Payer: Medicare PPO

## 2022-12-18 DIAGNOSIS — N1831 Chronic kidney disease, stage 3a: Secondary | ICD-10-CM | POA: Diagnosis not present

## 2022-12-18 DIAGNOSIS — R011 Cardiac murmur, unspecified: Secondary | ICD-10-CM | POA: Diagnosis not present

## 2022-12-18 DIAGNOSIS — J189 Pneumonia, unspecified organism: Secondary | ICD-10-CM | POA: Diagnosis not present

## 2022-12-18 DIAGNOSIS — I5033 Acute on chronic diastolic (congestive) heart failure: Secondary | ICD-10-CM | POA: Diagnosis not present

## 2022-12-18 DIAGNOSIS — J9621 Acute and chronic respiratory failure with hypoxia: Secondary | ICD-10-CM | POA: Diagnosis not present

## 2022-12-18 DIAGNOSIS — C884 Extranodal marginal zone B-cell lymphoma of mucosa-associated lymphoid tissue [MALT-lymphoma]: Secondary | ICD-10-CM | POA: Diagnosis not present

## 2022-12-18 DIAGNOSIS — E1122 Type 2 diabetes mellitus with diabetic chronic kidney disease: Secondary | ICD-10-CM | POA: Diagnosis not present

## 2022-12-18 DIAGNOSIS — I4819 Other persistent atrial fibrillation: Secondary | ICD-10-CM | POA: Diagnosis not present

## 2022-12-18 DIAGNOSIS — I130 Hypertensive heart and chronic kidney disease with heart failure and stage 1 through stage 4 chronic kidney disease, or unspecified chronic kidney disease: Secondary | ICD-10-CM | POA: Diagnosis not present

## 2022-12-22 ENCOUNTER — Ambulatory Visit (HOSPITAL_COMMUNITY)
Admission: RE | Admit: 2022-12-22 | Discharge: 2022-12-22 | Disposition: A | Discharge status: Home / Self Care | Payer: Medicare PPO | Source: Ambulatory Visit | Attending: Pulmonary Disease | Admitting: Pulmonary Disease

## 2022-12-22 ENCOUNTER — Ambulatory Visit (HOSPITAL_COMMUNITY)
Admission: RE | Admit: 2022-12-22 | Discharge: 2022-12-22 | Disposition: A | Discharge status: Home / Self Care | Payer: Medicare PPO | Source: Ambulatory Visit | Attending: Student | Admitting: Student

## 2022-12-22 DIAGNOSIS — J939 Pneumothorax, unspecified: Secondary | ICD-10-CM | POA: Diagnosis not present

## 2022-12-22 DIAGNOSIS — J90 Pleural effusion, not elsewhere classified: Secondary | ICD-10-CM | POA: Insufficient documentation

## 2022-12-22 HISTORY — PX: IR THORACENTESIS ASP PLEURAL SPACE W/IMG GUIDE: IMG5380

## 2022-12-22 LAB — PROTEIN, PLEURAL OR PERITONEAL FLUID: Total protein, fluid: <3 g/dL

## 2022-12-22 LAB — BODY FLUID CELL COUNT WITH DIFFERENTIAL
Eos, Fluid: 0 %
Lymphs, Fluid: 92 %
Monocyte-Macrophage-Serous Fluid: 7 % — ABNORMAL LOW (ref 50–90)
Neutrophil Count, Fluid: 1 % (ref 0–25)
Total Nucleated Cell Count, Fluid: 892 cu mm (ref 0–1000)

## 2022-12-22 LAB — LACTATE DEHYDROGENASE, PLEURAL OR PERITONEAL FLUID: LD, Fluid: 96 U/L — ABNORMAL HIGH (ref 3–23)

## 2022-12-22 LAB — ALBUMIN, PLEURAL OR PERITONEAL FLUID: Albumin, Fluid: <1.5 g/dL

## 2022-12-22 MED ORDER — LIDOCAINE HCL 1 % IJ SOLN
INTRAMUSCULAR | Status: AC
  Administered 2022-12-22: 8 mL
  Filled 2022-12-22: qty 20

## 2022-12-22 NOTE — Procedures (Signed)
PROCEDURE SUMMARY:  Successful US guided right thoracentesis. Yielded 1.2 L of clear yellow fluid. Pt tolerated procedure well. No immediate complications.  Specimen sent for labs. CXR ordered; no post-procedure pneumothorax identified.   EBL < 2 mL  Theresa Duty, NP 12/22/2022 2:12 PM

## 2022-12-24 LAB — CYTOLOGY - NON PAP

## 2022-12-29 ENCOUNTER — Inpatient Hospital Stay: Payer: Medicare PPO | Attending: Hematology

## 2022-12-29 ENCOUNTER — Inpatient Hospital Stay: Payer: Medicare PPO | Admitting: Hematology

## 2022-12-30 LAB — COMP PANEL: LEUKEMIA/LYMPHOMA: Immunophenotypic Profile: 38

## 2022-12-30 LAB — MISC LABCORP TEST (SEND OUT): Labcorp test code: 480260

## 2023-01-02 ENCOUNTER — Other Ambulatory Visit: Payer: Self-pay

## 2023-01-02 ENCOUNTER — Encounter (HOSPITAL_COMMUNITY): Payer: Self-pay | Admitting: *Deleted

## 2023-01-02 ENCOUNTER — Inpatient Hospital Stay (HOSPITAL_COMMUNITY)
Admission: EM | Admit: 2023-01-02 | Discharge: 2023-01-05 | DRG: 840 | Disposition: A | Discharge status: Hospice | Payer: Medicare PPO | Attending: Internal Medicine | Admitting: Internal Medicine

## 2023-01-02 ENCOUNTER — Emergency Department (HOSPITAL_COMMUNITY): Payer: Medicare PPO

## 2023-01-02 DIAGNOSIS — Z7901 Long term (current) use of anticoagulants: Secondary | ICD-10-CM

## 2023-01-02 DIAGNOSIS — I21A1 Myocardial infarction type 2: Secondary | ICD-10-CM | POA: Diagnosis present

## 2023-01-02 DIAGNOSIS — Z801 Family history of malignant neoplasm of trachea, bronchus and lung: Secondary | ICD-10-CM

## 2023-01-02 DIAGNOSIS — E785 Hyperlipidemia, unspecified: Secondary | ICD-10-CM | POA: Diagnosis present

## 2023-01-02 DIAGNOSIS — Z885 Allergy status to narcotic agent status: Secondary | ICD-10-CM

## 2023-01-02 DIAGNOSIS — J9621 Acute and chronic respiratory failure with hypoxia: Secondary | ICD-10-CM | POA: Diagnosis present

## 2023-01-02 DIAGNOSIS — Z1152 Encounter for screening for COVID-19: Secondary | ICD-10-CM | POA: Diagnosis not present

## 2023-01-02 DIAGNOSIS — R54 Age-related physical debility: Secondary | ICD-10-CM | POA: Diagnosis present

## 2023-01-02 DIAGNOSIS — J9811 Atelectasis: Secondary | ICD-10-CM | POA: Diagnosis not present

## 2023-01-02 DIAGNOSIS — R140 Abdominal distension (gaseous): Secondary | ICD-10-CM | POA: Diagnosis not present

## 2023-01-02 DIAGNOSIS — C8590 Non-Hodgkin lymphoma, unspecified, unspecified site: Secondary | ICD-10-CM | POA: Diagnosis not present

## 2023-01-02 DIAGNOSIS — Z881 Allergy status to other antibiotic agents status: Secondary | ICD-10-CM | POA: Diagnosis not present

## 2023-01-02 DIAGNOSIS — D61818 Other pancytopenia: Secondary | ICD-10-CM | POA: Diagnosis present

## 2023-01-02 DIAGNOSIS — C884 Extranodal marginal zone B-cell lymphoma of mucosa-associated lymphoid tissue [MALT-lymphoma]: Secondary | ICD-10-CM | POA: Diagnosis present

## 2023-01-02 DIAGNOSIS — Z79899 Other long term (current) drug therapy: Secondary | ICD-10-CM | POA: Diagnosis not present

## 2023-01-02 DIAGNOSIS — R846 Abnormal cytological findings in specimens from respiratory organs and thorax: Secondary | ICD-10-CM | POA: Diagnosis not present

## 2023-01-02 DIAGNOSIS — Z9221 Personal history of antineoplastic chemotherapy: Secondary | ICD-10-CM | POA: Diagnosis not present

## 2023-01-02 DIAGNOSIS — A419 Sepsis, unspecified organism: Principal | ICD-10-CM | POA: Diagnosis present

## 2023-01-02 DIAGNOSIS — R627 Adult failure to thrive: Secondary | ICD-10-CM | POA: Diagnosis present

## 2023-01-02 DIAGNOSIS — Z833 Family history of diabetes mellitus: Secondary | ICD-10-CM

## 2023-01-02 DIAGNOSIS — G8929 Other chronic pain: Secondary | ICD-10-CM | POA: Diagnosis not present

## 2023-01-02 DIAGNOSIS — J189 Pneumonia, unspecified organism: Secondary | ICD-10-CM | POA: Diagnosis present

## 2023-01-02 DIAGNOSIS — Z515 Encounter for palliative care: Secondary | ICD-10-CM | POA: Diagnosis not present

## 2023-01-02 DIAGNOSIS — R0902 Hypoxemia: Secondary | ICD-10-CM | POA: Diagnosis present

## 2023-01-02 DIAGNOSIS — E1122 Type 2 diabetes mellitus with diabetic chronic kidney disease: Secondary | ICD-10-CM | POA: Diagnosis present

## 2023-01-02 DIAGNOSIS — I130 Hypertensive heart and chronic kidney disease with heart failure and stage 1 through stage 4 chronic kidney disease, or unspecified chronic kidney disease: Secondary | ICD-10-CM | POA: Diagnosis present

## 2023-01-02 DIAGNOSIS — J910 Malignant pleural effusion: Secondary | ICD-10-CM | POA: Diagnosis present

## 2023-01-02 DIAGNOSIS — I4891 Unspecified atrial fibrillation: Secondary | ICD-10-CM | POA: Diagnosis present

## 2023-01-02 DIAGNOSIS — Z8249 Family history of ischemic heart disease and other diseases of the circulatory system: Secondary | ICD-10-CM | POA: Diagnosis not present

## 2023-01-02 DIAGNOSIS — Z87891 Personal history of nicotine dependence: Secondary | ICD-10-CM

## 2023-01-02 DIAGNOSIS — Z66 Do not resuscitate: Secondary | ICD-10-CM | POA: Diagnosis present

## 2023-01-02 DIAGNOSIS — J90 Pleural effusion, not elsewhere classified: Secondary | ICD-10-CM | POA: Diagnosis not present

## 2023-01-02 DIAGNOSIS — R091 Pleurisy: Secondary | ICD-10-CM | POA: Diagnosis not present

## 2023-01-02 DIAGNOSIS — N1831 Chronic kidney disease, stage 3a: Secondary | ICD-10-CM | POA: Diagnosis present

## 2023-01-02 DIAGNOSIS — Z7189 Other specified counseling: Secondary | ICD-10-CM | POA: Diagnosis not present

## 2023-01-02 DIAGNOSIS — Z7401 Bed confinement status: Secondary | ICD-10-CM | POA: Diagnosis not present

## 2023-01-02 DIAGNOSIS — I5032 Chronic diastolic (congestive) heart failure: Secondary | ICD-10-CM | POA: Diagnosis present

## 2023-01-02 DIAGNOSIS — R0602 Shortness of breath: Secondary | ICD-10-CM | POA: Diagnosis not present

## 2023-01-02 DIAGNOSIS — Z880 Allergy status to penicillin: Secondary | ICD-10-CM

## 2023-01-02 DIAGNOSIS — J918 Pleural effusion in other conditions classified elsewhere: Secondary | ICD-10-CM | POA: Diagnosis present

## 2023-01-02 DIAGNOSIS — N400 Enlarged prostate without lower urinary tract symptoms: Secondary | ICD-10-CM | POA: Diagnosis present

## 2023-01-02 DIAGNOSIS — J9601 Acute respiratory failure with hypoxia: Secondary | ICD-10-CM | POA: Diagnosis not present

## 2023-01-02 LAB — I-STAT VENOUS BLOOD GAS, ED
Acid-base deficit: 3 mmol/L — ABNORMAL HIGH (ref 0.0–2.0)
Bicarbonate: 22.1 mmol/L (ref 20.0–28.0)
Calcium, Ion: 1.13 mmol/L — ABNORMAL LOW (ref 1.15–1.40)
HCT: 19 % — ABNORMAL LOW (ref 39.0–52.0)
Hemoglobin: 6.5 g/dL — CL (ref 13.0–17.0)
O2 Saturation: 59 %
Potassium: 4.4 mmol/L (ref 3.5–5.1)
Sodium: 138 mmol/L (ref 135–145)
TCO2: 23 mmol/L (ref 22–32)
pCO2, Ven: 37.3 mmHg — ABNORMAL LOW (ref 44–60)
pH, Ven: 7.38 (ref 7.25–7.43)
pO2, Ven: 31 mmHg — CL (ref 32–45)

## 2023-01-02 LAB — CBC WITH DIFFERENTIAL/PLATELET
Abs Immature Granulocytes: 0 10*3/uL (ref 0.00–0.07)
Basophils Absolute: 0 10*3/uL (ref 0.0–0.1)
Basophils Relative: 0 %
Eosinophils Absolute: 0 10*3/uL (ref 0.0–0.5)
Eosinophils Relative: 0 %
HCT: 18 % — ABNORMAL LOW (ref 39.0–52.0)
Hemoglobin: 5.6 g/dL — CL (ref 13.0–17.0)
Lymphocytes Relative: 3 %
Lymphs Abs: 0.1 10*3/uL — ABNORMAL LOW (ref 0.7–4.0)
MCH: 37.6 pg — ABNORMAL HIGH (ref 26.0–34.0)
MCHC: 31.1 g/dL (ref 30.0–36.0)
MCV: 120.8 fL — ABNORMAL HIGH (ref 80.0–100.0)
Monocytes Absolute: 0 10*3/uL — ABNORMAL LOW (ref 0.1–1.0)
Monocytes Relative: 0 %
Neutro Abs: 1.6 10*3/uL — ABNORMAL LOW (ref 1.7–7.7)
Neutrophils Relative %: 97 %
Platelets: 157 10*3/uL (ref 150–400)
RBC: 1.49 MIL/uL — ABNORMAL LOW (ref 4.22–5.81)
WBC: 1.7 10*3/uL — ABNORMAL LOW (ref 4.0–10.5)
nRBC: 0 % (ref 0.0–0.2)
nRBC: 0 /100 WBC

## 2023-01-02 LAB — BASIC METABOLIC PANEL
Anion gap: 13 (ref 5–15)
BUN: 23 mg/dL (ref 8–23)
CO2: 21 mmol/L — ABNORMAL LOW (ref 22–32)
Calcium: 8.6 mg/dL — ABNORMAL LOW (ref 8.9–10.3)
Chloride: 100 mmol/L (ref 98–111)
Creatinine, Ser: 1.47 mg/dL — ABNORMAL HIGH (ref 0.61–1.24)
GFR, Estimated: 48 mL/min — ABNORMAL LOW (ref >60)
Glucose, Bld: 173 mg/dL — ABNORMAL HIGH (ref 70–99)
Potassium: 4.1 mmol/L (ref 3.5–5.1)
Sodium: 134 mmol/L — ABNORMAL LOW (ref 135–145)

## 2023-01-02 LAB — TROPONIN I (HIGH SENSITIVITY): Troponin I (High Sensitivity): 56 ng/L — ABNORMAL HIGH (ref <18)

## 2023-01-02 LAB — LACTIC ACID, PLASMA: Lactic Acid, Venous: 4.5 mmol/L (ref 0.5–1.9)

## 2023-01-02 LAB — PREPARE RBC (CROSSMATCH)

## 2023-01-02 LAB — BRAIN NATRIURETIC PEPTIDE: B Natriuretic Peptide: 536.5 pg/mL — ABNORMAL HIGH (ref 0.0–100.0)

## 2023-01-02 MED ORDER — METRONIDAZOLE 500 MG/100ML IV SOLN
500.0000 mg | Freq: Once | INTRAVENOUS | Status: DC
  Administered 2023-01-03: 500 mg via INTRAVENOUS
  Filled 2023-01-02: qty 100

## 2023-01-02 MED ORDER — SODIUM CHLORIDE 0.9% IV SOLUTION: Freq: Once | INTRAVENOUS | Status: DC

## 2023-01-02 MED ORDER — SODIUM CHLORIDE 0.9 % IV SOLN
2.0000 g | Freq: Once | INTRAVENOUS | Status: AC
  Administered 2023-01-02: 2 g via INTRAVENOUS
  Filled 2023-01-02: qty 12.5

## 2023-01-02 MED ORDER — LACTATED RINGERS IV BOLUS
500.0000 mL | Freq: Once | INTRAVENOUS | Status: AC
  Administered 2023-01-02: 500 mL via INTRAVENOUS

## 2023-01-02 MED ORDER — VANCOMYCIN HCL IN DEXTROSE 1-5 GM/200ML-% IV SOLN
1000.0000 mg | INTRAVENOUS | Status: DC
  Filled 2023-01-02: qty 200

## 2023-01-02 MED ORDER — SODIUM CHLORIDE 0.9 % IV SOLN: 2.0000 g | Freq: Two times a day (BID) | INTRAVENOUS | Status: DC

## 2023-01-02 MED ORDER — VANCOMYCIN HCL IN DEXTROSE 1-5 GM/200ML-% IV SOLN: 1000.0000 mg | Freq: Once | INTRAVENOUS | Status: DC

## 2023-01-02 MED ORDER — LACTATED RINGERS IV SOLN: INTRAVENOUS | Status: DC

## 2023-01-02 MED ORDER — VANCOMYCIN HCL 1500 MG/300ML IV SOLN
1500.0000 mg | Freq: Once | INTRAVENOUS | Status: AC
  Administered 2023-01-02: 1500 mg via INTRAVENOUS
  Filled 2023-01-02: qty 300

## 2023-01-02 NOTE — ED Triage Notes (Signed)
Patient reported to have increased shortness of breath today.  He has hx of lymphoma.  He had thoracentesis with 1.5 Liters removed recently.  Patient is alert but noted to have resp distress at rest.  Son reports sats at home were in the 60's.  Patient with no reported fevers.  Patient is alert and oriented.

## 2023-01-02 NOTE — ED Notes (Signed)
Called respiratory, [facility], to come and assess the patient's O2 requirement

## 2023-01-02 NOTE — Sepsis Progress Note (Signed)
Elink monitoring for the code sepsis protocol.  

## 2023-01-02 NOTE — Progress Notes (Signed)
Pharmacy Antibiotic Note  James Sweeney is a 82 y.o. male admitted on 01/02/2023 presenting with SOB, concern for sepsis.  Pharmacy has been consulted for vancomycin and cefepime dosing.  Plan: Vancomycin 1500 mg IV x 1, then 1g IV every 24 hours (eAUC 460) Cefepime 2g IV every 12 hours Monitor renal function, Cx and clinical progression to narrow Vancomycin levels as needed  Height: '5\' 10"'$  (177.8 cm) Weight: 79 kg (174 lb 2.6 oz) IBW/kg (Calculated) : 73  No data recorded.  Recent Labs  Lab 01/02/23 2120  WBC 1.7*  CREATININE 1.47*  LATICACIDVEN 4.5*    Estimated Creatinine Clearance: 40.7 mL/min (A) (by C-G formula based on SCr of 1.47 mg/dL (H)).    Allergies  Allergen Reactions   Hydrocodone-Acetaminophen Other (See Comments)    Suspected cause of confusion   Losartan Potassium-Hctz Nausea Only   Levaquin [Levofloxacin In D5w] Nausea Only   Penicillins Hives and Other (See Comments)    Has patient had a PCN reaction causing immediate rash, facial/tongue/throat swelling, SOB or lightheadedness with hypotension: unknown, childhood allergy Has patient had a PCN reaction causing severe rash involving mucus membranes or skin necrosis: No Has patient had a PCN reaction that required hospitalization No Has patient had a PCN reaction occurring within the last 10 years: No If all of the above answers are "NO", then may proceed with Cephalosporin use.     Bertis Ruddy, PharmD, [place] Pharmacist ED Pharmacist Phone # [phone] 01/02/2023 10:46 PM

## 2023-01-02 NOTE — Progress Notes (Signed)
   01/02/23 2147  Therapy Vitals  Pulse Rate 77  Resp (!) 28  BP (!) 144/60  MEWS Score/Color  MEWS Score 2  MEWS Score Color Yellow  Oxygen Therapy/Pulse Ox  O2 Device HFNC  O2 Therapy Oxygen humidified  O2 Flow Rate (L/min) 10 L/min  SpO2 99 %   Found pt. On N/C 8L placed on salter HFNC at 10L pt. Sounds very wet.

## 2023-01-02 NOTE — ED Provider Notes (Signed)
[facility] Provider Note   CSN: [id number] Arrival date & time: 01/02/23  2042     History {Add pertinent medical, surgical, social history, OB history to HPI:1} Chief Complaint  Patient presents with   Shortness of Breath    James Sweeney is a 82 y.o. male.  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  Patient's oxygen was 64% on 3 L nasal cannula, was titrated up to 10 L and saturation went up to 86%.  Was subsequently transported to the emergency department by his son.  He was well yesterday, became acutely more short of breath today.  Had a thoracentesis done on 12/22/2022 with 1.2 L of fluid removed.  They deny any fevers or chills, no chest pain, no abdominal pain.   Shortness of Breath      Home Medications Prior to Admission medications   Medication Sig Start Date End Date Taking? Authorizing Provider  alfuzosin (UROXATRAL) 10 MG 24 hr tablet Take 10 mg by mouth at bedtime.     [provider]  Ascorbic Acid (SUPER C COMPLEX PO) Take 1 capsule by mouth every evening. Super C 1000    [provider]  atorvastatin (LIPITOR) 20 MG tablet TAKE 1 TABLET BY MOUTH EVERY DAY Patient taking differently: Take 20 mg by mouth daily. 09/02/21   Croitoru, Mihai, MD  Cholecalciferol (VITAMIN D) 125 MCG (5000 UT) CAPS Take 5,000 Units by mouth daily.    [provider]  Cyanocobalamin (VITAMIN B-12) 5000 MCG SUBL Place 5,000 mcg under the tongue every evening.    [provider]  diltiazem (CARDIZEM CD) 120 MG 24 hr capsule Take 1 capsule (120 mg total) by mouth daily. 01/11/22   Little Ishikawa, MD  Ferrous Sulfate (IRON) 28 MG TABS Take 28 mg by mouth 3 (three) times a week.    [provider]   finasteride (PROSCAR) 5 MG tablet Take 5 mg by mouth at bedtime.  05/16/15   [provider]  fluticasone (FLONASE) 50 MCG/ACT nasal spray Place 2 sprays into both nostrils daily. Patient taking differently: Place 2 sprays into both nostrils daily as needed for allergies. 01/11/22 11/08/22  Little Ishikawa, MD  furosemide (LASIX) 20 MG tablet Take 1 tablet (20 mg total) by mouth daily. 11/09/22 02/07/23  [place] Eric J, DO  guaiFENesin-dextromethorphan (ROBITUSSIN DM) 100-10 MG/5ML syrup Take 15 mLs by mouth every 4 (four) hours as needed for cough. 01/10/22   Little Ishikawa, MD  hydrocortisone (ANUSOL-HC) 25 MG suppository Place 1 suppository (25 mg total) rectally 2 (two) times daily. Patient taking differently: Place 25 mg rectally 2 (two) times daily as needed for hemorrhoids. 05/08/20   Brunetta Genera, MD  loratadine (CLARITIN) 10 MG tablet Take 1 tablet (10 mg total) by mouth daily. 01/11/22   Little Ishikawa, MD  Omega-3 Fatty Acids (FISH OIL) 1200 MG CAPS Take 1,200 mg by mouth 3 (three) times a week.    [provider]  OVER THE COUNTER MEDICATION Take 2 tablets by mouth daily. [place] Immune enhancement    [provider]  OVER THE COUNTER MEDICATION Take 20 mLs by mouth daily. Colloidal Silver liquid for Immune booster    [provider]  Polyethyl Glycol-Propyl Glycol 0.4-0.3 % SOLN Place 1 drop  into both eyes 2 (two) times daily as needed (for dry/irritated eyes.).    [provider]  rivaroxaban (XARELTO) 20 MG TABS tablet TAKE ONE TABLET BY MOUTH EVERYDAY AT BEDTIME Patient taking differently: Take 20 mg by mouth every evening. 08/04/22   Croitoru, Mihai, MD  traMADol (ULTRAM) 50 MG tablet Take 1 tablet (50 mg total) by mouth every 6 (six) hours as needed (pain). Patient taking differently: Take 25 mg by mouth every 6 (six) hours as needed for moderate pain (pain). 07/10/22   Croitoru, Mihai, MD  valsartan (DIOVAN) 80 MG tablet  Take 80 mg by mouth daily. 07/04/22   [provider]      Allergies    Hydrocodone-acetaminophen, Losartan potassium-hctz, Levaquin [levofloxacin in d5w], and Penicillins    Review of Systems   Review of Systems  Respiratory:  Positive for shortness of breath.     Physical Exam Updated Vital Signs There were no vitals taken for this visit. Physical Exam  ED Results / Procedures / Treatments   Labs (all labs ordered are listed, but only abnormal results are displayed) Labs Reviewed  CULTURE, BLOOD (ROUTINE X 2)  CULTURE, BLOOD (ROUTINE X 2)  CBC WITH DIFFERENTIAL/PLATELET  BASIC METABOLIC PANEL  LACTIC ACID, PLASMA  LACTIC ACID, PLASMA  BRAIN NATRIURETIC PEPTIDE  BLOOD GAS, VENOUS  TROPONIN I (HIGH SENSITIVITY)    EKG None  Radiology No results found.  Procedures Procedures  {Document cardiac monitor, telemetry assessment procedure when appropriate:1}  Medications Ordered in ED Medications - No data to display  ED Course/ Medical Decision Making/ A&P   {   Click here for ABCD2, HEART and other calculatorsREFRESH Note before signing :1}                          Medical Decision Making Amount and/or Complexity of Data Reviewed Labs: ordered.   ***  {Document critical care time when appropriate:1} {Document review of labs and clinical decision tools ie heart score, Chads2Vasc2 etc:1}  {Document your independent review of radiology images, and any outside records:1} {Document your discussion with family members, caretakers, and with consultants:1} {Document social determinants of health affecting pt's care:1} {Document your decision making why or why not admission, treatments were needed:1} Final Clinical Impression(s) / ED Diagnoses Final diagnoses:  None    Rx / DC Orders ED Discharge Orders     None

## 2023-01-03 ENCOUNTER — Inpatient Hospital Stay (HOSPITAL_COMMUNITY): Payer: Medicare PPO

## 2023-01-03 DIAGNOSIS — R0902 Hypoxemia: Secondary | ICD-10-CM | POA: Diagnosis present

## 2023-01-03 DIAGNOSIS — Z1152 Encounter for screening for COVID-19: Secondary | ICD-10-CM | POA: Diagnosis not present

## 2023-01-03 DIAGNOSIS — Z801 Family history of malignant neoplasm of trachea, bronchus and lung: Secondary | ICD-10-CM | POA: Diagnosis not present

## 2023-01-03 DIAGNOSIS — Z885 Allergy status to narcotic agent status: Secondary | ICD-10-CM | POA: Diagnosis not present

## 2023-01-03 DIAGNOSIS — Z8249 Family history of ischemic heart disease and other diseases of the circulatory system: Secondary | ICD-10-CM | POA: Diagnosis not present

## 2023-01-03 DIAGNOSIS — E1122 Type 2 diabetes mellitus with diabetic chronic kidney disease: Secondary | ICD-10-CM | POA: Diagnosis present

## 2023-01-03 DIAGNOSIS — J189 Pneumonia, unspecified organism: Secondary | ICD-10-CM | POA: Diagnosis present

## 2023-01-03 DIAGNOSIS — J9601 Acute respiratory failure with hypoxia: Secondary | ICD-10-CM

## 2023-01-03 DIAGNOSIS — Z515 Encounter for palliative care: Secondary | ICD-10-CM | POA: Diagnosis not present

## 2023-01-03 DIAGNOSIS — J910 Malignant pleural effusion: Secondary | ICD-10-CM | POA: Diagnosis present

## 2023-01-03 DIAGNOSIS — E785 Hyperlipidemia, unspecified: Secondary | ICD-10-CM | POA: Diagnosis present

## 2023-01-03 DIAGNOSIS — Z9221 Personal history of antineoplastic chemotherapy: Secondary | ICD-10-CM | POA: Diagnosis not present

## 2023-01-03 DIAGNOSIS — Z66 Do not resuscitate: Secondary | ICD-10-CM | POA: Diagnosis present

## 2023-01-03 DIAGNOSIS — C884 Extranodal marginal zone B-cell lymphoma of mucosa-associated lymphoid tissue [MALT-lymphoma]: Secondary | ICD-10-CM | POA: Diagnosis present

## 2023-01-03 DIAGNOSIS — Z833 Family history of diabetes mellitus: Secondary | ICD-10-CM | POA: Diagnosis not present

## 2023-01-03 DIAGNOSIS — J9621 Acute and chronic respiratory failure with hypoxia: Secondary | ICD-10-CM | POA: Diagnosis present

## 2023-01-03 DIAGNOSIS — Z79899 Other long term (current) drug therapy: Secondary | ICD-10-CM | POA: Diagnosis not present

## 2023-01-03 DIAGNOSIS — I21A1 Myocardial infarction type 2: Secondary | ICD-10-CM | POA: Diagnosis present

## 2023-01-03 DIAGNOSIS — N1831 Chronic kidney disease, stage 3a: Secondary | ICD-10-CM | POA: Diagnosis present

## 2023-01-03 DIAGNOSIS — J90 Pleural effusion, not elsewhere classified: Secondary | ICD-10-CM

## 2023-01-03 DIAGNOSIS — Z881 Allergy status to other antibiotic agents status: Secondary | ICD-10-CM | POA: Diagnosis not present

## 2023-01-03 DIAGNOSIS — D61818 Other pancytopenia: Secondary | ICD-10-CM | POA: Diagnosis present

## 2023-01-03 DIAGNOSIS — I130 Hypertensive heart and chronic kidney disease with heart failure and stage 1 through stage 4 chronic kidney disease, or unspecified chronic kidney disease: Secondary | ICD-10-CM | POA: Diagnosis present

## 2023-01-03 DIAGNOSIS — I5032 Chronic diastolic (congestive) heart failure: Secondary | ICD-10-CM | POA: Diagnosis present

## 2023-01-03 DIAGNOSIS — Z7901 Long term (current) use of anticoagulants: Secondary | ICD-10-CM | POA: Diagnosis not present

## 2023-01-03 DIAGNOSIS — I4891 Unspecified atrial fibrillation: Secondary | ICD-10-CM | POA: Diagnosis present

## 2023-01-03 DIAGNOSIS — Z7189 Other specified counseling: Secondary | ICD-10-CM

## 2023-01-03 DIAGNOSIS — N400 Enlarged prostate without lower urinary tract symptoms: Secondary | ICD-10-CM | POA: Diagnosis present

## 2023-01-03 LAB — BODY FLUID CELL COUNT WITH DIFFERENTIAL
Eos, Fluid: 0 %
Eos, Fluid: 0 %
Lymphs, Fluid: 77 %
Lymphs, Fluid: 44 %
Monocyte-Macrophage-Serous Fluid: 17 % — ABNORMAL LOW (ref 50–90)
Monocyte-Macrophage-Serous Fluid: 48 % — ABNORMAL LOW (ref 50–90)
Neutrophil Count, Fluid: 6 % (ref 0–25)
Neutrophil Count, Fluid: 8 % (ref 0–25)
Total Nucleated Cell Count, Fluid: 247 cu mm (ref 0–1000)
Total Nucleated Cell Count, Fluid: 322 cu mm (ref 0–1000)

## 2023-01-03 LAB — PROTIME-INR
INR: 1.6 — ABNORMAL HIGH (ref 0.8–1.2)
Prothrombin Time: 19 seconds — ABNORMAL HIGH (ref 11.4–15.2)

## 2023-01-03 LAB — CBC
HCT: 19.1 % — ABNORMAL LOW (ref 39.0–52.0)
HCT: 22.7 % — ABNORMAL LOW (ref 39.0–52.0)
Hemoglobin: 6.3 g/dL — CL (ref 13.0–17.0)
Hemoglobin: 7.4 g/dL — ABNORMAL LOW (ref 13.0–17.0)
MCH: 37.5 pg — ABNORMAL HIGH (ref 26.0–34.0)
MCH: 35.6 pg — ABNORMAL HIGH (ref 26.0–34.0)
MCHC: 33 g/dL (ref 30.0–36.0)
MCHC: 32.6 g/dL (ref 30.0–36.0)
MCV: 113.7 fL — ABNORMAL HIGH (ref 80.0–100.0)
MCV: 109.1 fL — ABNORMAL HIGH (ref 80.0–100.0)
Platelets: 121 10*3/uL — ABNORMAL LOW (ref 150–400)
Platelets: 125 10*3/uL — ABNORMAL LOW (ref 150–400)
RBC: 1.68 MIL/uL — ABNORMAL LOW (ref 4.22–5.81)
RBC: 2.08 MIL/uL — ABNORMAL LOW (ref 4.22–5.81)
RDW: 23.1 % — ABNORMAL HIGH (ref 11.5–15.5)
RDW: 24.9 % — ABNORMAL HIGH (ref 11.5–15.5)
WBC: 1.7 10*3/uL — ABNORMAL LOW (ref 4.0–10.5)
WBC: 1.6 10*3/uL — ABNORMAL LOW (ref 4.0–10.5)
nRBC: 1.2 % — ABNORMAL HIGH (ref 0.0–0.2)
nRBC: 1.2 % — ABNORMAL HIGH (ref 0.0–0.2)

## 2023-01-03 LAB — COMPREHENSIVE METABOLIC PANEL
ALT: 15 U/L (ref 0–44)
AST: 22 U/L (ref 15–41)
Albumin: 3.2 g/dL — ABNORMAL LOW (ref 3.5–5.0)
Alkaline Phosphatase: 134 U/L — ABNORMAL HIGH (ref 38–126)
Anion gap: 15 (ref 5–15)
BUN: 26 mg/dL — ABNORMAL HIGH (ref 8–23)
CO2: 19 mmol/L — ABNORMAL LOW (ref 22–32)
Calcium: 8.6 mg/dL — ABNORMAL LOW (ref 8.9–10.3)
Chloride: 103 mmol/L (ref 98–111)
Creatinine, Ser: 1.36 mg/dL — ABNORMAL HIGH (ref 0.61–1.24)
GFR, Estimated: 52 mL/min — ABNORMAL LOW (ref >60)
Glucose, Bld: 177 mg/dL — ABNORMAL HIGH (ref 70–99)
Potassium: 4.3 mmol/L (ref 3.5–5.1)
Sodium: 137 mmol/L (ref 135–145)
Total Bilirubin: 1.8 mg/dL — ABNORMAL HIGH (ref 0.3–1.2)
Total Protein: 7.3 g/dL (ref 6.5–8.1)

## 2023-01-03 LAB — LACTATE DEHYDROGENASE, PLEURAL OR PERITONEAL FLUID
LD, Fluid: 53 U/L — ABNORMAL HIGH (ref 3–23)
LD, Fluid: 88 U/L — ABNORMAL HIGH (ref 3–23)

## 2023-01-03 LAB — LACTIC ACID, PLASMA
Lactic Acid, Venous: 2.7 mmol/L (ref 0.5–1.9)
Lactic Acid, Venous: 1.9 mmol/L (ref 0.5–1.9)

## 2023-01-03 LAB — RESP PANEL BY RT-PCR (RSV, FLU A&B, COVID)  RVPGX2
Influenza A by PCR: NEGATIVE
Influenza B by PCR: NEGATIVE
Resp Syncytial Virus by PCR: NEGATIVE
SARS Coronavirus 2 by RT PCR: NEGATIVE

## 2023-01-03 LAB — GLUCOSE, PLEURAL OR PERITONEAL FLUID
Glucose, Fluid: 156 mg/dL
Glucose, Fluid: 128 mg/dL

## 2023-01-03 LAB — APTT
aPTT: 30 seconds (ref 24–36)
aPTT: 27 seconds (ref 24–36)

## 2023-01-03 LAB — PREPARE RBC (CROSSMATCH)

## 2023-01-03 LAB — BLOOD GAS, VENOUS
Acid-base deficit: 2.2 mmol/L — ABNORMAL HIGH (ref 0.0–2.0)
Bicarbonate: 23.2 mmol/L (ref 20.0–28.0)
Drawn by: 64037
O2 Saturation: 71.4 %
Patient temperature: 36.5
pCO2, Ven: 41 mmHg — ABNORMAL LOW (ref 44–60)
pH, Ven: 7.36 (ref 7.25–7.43)
pO2, Ven: 41 mmHg (ref 32–45)

## 2023-01-03 LAB — GLUCOSE, CAPILLARY
Glucose-Capillary: 182 mg/dL — ABNORMAL HIGH (ref 70–99)
Glucose-Capillary: 128 mg/dL — ABNORMAL HIGH (ref 70–99)
Glucose-Capillary: 141 mg/dL — ABNORMAL HIGH (ref 70–99)
Glucose-Capillary: 163 mg/dL — ABNORMAL HIGH (ref 70–99)
Glucose-Capillary: 165 mg/dL — ABNORMAL HIGH (ref 70–99)
Glucose-Capillary: 123 mg/dL — ABNORMAL HIGH (ref 70–99)

## 2023-01-03 LAB — PROTEIN, PLEURAL OR PERITONEAL FLUID
Total protein, fluid: <3 g/dL
Total protein, fluid: <3 g/dL

## 2023-01-03 LAB — TROPONIN I (HIGH SENSITIVITY)
Troponin I (High Sensitivity): 108 ng/L (ref <18)
Troponin I (High Sensitivity): 150 ng/L (ref <18)
Troponin I (High Sensitivity): 177 ng/L (ref <18)

## 2023-01-03 LAB — HEPARIN LEVEL (UNFRACTIONATED): Heparin Unfractionated: 0.28 IU/mL — ABNORMAL LOW (ref 0.30–0.70)

## 2023-01-03 LAB — MAGNESIUM: Magnesium: 1.9 mg/dL (ref 1.7–2.4)

## 2023-01-03 LAB — PHOSPHORUS: Phosphorus: 4.6 mg/dL (ref 2.5–4.6)

## 2023-01-03 LAB — PROCALCITONIN: Procalcitonin: 0.33 ng/mL

## 2023-01-03 MED ORDER — ATORVASTATIN CALCIUM 10 MG PO TABS
20.0000 mg | ORAL_TABLET | Freq: Every day | ORAL | Status: DC
  Administered 2023-01-03 – 2023-01-05 (×3): 20 mg via ORAL
  Filled 2023-01-03 (×3): qty 2

## 2023-01-03 MED ORDER — SODIUM CHLORIDE 0.9% IV SOLUTION: Freq: Once | INTRAVENOUS | Status: DC

## 2023-01-03 MED ORDER — POLYETHYLENE GLYCOL 3350 17 G PO PACK: 17.0000 g | PACK | Freq: Every day | ORAL | Status: DC | PRN

## 2023-01-03 MED ORDER — DOCUSATE SODIUM 100 MG PO CAPS: 100.0000 mg | ORAL_CAPSULE | Freq: Two times a day (BID) | ORAL | Status: DC | PRN

## 2023-01-03 MED ORDER — ORAL CARE MOUTH RINSE: 15.0000 mL | OROMUCOSAL | Status: DC | PRN

## 2023-01-03 MED ORDER — SODIUM CHLORIDE 0.9 % IV SOLN
2.0000 g | INTRAVENOUS | Status: DC
  Administered 2023-01-03 – 2023-01-05 (×3): 2 g via INTRAVENOUS
  Filled 2023-01-03 (×3): qty 20

## 2023-01-03 MED ORDER — TRAMADOL HCL 50 MG PO TABS
25.0000 mg | ORAL_TABLET | Freq: Four times a day (QID) | ORAL | Status: DC | PRN
  Administered 2023-01-03: 25 mg via ORAL
  Filled 2023-01-03: qty 1

## 2023-01-03 MED ORDER — HEPARIN (PORCINE) 25000 UT/250ML-% IV SOLN
1200.0000 [IU]/h | INTRAVENOUS | Status: DC
  Administered 2023-01-03: 1200 [IU]/h via INTRAVENOUS
  Filled 2023-01-03: qty 250

## 2023-01-03 MED ORDER — FINASTERIDE 5 MG PO TABS
5.0000 mg | ORAL_TABLET | Freq: Every day | ORAL | Status: DC
  Administered 2023-01-03 – 2023-01-04 (×2): 5 mg via ORAL
  Filled 2023-01-03 (×3): qty 1

## 2023-01-03 MED ORDER — ACETAMINOPHEN 325 MG PO TABS: 650.0000 mg | ORAL_TABLET | ORAL | Status: DC | PRN

## 2023-01-03 MED ORDER — IPRATROPIUM-ALBUTEROL 0.5-2.5 (3) MG/3ML IN SOLN: 3.0000 mL | RESPIRATORY_TRACT | Status: DC | PRN

## 2023-01-03 MED ORDER — FERROUS SULFATE 325 (65 FE) MG PO TABS
325.0000 mg | ORAL_TABLET | ORAL | Status: DC
  Administered 2023-01-05: 325 mg via ORAL
  Filled 2023-01-03: qty 1

## 2023-01-03 MED ORDER — DILTIAZEM HCL ER COATED BEADS 120 MG PO CP24
120.0000 mg | ORAL_CAPSULE | Freq: Every day | ORAL | Status: DC
  Administered 2023-01-03 – 2023-01-05 (×3): 120 mg via ORAL
  Filled 2023-01-03 (×3): qty 1

## 2023-01-03 MED ORDER — CHLORHEXIDINE GLUCONATE CLOTH 2 % EX PADS
6.0000 | MEDICATED_PAD | Freq: Every day | CUTANEOUS | Status: DC
  Administered 2023-01-03 – 2023-01-05 (×3): 6 via TOPICAL

## 2023-01-03 MED ORDER — SODIUM CHLORIDE 0.9 % IV SOLN
500.0000 mg | Freq: Every day | INTRAVENOUS | Status: DC
  Administered 2023-01-03 – 2023-01-05 (×3): 500 mg via INTRAVENOUS
  Filled 2023-01-03 (×4): qty 5

## 2023-01-03 MED ORDER — ORAL CARE MOUTH RINSE
15.0000 mL | OROMUCOSAL | Status: DC
  Administered 2023-01-03 – 2023-01-05 (×11): 15 mL via OROMUCOSAL

## 2023-01-03 MED ORDER — FUROSEMIDE 10 MG/ML IJ SOLN
40.0000 mg | Freq: Once | INTRAMUSCULAR | Status: AC
  Administered 2023-01-03: 40 mg via INTRAVENOUS
  Filled 2023-01-03: qty 4

## 2023-01-03 MED ORDER — ALFUZOSIN HCL ER 10 MG PO TB24
10.0000 mg | ORAL_TABLET | Freq: Every day | ORAL | Status: DC
  Administered 2023-01-03 – 2023-01-05 (×3): 10 mg via ORAL
  Filled 2023-01-03 (×5): qty 1

## 2023-01-03 MED ORDER — VITAMIN B-12 1000 MCG PO TABS
5000.0000 ug | ORAL_TABLET | Freq: Every evening | ORAL | Status: DC
  Administered 2023-01-03 – 2023-01-05 (×3): 5000 ug via ORAL
  Filled 2023-01-03 (×3): qty 5
  Filled 2023-01-03: qty 50

## 2023-01-03 NOTE — Progress Notes (Signed)
Patient transferred from ED bed to ICU bed on assessment patient has increased work of breathing, tachypnea and oxygen sats in low 80s on 15L HFNC. Elink MD at bedside, orders given to place patient on bipap. O2 sats improved mid-high 90s and work of breathing has decreased.

## 2023-01-03 NOTE — Progress Notes (Signed)
OT Cancellation Note  Patient Details Name: James Sweeney MRN: [id number] DOB: 17-Oct-1941   Cancelled Treatment:    Reason Eval/Treat Not Completed: Medical issues which prohibited therapy. Awaiting [name] for possible DVT. Will hold and return as schedule allows.   [place], OTR/L Acute Rehab Office: [phone] 01/03/2023, 2:45 PM

## 2023-01-03 NOTE — Progress Notes (Signed)
[place] for heparin Indication: atrial fibrillation  Allergies  Allergen Reactions   Hydrocodone-Acetaminophen Other (See Comments)    Suspected cause of confusion   Losartan Potassium-Hctz Nausea Only   Levaquin [Levofloxacin In D5w] Nausea Only   Penicillins Hives and Other (See Comments)    Has patient had a PCN reaction causing immediate rash, facial/tongue/throat swelling, SOB or lightheadedness with hypotension: unknown, childhood allergy Has patient had a PCN reaction causing severe rash involving mucus membranes or skin necrosis: No Has patient had a PCN reaction that required hospitalization No Has patient had a PCN reaction occurring within the last 10 years: No If all of the above answers are "NO", then may proceed with Cephalosporin use.     Patient Measurements: Height: '5\' 10"'$  (177.8 cm) Weight: 79.3 kg (174 lb 13.2 oz) IBW/kg (Calculated) : 73  Vital Signs: Temp: 98.1 F (36.7 C) (03/02 1109) Temp Source: Oral (03/02 1109) BP: 140/77 (03/02 0810) Pulse Rate: 84 (03/02 0810)  Labs: Recent Labs    01/02/23 2120 01/02/23 2132 01/02/23 2240 01/03/23 0337 01/03/23 0545 01/03/23 0901 01/03/23 1104  HGB 5.6* 6.5*  --  6.3*  --  7.4*  --   HCT 18.0* 19.0*  --  19.1*  --  22.7*  --   PLT 157  --   --  121*  --  125*  --   APTT  --   --   --  30  --   --  27  LABPROT  --   --   --  19.0*  --   --   --   INR  --   --   --  1.6*  --   --   --   HEPARINUNFRC  --   --   --   --   --   --  0.28*  CREATININE 1.47*  --   --  1.36*  --   --   --   TROPONINIHS 56*  --  108* 150* 177*  --   --      Estimated Creatinine Clearance: 44 mL/min (A) (by C-G formula based on SCr of 1.36 mg/dL (H)).   Medical History: Past Medical History:  Diagnosis Date   Cervical spondylosis without myelopathy 12/01/2013   Diabetes mellitus    borderline   Enlarged prostate    Frequency of urination    Hearing deficit    Heart murmur     History of kidney stones    removed with Lithotripsy   HOH (hard of hearing)    right ear   Hyperlipemia    Hypertension    Lumbago 12/01/2013   Pneumonia 2016   Polio    right leg smaller than left   Shortness of breath dyspnea    with exertion    Assessment: 82yo male admitted for acute on chronic respiratory failure with sepsis/CAP, to transition from Xarelto (last dose 2/29 1930) to heparin infusion for Afib.   Initial aPTT low at 27, heparin level 0.28 (likely elevated from true value due to influence of Xarelto). Hg down to 6.3 this AM >> improved to 7.4 s/p 4 units PRBC. Plt down 157>125. No active bleed issues reported. Unable to lie flat for CTA, but per notes, CCM doubts PE. LE dopplers pending to r/o DVT.  Goal of Therapy:  Heparin level 0.3-0.7 units/ml aPTT 66-102 seconds Monitor platelets by anticoagulation protocol: Yes   Plan:  Per discussion with Dr. Tamala Julian (CCM),  hold heparin for today with low Hg requiring multiple transfusions and monitor overnight. F/u plans 3/3 AM for resumption of anticoagulaton as appropriate Monitor CBC closely, s/sx bleeding   Arturo Morton, PharmD, BCPS Please check AMION for all [facility] contact numbers Clinical Pharmacist 01/03/2023 1:35 PM

## 2023-01-03 NOTE — Progress Notes (Signed)
ANTICOAGULATION CONSULT NOTE - Initial Consult  Pharmacy Consult for heparin Indication: atrial fibrillation  Allergies  Allergen Reactions   Hydrocodone-Acetaminophen Other (See Comments)    Suspected cause of confusion   Losartan Potassium-Hctz Nausea Only   Levaquin [Levofloxacin In D5w] Nausea Only   Penicillins Hives and Other (See Comments)    Has patient had a PCN reaction causing immediate rash, facial/tongue/throat swelling, SOB or lightheadedness with hypotension: unknown, childhood allergy Has patient had a PCN reaction causing severe rash involving mucus membranes or skin necrosis: No Has patient had a PCN reaction that required hospitalization No Has patient had a PCN reaction occurring within the last 10 years: No If all of the above answers are "NO", then may proceed with Cephalosporin use.     Patient Measurements: Height: '5\' 10"'$  (177.8 cm) Weight: 79 kg (174 lb 2.6 oz) IBW/kg (Calculated) : 73  Vital Signs: Temp: 97.9 F (36.6 C) (03/02 0050) Temp Source: Oral (03/02 0050) BP: 117/75 (03/02 0050) Pulse Rate: 82 (03/02 0050)  Labs: Recent Labs    01/02/23 2120 01/02/23 2132 01/02/23 2240  HGB 5.6* 6.5*  --   HCT 18.0* 19.0*  --   PLT 157  --   --   CREATININE 1.47*  --   --   TROPONINIHS 56*  --  108*    Estimated Creatinine Clearance: 40.7 mL/min (A) (by C-G formula based on SCr of 1.47 mg/dL (H)).   Medical History: Past Medical History:  Diagnosis Date   Cervical spondylosis without myelopathy 12/01/2013   Diabetes mellitus    borderline   Enlarged prostate    Frequency of urination    Hearing deficit    Heart murmur    History of kidney stones    removed with Lithotripsy   HOH (hard of hearing)    right ear   Hyperlipemia    Hypertension    Lumbago 12/01/2013   Pneumonia 2016   Polio    right leg smaller than left   Shortness of breath dyspnea    with exertion    Assessment: 82yo male admitted for acute on chronic respiratory  failure with sepsis/?CAP, to transition from Xarelto (last dose 2/29 1930) to UFH for Afib.  Goal of Therapy:  Heparin level 0.3-0.7 units/ml aPTT 66-102 seconds Monitor platelets by anticoagulation protocol: Yes   Plan:  Start heparin infusion at 1200 units/hr. Monitor heparin levels, aPTT (while rivaroxaban affects anti-Xa assay), and CBC.  Wynona Neat, PharmD, BCPS  01/03/2023,2:05 AM

## 2023-01-03 NOTE — Procedures (Signed)
Thoracentesis  Procedure Note  James Sweeney  [id number]  May 16, 1941  Date:01/03/23  Time:8:21 AM   Provider Performing:Waverly Chavarria C Tamala Julian   Procedure: Thoracentesis with imaging guidance [id number])  Indication(s) Pleural Effusion  Consent Risks of the procedure as well as the alternatives and risks of each were explained to the patient and/or caregiver.  Consent for the procedure was obtained and is signed in the bedside chart  Anesthesia Topical only with 1% lidocaine    Time Out Verified patient identification, verified procedure, site/side was marked, verified correct patient position, special equipment/implants available, medications/allergies/relevant history reviewed, required imaging and test results available.   Sterile Technique Maximal sterile technique including full sterile barrier drape, hand hygiene, sterile gown, sterile gloves, mask, hair covering, sterile ultrasound probe cover (if used).  Procedure Description Ultrasound was used to identify appropriate pleural anatomy for placement and overlying skin marked.  Area of drainage cleaned and draped in sterile fashion. Lidocaine was used to anesthetize the skin and subcutaneous tissue.  700 cc's of straw appearing fluid was drained from the right pleural space. Catheter then removed and bandaid applied to site.   Complications/Tolerance None; patient tolerated the procedure well. Chest X-ray is ordered to confirm no post-procedural complication.   EBL Minimal   Specimen(s) Pleural fluid

## 2023-01-03 NOTE — Sepsis Progress Note (Addendum)
Notified provider of need to order fluid bolus.  Doing gentle fluid resuscitation d/t patient h/o CHF per MD.

## 2023-01-03 NOTE — Sepsis Progress Note (Addendum)
Notified ED bedside nurse of need to draw repeat lactic acid.  Not collected prior to transfer to ICU.

## 2023-01-03 NOTE — Progress Notes (Signed)
Doing well after thora. His CBC actually shows appropriate transfusion response. Issues remaining are FTT, abnormal CXR, untreated cancer, frail elderly. Appreciate PMT help. Will get CT chest to help with family discussions, will comment on this when it comes back. Son updated at bedside. Appreciate TRH taking over patient 01/04/23 am.  Erskine Emery MD PCCM

## 2023-01-03 NOTE — Progress Notes (Signed)
PT Cancellation Note  Patient Details Name: James Sweeney MRN: [id number] DOB: 02-Aug-1941   Cancelled Treatment:    Reason Eval/Treat Not Completed: Medical issues which prohibited therapy  Patient now waiting for bil LE dopplers to rule out DVT. Per RN, his heparin has been stopped, therefore will await test results.    [place]  Office [phone]   Rexanne Mano 01/03/2023, 2:42 PM

## 2023-01-03 NOTE — Progress Notes (Signed)
OT Cancellation Note  Patient Details Name: James Sweeney MRN: [id number] DOB: [birth year]   Cancelled Treatment:    Reason Eval/Treat Not Completed: Medical issues which prohibited therapy (Patient now on BiPap and being watched for possible intubation. Will follow and proceed with evaluation when appropriate.)  [place], OTR/L Acute Rehab Office: [phone] 01/03/2023, 8:23 AM

## 2023-01-03 NOTE — Procedures (Signed)
Thoracentesis  Procedure Note  James Sweeney  [id number]  16-Jul-1941  Date:01/03/23  Time:8:20 AM   Provider Performing:Navy Belay C Tamala Julian   Procedure: Thoracentesis with imaging guidance [id number])  Indication(s) Pleural Effusion  Consent Risks of the procedure as well as the alternatives and risks of each were explained to the patient and/or caregiver.  Consent for the procedure was obtained and is signed in the bedside chart  Anesthesia Topical only with 1% lidocaine    Time Out Verified patient identification, verified procedure, site/side was marked, verified correct patient position, special equipment/implants available, medications/allergies/relevant history reviewed, required imaging and test results available.   Sterile Technique Maximal sterile technique including full sterile barrier drape, hand hygiene, sterile gown, sterile gloves, mask, hair covering, sterile ultrasound probe cover (if used).  Procedure Description Ultrasound was used to identify appropriate pleural anatomy for placement and overlying skin marked.  Area of drainage cleaned and draped in sterile fashion. Lidocaine was used to anesthetize the skin and subcutaneous tissue.  1200 cc's of straw appearing fluid was drained from the left pleural space. Catheter then removed and bandaid applied to site.   Complications/Tolerance None; patient tolerated the procedure well. Chest X-ray is ordered to confirm no post-procedural complication.   EBL Minimal   Specimen(s) Pleural fluid

## 2023-01-03 NOTE — Consult Note (Signed)
Palliative Care Consult Note                                  Date: 01/03/2023   Patient Name: James Sweeney  DOB: 06-12-1941  MRN: [id number]  Age / Sex: 82 y.o., male  PCP: James Huxley, PA Referring Physician: Candee Furbish, MD  Reason for Consultation: Establishing goals of care  HPI/Patient Profile: Palliative Care consult requested for goals of care discussion in this 82 y.o. male  with past medical history of marginal zone lymphoma/MALT s/p chemotherapy in 2021 (patient refuses additional work-up or treatment), pancytopenia, atrial fibrillation (Xarelto), HTN, CKD IIIa, T2DM, chronic back pain, CHF, and chronic respiratory failure on 2L. He was admitted on 01/02/2023 from home with hypoxic respiratory failure. Family reports patient with shortness of breath in home and 64% on 3L/James Sweeney. Patient recently hospitalized and treated for recurrent pneumonia. On work-up found to have right pleural effusion. Thoracentesis yielded 726m. Previous Thoracentesis on 2/19 yielding 1.2L,1/5 yielding 1.8L. Currently on HFNC.   Past Medical History:  Diagnosis Date   Cervical spondylosis without myelopathy 12/01/2013   Diabetes mellitus    borderline   Enlarged prostate    Frequency of urination    Hearing deficit    Heart murmur    History of kidney stones    removed with Lithotripsy   HOH (hard of hearing)    right ear   Hyperlipemia    Hypertension    Lumbago 12/01/2013   Pneumonia 2016   Polio    right leg smaller than left   Shortness of breath dyspnea    with exertion     Subjective:   This NP NOsborne Omanreviewed medical records, received report from team, assessed the patient and then met at the patient's bedside with patient and grandson/POA James Sweeney to discuss diagnosis, prognosis, GOC, EOL wishes disposition and options. Wife also previously at bedside however with some component of dementia.   James Sweeney awake and alert.  Answers some questions appropriately. Extremely hard of hearing. Permission to engage in discussions with grandson.    Concept of Palliative Care was introduced as specialized medical care for people and their families living with serious illness.  It focuses on providing relief from the symptoms and stress of a serious illness.  The goal is to improve quality of life for both the patient and the family. Values and goals of care important to patient and family were attempted to be elicited. Patient is familiar to our palliative team from previous admissions.   I created space and opportunity for family to explore state of health prior to admission, thoughts, and feelings.   CCheri Sweeney patient lives in the home with his wife and son. Unfortunately son is faced with his own challenges with Bipolar and patient's wife suffers from early dementia.   Patient is able to perform most ADLs independently. Ambulates around the home. Uses his home oxygen however at times may be found not wearing. Family has been trying to care for patient. [facility] was coming in from [place] her last day of services was a week prior from patient's decline.   We discussed His current illness and what it means in the larger context of His on-going co-morbidities. Natural disease trajectory and expectations were discussed.  James Sweeney verbalized understanding of current illness and co-morbidities. He is realistic in his understanding. He shares Mr.  Sweeney has been clear he does not wish to undergo any further work-up for his lymphoma or any treatment. They are aware his cancer will continue to progress resulting in increased symptoms. Patient did not tolerate treatments in 2021 or testing resulting in him no longer wishing to pursue oncological interventions.   I had and open and honest discussion with family regarding patient's ongoing decline over the past months, expectations for future, and poor prognosis. James Sweeney  verbalized understanding expressing understanding of "not if but when!" He shares family are all aware and can see James Sweeney decline. They wish to keep him at home at all cost.   I discussed the importance of continued conversation with family and their medical providers regarding overall plan of care and treatment options, ensuring decisions are within the context of the patients values and GOCs.  Questions and concerns were addressed. The family was encouraged to call with questions or concerns.  PMT will continue to support holistically as needed.   Objective:   Primary Diagnoses: Present on Admission:  Acute respiratory failure with hypoxia (HCC)   Scheduled Meds:  sodium chloride   Intravenous Once   sodium chloride   Intravenous Once   alfuzosin  10 mg Oral Q breakfast   atorvastatin  20 mg Oral Daily   Chlorhexidine Gluconate Cloth  6 each Topical Q0600   diltiazem  120 mg Oral Daily   [START ON 01/05/2023] ferrous sulfate  325 mg Oral Once per day on Mon Wed Fri   finasteride  5 mg Oral QHS   mouth rinse  15 mL Mouth Rinse 4 times per day   vitamin B-12  5,000 mcg Oral QPM    Continuous Infusions:  azithromycin Stopped (01/03/23 0442)   cefTRIAXone (ROCEPHIN)  IV 2 g (01/03/23 0939)   heparin 1,200 Units/hr (01/03/23 0600)    PRN Meds: acetaminophen, docusate sodium, ipratropium-albuterol, mouth rinse, polyethylene glycol  Allergies  Allergen Reactions   Hydrocodone-Acetaminophen Other (See Comments)    Suspected cause of confusion   Losartan Potassium-Hctz Nausea Only   Levaquin [Levofloxacin In D5w] Nausea Only   Penicillins Hives and Other (See Comments)    Has patient had a PCN reaction causing immediate rash, facial/tongue/throat swelling, SOB or lightheadedness with hypotension: unknown, childhood allergy Has patient had a PCN reaction causing severe rash involving mucus membranes or skin necrosis: No Has patient had a PCN reaction that required  hospitalization No Has patient had a PCN reaction occurring within the last 10 years: No If all of the above answers are "NO", then may proceed with Cephalosporin use.     Review of Systems  Unable to perform ROS: Acuity of condition  Hard of Hearing   Physical Exam General: NAD, frail chronically-ill appearing Cardiovascular: Irregular  Pulmonary: 8L HFNC, diminished bilaterally  Abdomen: soft, nontender, + bowel sounds Extremities:bilateral lower extremity edema, no joint deformities Skin: no rashes, warm and dry Neurological: AAO, hard of hearing   Vital Signs:  BP (!) 140/77   Pulse 84   Temp 98.1 F (36.7 C) (Oral)   Resp (!) 33   Ht '5\' 10"'$  (1.778 m)   Wt 79.3 kg   SpO2 100%   BMI 25.08 kg/m  Pain Scale: 0-10   Pain Score: Asleep  SpO2: SpO2: 100 % O2 Device:SpO2: 100 % O2 Flow Rate: .O2 Flow Rate (L/min): 5 L/min  IO: Intake/output summary:  Intake/Output Summary (Last 24 hours) at 01/03/2023 1140 Last data filed at 01/03/2023 0635 Gross per 24  hour  Intake 1508.5 ml  Output 250 ml  Net 1258.5 ml    LBM: Last BM Date :  (pta) Baseline Weight: Weight: 79 kg Most recent weight: Weight: 79.3 kg      Palliative Assessment/Data: PPS 20%   Advanced Care Planning:   Primary Decision Maker: HCPOA-James Sweeney Coble (Grandson)  Code Status/Advance Care Planning: Full code  A discussion was had today regarding advanced directives. Concepts specific to code status, artifical feeding and hydration, continued IV antibiotics and rehospitalization was had.  The difference between a aggressive medical intervention path and a palliative comfort care path was discussed.  Patient has a documented advanced directive on file. I reviewed documentation at length with James Sweeney emphasizing patient's documented wishes for no life prolonging measures in setting of terminal condition, dementia, or unconscious state. Strong recommendations provided for DNR/DNI. James Sweeney verbalized  understanding however does not feel that despite patient's tenuous state he is not in either of the states as documented. I discussed his untreated cancer and respectfully his wishes to forgo treatment and work-up with understanding that his lymphoma, symptoms, and respiratory state will result in his death. James Sweeney confirms requesting patient remain Full Code at this time. He is prepared to make decisions when needed but would like to take things one day at a time while closely evaluating patient's condition and response to treatments.   Hospice and Palliative Care services outpatient were explained and offered. James Sweeney verbalized understanding and awareness of both palliative and hospice's goals and philosophy of care. Per James Sweeney patient was placed on list for outpatient palliative with [facility]. Advised given his poor prognosis and health decline, expressed wishes for limited treatment he would be most appropriate for outpatient hospice support with understanding the goal would be to manage symptoms aggressively in the home with hospice support. I also provided education on residential hospice if needed. James Sweeney verbalized understanding. He reports he knows patient would want to be at home for what time he had left if at all possible. James Sweeney plans to engage in discussions with his family considering hospice support.   Assessment & Plan:   SUMMARY OF RECOMMENDATIONS   /Full Code-as requested and confirmed by grandson/HCPOA. Recommendations provided DNR. Encouraged ongoing discussions considering patient's prognosis and quality of life.  Extensive goals of care discussions. Family is realistic in understanding. Wishes to continue to treat as patient has previously responded to similar treatment and improved over the past 24 hours. Confirms no oncological interventions or work-up.  Education provided on outpatient hospice support in the home. Family understands patient meets criteria. Education  provided on hospice's goals, philosophy of care, and what care would look like in the home. James Sweeney would like to engage further with family prior to making final decisions.  Continue with current plan of care.  PMT will continue to support and follow. Please call team line/secure chat with urgent needs.  Symptom Management:  Per Attending  Palliative Prophylaxis:  Aspiration, Bowel Regimen, Delirium Protocol, Frequent Pain Assessment, Oral Care, Turn Reposition, and Respiratory support   Additional Recommendations (Limitations, Scope, Preferences): Continue to treat the treatable  Psycho-social/Spiritual:  Desire for further Chaplaincy support: no Additional Recommendations: Education on Hospice and ongoing goals of care discussions   Prognosis:  Poor   Discharge Planning:  To Be Determined   Discussed with: Family, Dr. Tamala Julian, and Loree Fee, NP.   Grandson/HCPOA, James Sweeney expressed understanding and was in agreement with this plan.   Time Total: 70 min   Visit consisted of  counseling and education dealing with the complex and emotionally intense issues of symptom management and palliative care in the setting of serious and potentially life-threatening illness.Greater than 50%  of this time was spent counseling and coordinating care related to the above assessment and plan.  Signed by:  Alda Lea, AGPCNP-BC [place]   Phone: [phone] Pager: [phone] Amion: Bjorn Pippin   Thank you for allowing the Palliative Medicine Team to assist in the care of this patient. Please utilize secure chat with additional questions, if there is no response within 30 minutes please call the above phone number. Palliative Medicine Team providers are available by phone from 7am to 5pm daily and can be reached through the team cell phone.  Should this patient require assistance outside of these hours, please call the patient's attending  physician.

## 2023-01-03 NOTE — ED Notes (Signed)
ED TO INPATIENT HANDOFF REPORT  ED Nurse Name and Phone #: [phone]  S Name/Age/Gender James Sweeney 82 y.o. male Room/Bed: 020C/020C  Code Status   Code Status: Full Code  Home/SNF/Other Home Patient oriented to: self, place, and time Is this baseline? Yes   Triage Complete: Triage complete  Chief Complaint Acute respiratory failure with hypoxia ([place]) [J96.01]  Triage Note Patient reported to have increased shortness of breath today.  He has hx of lymphoma.  He had thoracentesis with 1.5 Liters removed recently.  Patient is alert but noted to have resp distress at rest.  Son reports sats at home were in the 60's.  Patient with no reported fevers.  Patient is alert and oriented.     Allergies Allergies  Allergen Reactions   Hydrocodone-Acetaminophen Other (See Comments)    Suspected cause of confusion   Losartan Potassium-Hctz Nausea Only   Levaquin [Levofloxacin In D5w] Nausea Only   Penicillins Hives and Other (See Comments)    Has patient had a PCN reaction causing immediate rash, facial/tongue/throat swelling, SOB or lightheadedness with hypotension: unknown, childhood allergy Has patient had a PCN reaction causing severe rash involving mucus membranes or skin necrosis: No Has patient had a PCN reaction that required hospitalization No Has patient had a PCN reaction occurring within the last 10 years: No If all of the above answers are "NO", then may proceed with Cephalosporin use.     Level of Care/Admitting Diagnosis ED Disposition     ED Disposition  Admit   Condition  --   Comment  Hospital Area: [place] [id number]  Level of Care: ICU [6]  May admit patient to [facility] or [facility] if equivalent level of care is available:: Yes  Covid Evaluation: Symptomatic Person Under Investigation (PUI) or recent exposure (last 10 days) *Testing Required*  Diagnosis: Acute respiratory failure with hypoxia [facility]) [id number]  Admitting Physician:  Collier Bullock [id number]  Attending Physician: Collier Bullock [id number]  Certification:: I certify this patient will need inpatient services for at least 2 midnights  Estimated Length of Stay: 2          B Medical/Surgery History Past Medical History:  Diagnosis Date   Cervical spondylosis without myelopathy 12/01/2013   Diabetes mellitus    borderline   Enlarged prostate    Frequency of urination    Hearing deficit    Heart murmur    History of kidney stones    removed with Lithotripsy   HOH (hard of hearing)    right ear   Hyperlipemia    Hypertension    Lumbago 12/01/2013   Pneumonia 2016   [place]    right leg smaller than left   Shortness of breath dyspnea    with exertion   Past Surgical History:  Procedure Laterality Date   CATARACT EXTRACTION Bilateral 2014   IR THORACENTESIS ASP PLEURAL SPACE W/IMG GUIDE  12/22/2022   LUNG BIOPSY Right 2018   March   VIDEO ASSISTED THORACOSCOPY (VATS)/WEDGE RESECTION Right 04/12/2018   Procedure: VIDEO ASSISTED THORACOSCOPY (VATS), WEDGE RESECTION RIGHT MIDDLE LOBE, PLACEMENT OF ON Q CATHETER;  Surgeon: [name], MD;  Location: [place] [facility];  Service: Thoracic;  Laterality: Right;   VIDEO BRONCHOSCOPY N/A 04/12/2018   Procedure: VIDEO BRONCHOSCOPY;  Surgeon: [name], MD;  Location: [place];  Service: Thoracic;  Laterality: N/A;   VIDEO BRONCHOSCOPY WITH ENDOBRONCHIAL NAVIGATION N/A 01/25/2016   Procedure: VIDEO BRONCHOSCOPY WITH ENDOBRONCHIAL NAVIGATION;  Surgeon: Lilia Argue  Servando Snare, MD;  Location: [place];  Service: Thoracic;  Laterality: N/A;   VIDEO BRONCHOSCOPY WITH ENDOBRONCHIAL ULTRASOUND N/A 01/25/2016   Procedure: VIDEO BRONCHOSCOPY WITH ENDOBRONCHIAL ULTRASOUND;  Surgeon: [name], MD;  Location: [place];  Service: Thoracic;  Laterality: N/A;     A IV Location/Drains/Wounds Patient Lines/Drains/Airways Status     Active Line/Drains/Airways     Name Placement date Placement time Site Days    Peripheral IV 01/02/23 20 G Anterior;Distal;Right;Upper Arm 01/02/23  2119  Arm  1   Peripheral IV 01/02/23 20 G 1" Anterior;Left;Upper Arm 01/02/23  2240  Arm  1            Intake/Output Last 24 hours  Intake/Output Summary (Last 24 hours) at 01/03/2023 0152 Last data filed at 01/02/2023 2340 Gross per 24 hour  Intake 100 ml  Output --  Net 100 ml    Labs/Imaging Results for orders placed or performed during the hospital encounter of 01/02/23 (from the past 48 hour(s))  CBC with Differential     Status: Abnormal   Collection Time: 01/02/23  9:20 PM  Result Value Ref Range   WBC 1.7 (L) 4.0 - 10.5 K/uL   RBC 1.49 (L) 4.22 - 5.81 MIL/uL   Hemoglobin 5.6 (LL) 13.0 - 17.0 g/dL    Comment: REPEATED TO VERIFY THIS CRITICAL RESULT HAS VERIFIED AND BEEN CALLED TO Oyuki Hogan RN BY BERTRAM Overland ON 03 01 2024 AT 2219, AND HAS BEEN READ BACK.     HCT 18.0 (L) 39.0 - 52.0 %   MCV 120.8 (H) 80.0 - 100.0 fL   MCH 37.6 (H) 26.0 - 34.0 pg   MCHC 31.1 30.0 - 36.0 g/dL   RDW Not Measured 11.5 - 15.5 %   Platelets 157 150 - 400 K/uL   nRBC 0.0 0.0 - 0.2 %   Neutrophils Relative % 97 %   Neutro Abs 1.6 (L) 1.7 - 7.7 K/uL   Lymphocytes Relative 3 %   Lymphs Abs 0.1 (L) 0.7 - 4.0 K/uL   Monocytes Relative 0 %   Monocytes Absolute 0.0 (L) 0.1 - 1.0 K/uL   Eosinophils Relative 0 %   Eosinophils Absolute 0.0 0.0 - 0.5 K/uL   Basophils Relative 0 %   Basophils Absolute 0.0 0.0 - 0.1 K/uL   nRBC 0 0 /100 WBC   Abs Immature Granulocytes 0.00 0.00 - 0.07 K/uL   Tear Drop Cells PRESENT    Burr Cells PRESENT    Polychromasia PRESENT     Comment: Performed at [facility], [place] [address] [place], [place] [id number]  Basic metabolic panel     Status: Abnormal   Collection Time: 01/02/23  9:20 PM  Result Value Ref Range   Sodium 134 (L) 135 - 145 mmol/L   Potassium 4.1 3.5 - 5.1 mmol/L   Chloride 100 98 - 111 mmol/L   CO2 21 (L) 22 - 32 mmol/L   Glucose, Bld 173 (H) 70 - 99 mg/dL     Comment: Glucose reference range applies only to samples taken after fasting for at least 8 hours.   BUN 23 8 - 23 mg/dL   Creatinine, Ser 1.47 (H) 0.61 - 1.24 mg/dL   Calcium 8.6 (L) 8.9 - 10.3 mg/dL   GFR, Estimated 48 (L) >60 mL/min    Comment: (NOTE) Calculated using the CKD-EPI Creatinine Equation (2021)    Anion gap 13 5 - 15    Comment: Performed at [facility]  Lab, [address] [place], [place] [address]  Lactic acid, plasma     Status: Abnormal   Collection Time: 01/02/23  9:20 PM  Result Value Ref Range   Lactic Acid, Venous 4.5 (HH) 0.5 - 1.9 mmol/L    Comment: CRITICAL RESULT CALLED TO, READ BACK BY AND VERIFIED WITH Samuella Cota RN 01/02/23 2232 Wiliam Ke Performed at [facility] [address], [place], [place] [address]   Brain natriuretic peptide     Status: Abnormal   Collection Time: 01/02/23  9:20 PM  Result Value Ref Range   B Natriuretic Peptide 536.5 (H) 0.0 - 100.0 pg/mL    Comment: Performed at [facility] [address], [place], [place] [address]  Troponin I (High Sensitivity)     Status: Abnormal   Collection Time: 01/02/23  9:20 PM  Result Value Ref Range   Troponin I (High Sensitivity) 56 (H) <18 ng/L    Comment: (NOTE) Elevated high sensitivity troponin I (hsTnI) values and significant  changes across serial measurements may suggest ACS but many other  chronic and acute conditions are known to elevate hsTnI results.  Refer to the "Links" section for chest pain algorithms and additional  guidance. Performed at [facility], [place] [address] [place], [place] [address]   I-Stat venous blood gas, ED     Status: Abnormal   Collection Time: 01/02/23  9:32 PM  Result Value Ref Range   pH, Ven 7.380 7.25 - 7.43   pCO2, Ven 37.3 (L) 44 - 60 mmHg   pO2, Ven 31 (LL) 32 - 45 mmHg   Bicarbonate 22.1 20.0 - 28.0 mmol/L   TCO2 23 22 - 32 mmol/L   O2 Saturation 59 %   Acid-base deficit 3.0 (H) 0.0 - 2.0 mmol/L   Sodium  138 135 - 145 mmol/L   Potassium 4.4 3.5 - 5.1 mmol/L   Calcium, Ion 1.13 (L) 1.15 - 1.40 mmol/L   HCT 19.0 (L) 39.0 - 52.0 %   Hemoglobin 6.5 (LL) 13.0 - 17.0 g/dL   Sample type VENOUS    Comment NOTIFIED PHYSICIAN   Type and screen [place]     Status: None (Preliminary result)   Collection Time: 01/02/23 10:40 PM  Result Value Ref Range   ABO/RH(D) O POS    Antibody Screen NEG    Sample Expiration 01/05/2023,2359    Unit Number [id number]    Blood Component Type RED CELLS,LR    Unit division 00    Status of Unit ISSUED    Transfusion Status OK TO TRANSFUSE    Crossmatch Result      Compatible Performed at [facility], [address] [place], [place] [address]    Unit Number [id number]    Blood Component Type RED CELLS,LR    Unit division 00    Status of Unit ALLOCATED    Transfusion Status OK TO TRANSFUSE    Crossmatch Result Compatible   Troponin I (High Sensitivity)     Status: Abnormal   Collection Time: 01/02/23 10:40 PM  Result Value Ref Range   Troponin I (High Sensitivity) 108 (HH) <18 ng/L    Comment: DELTA CHECK NOTED CRITICAL RESULT CALLED TO, READ BACK BY AND VERIFIED WITH M. Lavette Yankovich, 0005, 01/03/23, EADEDOKUN (NOTE) Elevated high sensitivity troponin I (hsTnI) values and significant  changes across serial measurements may suggest ACS but many other  chronic and acute conditions are known to elevate hsTnI  results.  Refer to the "Links" section for chest pain algorithms and additional  guidance. Performed at [facility], [place] [address], [place], [place] [address]   Prepare RBC (crossmatch)     Status: None   Collection Time: 01/02/23 11:00 PM  Result Value Ref Range   Order Confirmation      ORDER PROCESSED BY BLOOD BANK Performed at [facility], [place] [address], [place], [place] [address]    DG Chest Portable 1 View  Result Date: 01/02/2023 CLINICAL DATA:  Shortness of breath EXAM: PORTABLE CHEST 1 VIEW  COMPARISON:  12/22/2022 FINDINGS: Cardiac shadow is at the upper limits of normal in size but stable. Aortic calcifications are noted. Persistent bibasilar airspace opacities are noted. Small bilateral pleural effusions are noted slightly worsened when compared with the prior exam. No pneumothorax is seen. No bony abnormality is noted. IMPRESSION: Bibasilar airspace opacities with associated effusions. Electronically Signed   By: Inez Catalina M.D.   On: 01/02/2023 21:16    Pending Labs Unresulted Labs (From admission, onward)     Start     Ordered   01/03/23 0500  Comprehensive metabolic panel  Tomorrow morning,   R        01/03/23 0120   01/03/23 0500  Phosphorus  Tomorrow morning,   R        01/03/23 0120   01/03/23 0500  CBC  Tomorrow morning,   R        01/03/23 0120   01/03/23 0149  Respiratory (~20 pathogens) panel by PCR  (Respiratory panel by PCR (~20 pathogens, ~24 hr TAT)  w precautions)  Once,   R        01/03/23 0148   01/03/23 0148  Expectorated Sputum Assessment w Gram Stain, Rflx to Resp Cult  Once,   R        01/03/23 0148   01/03/23 0123  Protime-INR  Once,   R        01/03/23 0122   01/03/23 0123  APTT  Once,   R        01/03/23 0122   01/03/23 0121  Urinalysis, Routine w reflex microscopic -Urine, Clean Catch  Once,   R       Question:  Specimen Source  Answer:  Urine, Clean Catch   01/03/23 0120   01/03/23 0121  Strep pneumoniae urinary antigen (not at [facility])  Once,   R        01/03/23 0120   01/03/23 0121  Legionella Pneumophila Serogp 1 Ur Ag  Once,   R        01/03/23 0120   01/03/23 0120  Magnesium  Once,   R        01/03/23 0120   01/03/23 0120  Lactic acid, plasma  STAT Now then every 3 hours,   R      01/03/23 0120   01/03/23 0120  Procalcitonin  Once,   R       References:    Procalcitonin Lower Respiratory Tract Infection AND Sepsis Procalcitonin Algorithm   01/03/23 0120   01/03/23 0037  Resp panel by RT-PCR (RSV, Flu A&B, Covid) Anterior Nasal Swab   (Tier 2 - SymptomaticResp panel by RT-PCR (RSV, Flu A&B, Covid))  Once,   URGENT        01/03/23 0036   01/02/23 2053  Blood gas, venous (at [facility] and [facility])  Once,   R  01/02/23 2053   01/02/23 2052  Blood culture (routine x 2)  BLOOD CULTURE X 2,   R      01/02/23 2053            Vitals/Pain Today's Vitals   01/03/23 0015 01/03/23 0030 01/03/23 0035 01/03/23 0050  BP: (!) 194/181 (!) 166/82 (!) 166/82 117/75  Pulse: 76 78 74 82  Resp: (!) 22 (!) 26 20 (!) 24  Temp:   98.2 F (36.8 C) 97.9 F (36.6 C)  TempSrc:    Oral  SpO2: 92% 99%  94%  Weight:      Height:        Isolation Precautions Droplet precaution  Medications Medications  0.9 %  sodium chloride infusion (Manually program via Guardrails IV Fluids) (has no administration in time range)  furosemide (LASIX) injection 40 mg (has no administration in time range)  docusate sodium (COLACE) capsule 100 mg (has no administration in time range)  polyethylene glycol (MIRALAX / GLYCOLAX) packet 17 g (has no administration in time range)  acetaminophen (TYLENOL) tablet 650 mg (has no administration in time range)  ipratropium-albuterol (DUONEB) 0.5-2.5 (3) MG/3ML nebulizer solution 3 mL (has no administration in time range)  cefTRIAXone (ROCEPHIN) 2 g in sodium chloride 0.9 % 100 mL IVPB (has no administration in time range)  azithromycin (ZITHROMAX) 500 mg in sodium chloride 0.9 % 250 mL IVPB (has no administration in time range)  ceFEPIme (MAXIPIME) 2 g in sodium chloride 0.9 % 100 mL IVPB (0 g Intravenous Stopped 01/02/23 2340)  vancomycin (VANCOREADY) IVPB 1500 mg/300 mL (1,500 mg Intravenous New Bag/Given 01/02/23 2345)  lactated ringers bolus 500 mL (500 mLs Intravenous New Bag/Given 01/02/23 2340)    Mobility walks with device     Focused Assessments Cardiac Assessment Handoff:    Lab Results  Component Value Date   CKTOTAL 142 01/05/2022   Lab Results  Component Value Date   DDIMER <0.27 09/18/2019   Does  the Patient currently have chest pain? No   , Pulmonary Assessment Handoff:  Lung sounds: Bilateral Breath Sounds: Fine crackles O2 Device: High Flow Nasal Cannula O2 Flow Rate (L/min): 10 L/min    R Recommendations: See Admitting Provider Note  Report given to:   Additional Notes:

## 2023-01-03 NOTE — Progress Notes (Signed)
PT Cancellation Note  Patient Details Name: MARVENS BEAS MRN: [id number] DOB: 1940-11-07   Cancelled Treatment:    Reason Eval/Treat Not Completed: Medical issues which prohibited therapy  Patient now on BiPap and being watched for possible intubation. Will follow and proceed with evaluation when appropriate.    [place]  Office [phone]  Rexanne Mano 01/03/2023, 7:49 AM

## 2023-01-03 NOTE — Plan of Care (Signed)

## 2023-01-03 NOTE — ED Notes (Signed)
Pt receiving blood '@lab'$  2:04am

## 2023-01-03 NOTE — Progress Notes (Signed)
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  Recent hospitalization with treatment for pneumonia.  Had recent thoracentesis.  Presently with increased work of breathing, hgb 6.5, wbc 1.7.  cxr with bilat infiltrates more dense in bases.  Some pleural changes noted on the right.  Lasix and abx given.  eICU Interventions  Starting bipap for increased work of breathing and hypoxia.  At risk of intubation.  May need to broaden abx temporarily and follow cultures     Intervention Category Evaluation Type: New Patient Evaluation  James Sweeney, P 01/03/2023, 3:03 AM

## 2023-01-03 NOTE — H&P (Addendum)
NAME:  James Sweeney, MRN:  [id number], DOB:  1941/08/14, LOS: 0 ADMISSION DATE:  01/02/2023, CONSULTATION DATE:  3/2 REFERRING MD:  Dr. 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  James Sweeney recently admitted on 11/07/2022 with similar episode of hypoxic respiratory failure with sats in the 70s on home 3L O2.  James Sweeney found to have a right pleural effusion.  Hypoxia improved with thoracentesis.  Treated for possible CAP and was discharged on 1/7.  On 2/19 James Sweeney had another thoracentesis performed with 1.2L drained.  Around 2/28 James Sweeney with worsening respiratory symptoms.  On 3/1 James Sweeney's grandson get cell James Sweeney was short of breath over the phone.  Found James Sweeney with sats 64% on 3 LNC.  James Sweeney was brought to [facility] ED for further eval.  Upon arrival to [facility] ED on 3/1, James Sweeney placed on salter HFNC with improvement in sats.  James Sweeney with expiratory grunting breathing with RR 30s.  James Sweeney states he feels okay.  Breathing worse with exertion and laying flat.  Afebrile.  James Sweeney started on broad-spectrum antibiotics.  CXR with bibasilar airspace opacities with associated effusions.  VBG 7.38, 37, 31, 22.  COVID/flu/RSV pending.  PCCM consulted for ICU admission.  Pertinent ED labs: NA 134, creatinine 1.47, LA 4.5, BNP 536, troponin 56 then 108, WBC 1.7, Hgb 5.6, HCT 18.  Pertinent  Medical History   Past Medical History:  Diagnosis Date   Cervical spondylosis without myelopathy 12/01/2013   Diabetes mellitus    borderline   Enlarged prostate    Frequency of urination    Hearing deficit    Heart murmur     History of kidney stones    removed with Lithotripsy   HOH (hard of hearing)    right ear   Hyperlipemia    Hypertension    Lumbago 12/01/2013   Pneumonia 2016   Polio    right leg smaller than left   Shortness of breath dyspnea    with exertion     Significant Hospital Events: Including procedures, antibiotic start and stop dates in addition to other pertinent events   3/1 admitted to [facility] ED with hypoxic respiratory failure; PCCM consulted for ICU admission  Interim History / Subjective:  See above  Objective   Blood pressure (!) 144/60, pulse 77, resp. rate (!) 28, height '5\' 10"'$  (1.778 m), weight 79 kg, SpO2 99 %.        Intake/Output Summary (Last 24 hours) at 01/03/2023 [id number] Last data filed at 01/02/2023 2340 Gross per 24 hour  Intake 100 ml  Output --  Net 100 ml   Filed Weights   01/02/23 2059  Weight: 79 kg    Examination: General:   ill appearing elderly male w/ mild respiratory distress HEENT: MM pink/moist; salter [place] in place Neuro: Hard of hearing; AO; MAE CV: s1s2, irregularly irregular rate in 80s, no m/r/g PULM:  dim clear BS bilaterally; salter HFNC 8L GI: soft, bsx4 active  Extremities: warm/dry, BLE L>R edema  Skin: no rashes or lesions appreciated   Resolved Hospital Problem list     Assessment & Plan:  Acute on chronic respiratory failure with hypoxia: On  2-3 LNC at home Possible CAP Recurrent right pleural effusion: Right pleural effusion relatively unchanged since recent cxr post-thoracentesis on 2/19 Chronic HFpEF Plan: -Wean salter HFNC for sats greater than 92% -unable to lay flat for cta chest to rule out PE; check dvt US; heparin gtt -COVID/flu/RSV pending -Check expectorated sputum, urine Legionella/strep, PCT, RVP -Rocephin/azithromycin for CAP Ppx -pulm toiletry: is/flutter -pt/ot -lasix x1  R>L LE swelling -James Sweeney states he is compliant with his home Xarelto Plan: -dvt US -unable to lay flat for cta chest -heparin gtt  for afib  -DVT ultrasound  Sepsis: possible CAP Plan: -ABX as above -Trend LA -COVID/flu/RSV pending -f/u bcx2 and UA -Check expectorated sputum, urine Legionella/strep, PCT, RVP -Trend WBC/fever curve  NSTEMI type 2 Elevated BNP Plan: -trend troponin -lasix x1  A-fib with RVR Plan: -Telemetry monitoring -Will start on heparin gtt -Continue home diltiazem  Marginal zone lymphoma/MALT lymphoma -James Sweeney refuses further evaluation with bone marrow biopsy Plan: -Palliative care consulted  Symptomatic anemia Pancytopenia Plan: -2 units PRBCs currently being transfused -Check CBC posttransfusion -Trend CBC  HTN HLD Plan: -Resume home statin -Continue diltiazem; hold valsartan for now  CKD stage IIIa -Baseline creat 1.2-1.4 Plan: -Trend BMP / urinary output -Replace electrolytes as indicated -Avoid nephrotoxic agents, ensure adequate renal perfusion  Diet controlled T2DM Plan: -CBG monitoring  BPH Plan: -Resume home finasteride and alfuzosin   Best Practice (right click and "Reselect all SmartList Selections" daily)   Diet/type: clear liquids DVT prophylaxis: systemic heparin GI prophylaxis: N/A Lines: N/A Foley:  N/A Code Status:  full code Last date of multidisciplinary goals of care discussion [3/2 spoke with grandson and James Sweeney at bedside with Dr. Patsey Berthold; James Sweeney and grandson would like to continue full code for now but have been discussing this a lot lately considering James Sweeney does not want to seek further treatment for lymphoma.  Will consult palliative care to assist with [place] discussions.]  Labs   CBC: Recent Labs  Lab 01/02/23 2120 01/02/23 2132  WBC 1.7*  --   NEUTROABS 1.6*  --   HGB 5.6* 6.5*  HCT 18.0* 19.0*  MCV 120.8*  --   PLT 157  --     Basic Metabolic Panel: Recent Labs  Lab 01/02/23 2120 01/02/23 2132  NA 134* 138  K 4.1 4.4  CL 100  --   CO2 21*  --   GLUCOSE 173*  --   BUN 23  --   CREATININE 1.47*  --    CALCIUM 8.6*  --    GFR: Estimated Creatinine Clearance: 40.7 mL/min (A) (by C-G formula based on SCr of 1.47 mg/dL (H)). Recent Labs  Lab 01/02/23 2120  WBC 1.7*  LATICACIDVEN 4.5*    Liver Function Tests: No results for input(s): "AST", "ALT", "ALKPHOS", "BILITOT", "PROT", "ALBUMIN" in the last 168 hours. No results for input(s): "LIPASE", "AMYLASE" in the last 168 hours. No results for input(s): "AMMONIA" in the last 168 hours.  ABG    Component Value Date/Time   PHART 7.402 04/13/2018 0535   PCO2ART 40.6 04/13/2018 0535   PO2ART 65.0 (L) 04/13/2018 0535   HCO3 22.1 01/02/2023 2132   TCO2 23 01/02/2023 2132   ACIDBASEDEF 3.0 (H) 01/02/2023 2132   O2SAT 59 01/02/2023 2132     Coagulation Profile: No results for input(s): "INR", "PROTIME" in the last 168 hours.  Cardiac Enzymes: No results for input(s): "CKTOTAL", "CKMB", "CKMBINDEX", "TROPONINI" in the last 168 hours.  HbA1C: Hgb A1c MFr Bld  Date/Time Value Ref  Range Status  01/05/2022 08:27 PM 5.3 4.8 - 5.6 % Final    Comment:    (NOTE) Pre diabetes:          5.7%-6.4%  Diabetes:              >6.4%  Glycemic control for   <7.0% adults with diabetes   04/08/2018 03:31 PM 6.3 (H) 4.8 - 5.6 % Final    Comment:    (NOTE) Pre diabetes:          5.7%-6.4% Diabetes:              >6.4% Glycemic control for   <7.0% adults with diabetes     CBG: No results for input(s): "GLUCAP" in the last 168 hours.  Review of Systems:   Review of Systems  Constitutional:  Negative for fever.  Respiratory:  Positive for shortness of breath. Negative for cough, sputum production and wheezing.   Cardiovascular:  Negative for chest pain.  Gastrointestinal:  Negative for abdominal pain, blood in stool, diarrhea, nausea and vomiting.     Past Medical History:  He,  has a past medical history of Cervical spondylosis without myelopathy (12/01/2013), Diabetes mellitus, Enlarged prostate, Frequency of urination, Hearing  deficit, Heart murmur, History of kidney stones, HOH (hard of hearing), Hyperlipemia, Hypertension, Lumbago (12/01/2013), Pneumonia (2016), Polio, and Shortness of breath dyspnea.   Surgical History:   Past Surgical History:  Procedure Laterality Date   CATARACT EXTRACTION Bilateral 2014   IR THORACENTESIS ASP PLEURAL SPACE W/IMG GUIDE  12/22/2022   LUNG BIOPSY Right 2018   March   VIDEO ASSISTED THORACOSCOPY (VATS)/WEDGE RESECTION Right 04/12/2018   Procedure: VIDEO ASSISTED THORACOSCOPY (VATS), WEDGE RESECTION RIGHT MIDDLE LOBE, PLACEMENT OF ON Q CATHETER;  Surgeon: [name], MD;  Location: [place];  Service: Thoracic;  Laterality: Right;   VIDEO BRONCHOSCOPY N/A 04/12/2018   Procedure: VIDEO BRONCHOSCOPY;  Surgeon: [name], MD;  Location: [place];  Service: Thoracic;  Laterality: N/A;   VIDEO BRONCHOSCOPY WITH ENDOBRONCHIAL NAVIGATION N/A 01/25/2016   Procedure: VIDEO BRONCHOSCOPY WITH ENDOBRONCHIAL NAVIGATION;  Surgeon: [name], MD;  Location: [facility];  Service: Thoracic;  Laterality: N/A;   VIDEO BRONCHOSCOPY WITH ENDOBRONCHIAL ULTRASOUND N/A 01/25/2016   Procedure: VIDEO BRONCHOSCOPY WITH ENDOBRONCHIAL ULTRASOUND;  Surgeon: [name], MD;  Location: [place];  Service: Thoracic;  Laterality: N/A;     Social History:   reports that he quit smoking about 55 years ago. His smoking use included cigarettes. He has a 4.50 pack-year smoking history. He quit smokeless tobacco use about 24 years ago.  His smokeless tobacco use included chew. He reports that he does not drink alcohol and does not use drugs.   Family History:  His family history includes Cancer in his mother; Cancer - Lung in his father; Diabetes in his brother; Heart attack in his brother.   Allergies Allergies  Allergen Reactions   Hydrocodone-Acetaminophen Other (See Comments)    Suspected cause of confusion   Losartan Potassium-Hctz Nausea Only   Levaquin [Levofloxacin In D5w] Nausea Only    Penicillins Hives and Other (See Comments)    Has James Sweeney had a PCN reaction causing immediate rash, facial/tongue/throat swelling, SOB or lightheadedness with hypotension: unknown, childhood allergy Has James Sweeney had a PCN reaction causing severe rash involving mucus membranes or skin necrosis: No Has James Sweeney had a PCN reaction that required hospitalization No Has James Sweeney had a PCN reaction occurring within the last 10 years: No If all  of the above answers are "NO", then may proceed with Cephalosporin use.      Home Medications  Prior to Admission medications   Medication Sig Start Date End Date Taking? Authorizing Provider  alfuzosin (UROXATRAL) 10 MG 24 hr tablet Take 10 mg by mouth at bedtime.     [provider]  Ascorbic Acid (SUPER C COMPLEX PO) Take 1 capsule by mouth every evening. Super C 1000    [provider]  atorvastatin (LIPITOR) 20 MG tablet TAKE 1 TABLET BY MOUTH EVERY DAY James Sweeney taking differently: Take 20 mg by mouth daily. 09/02/21   Croitoru, Mihai, MD  Cholecalciferol (VITAMIN D) 125 MCG (5000 UT) CAPS Take 5,000 Units by mouth daily.    [provider]  Cyanocobalamin (VITAMIN B-12) 5000 MCG SUBL Place 5,000 mcg under the tongue every evening.    [provider]  diltiazem (CARDIZEM CD) 120 MG 24 hr capsule Take 1 capsule (120 mg total) by mouth daily. 01/11/22   Little Ishikawa, MD  Ferrous Sulfate (IRON) 28 MG TABS Take 28 mg by mouth 3 (three) times a week.    [provider]  finasteride (PROSCAR) 5 MG tablet Take 5 mg by mouth at bedtime.  05/16/15   [provider]  fluticasone (FLONASE) 50 MCG/ACT nasal spray Place 2 sprays into both nostrils daily. James Sweeney taking differently: Place 2 sprays into both nostrils daily as needed for allergies. 01/11/22 11/08/22  Little Ishikawa, MD  furosemide (LASIX) 20 MG tablet Take 1 tablet (20 mg total) by mouth daily. 11/09/22 02/07/23  [place] Eric J, DO   guaiFENesin-dextromethorphan (ROBITUSSIN DM) 100-10 MG/5ML syrup Take 15 mLs by mouth every 4 (four) hours as needed for cough. 01/10/22   Little Ishikawa, MD  hydrocortisone (ANUSOL-HC) 25 MG suppository Place 1 suppository (25 mg total) rectally 2 (two) times daily. James Sweeney taking differently: Place 25 mg rectally 2 (two) times daily as needed for hemorrhoids. 05/08/20   Brunetta Genera, MD  loratadine (CLARITIN) 10 MG tablet Take 1 tablet (10 mg total) by mouth daily. 01/11/22   Little Ishikawa, MD  Omega-3 Fatty Acids (FISH OIL) 1200 MG CAPS Take 1,200 mg by mouth 3 (three) times a week.    [provider]  OVER THE COUNTER MEDICATION Take 2 tablets by mouth daily. [place] Immune enhancement    [provider]  OVER THE COUNTER MEDICATION Take 20 mLs by mouth daily. Colloidal Silver liquid for Immune booster    [provider]  Polyethyl Glycol-Propyl Glycol 0.4-0.3 % SOLN Place 1 drop into both eyes 2 (two) times daily as needed (for dry/irritated eyes.).    [provider]  rivaroxaban (XARELTO) 20 MG TABS tablet TAKE ONE TABLET BY MOUTH EVERYDAY AT BEDTIME James Sweeney taking differently: Take 20 mg by mouth every evening. 08/04/22   Croitoru, Mihai, MD  traMADol (ULTRAM) 50 MG tablet Take 1 tablet (50 mg total) by mouth every 6 (six) hours as needed (pain). James Sweeney taking differently: Take 25 mg by mouth every 6 (six) hours as needed for moderate pain (pain). 07/10/22   Croitoru, Mihai, MD  valsartan (DIOVAN) 80 MG tablet Take 80 mg by mouth daily. 07/04/22   [provider]     Critical care time: 45 minutes     JD Geryl [facility] 01/03/2023, 12:28 AM  Please see [email or web address] for pager details.  From 7A-7P if no response, please call [phone]. After hours, please call ELink  [phone].    

## 2023-01-04 ENCOUNTER — Inpatient Hospital Stay (HOSPITAL_COMMUNITY): Payer: Medicare PPO

## 2023-01-04 DIAGNOSIS — Z7189 Other specified counseling: Secondary | ICD-10-CM | POA: Diagnosis not present

## 2023-01-04 DIAGNOSIS — Z66 Do not resuscitate: Secondary | ICD-10-CM | POA: Diagnosis not present

## 2023-01-04 DIAGNOSIS — J9601 Acute respiratory failure with hypoxia: Secondary | ICD-10-CM | POA: Diagnosis not present

## 2023-01-04 DIAGNOSIS — J90 Pleural effusion, not elsewhere classified: Secondary | ICD-10-CM | POA: Diagnosis not present

## 2023-01-04 LAB — GLUCOSE, CAPILLARY
Glucose-Capillary: 117 mg/dL — ABNORMAL HIGH (ref 70–99)
Glucose-Capillary: 94 mg/dL (ref 70–99)
Glucose-Capillary: 112 mg/dL — ABNORMAL HIGH (ref 70–99)
Glucose-Capillary: 172 mg/dL — ABNORMAL HIGH (ref 70–99)
Glucose-Capillary: 207 mg/dL — ABNORMAL HIGH (ref 70–99)
Glucose-Capillary: 115 mg/dL — ABNORMAL HIGH (ref 70–99)

## 2023-01-04 LAB — CBC
HCT: 20.3 % — ABNORMAL LOW (ref 39.0–52.0)
Hemoglobin: 7.1 g/dL — ABNORMAL LOW (ref 13.0–17.0)
MCH: 36.8 pg — ABNORMAL HIGH (ref 26.0–34.0)
MCHC: 35 g/dL (ref 30.0–36.0)
MCV: 105.2 fL — ABNORMAL HIGH (ref 80.0–100.0)
Platelets: 114 10*3/uL — ABNORMAL LOW (ref 150–400)
RBC: 1.93 MIL/uL — ABNORMAL LOW (ref 4.22–5.81)
RDW: 24.4 % — ABNORMAL HIGH (ref 11.5–15.5)
WBC: 1.8 10*3/uL — ABNORMAL LOW (ref 4.0–10.5)
nRBC: 0 % (ref 0.0–0.2)

## 2023-01-04 MED ORDER — RIVAROXABAN 20 MG PO TABS: 20.0000 mg | ORAL_TABLET | Freq: Every day | ORAL | Status: DC

## 2023-01-04 NOTE — Progress Notes (Signed)
Palliative Medicine Inpatient Follow Up Note     Chart Reviewed. Patient assessed at the bedside. He is transferring to the recliner from bed with PT. James Sweeney and patient's wife are at bedside. No acute distress.   James Sweeney tolerates transfer. Reports feeling "okay" sitting up in recliner. We were able to adjust oximeter and allow him to place his ball cap on as desired.   I spoke with James Sweeney (grandson/HCPOA) at the bedside along with patient and wife. Reviewed our goals of care discussions from yesterday and answered all questions. James Sweeney confirms family has discussed at length and made appropriate mutual decision to allow patient to return home with outpatient hospice support allowing him to spend with time he has left amongst his family and friends.  They understand the goal would be for no rehospitalizations unless hospice determines all needs cannot be met in the home or at their facility.  Patient had initially been placed on outpatient palliative waitlist with [facility].  She has confirmed family would like to pursue hospice services with their agency if possible.  Education provided on referral process.  We discussed need for home DME.  Patient will require home transportation and wheelchair.  We discussed patient's CODE STATUS.  James Sweeney expressed concerns for DNR/DNI however after further discussions he is aware patient would not be allowed to suffer in the home and medical attention would be provided offering needed interventions in scenario such as dyspnea or uncontrolled pain etc. he verbalized understanding and expressed comfort and peace with DNR/DNI decisions.  Education provided on MOLST form. Form completed as requested. The patient and family outlined their wishes for the following treatment decisions:  Cardiopulmonary Resuscitation: Do Not Attempt Resuscitation (DNR/No CPR)  Medical Interventions: Comfort Measures: Keep clean, warm, and dry. Use  medication by any route, positioning, wound care, and other measures to relieve pain and suffering. Use oxygen, suction and manual treatment of airway obstruction as needed for comfort. Do not transfer to the hospital unless comfort needs cannot be met in current location.  Antibiotics: Determine use of limitation of antibiotics when infection occurs  IV Fluids: No IV fluids (provide other measures to ensure comfort)  Feeding Tube: No feeding tube    Questions addressed and support provided.    Objective Assessment: Vital Signs Vitals:   01/04/23 0600 01/04/23 0740  BP: (!) 142/64   Pulse: 79   Resp: 15   Temp:  98.5 F (36.9 C)  SpO2: 99%     Intake/Output Summary (Last 24 hours) at 01/04/2023 1104 Last data filed at 01/04/2023 0300 Gross per 24 hour  Intake --  Output 900 ml  Net -900 ml   Last Weight  Most recent update: 01/04/2023  6:54 AM    Weight  80.1 kg (176 lb 9.4 oz)            Gen:  NAD, sitting up comfortably in recliner CV: Irregular  PULM: HFNC, diminished bilatearlly  ABD: soft/nontender/nondistended/normal bowel sounds EXT: bilateral lower extremity edema Neuro: Alert and oriented x3, hard of hearing  SUMMARY OF RECOMMENDATIONS   DNR/DNI/MOST form completed (see above). Original placed on chart for discharge. Continue with current plan of care per medical team. No aggressive interventions or escalation in care.  Family confirms wishes to discharge home with outpatient hospice support. Family is requesting [facility] as choice. Per grandson, patient was being placed on their palliative waiting list. Education provided on referral process. Will need wheelchair and  home transport at discharge. Family hopeful he can return home soon.  PMT will continue to support and follow on as needed basis. Please secure chat for urgent needs.   Time Total: 45 min   Visit consisted of counseling and education dealing with the complex and emotionally intense  issues of symptom management and palliative care in the setting of serious and potentially life-threatening illness.Greater than 50%  of this time was spent counseling and coordinating care related to the above assessment and plan.  James Sweeney, AGPCNP-BC  [place]  [phone]  Palliative Medicine Team providers are available by phone from 7am to 7pm daily and can be reached through the team cell phone. Should this patient require assistance outside of these hours, please call the patient's attending physician.*Please note that this is a verbal dictation therefore any spelling or grammatical errors are due to the "[employer]" system interpretation.

## 2023-01-04 NOTE — Progress Notes (Addendum)
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   Patient was admitted with acute on chronic hypoxemic respiratory failure due to possible community-acquired pneumonia with likely sepsis on admission.  He was also noted to have recurrent right-sided pleural effusion and has undergone thoracentesis 3/2.  He had associated atrial fibrillation with RVR and type II NSTEMI.  Palliative consulted for goals of care is overall poor prognosis noted.  Assessment & Plan:   Principal Problem:   Acute respiratory failure with hypoxia (HCC) Active Problems:   Pleural effusion on right  Assessment and Plan:   Acute on chronic hypoxemic respiratory failure with possible community-acquired pneumonia and recurrent right pleural effusion Status postthoracentesis 3/2 Appears to be weaning down to his usual baseline oxygen requirement Continue current treatment  Chronic HFpEF Currently stable Plan to resume lasix at home  R>L LE edema DVT ultrasound study pending  Sepsis with possible CAP Negative for flu/RSV/COVID Continue antibiotics as prescribed with azithromycin and Rocephin d3/7  NSTEMI type II with elevated BNP  Atrial fibrillation with RVR Continue to hold anticoagulation for now and monitor CBC  Marginal zone lymphoma/MALT lymphoma Patient refuses further evaluation with bone marrow biopsy Appreciate palliative care consultation  Symptomatic anemia/pancytopenia Status post 2 unit PRBC transfusion CBC with downward trend noted, continue to monitor Avoid heparin  agents given thrombocytopenia  Hypertension/dyslipidemia Home statin Continue diltiazem with valsartan being held  CKD stage IIIa Baseline creatinine 1.2-1.4 Trend BMP and urinary output  Diet controlled type 2 diabetes Continue CBG monitoring with adequate control noted  BPH Home finasteride and alfuzosin    DVT prophylaxis: SCDs Code Status: DNR Family Communication: Grandson at bedside 3/3 Disposition Plan: Anticipating DC home with hospice Status is: Inpatient Remains inpatient appropriate because: Need for IV medications.   Consultants:  Palliative PCCM  Procedures:  Thoracentesis 3/2  Antimicrobials:  Anti-infectives (From admission, onward)    Start     Dose/Rate Route Frequency Ordered Stop   01/03/23 2300  vancomycin (VANCOCIN) IVPB 1000 mg/200 mL premix  Status:  Discontinued        1,000 mg 200 mL/hr over 60 Minutes Intravenous Every 24 hours 01/02/23 2251 01/03/23 0150   01/03/23 1000  ceFEPIme (MAXIPIME) 2 g in sodium chloride 0.9 % 100 mL IVPB  Status:  Discontinued        2 g 200 mL/hr over 30 Minutes Intravenous Every 12 hours 01/02/23 2251 01/03/23 0150   01/03/23 0800  cefTRIAXone (ROCEPHIN) 2 g in sodium chloride 0.9 % 100 mL IVPB        2 g 200 mL/hr over 30 Minutes Intravenous Every 24 hours 01/03/23 0150     01/03/23 0300  azithromycin (ZITHROMAX) 500 mg in sodium chloride 0.9 % 250 mL IVPB        500 mg 250 mL/hr over 60 Minutes Intravenous Daily before breakfast 01/03/23 0150     01/02/23 2245  ceFEPIme (MAXIPIME) 2 g in sodium chloride 0.9 % 100 mL IVPB        2 g 200 mL/hr over 30 Minutes Intravenous  Once 01/02/23 2241 01/02/23  2340   01/02/23 2245  metroNIDAZOLE (FLAGYL) IVPB 500 mg  Status:  Discontinued        500 mg 100 mL/hr over 60 Minutes Intravenous  Once 01/02/23 2241 01/03/23 0254   01/02/23 2245  vancomycin (VANCOCIN) IVPB 1000 mg/200 mL premix  Status:  Discontinued        1,000 mg 200 mL/hr over 60 Minutes Intravenous   Once 01/02/23 2241 01/02/23 2244   01/02/23 2245  vancomycin (VANCOREADY) IVPB 1500 mg/300 mL        1,500 mg 150 mL/hr over 120 Minutes Intravenous  Once 01/02/23 2244 01/03/23 0147       Subjective: Patient seen and evaluated today with no new acute complaints or concerns. No acute concerns or events noted overnight.  Currently using bedside commode.  Objective: Vitals:   01/04/23 0300 01/04/23 0322 01/04/23 0400 01/04/23 0500  BP: (!) 162/71  (!) 150/71 (!) 158/79  Pulse: 86  86 82  Resp: '20  20 20  '$ Temp:  98.6 F (37 C)    TempSrc:  Oral    SpO2: 99%  96% 94%  Weight:      Height:        Intake/Output Summary (Last 24 hours) at 01/04/2023 0637 Last data filed at 01/04/2023 0300 Gross per 24 hour  Intake 115.42 ml  Output 1300 ml  Net -1184.58 ml   Filed Weights   01/02/23 2059 01/03/23 0704  Weight: 79 kg 79.3 kg    Examination:  General exam: Appears calm and comfortable  Respiratory system: Clear to auscultation. Respiratory effort normal.  6 L nasal cannula Cardiovascular system: S1 & S2 heard, RRR.  Gastrointestinal system: Abdomen is soft Central nervous system: Alert and awake Extremities: No edema Skin: No significant lesions noted Psychiatry: Flat affect.    Data Reviewed: I have personally reviewed following labs and imaging studies  CBC: Recent Labs  Lab 01/02/23 2120 01/02/23 2132 01/03/23 0337 01/03/23 0901 01/04/23 0021  WBC 1.7*  --  1.7* 1.6* 1.8*  NEUTROABS 1.6*  --   --   --   --   HGB 5.6* 6.5* 6.3* 7.4* 7.1*  HCT 18.0* 19.0* 19.1* 22.7* 20.3*  MCV 120.8*  --  113.7* 109.1* 105.2*  PLT 157  --  121* 125* [id number]*   Basic Metabolic Panel: Recent Labs  Lab 01/02/23 2120 01/02/23 2132 01/03/23 0337  NA 134* 138 137  K 4.1 4.4 4.3  CL 100  --  103  CO2 21*  --  19*  GLUCOSE 173*  --  177*  BUN 23  --  26*  CREATININE 1.47*  --  1.36*  CALCIUM 8.6*  --  8.6*  MG  --   --  1.9  PHOS  --   --  4.6   GFR: Estimated Creatinine  Clearance: 44 mL/min (A) (by C-G formula based on SCr of 1.36 mg/dL (H)). Liver Function Tests: Recent Labs  Lab 01/03/23 0337  AST 22  ALT 15  ALKPHOS 134*  BILITOT 1.8*  PROT 7.3  ALBUMIN 3.2*   No results for input(s): "LIPASE", "AMYLASE" in the last 168 hours. No results for input(s): "AMMONIA" in the last 168 hours. Coagulation Profile: Recent Labs  Lab 01/03/23 0337  INR 1.6*   Cardiac Enzymes: No results for input(s): "CKTOTAL", "CKMB", "CKMBINDEX", "TROPONINI" in the last 168 hours. BNP (last 3 results) No results for input(s): "PROBNP" in the last 8760 hours. HbA1C: No results for input(s): "HGBA1C" in the last  72 hours. CBG: Recent Labs  Lab 01/03/23 1107 01/03/23 1514 01/03/23 1913 01/03/23 2317 01/04/23 0307  GLUCAP 141* 163* 165* 123* 117*   Lipid Profile: No results for input(s): "CHOL", "HDL", "LDLCALC", "TRIG", "CHOLHDL", "LDLDIRECT" in the last 72 hours. Thyroid Function Tests: No results for input(s): "TSH", "T4TOTAL", "FREET4", "T3FREE", "THYROIDAB" in the last 72 hours. Anemia Panel: No results for input(s): "VITAMINB12", "FOLATE", "FERRITIN", "TIBC", "IRON", "RETICCTPCT" in the last 72 hours. Sepsis Labs: Recent Labs  Lab 01/02/23 2120 01/03/23 0337 01/03/23 0545  PROCALCITON  --  0.33  --   LATICACIDVEN 4.5* 2.7* 1.9    Recent Results (from the past 240 hour(s))  Blood culture (routine x 2)     Status: None (Preliminary result)   Collection Time: 01/02/23  9:20 PM   Specimen: BLOOD  Result Value Ref Range Status   Specimen Description BLOOD SITE NOT SPECIFIED  Final   Special Requests   Final    BOTTLES DRAWN AEROBIC AND ANAEROBIC Blood Culture adequate volume   Culture   Final    NO GROWTH < 12 HOURS Performed at [facility], [place] [address] [place], [place] [address]    Report Status PENDING  Incomplete  Blood culture (routine x 2)     Status: None (Preliminary result)   Collection Time: 01/02/23 10:40 PM   Specimen:  BLOOD  Result Value Ref Range Status   Specimen Description BLOOD SITE NOT SPECIFIED  Final   Special Requests   Final    BOTTLES DRAWN AEROBIC AND ANAEROBIC Blood Culture results may not be optimal due to an excessive volume of blood received in culture bottles   Culture   Final    NO GROWTH < 12 HOURS Performed at [facility], [place] [address], [place], [place] [address]    Report Status PENDING  Incomplete  Resp panel by RT-PCR (RSV, Flu A&B, Covid) Anterior Nasal Swab     Status: None   Collection Time: 01/03/23 12:43 AM   Specimen: Anterior Nasal Swab  Result Value Ref Range Status   SARS Coronavirus 2 by RT PCR NEGATIVE NEGATIVE Final   Influenza A by PCR NEGATIVE NEGATIVE Final   Influenza B by PCR NEGATIVE NEGATIVE Final    Comment: (NOTE) The Xpert Xpress SARS-CoV-2/FLU/RSV plus assay is intended as an aid in the diagnosis of influenza from Nasopharyngeal swab specimens and should not be used as a sole basis for treatment. Nasal washings and aspirates are unacceptable for Xpert Xpress SARS-CoV-2/FLU/RSV testing.  Fact Sheet for Patients: EntrepreneurPulse.com.au  Fact Sheet for Healthcare Providers: IncredibleEmployment.be  This test is not yet approved or cleared by the [place] FDA and has been authorized for detection and/or diagnosis of SARS-CoV-2 by FDA under an Emergency Use Authorization (EUA). This EUA will remain in effect (meaning this test can be used) for the duration of the COVID-19 declaration under Section 564(b)(1) of the Act, 21 U.S.C. section 360bbb-3(b)(1), unless the authorization is terminated or revoked.     Resp Syncytial Virus by PCR NEGATIVE NEGATIVE Final    Comment: (NOTE) Fact Sheet for Patients: EntrepreneurPulse.com.au  Fact Sheet for Healthcare Providers: IncredibleEmployment.be  This test is not yet approved or cleared by the [place] FDA  and has been authorized for detection and/or diagnosis of SARS-CoV-2 by FDA under an Emergency Use Authorization (EUA). This EUA will remain in effect (meaning this test can be used) for the duration of the COVID-19 declaration under Section 564(b)(1) of the Act,  21 U.S.C. section 360bbb-3(b)(1), unless the authorization is terminated or revoked.  Performed at [facility], [place] [address], [place], [place] [address]   Culture, body fluid w Gram Stain-bottle     Status: None (Preliminary result)   Collection Time: 01/03/23  8:13 AM   Specimen: Pleura  Result Value Ref Range Status   Specimen Description PLEURAL  Final   Special Requests LEFT SIDE LUNG  Final   Gram Stain   Final    CYTOSPIN SMEAR WBC PRESENT,BOTH PMN AND MONONUCLEAR NO ORGANISMS SEEN Performed at [facility], [place] [address] [place], [place] [address]    Culture PENDING  Incomplete   Report Status PENDING  Incomplete  Body fluid culture w Gram Stain     Status: None (Preliminary result)   Collection Time: 01/03/23  8:20 AM   Specimen: Pleural Fluid  Result Value Ref Range Status   Specimen Description PLEURAL  Final   Special Requests RIGHT SIDE LUNG  Final   Gram Stain   Final    CYTOSPIN SMEAR NO ORGANISMS SEEN NO WBC SEEN Performed at [facility], [address] [place], [place] [address]    Culture PENDING  Incomplete   Report Status PENDING  Incomplete         Radiology Studies: CT CHEST WO CONTRAST  Result Date: 01/03/2023 CLINICAL DATA:  Lymphoma.  Pleural mass. EXAM: CT CHEST WITHOUT CONTRAST TECHNIQUE: Multidetector CT imaging of the chest was performed following the standard protocol without IV contrast. RADIATION DOSE REDUCTION: This exam was performed according to the departmental dose-optimization program which includes automated exposure control, adjustment of the mA and/or kV according to patient size and/or use of iterative reconstruction technique. COMPARISON:  CT  chest dated 09/16/2021.  PET-CT dated 02/12/2021. FINDINGS: Cardiovascular: Cardiomegaly. No pericardial effusion. Hypodense blood pool relative to myocardium, suggesting anemia. No evidence of thoracic aortic aneurysm. Atherosclerotic calcifications of the aortic root/arch. Moderate three-vessel coronary atherosclerosis. Mediastinum/Nodes: Mediastinal lymphadenopathy, partially calcified, but raising concern for lymphoma in this clinical context. Dominant right paratracheal node measures 15 mm short axis (series 3/image 56) and dominant subcarinal node measures 1.9 cm (series 3/image 74), both of which are progressive from the prior. Visualized thyroid is unremarkable. Lungs/Pleura: Multifocal patchy opacities in the lungs bilaterally, left upper lobe predominant, suspicious for pneumonia. Small left pleural effusion. Moderate right pleural effusion, partially loculated with thickened rim, compatible with chronic pleural effusion/empyema. Associated trace gas related to recent thoracentesis. 6.8 x 3.4 cm pleural-based mass in the anterior right hemithorax (series 3/image 83), with associated postsurgical changes, previously 5.6 x 1.9 cm. Adjacent 19 x 16 mm right upper lobe nodule (series 5/image 76), previously 18 x 14 mm, favoring pulmonary lymphoma. Additional 2.0 x 1.6 cm nodule in the superior segment left lower lobe (series 5/image 46), previously 2.3 x 1.5 cm, also suspicious and grossly unchanged. New 7.6 x 4.3 cm rounded masslike opacity in the right lower lobe (series 3/image 102), possibly rounded atelectasis, although additional pleural base mass is not excluded. No pneumothorax. Upper Abdomen: Visualized upper abdomen is notable for hepatic cysts, vascular calcifications, and a stable 2.4 cm right adrenal nodule which demonstrates mild hypermetabolism on prior PET and favors a benign adrenal adenoma. Musculoskeletal: Degenerative changes of the visualized thoracolumbar spine. IMPRESSION: Moderate right  pleural effusion, partially loculated with thickened rim, compatible with chronic pleural effusion/empyema. Associated trace gas related to recent thoracentesis. Multifocal patchy opacities in the lungs bilaterally, left upper lobe predominant, suspicious  for pneumonia. Small left pleural effusion. 6.8 cm pleural-based mass in the anterior right hemithorax, progressive, favored to be related to lymphoma. Additional bilateral pulmonary nodules, as described above, mildly progressive and likely reflecting pulmonary lymphoma. Mediastinal lymphadenopathy, mildly progressive, possibly related to lymphoma. New 7.6 cm rounded masslike opacity in the right lower lobe, favoring round atelectasis over lymphoma. Stable right adrenal nodule favors a benign adrenal adenoma. Aortic Atherosclerosis (ICD10-I70.0). Electronically Signed   By: Julian Hy M.D.   On: 01/03/2023 19:27   DG Chest Port 1 View  Result Date: 01/03/2023 CLINICAL DATA:  Status post thoracentesis.  History of lymphoma. EXAM: PORTABLE CHEST 1 VIEW COMPARISON:  01/02/2023 FINDINGS: No pneumothorax status post thoracentesis. Stable cardiomediastinal contours. Mild diffuse interstitial edema. Progressive masslike opacity within the basilar right upper lobe is again noted and likely corresponds to the previously noted tracer avid mass noted on PET-CT from 02/12/2021 and 09/16/2021. Small right pleural effusion with overlying atelectasis is unchanged. Diffuse increase interstitial markings noted throughout the left lung. IMPRESSION: 1. No pneumothorax following thoracentesis. 2. Persistent and progressive masslike opacity within the basilar right upper lobe likely corresponds to the tracer avid mass noted on previous CT of the chest and PET-CT. A repeat contrast enhanced CT of the chest may be helpful to reassess disease status within the chest. 3. Increase interstitial markings throughout the left lung is nonspecific and may be seen with  inflammation/infection or interstitial edema. Electronically Signed   By: Kerby Moors M.D.   On: 01/03/2023 09:38   DG Abd 1 View  Result Date: 01/03/2023 CLINICAL DATA:  Abdominal distension EXAM: ABDOMEN - 1 VIEW COMPARISON:  06/26/2010 FINDINGS: Bilateral pleural effusion and lower lobe airspace opacities are again noted as seen on prior chest x-ray. Nonobstructive bowel gas pattern. No organomegaly or free air. IMPRESSION: No evidence of bowel obstruction or free air. Electronically Signed   By: Rolm Baptise M.D.   On: 01/03/2023 02:03   DG Chest Portable 1 View  Result Date: 01/02/2023 CLINICAL DATA:  Shortness of breath EXAM: PORTABLE CHEST 1 VIEW COMPARISON:  12/22/2022 FINDINGS: Cardiac shadow is at the upper limits of normal in size but stable. Aortic calcifications are noted. Persistent bibasilar airspace opacities are noted. Small bilateral pleural effusions are noted slightly worsened when compared with the prior exam. No pneumothorax is seen. No bony abnormality is noted. IMPRESSION: Bibasilar airspace opacities with associated effusions. Electronically Signed   By: Inez Catalina M.D.   On: 01/02/2023 21:16        Scheduled Meds:  sodium chloride   Intravenous Once   sodium chloride   Intravenous Once   alfuzosin  10 mg Oral Q breakfast   atorvastatin  20 mg Oral Daily   Chlorhexidine Gluconate Cloth  6 each Topical Q0600   cyanocobalamin  5,000 mcg Oral QPM   diltiazem  120 mg Oral Daily   [START ON 01/05/2023] ferrous sulfate  325 mg Oral Once per day on Mon Wed Fri   finasteride  5 mg Oral QHS   mouth rinse  15 mL Mouth Rinse 4 times per day   Continuous Infusions:  azithromycin Stopped (01/03/23 0442)   cefTRIAXone (ROCEPHIN)  IV Stopped (01/03/23 1009)     LOS: 1 day    Time spent: 35 minutes    Allina Riches Darleen Crocker, DO [facility]  If 7PM-7AM, please contact night-coverage [email or web address] 01/04/2023, 6:37 AM

## 2023-01-04 NOTE — Evaluation (Signed)
Physical Therapy Evaluation and Discharge Patient Details Name: James Sweeney MRN: [id number] DOB: 1941-07-12 Today's Date: 01/04/2023  History of Present Illness  82 y.o. male presented to the ED 11/07/22 via EMS with shortness of breath. 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 PTA patient was living with spouse in one level home with ramp to enter. His grandson was assisting pt with mobility and ADLs when pt feeling weak.  Pt currently requires min assist for bed mobility and minguard assist for sit to stand, bed to chair. After evaluation, noted MD has now discussed with family referral to Hospice and family has agreed. No further PT indicated at this time.           Recommendations for follow up therapy are one component of a multi-disciplinary discharge planning process, led by the attending physician.  Recommendations may be updated based on patient status, additional functional criteria and insurance authorization.  Follow Up Recommendations No PT follow up (Since evaluation completed pt/family have decided on home with hospice)      Assistance Recommended at Discharge Frequent or constant Supervision/Assistance  Patient can return home with the following  A little help with walking and/or transfers;A little help with bathing/dressing/bathroom;Direct supervision/assist for medications management;Direct supervision/assist for financial management;Assist for transportation;Help with stairs or ramp for entrance    Equipment Recommendations Wheelchair  (measurements PT);Hospital bed;BSC/3in1  Recommendations for Other Services       Functional Status Assessment Patient has had a recent decline in their functional status and demonstrates the ability to make significant improvements in function in a reasonable and predictable amount of time.     Precautions / Restrictions Precautions Precautions: Fall Restrictions Weight Bearing Restrictions: No      Mobility  Bed Mobility Overal bed mobility: Needs Assistance Bed Mobility: Supine to Sit     Supine to sit: Min assist, HOB elevated, +2 for safety/equipment     General bed mobility comments: able to move legs over EOB without assist; assist to raise torso to sitting    Transfers Overall transfer level: Needs assistance Equipment used: Rolling walker (2 wheels) Transfers: Sit to/from Stand, Bed to chair/wheelchair/BSC Sit to Stand: Min assist, +2 safety/equipment, From elevated surface (from ICU bed)   Step pivot transfers: Min guard, +2 safety/equipment       General transfer comment: pt easily advances legs when stepping; no imbalance with use of RW    Ambulation/Gait               General Gait Details: deferred due to on 6L [place] with sat monitor in/out with ?decr to 89% with transfer  Stairs            Wheelchair Mobility    Modified Rankin (Stroke Patients Only)       Balance                                             Pertinent Vitals/Pain Pain Assessment Pain Assessment: No/denies pain    Home Living Family/patient expects to be  discharged to:: Private residence Living Arrangements: Spouse/significant other;Children;Other relatives (grandson) Available Help at Discharge: Family;Available 24 hours/day Type of Home: House Home Access: Ramped entrance       Home Layout: One level Home Equipment: Rollator (4 wheels);BSC/3in1      Prior Function Prior Level of Function : Independent/Modified Independent              Mobility Comments: using rollator at times (when he feels weak), otherwise no device ADLs Comments: now needs help with bathing and dressing     Hand Dominance   Dominant Hand: Right    Extremity/Trunk Assessment   Upper Extremity Assessment Upper Extremity Assessment: Defer to OT evaluation    Lower Extremity Assessment Lower Extremity Assessment: Generalized weakness    Cervical / Trunk Assessment Cervical / Trunk Assessment: Kyphotic  Communication   Communication: HOH  Cognition Arousal/Alertness: Awake/alert Behavior During Therapy: WFL for tasks assessed/performed                                   General Comments: oriented to person, DOB, situation, location--not month or year        General Comments General comments (skin integrity, edema, etc.): Wife present.    Exercises     Assessment/Plan    PT Assessment Patient does not need any further PT services  PT Problem List Decreased strength;Decreased activity tolerance;Decreased balance;Decreased mobility;Decreased knowledge of use of DME;Cardiopulmonary status limiting activity       PT Treatment Interventions      PT Goals (Current goals can be found in the Care Plan section)  Acute Rehab PT Goals Patient Stated Goal: return home PT Goal Formulation: With patient/family Time For Goal Achievement: 01/18/23 Potential to Achieve Goals: Good    Frequency       Co-evaluation               AM-PAC PT "6 Clicks" Mobility  Outcome Measure Help needed turning from your back to your side while in a flat bed without using bedrails?: A Little Help needed moving from lying on your back to sitting on the side of a flat bed without using bedrails?: A Little Help needed moving to and from a bed to a chair (including a wheelchair)?: A Little Help needed standing up from a chair using your arms (e.g., wheelchair or bedside chair)?: A Little Help needed to walk in hospital room?:  Total Help needed climbing 3-5 steps with a railing? : Total 6 Click Score: 14    End of Session Equipment Utilized During Treatment: Gait belt;Oxygen Activity Tolerance: Patient tolerated treatment well Patient left: in chair;with call bell/phone within reach;with chair alarm set;with family/visitor present Nurse Communication: Mobility status PT Visit Diagnosis: Other abnormalities of gait and mobility (R26.89);Muscle weakness (generalized) (M62.81)    Time: [id number] PT Time Calculation (min) (ACUTE ONLY): 28 min   Charges:   PT Evaluation $PT Eval Low Complexity: 1 Low PT Treatments $Therapeutic Activity: 8-22 mins         Arby Barrette, PT Acute Rehabilitation Services  Office [phone]   Rexanne Mano 01/04/2023, 12:20 PM

## 2023-01-04 NOTE — IPAL (Signed)
  Interdisciplinary Goals of Care Family Meeting   Date carried out: 01/04/2023  Location of the meeting: Bedside  Member's involved: Physician, Bedside Registered Nurse, and Family Member or next of kin  Durable Power of Attorney or acting medical decision maker: Grandson    Discussion: We discussed goals of care for [employer] .  Discussed how he would rather pass at home than in the hospital.  Discussed his progressive cancer, pancytopenia, need for [facility], poor nutrition, third spacing all leading to an untreatable end of life scenario.  All agree that home with hospice services and DNR is way forward.  Primary and palliative informed.  Code status:   Code Status: Full Code   Disposition: Home with Hospice  Time spent for the meeting: Lamar, MD  01/04/2023, 9:14 AM

## 2023-01-04 NOTE — Progress Notes (Addendum)
CT reviewed: subpulmonic fluid component was very complex on bedside US and not able to drain well via thora catheter.  Only way to improve FRC on that side would be a pigtail and lytics which has high risk of hemorrhagic conversion.  Overall given progression of underlying cancer on scan, poor PO would recommend no additional interventions and transition to hospice care.  Likewise with afib and bone marrow failure, not sure the benefits of AC outweigh risks.  Happy to speak with family as needed.  Erskine Emery MD PCCM

## 2023-01-05 DIAGNOSIS — J9601 Acute respiratory failure with hypoxia: Secondary | ICD-10-CM | POA: Diagnosis not present

## 2023-01-05 DIAGNOSIS — Z515 Encounter for palliative care: Secondary | ICD-10-CM | POA: Diagnosis not present

## 2023-01-05 DIAGNOSIS — Z7189 Other specified counseling: Secondary | ICD-10-CM | POA: Diagnosis not present

## 2023-01-05 LAB — BASIC METABOLIC PANEL
Anion gap: 9 (ref 5–15)
BUN: 32 mg/dL — ABNORMAL HIGH (ref 8–23)
CO2: 23 mmol/L (ref 22–32)
Calcium: 8.1 mg/dL — ABNORMAL LOW (ref 8.9–10.3)
Chloride: 103 mmol/L (ref 98–111)
Creatinine, Ser: 1.24 mg/dL (ref 0.61–1.24)
GFR, Estimated: 58 mL/min — ABNORMAL LOW (ref >60)
Glucose, Bld: 118 mg/dL — ABNORMAL HIGH (ref 70–99)
Potassium: 3.2 mmol/L — ABNORMAL LOW (ref 3.5–5.1)
Sodium: 135 mmol/L (ref 135–145)

## 2023-01-05 LAB — CBC
HCT: 19.9 % — ABNORMAL LOW (ref 39.0–52.0)
Hemoglobin: 6.5 g/dL — CL (ref 13.0–17.0)
MCH: 35.5 pg — ABNORMAL HIGH (ref 26.0–34.0)
MCHC: 32.7 g/dL (ref 30.0–36.0)
MCV: 108.7 fL — ABNORMAL HIGH (ref 80.0–100.0)
Platelets: 110 10*3/uL — ABNORMAL LOW (ref 150–400)
RBC: 1.83 MIL/uL — ABNORMAL LOW (ref 4.22–5.81)
RDW: 23.7 % — ABNORMAL HIGH (ref 11.5–15.5)
WBC: 1.4 10*3/uL — CL (ref 4.0–10.5)
nRBC: 0 % (ref 0.0–0.2)

## 2023-01-05 LAB — TRIGLYCERIDES, BODY FLUIDS: Triglycerides, Fluid: 9 mg/dL

## 2023-01-05 LAB — RESPIRATORY PANEL BY PCR

## 2023-01-05 LAB — GLUCOSE, CAPILLARY
Glucose-Capillary: 118 mg/dL — ABNORMAL HIGH (ref 70–99)
Glucose-Capillary: 94 mg/dL (ref 70–99)
Glucose-Capillary: 110 mg/dL — ABNORMAL HIGH (ref 70–99)
Glucose-Capillary: 161 mg/dL — ABNORMAL HIGH (ref 70–99)

## 2023-01-05 LAB — HEPARIN LEVEL (UNFRACTIONATED): Heparin Unfractionated: <0.1 IU/mL — ABNORMAL LOW (ref 0.30–0.70)

## 2023-01-05 LAB — APTT: aPTT: 29 seconds (ref 24–36)

## 2023-01-05 LAB — MAGNESIUM: Magnesium: 1.8 mg/dL (ref 1.7–2.4)

## 2023-01-05 MED ORDER — POTASSIUM CHLORIDE CRYS ER 20 MEQ PO TBCR
40.0000 meq | EXTENDED_RELEASE_TABLET | Freq: Once | ORAL | Status: AC
  Administered 2023-01-05: 40 meq via ORAL
  Filled 2023-01-05: qty 2

## 2023-01-05 MED ORDER — MORPHINE SULFATE 20 MG/5ML PO SOLN: 2.5000 mg | ORAL | 0 refills | Status: DC | PRN

## 2023-01-05 NOTE — TOC Transition Note (Signed)
Transition of Care [facility]) - CM/SW Discharge Note   Patient Details  Name: James Sweeney MRN: [id number] Date of Birth: 28-Aug-1941  [facility]) CM/SW Contact:  Joanne Chars, LCSW Phone Number: 01/05/2023, 3:33 PM   Clinical Narrative:   Pt discharging to home with [facility] home hospice services.  Transport by [employer].    Final next level of care: [place] Barriers to Discharge: No Barriers Identified   Patient Goals and CMS Choice   Choice offered to / list presented to : [facility] POA / Guardian James Sweeney)  Discharge Placement                  Patient to be transferred to facility by: [place] Name of family member notified: wife and grandson in room Patient and family notified of of transfer: 01/05/23  Discharge Plan and Services Additional resources added to the After Visit Summary for   In-house Referral: Clinical Social Work   Post Acute Care Choice: Hospice                               Social Determinants of Health (SDOH) Interventions SDOH Screenings   Food Insecurity: No Food Insecurity (11/09/2022)  Housing: Low Risk  (11/09/2022)  Transportation Needs: No Transportation Needs (11/09/2022)  Utilities: Not At Risk (11/09/2022)  Tobacco Use: Medium Risk (01/02/2023)     Readmission Risk Interventions     No data to display

## 2023-01-05 NOTE — Progress Notes (Signed)
Palliative: [name] and his family have elected to discharged home with [facility] for hospice care.  No needs identified at this time.   DNR/goldenrod form on chart.  Conference with attending, bedside nursing staff, transition of care team related to patient condition, needs, disposition.  Plan: Home with [facility] for hosp[facility] care.  25 minutes James Axe, NP Palliative medicine team Team phone [phone] 50% of this time was spent counseling and coordinating care related to the above assessment and plan.

## 2023-01-05 NOTE — Progress Notes (Signed)
Patient's Hgb is 6.5. Simon Rhein, MD of [facility]. Patient's vitals are stable and pt resting comfortably in his bed. MD would like blood transfusion decision to be directed to daytime team and to hold off at this time due to palliative care consult.   Jackalyn Lombard

## 2023-01-05 NOTE — Plan of Care (Signed)
Patient is going home via [facility] for hospice management and end of life care

## 2023-01-05 NOTE — Discharge Summary (Signed)
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    Patient was admitted to [facility] team with acute on chronic hypoxemic respiratory failure due to possible community-acquired pneumonia with likely sepsis on admission.  He was also noted to have recurrent right-sided pleural effusion and has undergone thoracentesis 3/2.  He had associated atrial fibrillation with RVR and type II NSTEMI.  Palliative consulted for goals of care is overall poor prognosis noted.  He was made DNR and is now agreeable to home hospice on discharge.  This has been arranged and he is in stable condition for discharge home.  Discharge Diagnoses:  Principal Problem:   Acute respiratory failure with hypoxia (HCC) Active Problems:   Pleural effusion on right  Principal discharge diagnosis: Acute on chronic hypoxemic respiratory failure with possible community-acquired pneumonia and recurrent right pleural effusion.  Progressive cancer with pancytopenia and severe protein calorie malnutrition.  Discharge Instructions  Discharge Instructions     Diet - low sodium heart healthy   Complete by: As directed    Diet - low  sodium heart healthy   Complete by: As directed    Increase activity slowly   Complete by: As directed    Increase activity slowly   Complete by: As directed       Allergies as of 01/05/2023       Reactions   Hydrocodone-acetaminophen Other (See Comments)   Suspected cause of confusion   Losartan Potassium-hctz Nausea Only   Levaquin [levofloxacin In D5w] Nausea Only   Penicillins Hives, Other (See Comments)   Has patient had a PCN reaction causing immediate rash, facial/tongue/throat swelling, SOB or lightheadedness with hypotension: unknown, childhood allergy Has patient had a PCN reaction causing severe rash involving mucus membranes or skin necrosis: No Has patient had a PCN reaction that required hospitalization No Has patient had a PCN reaction occurring within the last 10 years: No If all of the above answers are "NO", then may proceed with Cephalosporin use.        Medication List     STOP taking these medications    atorvastatin 20 MG tablet Commonly known as: LIPITOR   furosemide 20 MG tablet Commonly known as: Lasix   valsartan 80 MG tablet Commonly known as: DIOVAN   Xarelto 20 MG Tabs tablet Generic drug: rivaroxaban       TAKE these medications    alfuzosin 10 MG 24 hr tablet Commonly known as: UROXATRAL Take 10 mg by mouth daily with breakfast.   Dilt-XR 120 MG 24 hr capsule Generic drug: diltiazem Take 120 mg by mouth daily.   finasteride 5 MG tablet Commonly known as: PROSCAR Take 5 mg by mouth at bedtime.   Fish Oil 1200 MG Caps Take 1,200 mg by mouth 3 (three) times a week.  fluticasone 50 MCG/ACT nasal spray Commonly known as: FLONASE Place 2 sprays into both nostrils daily. What changed:  when to take this reasons to take this   guaiFENesin-dextromethorphan 100-10 MG/5ML syrup Commonly known as: ROBITUSSIN DM Take 15 mLs by mouth every 4 (four) hours as needed for cough.   Iron 28 MG Tabs Take 28 mg by mouth 3 (three)  times a week.   loratadine 10 MG tablet Commonly known as: CLARITIN Take 1 tablet (10 mg total) by mouth daily.   morphine 20 MG/5ML solution Take 0.6 mLs (2.4 mg total) by mouth every 2 (two) hours as needed for pain.   OVER THE COUNTER MEDICATION Take 2 tablets by mouth daily. [place] [employer] Immune enhancement   OVER THE COUNTER MEDICATION Take 20 mLs by mouth daily. Colloidal Silver liquid for Immune booster   SUPER C COMPLEX PO Take 1 capsule by mouth every evening. Super C 1000   traMADol 50 MG tablet Commonly known as: ULTRAM Take 1 tablet (50 mg total) by mouth every 6 (six) hours as needed (pain). What changed:  how much to take reasons to take this   Vitamin B-12 5000 MCG Subl Place 5,000 mcg under the tongue every evening.   Vitamin D 125 MCG (5000 UT) Caps Take 5,000 Units by mouth daily.               Durable Medical Equipment  (From admission, onward)           Start     Ordered   01/05/23 0853  For home use only DME Hospital bed  Once       Question Answer Comment  Length of Need 6 Months   The above medical condition requires: Patient requires the ability to reposition frequently   Head must be elevated greater than: 30 degrees   Bed type Semi-electric      01/05/23 [id number]            Follow-up Information     Lois Huxley, PA. Schedule an appointment as soon as possible for a visit.   Specialty: Family Medicine Contact information: [place] [address] [phone]                Allergies  Allergen Reactions   Hydrocodone-Acetaminophen Other (See Comments)    Suspected cause of confusion   Losartan Potassium-Hctz Nausea Only   Levaquin [Levofloxacin In D5w] Nausea Only   Penicillins Hives and Other (See Comments)    Has patient had a PCN reaction causing immediate rash, facial/tongue/throat swelling, SOB or lightheadedness with hypotension: unknown, childhood allergy Has patient had a PCN  reaction causing severe rash involving mucus membranes or skin necrosis: No Has patient had a PCN reaction that required hospitalization No Has patient had a PCN reaction occurring within the last 10 years: No If all of the above answers are "NO", then may proceed with Cephalosporin use.     Consultations: PCCM Palliative care   Procedures/Studies: CT CHEST WO CONTRAST  Result Date: 01/03/2023 CLINICAL DATA:  Lymphoma.  Pleural mass. EXAM: CT CHEST WITHOUT CONTRAST TECHNIQUE: Multidetector CT imaging of the chest was performed following the standard protocol without IV contrast. RADIATION DOSE REDUCTION: This exam was performed according to the departmental dose-optimization program which includes automated exposure control, adjustment of the mA and/or kV according to patient size and/or use of iterative reconstruction technique. COMPARISON:  CT chest dated 09/16/2021.  PET-CT dated 02/12/2021. FINDINGS: Cardiovascular: Cardiomegaly. No  pericardial effusion. Hypodense blood pool relative to myocardium, suggesting anemia. No evidence of thoracic aortic aneurysm. Atherosclerotic calcifications of the aortic root/arch. Moderate three-vessel coronary atherosclerosis. Mediastinum/Nodes: Mediastinal lymphadenopathy, partially calcified, but raising concern for lymphoma in this clinical context. Dominant right paratracheal node measures 15 mm short axis (series 3/image 56) and dominant subcarinal node measures 1.9 cm (series 3/image 74), both of which are progressive from the prior. Visualized thyroid is unremarkable. Lungs/Pleura: Multifocal patchy opacities in the lungs bilaterally, left upper lobe predominant, suspicious for pneumonia. Small left pleural effusion. Moderate right pleural effusion, partially loculated with thickened rim, compatible with chronic pleural effusion/empyema. Associated trace gas related to recent thoracentesis. 6.8 x 3.4 cm pleural-based mass in the anterior right hemithorax  (series 3/image 83), with associated postsurgical changes, previously 5.6 x 1.9 cm. Adjacent 19 x 16 mm right upper lobe nodule (series 5/image 76), previously 18 x 14 mm, favoring pulmonary lymphoma. Additional 2.0 x 1.6 cm nodule in the superior segment left lower lobe (series 5/image 46), previously 2.3 x 1.5 cm, also suspicious and grossly unchanged. New 7.6 x 4.3 cm rounded masslike opacity in the right lower lobe (series 3/image 102), possibly rounded atelectasis, although additional pleural base mass is not excluded. No pneumothorax. Upper Abdomen: Visualized upper abdomen is notable for hepatic cysts, vascular calcifications, and a stable 2.4 cm right adrenal nodule which demonstrates mild hypermetabolism on prior PET and favors a benign adrenal adenoma. Musculoskeletal: Degenerative changes of the visualized thoracolumbar spine. IMPRESSION: Moderate right pleural effusion, partially loculated with thickened rim, compatible with chronic pleural effusion/empyema. Associated trace gas related to recent thoracentesis. Multifocal patchy opacities in the lungs bilaterally, left upper lobe predominant, suspicious for pneumonia. Small left pleural effusion. 6.8 cm pleural-based mass in the anterior right hemithorax, progressive, favored to be related to lymphoma. Additional bilateral pulmonary nodules, as described above, mildly progressive and likely reflecting pulmonary lymphoma. Mediastinal lymphadenopathy, mildly progressive, possibly related to lymphoma. New 7.6 cm rounded masslike opacity in the right lower lobe, favoring round atelectasis over lymphoma. Stable right adrenal nodule favors a benign adrenal adenoma. Aortic Atherosclerosis (ICD10-I70.0). Electronically Signed   By: Julian Hy M.D.   On: 01/03/2023 19:27   DG Chest Port 1 View  Result Date: 01/03/2023 CLINICAL DATA:  Status post thoracentesis.  History of lymphoma. EXAM: PORTABLE CHEST 1 VIEW COMPARISON:  01/02/2023 FINDINGS: No  pneumothorax status post thoracentesis. Stable cardiomediastinal contours. Mild diffuse interstitial edema. Progressive masslike opacity within the basilar right upper lobe is again noted and likely corresponds to the previously noted tracer avid mass noted on PET-CT from 02/12/2021 and 09/16/2021. Small right pleural effusion with overlying atelectasis is unchanged. Diffuse increase interstitial markings noted throughout the left lung. IMPRESSION: 1. No pneumothorax following thoracentesis. 2. Persistent and progressive masslike opacity within the basilar right upper lobe likely corresponds to the tracer avid mass noted on previous CT of the chest and PET-CT. A repeat contrast enhanced CT of the chest may be helpful to reassess disease status within the chest. 3. Increase interstitial markings throughout the left lung is nonspecific and may be seen with inflammation/infection or interstitial edema. Electronically Signed   By: Kerby Moors M.D.   On: 01/03/2023 09:38   DG Abd 1 View  Result Date: 01/03/2023 CLINICAL DATA:  Abdominal distension EXAM: ABDOMEN - 1 VIEW COMPARISON:  06/26/2010 FINDINGS: Bilateral pleural effusion and lower lobe airspace opacities are again noted as seen on prior chest x-ray. Nonobstructive bowel gas pattern. No organomegaly or free air. IMPRESSION:  No evidence of bowel obstruction or free air. Electronically Signed   By: Rolm Baptise M.D.   On: 01/03/2023 02:03   DG Chest Portable 1 View  Result Date: 01/02/2023 CLINICAL DATA:  Shortness of breath EXAM: PORTABLE CHEST 1 VIEW COMPARISON:  12/22/2022 FINDINGS: Cardiac shadow is at the upper limits of normal in size but stable. Aortic calcifications are noted. Persistent bibasilar airspace opacities are noted. Small bilateral pleural effusions are noted slightly worsened when compared with the prior exam. No pneumothorax is seen. No bony abnormality is noted. IMPRESSION: Bibasilar airspace opacities with associated effusions.  Electronically Signed   By: Inez Catalina M.D.   On: 01/02/2023 21:16   DG Chest 1 View  Addendum Date: 12/22/2022   ADDENDUM REPORT: 12/22/2022 17:51 ADDENDUM: Voice recognition error occurred in the first sentence of the impression. The impression should read: No pneumothorax following thoracentesis. Electronically Signed   By: Inez Catalina M.D.   On: 12/22/2022 17:51   Result Date: 12/22/2022 CLINICAL DATA:  Status post thoracentesis on the right EXAM: CHEST  1 VIEW COMPARISON:  12/10/2022 FINDINGS: Interval reduction in right-sided pleural effusion is noted when compared with the prior exam. Persistent linear density is noted in the right base as well as density in the base which may represent fluid trapped in the fissure. No pneumothorax is noted. Left lung is clear. IMPRESSION: Pneumothorax following thoracentesis. Reduction in right-sided effusion is seen although some increased density is noted which may represent fluid trapped in the major fissure. Electronically Signed: By: Inez Catalina M.D. On: 12/22/2022 17:31   IR THORACENTESIS ASP PLEURAL SPACE W/IMG GUIDE  Result Date: 12/22/2022 INDICATION: Patient with a right pleural effusion. Interventional radiology asked to perform a diagnostic and therapeutic thoracentesis. EXAM: ULTRASOUND GUIDED RIGHT THORACENTESIS MEDICATIONS: 1% lidocaine 10 mL COMPLICATIONS: None immediate. PROCEDURE: An ultrasound guided thoracentesis was thoroughly discussed with the patient and questions answered. The benefits, risks, alternatives and complications were also discussed. The patient understands and wishes to proceed with the procedure. Written consent was obtained. Ultrasound was performed to localize and mark an adequate pocket of fluid in the right chest. The area was then prepped and draped in the normal sterile fashion. 1% Lidocaine was used for local anesthesia. Under ultrasound guidance a 6 Fr Safe-T-Centesis catheter was introduced. Thoracentesis was  performed. The catheter was removed and a dressing applied. FINDINGS: A total of approximately 1.2 L of clear yellow fluid was removed. Samples were sent to the laboratory as requested by the clinical team. IMPRESSION: Successful ultrasound guided right thoracentesis yielding 1.2 L of pleural fluid. Read by: Soyla Dryer, NP Electronically Signed   By: Ruthann Cancer M.D.   On: 12/22/2022 14:20   DG Chest 2 View  Result Date: 12/11/2022 CLINICAL DATA:  Follow-up right pleural effusion. EXAM: CHEST - 2 VIEW COMPARISON:  Post right thoracentesis portable chest dated 11/07/2022. FINDINGS: Moderate-sized right pleural effusion with a more loculated component today with less associated airspace opacity. The previously demonstrated left basilar opacity has cleared with the exception of a minimal residual left pleural effusion or thickening. The cardiac silhouette remains mildly enlarged and the pulmonary vasculature mildly prominent. Significant decrease in prominence of the interstitial markings. Mild thoracic spine degenerative changes. Mildly tortuous and partially calcified thoracic aorta. IMPRESSION: 1. Moderate-sized right pleural effusion with a more loculated component today with less associated atelectasis or pneumonia. 2. Resolved left basilar atelectasis or pneumonia and pleural fluid with the exception of a minimal residual left pleural effusion  or thickening. 3. Improved changes of congestive heart failure. Electronically Signed   By: Claudie Revering M.D.   On: 12/11/2022 22:21     Discharge Exam: Vitals:   01/05/23 0700 01/05/23 0717  BP: (!) 146/63   Pulse: 80   Resp: 17   Temp:  99.1 F (37.3 C)  SpO2: 98%    Vitals:   01/05/23 0500 01/05/23 0600 01/05/23 0700 01/05/23 0717  BP: (!) 121/59 133/66 (!) 146/63   Pulse: 77 75 80   Resp: '17 17 17   '$ Temp:    99.1 F (37.3 C)  TempSrc:    Axillary  SpO2: 99% 100% 98%   Weight:      Height:        General: Pt is alert, awake, not in  acute distress Cardiovascular: RRR, S1/S2 +, no rubs, no gallops Respiratory: CTA bilaterally, no wheezing, no rhonchi Abdominal: Soft, NT, ND, bowel sounds + Extremities: no edema, no cyanosis    The results of significant diagnostics from this hospitalization (including imaging, microbiology, ancillary and laboratory) are listed below for reference.     Microbiology: Recent Results (from the past 240 hour(s))  Blood culture (routine x 2)     Status: None (Preliminary result)   Collection Time: 01/02/23  9:20 PM   Specimen: BLOOD  Result Value Ref Range Status   Specimen Description BLOOD SITE NOT SPECIFIED  Final   Special Requests   Final    BOTTLES DRAWN AEROBIC AND ANAEROBIC Blood Culture adequate volume   Culture   Final    NO GROWTH 3 DAYS Performed at [facility], [address] [place], [place] [address]    Report Status PENDING  Incomplete  Blood culture (routine x 2)     Status: None (Preliminary result)   Collection Time: 01/02/23 10:40 PM   Specimen: BLOOD  Result Value Ref Range Status   Specimen Description BLOOD SITE NOT SPECIFIED  Final   Special Requests   Final    BOTTLES DRAWN AEROBIC AND ANAEROBIC Blood Culture results may not be optimal due to an excessive volume of blood received in culture bottles   Culture   Final    NO GROWTH 3 DAYS Performed at [facility], [place] [address], [place], [place] [address]    Report Status PENDING  Incomplete  Resp panel by RT-PCR (RSV, Flu A&B, Covid) Anterior Nasal Swab     Status: None   Collection Time: 01/03/23 12:43 AM   Specimen: Anterior Nasal Swab  Result Value Ref Range Status   SARS Coronavirus 2 by RT PCR NEGATIVE NEGATIVE Final   Influenza A by PCR NEGATIVE NEGATIVE Final   Influenza B by PCR NEGATIVE NEGATIVE Final    Comment: (NOTE) The Xpert Xpress SARS-CoV-2/FLU/RSV plus assay is intended as an aid in the diagnosis of influenza from Nasopharyngeal swab specimens and should not be used  as a sole basis for treatment. Nasal washings and aspirates are unacceptable for Xpert Xpress SARS-CoV-2/FLU/RSV testing.  Fact Sheet for Patients: EntrepreneurPulse.com.au  Fact Sheet for Healthcare Providers: IncredibleEmployment.be  This test is not yet approved or cleared by the [place] FDA and has been authorized for detection and/or diagnosis of SARS-CoV-2 by FDA under an Emergency Use Authorization (EUA). This EUA will remain in effect (meaning this test can be used) for the duration of the COVID-19 declaration under Section 564(b)(1) of the Act, 21 U.S.C. section 360bbb-3(b)(1), unless the authorization is terminated or revoked.  Resp Syncytial Virus by PCR NEGATIVE NEGATIVE Final    Comment: (NOTE) Fact Sheet for Patients: EntrepreneurPulse.com.au  Fact Sheet for Healthcare Providers: IncredibleEmployment.be  This test is not yet approved or cleared by the [place] FDA and has been authorized for detection and/or diagnosis of SARS-CoV-2 by FDA under an Emergency Use Authorization (EUA). This EUA will remain in effect (meaning this test can be used) for the duration of the COVID-19 declaration under Section 564(b)(1) of the Act, 21 U.S.C. section 360bbb-3(b)(1), unless the authorization is terminated or revoked.  Performed at [facility], [place] [address], [place], [place] [address]   Culture, body fluid w Gram Stain-bottle     Status: None (Preliminary result)   Collection Time: 01/03/23  8:13 AM   Specimen: Pleura  Result Value Ref Range Status   Specimen Description PLEURAL  Final   Special Requests LEFT SIDE LUNG  Final   Gram Stain   Final    CYTOSPIN SMEAR WBC PRESENT,BOTH PMN AND MONONUCLEAR NO ORGANISMS SEEN    Culture   Final    NO GROWTH 2 DAYS Performed at [facility], [place] [address], [place], [place] [address]    Report Status PENDING  Incomplete   Body fluid culture w Gram Stain     Status: None (Preliminary result)   Collection Time: 01/03/23  8:20 AM   Specimen: Pleural Fluid  Result Value Ref Range Status   Specimen Description PLEURAL  Final   Special Requests RIGHT SIDE LUNG  Final   Gram Stain CYTOSPIN SMEAR NO ORGANISMS SEEN NO WBC SEEN   Final   Culture   Final    NO GROWTH 2 DAYS Performed at [facility], [place] [address] [place], [place] [address]    Report Status PENDING  Incomplete     Labs: BNP (last 3 results) Recent Labs    01/05/22 1715 11/07/22 1803 01/02/23 2120  BNP 216.0* 325.9* [id number]*   Basic Metabolic Panel: Recent Labs  Lab 01/02/23 2120 01/02/23 2132 01/03/23 0337 01/05/23 0131  NA 134* 138 137 135  K 4.1 4.4 4.3 3.2*  CL 100  --  103 103  CO2 21*  --  19* 23  GLUCOSE 173*  --  177* 118*  BUN 23  --  26* 32*  CREATININE 1.47*  --  1.36* 1.24  CALCIUM 8.6*  --  8.6* 8.1*  MG  --   --  1.9 1.8  PHOS  --   --  4.6  --    Liver Function Tests: Recent Labs  Lab 01/03/23 0337  AST 22  ALT 15  ALKPHOS 134*  BILITOT 1.8*  PROT 7.3  ALBUMIN 3.2*   No results for input(s): "LIPASE", "AMYLASE" in the last 168 hours. No results for input(s): "AMMONIA" in the last 168 hours. CBC: Recent Labs  Lab 01/02/23 2120 01/02/23 2132 01/03/23 0337 01/03/23 0901 01/04/23 0021 01/05/23 0131  WBC 1.7*  --  1.7* 1.6* 1.8* 1.4*  NEUTROABS 1.6*  --   --   --   --   --   HGB 5.6* 6.5* 6.3* 7.4* 7.1* 6.5*  HCT 18.0* 19.0* 19.1* 22.7* 20.3* 19.9*  MCV 120.8*  --  113.7* 109.1* 105.2* 108.7*  PLT 157  --  121* 125* 114* 110*   Cardiac Enzymes: No results for input(s): "CKTOTAL", "CKMB", "CKMBINDEX", "TROPONINI" in the last 168 hours. BNP: Invalid input(s): "POCBNP" CBG: Recent Labs  Lab 01/04/23 1525 01/04/23 1921 01/04/23  2308 01/05/23 0310 01/05/23 0716  GLUCAP 172* 207* 115* 118* 94   D-Dimer No results for input(s): "DDIMER" in the last 72 hours. Hgb A1c No results  for input(s): "HGBA1C" in the last 72 hours. Lipid Profile No results for input(s): "CHOL", "HDL", "LDLCALC", "TRIG", "CHOLHDL", "LDLDIRECT" in the last 72 hours. Thyroid function studies No results for input(s): "TSH", "T4TOTAL", "T3FREE", "THYROIDAB" in the last 72 hours.  Invalid input(s): "FREET3" Anemia work up No results for input(s): "VITAMINB12", "FOLATE", "FERRITIN", "TIBC", "IRON", "RETICCTPCT" in the last 72 hours. Urinalysis    Component Value Date/Time   COLORURINE YELLOW 01/06/2022 0005   APPEARANCEUR HAZY (A) 01/06/2022 0005   LABSPEC 1.015 01/06/2022 0005   PHURINE 5.0 01/06/2022 0005   GLUCOSEU NEGATIVE 01/06/2022 0005   HGBUR NEGATIVE 01/06/2022 0005   BILIRUBINUR NEGATIVE 01/06/2022 0005   KETONESUR NEGATIVE 01/06/2022 0005   PROTEINUR 30 (A) 01/06/2022 0005   UROBILINOGEN 0.2 09/10/2010 1141   NITRITE NEGATIVE 01/06/2022 0005   LEUKOCYTESUR NEGATIVE 01/06/2022 0005   Sepsis Labs Recent Labs  Lab 01/03/23 0337 01/03/23 0901 01/04/23 0021 01/05/23 0131  WBC 1.7* 1.6* 1.8* 1.4*   Microbiology Recent Results (from the past 240 hour(s))  Blood culture (routine x 2)     Status: None (Preliminary result)   Collection Time: 01/02/23  9:20 PM   Specimen: BLOOD  Result Value Ref Range Status   Specimen Description BLOOD SITE NOT SPECIFIED  Final   Special Requests   Final    BOTTLES DRAWN AEROBIC AND ANAEROBIC Blood Culture adequate volume   Culture   Final    NO GROWTH 3 DAYS Performed at [facility], [place] [address] [place], [place] [address]    Report Status PENDING  Incomplete  Blood culture (routine x 2)     Status: None (Preliminary result)   Collection Time: 01/02/23 10:40 PM   Specimen: BLOOD  Result Value Ref Range Status   Specimen Description BLOOD SITE NOT SPECIFIED  Final   Special Requests   Final    BOTTLES DRAWN AEROBIC AND ANAEROBIC Blood Culture results may not be optimal due to an excessive volume of blood received in culture  bottles   Culture   Final    NO GROWTH 3 DAYS Performed at [facility], [place] [address], [place], [place] [address]    Report Status PENDING  Incomplete  Resp panel by RT-PCR (RSV, Flu A&B, Covid) Anterior Nasal Swab     Status: None   Collection Time: 01/03/23 12:43 AM   Specimen: Anterior Nasal Swab  Result Value Ref Range Status   SARS Coronavirus 2 by RT PCR NEGATIVE NEGATIVE Final   Influenza A by PCR NEGATIVE NEGATIVE Final   Influenza B by PCR NEGATIVE NEGATIVE Final    Comment: (NOTE) The Xpert Xpress SARS-CoV-2/FLU/RSV plus assay is intended as an aid in the diagnosis of influenza from Nasopharyngeal swab specimens and should not be used as a sole basis for treatment. Nasal washings and aspirates are unacceptable for Xpert Xpress SARS-CoV-2/FLU/RSV testing.  Fact Sheet for Patients: EntrepreneurPulse.com.au  Fact Sheet for Healthcare Providers: IncredibleEmployment.be  This test is not yet approved or cleared by the [place] FDA and has been authorized for detection and/or diagnosis of SARS-CoV-2 by FDA under an Emergency Use Authorization (EUA). This EUA will remain in effect (meaning this test can be used) for the duration of the COVID-19 declaration under Section 564(b)(1) of the Act, 21 U.S.C. section 360bbb-3(b)(1), unless the authorization  is terminated or revoked.     Resp Syncytial Virus by PCR NEGATIVE NEGATIVE Final    Comment: (NOTE) Fact Sheet for Patients: EntrepreneurPulse.com.au  Fact Sheet for Healthcare Providers: IncredibleEmployment.be  This test is not yet approved or cleared by the [place] FDA and has been authorized for detection and/or diagnosis of SARS-CoV-2 by FDA under an Emergency Use Authorization (EUA). This EUA will remain in effect (meaning this test can be used) for the duration of the COVID-19 declaration under Section 564(b)(1) of the Act, 21  U.S.C. section 360bbb-3(b)(1), unless the authorization is terminated or revoked.  Performed at [facility], [place] [address], [place], [place] [address]   Culture, body fluid w Gram Stain-bottle     Status: None (Preliminary result)   Collection Time: 01/03/23  8:13 AM   Specimen: Pleura  Result Value Ref Range Status   Specimen Description PLEURAL  Final   Special Requests LEFT SIDE LUNG  Final   Gram Stain   Final    CYTOSPIN SMEAR WBC PRESENT,BOTH PMN AND MONONUCLEAR NO ORGANISMS SEEN    Culture   Final    NO GROWTH 2 DAYS Performed at [facility], [place] [address] [place], [place] [address]    Report Status PENDING  Incomplete  Body fluid culture w Gram Stain     Status: None (Preliminary result)   Collection Time: 01/03/23  8:20 AM   Specimen: Pleural Fluid  Result Value Ref Range Status   Specimen Description PLEURAL  Final   Special Requests RIGHT SIDE LUNG  Final   Gram Stain CYTOSPIN SMEAR NO ORGANISMS SEEN NO WBC SEEN   Final   Culture   Final    NO GROWTH 2 DAYS Performed at [facility], [place] [address] [place], [place] [address]    Report Status PENDING  Incomplete     Time coordinating discharge: 35 minutes  SIGNED:   Rodena Goldmann, DO [facility] 01/05/2023, 10:21 AM  If 7PM-7AM, please contact night-coverage [email or web address]

## 2023-01-05 NOTE — Progress Notes (Signed)
OT Cancellation Note  Patient Details Name: TIMOFEI EMBERY MRN: [id number] DOB: 04-21-1941   Cancelled Treatment:    Reason Eval/Treat Not Completed: OT screened, no needs identified, will sign off (Per chart review, pt and family have decided to transition to comfort care with plans to discharge with hospice. OT will sign off. Thank you.)  Elliot Cousin 01/05/2023, 7:44 AM

## 2023-01-05 NOTE — TOC Initial Note (Addendum)
Transition of Care [facility]) - Initial/Assessment Note    Patient Details  Name: James Sweeney MRN: [id number] Date of Birth: 1941/03/11  [facility]) CM/SW Contact:    Joanne Chars, LCSW Phone Number: 01/05/2023, 9:46 AM  Clinical Narrative:    CSW spoke with pt grandson/POA [name] who confirmed family decision to pursue home hospice with [facility].  CSW spoke with Cherie/[facility] and she will reach out to [place] and update CSW when able.                1430: TC Cherie.  Working on the referral now, unsure of DME needs, will call back.   1500: TC Cherie.  Pt can DC today.  Hospital bed ordered but family does not need tonight.  CSW spoke with pt wife and grandson in room, confirmed address.  They are ready to have pt DC today, hospital bed coming later this evening.    Expected Discharge Plan: Home w Hospice Care Barriers to Discharge: No Barriers Identified   Patient Goals and CMS Choice     Choice offered to / list presented to : [facility] POA / Guardian Morene Rankins)      Expected Discharge Plan and Services In-house Referral: Clinical Social Work   Post Acute Care Choice: Hospice Living arrangements for the past 2 months: [place]                                      Prior Living Arrangements/Services Living arrangements for the past 2 months: Single Family Home Lives with:: Spouse Patient language and need for interpreter reviewed:: No        Need for Family Participation in Patient Care: Yes (Comment) Care giver support system in place?: Yes (comment)   Criminal Activity/Legal Involvement Pertinent to Current Situation/Hospitalization: No - Comment as needed  Activities of Daily Living      Permission Sought/Granted                  Emotional Assessment              Admission diagnosis:  Hypoxia [R09.02] Acute respiratory failure with hypoxia ([place]) [J96.01] Patient Active Problem List    Diagnosis Date Noted   Pleural effusion on right 01/03/2023   Pleural effusion 11/08/2022   Atrial fibrillation with rapid ventricular response ([place]) [id number]   Acute metabolic encephalopathy [id number]   Acute hypoxemic respiratory failure ([place]) 11/07/2022   Pancytopenia ([place])    CAP (community acquired pneumonia) 01/05/2022   Anemia 01/05/2022   AKI (acute kidney injury) ([place]) 01/05/2022   Hyponatremia 01/05/2022   Acute on chronic diastolic CHF (congestive heart failure) ([place]) 01/05/2022   Cardiomegaly 01/05/2022   Acute respiratory failure with hypoxia ([place]) 01/05/2022   Acquired thrombophilia ([place]) 01/05/2022   History of hearing loss 06/21/2020   Impacted cerumen of left ear 06/21/2020   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   Counseling regarding advance care planning and goals of care 03/25/2020   Renal insufficiency 04/19/2018   Chronic anticoagulation 04/19/2018   Lung mass 04/12/2018   Permanent atrial fibrillation ([place]) 01/19/2018   Pneumothorax 01/25/2016   Pneumothorax after biopsy 01/25/2016   Coronary artery calcification seen on CT scan 01/21/2016   Murmur, cardiac 01/21/2016   Hypercholesterolemia 01/21/2016   Essential hypertension 01/09/2016   BPH (benign prostatic hyperplasia) 01/09/2016   Solitary pulmonary nodule 01/09/2016  Thrombocytopenia ([place]) 01/09/2016   Cervical spondylosis without myelopathy 12/01/2013   Lumbago 12/01/2013   DM (diabetes mellitus), type 2 ([place])    PCP:  Lois Huxley, PA Pharmacy:   [facility] - [place], [place] - [address] [place] [address] Phone: [phone] Fax: [phone]     Social Determinants of Health (SDOH) Social History: [place]: No Food Insecurity (11/09/2022)  Housing: Low Risk  (11/09/2022)  Transportation Needs: No Transportation Needs (11/09/2022)  Utilities: Not At Risk (11/09/2022)  Tobacco Use: Medium Risk  (01/02/2023)   SDOH Interventions:     Readmission Risk Interventions     No data to display

## 2023-01-06 LAB — TYPE AND SCREEN
ABO/RH(D): O POS
Antibody Screen: NEGATIVE
Unit division: 0
Unit division: 0
Unit division: 0
Unit division: 0

## 2023-01-06 LAB — BPAM RBC
Blood Product Expiration Date: 202404012359
Blood Product Expiration Date: 202404012359
Blood Product Expiration Date: 202404012359
Blood Product Expiration Date: 202404012359
ISSUE DATE / TIME: 202403020028
ISSUE DATE / TIME: 202403020247
ISSUE DATE / TIME: 202403020247
ISSUE DATE / TIME: 202403020335
Unit Type and Rh: 5100
Unit Type and Rh: 5100
Unit Type and Rh: 5100
Unit Type and Rh: 5100

## 2023-01-06 LAB — PATHOLOGIST SMEAR REVIEW

## 2023-01-06 LAB — BODY FLUID CULTURE W GRAM STAIN: Culture: NO GROWTH

## 2023-01-06 LAB — CYTOLOGY - NON PAP

## 2023-01-07 LAB — CULTURE, BLOOD (ROUTINE X 2)
Culture: NO GROWTH
Culture: NO GROWTH
Special Requests: ADEQUATE

## 2023-01-08 LAB — CULTURE, BODY FLUID W GRAM STAIN -BOTTLE: Culture: NO GROWTH

## 2023-02-09 ENCOUNTER — Ambulatory Visit: Payer: Medicare PPO | Admitting: Pulmonary Disease

## 2023-04-21 DIAGNOSIS — I2510 Atherosclerotic heart disease of native coronary artery without angina pectoris: Secondary | ICD-10-CM | POA: Diagnosis not present

## 2023-04-21 DIAGNOSIS — N1831 Chronic kidney disease, stage 3a: Secondary | ICD-10-CM | POA: Diagnosis not present

## 2023-04-21 DIAGNOSIS — E785 Hyperlipidemia, unspecified: Secondary | ICD-10-CM | POA: Diagnosis not present

## 2023-04-21 DIAGNOSIS — D6869 Other thrombophilia: Secondary | ICD-10-CM | POA: Diagnosis not present

## 2023-04-21 DIAGNOSIS — I4891 Unspecified atrial fibrillation: Secondary | ICD-10-CM | POA: Diagnosis not present

## 2023-04-21 DIAGNOSIS — I509 Heart failure, unspecified: Secondary | ICD-10-CM | POA: Diagnosis not present

## 2023-04-21 DIAGNOSIS — R011 Cardiac murmur, unspecified: Secondary | ICD-10-CM | POA: Diagnosis not present

## 2023-04-21 DIAGNOSIS — J9610 Chronic respiratory failure, unspecified whether with hypoxia or hypercapnia: Secondary | ICD-10-CM | POA: Diagnosis not present

## 2023-04-21 DIAGNOSIS — I700 Atherosclerosis of aorta: Secondary | ICD-10-CM | POA: Diagnosis not present

## 2023-08-04 DEATH — deceased
